# Patient Record
Sex: Female | Born: 1955
Health system: Southern US, Community
[De-identification: ages and names within clinical notes are randomized; demographics above are authoritative.]

## PROBLEM LIST (undated history)

## (undated) DIAGNOSIS — D649 Anemia, unspecified: Secondary | ICD-10-CM

## (undated) DIAGNOSIS — I10 Essential (primary) hypertension: Secondary | ICD-10-CM

## (undated) DIAGNOSIS — R490 Dysphonia: Secondary | ICD-10-CM

## (undated) DIAGNOSIS — E739 Lactose intolerance, unspecified: Secondary | ICD-10-CM

## (undated) DIAGNOSIS — R519 Headache, unspecified: Secondary | ICD-10-CM

## (undated) DIAGNOSIS — M4802 Spinal stenosis, cervical region: Secondary | ICD-10-CM

## (undated) DIAGNOSIS — J45909 Unspecified asthma, uncomplicated: Secondary | ICD-10-CM

## (undated) DIAGNOSIS — R7303 Prediabetes: Secondary | ICD-10-CM

## (undated) DIAGNOSIS — F419 Anxiety disorder, unspecified: Secondary | ICD-10-CM

## (undated) DIAGNOSIS — E8881 Metabolic syndrome: Secondary | ICD-10-CM

## (undated) DIAGNOSIS — R0602 Shortness of breath: Secondary | ICD-10-CM

## (undated) DIAGNOSIS — E785 Hyperlipidemia, unspecified: Secondary | ICD-10-CM

## (undated) DIAGNOSIS — M2550 Pain in unspecified joint: Secondary | ICD-10-CM

## (undated) DIAGNOSIS — E063 Autoimmune thyroiditis: Secondary | ICD-10-CM

## (undated) DIAGNOSIS — Z91018 Allergy to other foods: Secondary | ICD-10-CM

## (undated) DIAGNOSIS — M1990 Unspecified osteoarthritis, unspecified site: Secondary | ICD-10-CM

## (undated) DIAGNOSIS — M8580 Other specified disorders of bone density and structure, unspecified site: Secondary | ICD-10-CM

## (undated) DIAGNOSIS — R002 Palpitations: Secondary | ICD-10-CM

## (undated) DIAGNOSIS — K219 Gastro-esophageal reflux disease without esophagitis: Secondary | ICD-10-CM

## (undated) DIAGNOSIS — T783XXA Angioneurotic edema, initial encounter: Secondary | ICD-10-CM

## (undated) DIAGNOSIS — K5900 Constipation, unspecified: Secondary | ICD-10-CM

## (undated) DIAGNOSIS — M549 Dorsalgia, unspecified: Secondary | ICD-10-CM

## (undated) DIAGNOSIS — K056 Periodontal disease, unspecified: Secondary | ICD-10-CM

## (undated) DIAGNOSIS — K589 Irritable bowel syndrome without diarrhea: Secondary | ICD-10-CM

## (undated) DIAGNOSIS — R1310 Dysphagia, unspecified: Secondary | ICD-10-CM

## (undated) DIAGNOSIS — E669 Obesity, unspecified: Secondary | ICD-10-CM

## (undated) DIAGNOSIS — R5383 Other fatigue: Secondary | ICD-10-CM

## (undated) HISTORY — DX: Dorsalgia, unspecified: M54.9

## (undated) HISTORY — DX: Palpitations: R00.2

## (undated) HISTORY — DX: Headache, unspecified: R51.9

## (undated) HISTORY — DX: Lactose intolerance, unspecified: E73.9

## (undated) HISTORY — DX: Pain in unspecified joint: M25.50

## (undated) HISTORY — DX: Autoimmune thyroiditis: E06.3

## (undated) HISTORY — DX: Gastro-esophageal reflux disease without esophagitis: K21.9

## (undated) HISTORY — DX: Other fatigue: R53.83

## (undated) HISTORY — DX: Metabolic syndrome: E88.81

## (undated) HISTORY — PX: UPPER GI ENDOSCOPY: SHX6162

## (undated) HISTORY — DX: Other specified disorders of bone density and structure, unspecified site: M85.80

## (undated) HISTORY — DX: Constipation, unspecified: K59.00

## (undated) HISTORY — DX: Dysphagia, unspecified: R13.10

## (undated) HISTORY — DX: Allergy to other foods: Z91.018

## (undated) HISTORY — DX: Dysphonia: R49.0

## (undated) HISTORY — DX: Irritable bowel syndrome, unspecified: K58.9

## (undated) HISTORY — DX: Anemia, unspecified: D64.9

## (undated) HISTORY — DX: Angioneurotic edema, initial encounter: T78.3XXA

## (undated) HISTORY — DX: Metabolic syndrome: E88.810

## (undated) HISTORY — DX: Unspecified asthma, uncomplicated: J45.909

## (undated) HISTORY — DX: Anxiety disorder, unspecified: F41.9

## (undated) HISTORY — PX: DILATION AND CURETTAGE OF UTERUS: SHX78

## (undated) HISTORY — DX: Spinal stenosis, cervical region: M48.02

## (undated) HISTORY — DX: Shortness of breath: R06.02

## (undated) HISTORY — PX: WISDOM TOOTH EXTRACTION: SHX21

## (undated) HISTORY — DX: Prediabetes: R73.03

## (undated) HISTORY — DX: Obesity, unspecified: E66.9

## (undated) HISTORY — DX: Periodontal disease, unspecified: K05.6

## (undated) HISTORY — DX: Hyperlipidemia, unspecified: E78.5

## (undated) HISTORY — DX: Unspecified osteoarthritis, unspecified site: M19.90

---

## 1995-03-15 HISTORY — PX: CARDIAC CATHETERIZATION: SHX172

## 1998-01-13 ENCOUNTER — Ambulatory Visit (HOSPITAL_COMMUNITY): Admission: RE | Admit: 1998-01-13 | Discharge: 1998-01-13 | Payer: Self-pay | Admitting: Obstetrics & Gynecology

## 1999-03-18 ENCOUNTER — Ambulatory Visit (HOSPITAL_COMMUNITY): Admission: RE | Admit: 1999-03-18 | Discharge: 1999-03-18 | Payer: Self-pay | Admitting: Obstetrics & Gynecology

## 1999-03-18 ENCOUNTER — Encounter: Payer: Self-pay | Admitting: Obstetrics & Gynecology

## 1999-05-04 ENCOUNTER — Other Ambulatory Visit: Admission: RE | Admit: 1999-05-04 | Discharge: 1999-05-04 | Payer: Self-pay | Admitting: Obstetrics & Gynecology

## 2000-04-21 ENCOUNTER — Ambulatory Visit (HOSPITAL_COMMUNITY): Admission: RE | Admit: 2000-04-21 | Discharge: 2000-04-21 | Payer: Self-pay | Admitting: Obstetrics & Gynecology

## 2000-04-21 ENCOUNTER — Encounter: Payer: Self-pay | Admitting: Obstetrics & Gynecology

## 2000-06-01 ENCOUNTER — Other Ambulatory Visit: Admission: RE | Admit: 2000-06-01 | Discharge: 2000-06-01 | Payer: Self-pay | Admitting: Obstetrics & Gynecology

## 2000-06-02 ENCOUNTER — Ambulatory Visit (HOSPITAL_COMMUNITY): Admission: RE | Admit: 2000-06-02 | Discharge: 2000-06-02 | Payer: Self-pay | Admitting: Internal Medicine

## 2000-10-30 ENCOUNTER — Encounter: Admission: RE | Admit: 2000-10-30 | Discharge: 2000-10-30 | Payer: Self-pay | Admitting: Internal Medicine

## 2001-04-13 ENCOUNTER — Encounter: Payer: Self-pay | Admitting: Obstetrics & Gynecology

## 2001-04-13 ENCOUNTER — Ambulatory Visit (HOSPITAL_COMMUNITY): Admission: RE | Admit: 2001-04-13 | Discharge: 2001-04-13 | Payer: Self-pay | Admitting: Obstetrics & Gynecology

## 2001-05-07 ENCOUNTER — Ambulatory Visit (HOSPITAL_COMMUNITY): Admission: RE | Admit: 2001-05-07 | Discharge: 2001-05-07 | Payer: Self-pay | Admitting: Internal Medicine

## 2001-05-08 ENCOUNTER — Ambulatory Visit (HOSPITAL_COMMUNITY): Admission: RE | Admit: 2001-05-08 | Discharge: 2001-05-08 | Payer: Self-pay | Admitting: Internal Medicine

## 2001-05-08 ENCOUNTER — Encounter: Payer: Self-pay | Admitting: Internal Medicine

## 2001-09-18 ENCOUNTER — Other Ambulatory Visit: Admission: RE | Admit: 2001-09-18 | Discharge: 2001-09-18 | Payer: Self-pay | Admitting: Obstetrics & Gynecology

## 2001-11-26 ENCOUNTER — Encounter: Payer: Self-pay | Admitting: Internal Medicine

## 2001-11-26 ENCOUNTER — Encounter: Admission: RE | Admit: 2001-11-26 | Discharge: 2001-11-26 | Payer: Self-pay | Admitting: Internal Medicine

## 2002-05-22 ENCOUNTER — Ambulatory Visit (HOSPITAL_COMMUNITY): Admission: RE | Admit: 2002-05-22 | Discharge: 2002-05-22 | Payer: Self-pay | Admitting: Obstetrics & Gynecology

## 2002-05-22 ENCOUNTER — Encounter: Payer: Self-pay | Admitting: Obstetrics & Gynecology

## 2003-01-01 ENCOUNTER — Other Ambulatory Visit: Admission: RE | Admit: 2003-01-01 | Discharge: 2003-01-01 | Payer: Self-pay | Admitting: Obstetrics & Gynecology

## 2003-08-19 ENCOUNTER — Ambulatory Visit (HOSPITAL_COMMUNITY): Admission: RE | Admit: 2003-08-19 | Discharge: 2003-08-19 | Payer: Self-pay | Admitting: Obstetrics & Gynecology

## 2004-02-17 ENCOUNTER — Other Ambulatory Visit: Admission: RE | Admit: 2004-02-17 | Discharge: 2004-02-17 | Payer: Self-pay | Admitting: Obstetrics & Gynecology

## 2004-08-26 ENCOUNTER — Ambulatory Visit: Payer: Self-pay | Admitting: Family Medicine

## 2004-09-13 ENCOUNTER — Ambulatory Visit: Payer: Self-pay | Admitting: Internal Medicine

## 2005-02-01 ENCOUNTER — Ambulatory Visit (HOSPITAL_COMMUNITY): Admission: RE | Admit: 2005-02-01 | Discharge: 2005-02-01 | Payer: Self-pay | Admitting: Obstetrics & Gynecology

## 2005-03-10 ENCOUNTER — Other Ambulatory Visit: Admission: RE | Admit: 2005-03-10 | Discharge: 2005-03-10 | Payer: Self-pay | Admitting: Obstetrics & Gynecology

## 2005-03-14 HISTORY — PX: COLONOSCOPY: SHX174

## 2005-07-14 ENCOUNTER — Ambulatory Visit: Payer: Self-pay | Admitting: Internal Medicine

## 2005-08-09 ENCOUNTER — Ambulatory Visit: Payer: Self-pay | Admitting: Internal Medicine

## 2005-09-03 ENCOUNTER — Emergency Department (HOSPITAL_COMMUNITY): Admission: EM | Admit: 2005-09-03 | Discharge: 2005-09-03 | Payer: Self-pay | Admitting: Emergency Medicine

## 2006-01-21 ENCOUNTER — Ambulatory Visit: Payer: Self-pay | Admitting: Family Medicine

## 2006-01-24 ENCOUNTER — Ambulatory Visit: Payer: Self-pay | Admitting: Internal Medicine

## 2006-01-24 LAB — CONVERTED CEMR LAB
ALT: 17 units/L (ref 0–40)
Alkaline Phosphatase: 112 units/L (ref 39–117)
Basophils Relative: 0.4 % (ref 0.0–1.0)
Calcium: 9.4 mg/dL (ref 8.4–10.5)
Chloride: 103 meq/L (ref 96–112)
Creatinine, Ser: 0.7 mg/dL (ref 0.4–1.2)
Eosinophil percent: 0.1 % (ref 0.0–5.0)
Glomerular Filtration Rate, Af Am: 114 mL/min/{1.73_m2}
Glucose, Bld: 77 mg/dL (ref 70–99)
Neutrophils Relative %: 79.7 % — ABNORMAL HIGH (ref 43.0–77.0)
Platelets: 365 10*3/uL (ref 150–400)
Potassium: 3.3 meq/L — ABNORMAL LOW (ref 3.5–5.1)
RDW: 14.4 % (ref 11.5–14.6)
Total Protein: 6.9 g/dL (ref 6.0–8.3)
WBC: 10.4 10*3/uL (ref 4.5–10.5)

## 2006-02-01 ENCOUNTER — Encounter: Admission: RE | Admit: 2006-02-01 | Discharge: 2006-02-01 | Payer: Self-pay | Admitting: Internal Medicine

## 2006-02-06 ENCOUNTER — Ambulatory Visit (HOSPITAL_COMMUNITY): Admission: RE | Admit: 2006-02-06 | Discharge: 2006-02-06 | Payer: Self-pay | Admitting: Obstetrics & Gynecology

## 2006-02-17 ENCOUNTER — Ambulatory Visit: Payer: Self-pay | Admitting: Internal Medicine

## 2006-02-20 ENCOUNTER — Ambulatory Visit: Payer: Self-pay | Admitting: Internal Medicine

## 2006-04-26 ENCOUNTER — Ambulatory Visit: Payer: Self-pay | Admitting: Internal Medicine

## 2006-04-26 LAB — CONVERTED CEMR LAB
ALT: 19 units/L (ref 0–40)
AST: 24 units/L (ref 0–37)
Total CHOL/HDL Ratio: 3.7
VLDL: 35 mg/dL (ref 0–40)

## 2006-07-31 ENCOUNTER — Encounter: Payer: Self-pay | Admitting: Internal Medicine

## 2006-08-21 ENCOUNTER — Encounter: Payer: Self-pay | Admitting: Internal Medicine

## 2006-08-21 ENCOUNTER — Encounter (INDEPENDENT_AMBULATORY_CARE_PROVIDER_SITE_OTHER): Payer: Self-pay | Admitting: Gastroenterology

## 2006-08-21 ENCOUNTER — Ambulatory Visit (HOSPITAL_COMMUNITY): Admission: RE | Admit: 2006-08-21 | Discharge: 2006-08-21 | Payer: Self-pay | Admitting: Gastroenterology

## 2007-01-29 ENCOUNTER — Telehealth (INDEPENDENT_AMBULATORY_CARE_PROVIDER_SITE_OTHER): Payer: Self-pay | Admitting: *Deleted

## 2007-02-20 ENCOUNTER — Encounter: Payer: Self-pay | Admitting: Internal Medicine

## 2007-02-20 ENCOUNTER — Ambulatory Visit (HOSPITAL_COMMUNITY): Admission: RE | Admit: 2007-02-20 | Discharge: 2007-02-20 | Payer: Self-pay | Admitting: Obstetrics & Gynecology

## 2007-03-12 ENCOUNTER — Telehealth (INDEPENDENT_AMBULATORY_CARE_PROVIDER_SITE_OTHER): Payer: Self-pay | Admitting: *Deleted

## 2007-06-05 ENCOUNTER — Ambulatory Visit: Payer: Self-pay | Admitting: Internal Medicine

## 2007-06-05 DIAGNOSIS — E7800 Pure hypercholesterolemia, unspecified: Secondary | ICD-10-CM | POA: Insufficient documentation

## 2007-06-05 DIAGNOSIS — J45909 Unspecified asthma, uncomplicated: Secondary | ICD-10-CM | POA: Insufficient documentation

## 2007-06-05 DIAGNOSIS — K219 Gastro-esophageal reflux disease without esophagitis: Secondary | ICD-10-CM | POA: Insufficient documentation

## 2007-06-05 DIAGNOSIS — E785 Hyperlipidemia, unspecified: Secondary | ICD-10-CM | POA: Insufficient documentation

## 2007-06-05 LAB — CONVERTED CEMR LAB
HDL goal, serum: 40 mg/dL
LDL Goal: 160 mg/dL

## 2007-06-06 LAB — CONVERTED CEMR LAB
AST: 28 units/L (ref 0–37)
CO2: 32 meq/L (ref 19–32)
Chloride: 106 meq/L (ref 96–112)
Creatinine, Ser: 0.7 mg/dL (ref 0.4–1.2)
Eosinophils Relative: 2.1 % (ref 0.0–5.0)
GFR calc non Af Amer: 93 mL/min
Glucose, Bld: 91 mg/dL (ref 70–99)
HCT: 36.2 % (ref 36.0–46.0)
Hemoglobin: 11.8 g/dL — ABNORMAL LOW (ref 12.0–15.0)
Lymphocytes Relative: 17 % (ref 12.0–46.0)
MCHC: 32.5 g/dL (ref 30.0–36.0)
Monocytes Relative: 7.8 % (ref 3.0–11.0)
Potassium: 3.9 meq/L (ref 3.5–5.1)
RDW: 13.2 % (ref 11.5–14.6)
Total Bilirubin: 0.8 mg/dL (ref 0.3–1.2)
WBC: 5.8 10*3/uL (ref 4.5–10.5)

## 2007-06-07 ENCOUNTER — Encounter (INDEPENDENT_AMBULATORY_CARE_PROVIDER_SITE_OTHER): Payer: Self-pay | Admitting: *Deleted

## 2007-06-11 ENCOUNTER — Telehealth (INDEPENDENT_AMBULATORY_CARE_PROVIDER_SITE_OTHER): Payer: Self-pay | Admitting: *Deleted

## 2007-09-06 ENCOUNTER — Ambulatory Visit: Payer: Self-pay | Admitting: Internal Medicine

## 2007-09-15 LAB — CONVERTED CEMR LAB
Cholesterol: 191 mg/dL (ref 0–200)
HDL: 51.3 mg/dL (ref >39.0)
LDL Cholesterol: 109 mg/dL — ABNORMAL HIGH (ref 0–99)
Total CHOL/HDL Ratio: 3.7
Triglycerides: 155 mg/dL — ABNORMAL HIGH (ref 0–149)
VLDL: 31 mg/dL (ref 0–40)

## 2007-09-18 ENCOUNTER — Telehealth (INDEPENDENT_AMBULATORY_CARE_PROVIDER_SITE_OTHER): Payer: Self-pay | Admitting: *Deleted

## 2007-09-19 ENCOUNTER — Encounter (INDEPENDENT_AMBULATORY_CARE_PROVIDER_SITE_OTHER): Payer: Self-pay | Admitting: *Deleted

## 2007-09-19 ENCOUNTER — Telehealth (INDEPENDENT_AMBULATORY_CARE_PROVIDER_SITE_OTHER): Payer: Self-pay | Admitting: *Deleted

## 2007-11-01 ENCOUNTER — Ambulatory Visit: Payer: Self-pay | Admitting: Internal Medicine

## 2007-12-04 ENCOUNTER — Ambulatory Visit: Payer: Self-pay | Admitting: Internal Medicine

## 2007-12-21 ENCOUNTER — Encounter (INDEPENDENT_AMBULATORY_CARE_PROVIDER_SITE_OTHER): Payer: Self-pay | Admitting: *Deleted

## 2007-12-21 LAB — CONVERTED CEMR LAB
Triglycerides: 142 mg/dL (ref 0–149)
VLDL: 28 mg/dL (ref 0–40)

## 2008-01-07 ENCOUNTER — Telehealth (INDEPENDENT_AMBULATORY_CARE_PROVIDER_SITE_OTHER): Payer: Self-pay | Admitting: *Deleted

## 2008-01-22 ENCOUNTER — Telehealth (INDEPENDENT_AMBULATORY_CARE_PROVIDER_SITE_OTHER): Payer: Self-pay | Admitting: *Deleted

## 2008-04-17 ENCOUNTER — Ambulatory Visit (HOSPITAL_COMMUNITY): Admission: RE | Admit: 2008-04-17 | Discharge: 2008-04-17 | Payer: Self-pay | Admitting: Obstetrics & Gynecology

## 2008-05-02 ENCOUNTER — Telehealth (INDEPENDENT_AMBULATORY_CARE_PROVIDER_SITE_OTHER): Payer: Self-pay | Admitting: *Deleted

## 2008-06-19 ENCOUNTER — Telehealth (INDEPENDENT_AMBULATORY_CARE_PROVIDER_SITE_OTHER): Payer: Self-pay | Admitting: *Deleted

## 2008-08-13 ENCOUNTER — Telehealth (INDEPENDENT_AMBULATORY_CARE_PROVIDER_SITE_OTHER): Payer: Self-pay | Admitting: *Deleted

## 2008-08-26 ENCOUNTER — Ambulatory Visit: Payer: Self-pay | Admitting: Internal Medicine

## 2008-08-26 DIAGNOSIS — R030 Elevated blood-pressure reading, without diagnosis of hypertension: Secondary | ICD-10-CM | POA: Insufficient documentation

## 2008-08-30 LAB — CONVERTED CEMR LAB
AST: 28 units/L (ref 0–37)
Albumin: 3.8 g/dL (ref 3.5–5.2)
BUN: 13 mg/dL (ref 6–23)
Bilirubin, Direct: 0.1 mg/dL (ref 0.0–0.3)
CO2: 28 meq/L (ref 19–32)
Calcium: 9.3 mg/dL (ref 8.4–10.5)
Cholesterol: 166 mg/dL (ref 0–200)
Creatinine, Ser: 0.6 mg/dL (ref 0.4–1.2)
Eosinophils Absolute: 0.2 10*3/uL (ref 0.0–0.7)
HCT: 37.6 % (ref 36.0–46.0)
LDL Cholesterol: 83 mg/dL (ref 0–99)
Lymphocytes Relative: 15.5 % (ref 12.0–46.0)
Lymphs Abs: 1.1 10*3/uL (ref 0.7–4.0)
MCV: 81.6 fL (ref 78.0–100.0)
Neutro Abs: 5 10*3/uL (ref 1.4–7.7)
TSH: 2.93 microintl units/mL (ref 0.35–5.50)
Total Bilirubin: 0.8 mg/dL (ref 0.3–1.2)
Total CHOL/HDL Ratio: 3
VLDL: 30.2 mg/dL (ref 0.0–40.0)

## 2008-09-01 ENCOUNTER — Encounter (INDEPENDENT_AMBULATORY_CARE_PROVIDER_SITE_OTHER): Payer: Self-pay | Admitting: *Deleted

## 2009-05-11 ENCOUNTER — Ambulatory Visit: Payer: Self-pay | Admitting: Internal Medicine

## 2009-05-16 ENCOUNTER — Ambulatory Visit: Payer: Self-pay | Admitting: Family Medicine

## 2009-05-18 ENCOUNTER — Ambulatory Visit: Payer: Self-pay | Admitting: Internal Medicine

## 2009-05-19 ENCOUNTER — Ambulatory Visit (HOSPITAL_COMMUNITY): Admission: RE | Admit: 2009-05-19 | Discharge: 2009-05-19 | Payer: Self-pay | Admitting: Obstetrics & Gynecology

## 2009-09-04 ENCOUNTER — Telehealth (INDEPENDENT_AMBULATORY_CARE_PROVIDER_SITE_OTHER): Payer: Self-pay | Admitting: *Deleted

## 2009-09-30 ENCOUNTER — Telehealth (INDEPENDENT_AMBULATORY_CARE_PROVIDER_SITE_OTHER): Payer: Self-pay | Admitting: *Deleted

## 2009-12-02 ENCOUNTER — Ambulatory Visit: Payer: Self-pay | Admitting: Internal Medicine

## 2009-12-02 DIAGNOSIS — R059 Cough, unspecified: Secondary | ICD-10-CM | POA: Insufficient documentation

## 2009-12-02 DIAGNOSIS — R05 Cough: Secondary | ICD-10-CM

## 2009-12-02 LAB — CONVERTED CEMR LAB
Albumin: 3.9 g/dL (ref 3.5–5.2)
BUN: 14 mg/dL (ref 6–23)
Basophils Absolute: 0 10*3/uL (ref 0.0–0.1)
Bilirubin, Direct: 0.1 mg/dL (ref 0.0–0.3)
Calcium: 9.7 mg/dL (ref 8.4–10.5)
Creatinine, Ser: 0.7 mg/dL (ref 0.4–1.2)
GFR calc non Af Amer: 95.65 mL/min (ref >60)
Glucose, Bld: 92 mg/dL (ref 70–99)
HCT: 38.4 % (ref 36.0–46.0)
Hemoglobin: 12.9 g/dL (ref 12.0–15.0)
LDL Cholesterol: 86 mg/dL (ref 0–99)
Lymphocytes Relative: 16.7 % (ref 12.0–46.0)
Lymphs Abs: 1.2 10*3/uL (ref 0.7–4.0)
MCV: 84.1 fL (ref 78.0–100.0)
Monocytes Absolute: 0.6 10*3/uL (ref 0.1–1.0)
Neutro Abs: 5.4 10*3/uL (ref 1.4–7.7)
Platelets: 317 10*3/uL (ref 150.0–400.0)
TSH: 2.43 microintl units/mL (ref 0.35–5.50)
Total CHOL/HDL Ratio: 3
WBC: 7.3 10*3/uL (ref 4.5–10.5)

## 2010-04-13 NOTE — Assessment & Plan Note (Signed)
Summary: wheezing/for a week/kdc   Vital Signs:  Patient profile:   55 year old female Weight:      209.2 pounds O2 Sat:      96 % Temp:     98.4 degrees F oral Pulse rate:   81 / minute Resp:     17 per minute BP sitting:   138 / 98  (left arm) Cuff size:   large  Vitals Entered By: Shonna Chock (May 18, 2009 1:55 PM) CC: Wheezing and cough. Seen dr.Paz last Monday and Dr.Copland [facility]-symptoms still ongoing Comments REVIEWED MED LIST, PATIENT AGREED DOSE AND INSTRUCTION CORRECT    CC:  Wheezing and cough. Seen dr.Paz last Monday and Dr.Copland @ [facility]-symptoms still ongoing.  History of Present Illness: Onset 9-10 days ago after viral picture; she had Flu shot 11/2008. On second round of Prednisone ; but she has marked DOE. On Pulmicort 2 puffs once daily in am. Proventil MDI every 4 hrs as needed .RX: OTC cough syrup ,Claritin  Allergies: 1)  ! Pcn  Review of Systems General:  Complains of sweats; denies chills and fever. ENT:  Complains of nasal congestion and postnasal drainage; denies difficulty swallowing, ear discharge, earache, hoarseness, and sinus pressure; No frontal headache, facial pain or purulence. CV:  Complains of difficulty breathing at night and difficulty breathing while lying down; Dyspnea only with paroxysmal coughing . Resp:  Complains of wheezing; denies chest pain with inspiration, coughing up blood, and sputum productive. GI:  Denies indigestion. Allergy:  Denies itching eyes and sneezing.  Physical Exam  General:  in no acute distress; alert,appropriate and cooperative throughout examination Ears:  External ear exam shows no significant lesions or deformities.  Otoscopic examination reveals clear canals, tympanic membranes are intact bilaterally without bulging, retraction, inflammation or discharge. Hearing is grossly normal bilaterally. Nose:  External nasal examination shows no deformity or inflammation. Nasal mucosa are  pink and moist without lesions or exudates. Mouth:  Oral mucosa and oropharynx without lesions or exudates.  Teeth in good repair. Lungs:  Normal respiratory effort, chest expands symmetrically. Lungs : symmetric coarse rhonchi &  wheezes.Brassy , harsh cough Heart:  Normal rate and regular rhythm. S1 and S2 normal without gallop, murmur, click, rub. S4 Extremities:  No clubbing, cyanosis, edema. Skin:  Intact without suspicious lesions or rashes Cervical Nodes:  No lymphadenopathy noted Axillary Nodes:  No palpable lymphadenopathy   Impression & Recommendations:  Problem # 1:  ASTHMA, WITH ACUTE EXACERBATION (ICD-493.92)  The following medications were removed from the medication list:    Pulmicort Flexhaler 180 Mcg/act Inha (Budesonide (inhalation)) ..... Use 1 to 2 inhalations q 12 hours Her updated medication list for this problem includes:    Proventil Hfa 108 (90 Base) Mcg/act Aers (Albuterol sulfate) .Marland Kitchen... 1-2 puffs every 6 hours as needed    Prednisone 10 Mg Tabs (Prednisone) .Marland KitchenMarland KitchenMarland KitchenMarland Kitchen 4 tabs by mouth x 4 days, then 3 tabs by mouth for 2 days, then 2 tabs for 2 days, then 1 tab for 2 days    Symbicort 160-4.5 Mcg/act Aero (Budesonide-formoterol fumarate) .Marland Kitchen... 1-2 puffs every 12 hrs ; gargle & spit after use    Singulair 10 Mg Tabs (Montelukast sodium) .Marland Kitchen... 1 once daily  Problem # 2:  BRONCHITIS- ACUTE (ICD-466.0)  The following medications were removed from the medication list:    Pulmicort Flexhaler 180 Mcg/act Inha (Budesonide (inhalation)) ..... Use 1 to 2 inhalations q 12 hours    Zithromax Z-pak 250  Mg Tabs (Azithromycin) .Marland Kitchen... As directed Her updated medication list for this problem includes:    Proventil Hfa 108 (90 Base) Mcg/act Aers (Albuterol sulfate) .Marland Kitchen... 1-2 puffs every 6 hours as needed    Symbicort 160-4.5 Mcg/act Aero (Budesonide-formoterol fumarate) .Marland Kitchen... 1-2 puffs every 12 hrs ; gargle & spit after use    Singulair 10 Mg Tabs (Montelukast sodium) .Marland Kitchen... 1 once  daily    Avelox Abc Pack 400 Mg Tabs (Moxifloxacin hcl) .Marland Kitchen... 1 once daily if purulent sputum  Complete Medication List: 1)  Fluticasone Propionate 50 Mcg/act Susp (Fluticasone propionate) .Marland Kitchen.. 1 spray each nostril daily 2)  Proventil Hfa 108 (90 Base) Mcg/act Aers (Albuterol sulfate) .Marland Kitchen.. 1-2 puffs every 6 hours as needed 3)  Calcium 1200mg   .... 1 by mouth once daily 4)  Nexium 40 Mg Cpdr (Esomeprazole magnesium) .Marland Kitchen.. 1 by mouth once daily as needed 5)  Fluoxetine Hcl 10 Mg Tabs (Fluoxetine hcl) .Marland Kitchen.. 1 by mouth once daily 6)  Baby Asa  7)  Vytorin 10-20 Mg Tabs (Ezetimibe-simvastatin) .Marland Kitchen.. 1 qhs 8)  Ambien 5 Mg Tabs (Zolpidem tartrate) .Marland Kitchen.. 1 by mouth at bedtime as needed 9)  Vitamin C 500 Mg Tabs (Ascorbic acid) .Marland Kitchen.. 1 by mouth once daily 10)  Acyclovir Sodium 1000 Mg Solr (Acyclovir sodium) .Marland Kitchen.. 1 by mouth once daily as needed 11)  Loratadine 10 Mg Tabs (Loratadine) .Marland Kitchen.. 1 by mouth once daily as needed 12)  Hydrochlorothiazide 12.5 Mg Caps (Hydrochlorothiazide) .Marland Kitchen.. 1 once daily 13)  Prednisone 10 Mg Tabs (Prednisone) .... 4 tabs by mouth x 4 days, then 3 tabs by mouth for 2 days, then 2 tabs for 2 days, then 1 tab for 2 days 14)  Symbicort 160-4.5 Mcg/act Aero (Budesonide-formoterol fumarate) .Marland Kitchen.. 1-2 puffs every 12 hrs ; gargle & spit after use 15)  Singulair 10 Mg Tabs (Montelukast sodium) .Marland Kitchen.. 1 once daily 16)  Avelox Abc Pack 400 Mg Tabs (Moxifloxacin hcl) .Marland Kitchen.. 1 once daily if purulent sputum  Patient Instructions: 1)  Drink as much fluid as you can tolerate for the next few days. Prescriptions: AVELOX ABC PACK 400 MG TABS (MOXIFLOXACIN HCL) 1 once daily if purulent sputum  #5 x 0   Entered and Authorized by:   Marga Melnick MD   Signed by:   Marga Melnick MD on 05/18/2009   Method used:   Samples Given   RxID:   [id number] SINGULAIR 10 MG TABS (MONTELUKAST SODIUM) 1 once daily  #21 x 0   Entered and Authorized by:   Marga Melnick MD   Signed by:   Marga Melnick MD  on 05/18/2009   Method used:   Samples Given   RxID:   [id number] SYMBICORT 160-4.5 MCG/ACT AERO (BUDESONIDE-FORMOTEROL FUMARATE) 1-2 puffs every 12 hrs ; gargle & spit after use  #1 x 11   Entered and Authorized by:   Marga Melnick MD   Signed by:   Marga Melnick MD on 05/18/2009   Method used:   Print then Give to Patient   RxID:   [id number]

## 2010-04-13 NOTE — Progress Notes (Signed)
Summary: REFILL  Phone Note Refill Request Message from:  Fax from Pharmacy on September 30, 2009 3:46 PM  Refills Requested: Medication #1:  ALBUTEROL MDI [facility] [phone] [facility]EL [phone] NOTE FROM PHARMACY "LAST RX OVER 2 YEARS AGE NEEDS NEW RX ORDER"  Initial call taken by: Okey Regal [place],  September 30, 2009 3:47 PM  Follow-up for Phone Call        Patient has Proventil and Symbicort on med list already. Please advise. Lucious Groves CMA  September 30, 2009 3:51 PM   Additional Follow-up for Phone Call Additional follow up Details #1::        #1 albuterol MDI, RX 2 Additional Follow-up by: Marga Melnick MD,  September 30, 2009 4:33 PM    Prescriptions: PROVENTIL HFA 108 (90 BASE) MCG/ACT AERS (ALBUTEROL SULFATE) 1-2 puffs every 6 hours as needed  #1 x 2   Entered by:   Shonna Chock CMA   Authorized by:   Marga Melnick MD   Signed by:   Shonna Chock CMA on 10/01/2009   Method used:   Electronically to        [facility]  [phone]* (retail)       [address] [place], [place]  [address]       Ph: [phone] or [phone]       Fax: [phone]   RxID:   [id number]

## 2010-04-13 NOTE — Assessment & Plan Note (Signed)
Summary: FEVER,CHILLS,BODY ACHES/RH......   Vital Signs:  Patient profile:   55 year old female Height:      65 inches Weight:      209.4 pounds Temp:     99.3 degrees F BP sitting:   110 / 80  Vitals Entered By: Shary Decamp (May 11, 2009 4:02 PM) CC: acute only Comments  - sick x couple days  - chest, nasal congestion  - cough  - fever, chills, achy  - sinus pain, pressure  - using tyl, rob dm   History of Present Illness: as above  Current Medications (verified): 1)  Pulmicort Flexhaler 180 Mcg/act  Inha (Budesonide (Inhalation)) .... Use 1 To 2 Inhalations Q 12 Hours 2)  Fluticasone Propionate 50 Mcg/act Susp (Fluticasone Propionate) .Marland Kitchen.. 1 Spray Each Nostril Daily 3)  Proventil Hfa 108 (90 Base) Mcg/act Aers (Albuterol Sulfate) .Marland Kitchen.. 1-2 Puffs Every 6 Hours As Needed 4)  Calcium 1200mg  .... 1 By Mouth Once Daily 5)  Nexium 40 Mg Cpdr (Esomeprazole Magnesium) .Marland Kitchen.. 1 By Mouth Once Daily As Needed 6)  Fluoxetine Hcl 10 Mg  Tabs (Fluoxetine Hcl) .Marland Kitchen.. 1 By Mouth Once Daily 7)  Baby Asa 8)  Vytorin 10-20 Mg  Tabs (Ezetimibe-Simvastatin) .Marland Kitchen.. 1 Qhs 9)  Ambien 5 Mg Tabs (Zolpidem Tartrate) .Marland Kitchen.. 1 By Mouth At Bedtime As Needed 10)  Vitamin C 500 Mg Tabs (Ascorbic Acid) .Marland Kitchen.. 1 By Mouth Once Daily 11)  Acyclovir Sodium 1000 Mg Solr (Acyclovir Sodium) .Marland Kitchen.. 1 By Mouth Once Daily As Needed 12)  Loratadine 10 Mg Tabs (Loratadine) .Marland Kitchen.. 1 By Mouth Once Daily As Needed 13)  Hydrochlorothiazide 12.5 Mg Caps (Hydrochlorothiazide) .Marland Kitchen.. 1 Once Daily  Allergies (verified): 1)  ! Pcn  Past History:  Past Medical History: h/o  elevated alkalyne phosphatase  Asthma GERD Hyperlipidemia  Past Surgical History: Reviewed history from 08/26/2008 and no changes required. G3 P2 A1; D&C X1 endoscopy: Candida  in esophagus 1991 ; endo neg 2008  Colonoscopy 2008: neg Dr Matthias Hughs, due 2018; Oral Surgery (wisdom teeth)  Social History: Reviewed history from 08/26/2008 and no changes  required. 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   Head:  face symetric, no TTP Nose:  no congested  Mouth:  no red or d/c  Lungs:  normal respiratory effort, no intercostal retractions, and no accessory muscle use.  + ronchi B and increased exp. time    Impression & Recommendations:  Problem # 1:  ASTHMA (ICD-493.90) asthma exhacerbated by recent viral illnes see instructions  Her updated medication list for this problem includes:    Pulmicort Flexhaler 180 Mcg/act Inha (Budesonide (inhalation)) ..... Use 1 to 2 inhalations q 12 hours    Proventil Hfa 108 (90 Base) Mcg/act Aers (Albuterol sulfate) .Marland Kitchen... 1-2 puffs every 6 hours as needed    Prednisone 10 Mg Tabs (Prednisone) .Marland KitchenMarland KitchenMarland KitchenMarland Kitchen 3 by mouth once daily x 2, 2 by mouth once daily x 2 , 1 x 2  Complete Medication List: 1)  Pulmicort Flexhaler 180 Mcg/act Inha (Budesonide (inhalation)) .... Use 1 to 2 inhalations q 12 hours 2)  Fluticasone Propionate 50 Mcg/act Susp (Fluticasone propionate) .Marland Kitchen.. 1 spray each nostril daily 3)  Proventil Hfa 108 (90 Base) Mcg/act Aers (  Albuterol sulfate) .Marland Kitchen.. 1-2 puffs every 6 hours as needed 4)  Calcium 1200mg   .... 1 by mouth once daily 5)  Nexium 40 Mg Cpdr (Esomeprazole magnesium) .Marland Kitchen.. 1 by mouth once daily as needed 6)  Fluoxetine Hcl 10 Mg Tabs (Fluoxetine hcl) .Marland Kitchen.. 1 by mouth once daily 7)  Baby Asa  8)  Vytorin 10-20 Mg Tabs (Ezetimibe-simvastatin) .Marland Kitchen.. 1 qhs 9)  Ambien 5 Mg Tabs (Zolpidem tartrate) .Marland Kitchen.. 1 by mouth at bedtime as needed 10)  Vitamin C 500 Mg Tabs (Ascorbic acid) .Marland Kitchen.. 1 by mouth once daily 11)  Acyclovir Sodium 1000 Mg Solr (Acyclovir sodium) .Marland Kitchen.. 1 by mouth once daily as needed 12)  Loratadine 10 Mg Tabs (Loratadine)  .Marland Kitchen.. 1 by mouth once daily as needed 13)  Hydrochlorothiazide 12.5 Mg Caps (Hydrochlorothiazide) .Marland Kitchen.. 1 once daily 14)  Zithromax Z-pak 250 Mg Tabs (Azithromycin) .... As directed 15)  Prednisone 10 Mg Tabs (Prednisone) .... 3 by mouth once daily x 2, 2 by mouth once daily x 2 , 1 x 2  Patient Instructions: 1)  Get plenty of rest, drink lots of clear liquids, and use Tylenol 2)  Robitussin DM 3)  Zpack 4)  Prednisone for few days  5)  Return in 7-10 days if you're not better: sooner if you'er feeling worse.  Prescriptions: PREDNISONE 10 MG TABS (PREDNISONE) 3 by mouth once daily x 2, 2 by mouth once daily x 2 , 1 x 2  #12 x 0   Entered and Authorized by:   Nolon Rod. Etienne Mowers MD   Signed by:   Nolon Rod. Alvena Kiernan MD on 05/11/2009   Method used:   Electronically to        [facility]  [phone]* (retail)       [address] [place], [place]  [address]       Ph: [phone] or [phone]       Fax: [phone]   RxID:   [id number] ZITHROMAX Z-PAK 250 MG TABS (AZITHROMYCIN) as directed  #1 x 0   Entered and Authorized by:   Nolon Rod. Joeph Szatkowski MD   Signed by:   Nolon Rod. Elizbeth Posa MD on 05/11/2009   Method used:   Electronically to        [facility]  [phone]* (retail)       [address] [place], [place]  [address]       Ph: [phone] or [phone]       Fax: [phone]   RxID:   [id number]

## 2010-04-13 NOTE — Assessment & Plan Note (Signed)
Summary: cpx/lab/cbs   Vital Signs:  Patient profile:   55 year old female Height:      65 inches Weight:      208.4 pounds BMI:     34.80 Temp:     98.4 degrees F oral Pulse rate:   72 / minute Resp:     16 per minute BP sitting:   110 / 68  (left arm) Cuff size:   large  Vitals Entered By: Shonna Chock CMA (December 02, 2009 8:44 AM) CC: CPX with fasting labs , Lipid Management   CC:  CPX with fasting labs  and Lipid Management.  History of Present Illness: Dana Mccoy is here for a physical; she is essentially asymptomatic except for an intermittent cough & throat clearing in context of a "globus " sensation. She is not on an ACE-I .      She also  presents for Hyperlipidemia follow-up.  The patient denies muscle aches, GI upset, abdominal pain, flushing, itching, constipation, diarrhea, and fatigue.  Other symptoms include pedal edema with  increased salt intake.  The patient denies the following symptoms: chest pain/pressure, exercise intolerance, dypsnea, palpitations, and syncope.  Compliance with medications (by patient report) has been near 100%.  Dietary compliance has been good.  The patient reports exercising 3-4X per week.  Adjunctive measures currently used by the patient include ASA.    Lipid Management History:      Negative NCEP/ATP III risk factors include female age less than 57 years old, no history of early menopause without estrogen hormone replacement, non-diabetic, no family history for ischemic heart disease, non-tobacco-user status, non-hypertensive, no ASHD (atherosclerotic heart disease), no prior stroke/TIA, no peripheral vascular disease, and no history of aortic aneurysm.     Current Medications (verified): 1)  Fluticasone Propionate 50 Mcg/act Susp (Fluticasone Propionate) .Marland Kitchen.. 1 Spray Each Nostril Daily 2)  Proventil Hfa 108 (90 Base) Mcg/act Aers (Albuterol Sulfate) .Marland Kitchen.. 1-2 Puffs Every 6 Hours As Needed 3)  Calcium 1200mg  .... 1 By Mouth Once Daily 4)   Nexium 40 Mg Cpdr (Esomeprazole Magnesium) .Marland Kitchen.. 1 By Mouth Once Daily As Needed 5)  Fluoxetine Hcl 10 Mg  Tabs (Fluoxetine Hcl) .Marland Kitchen.. 1 By Mouth Once Daily 6)  Baby Asa 7)  Vytorin 10-20 Mg  Tabs (Ezetimibe-Simvastatin) .Marland Kitchen.. 1 Qhs 8)  Ambien 5 Mg Tabs (Zolpidem Tartrate) .Marland Kitchen.. 1 By Mouth At Bedtime As Needed 9)  Vitamin C 500 Mg Tabs (Ascorbic Acid) .... 2 By Mouth Once Daily 10)  Acyclovir Sodium 500 Mg Solr (Acyclovir Sodium) .Marland Kitchen.. 1 By Mouth Once Daily As Needed 11)  Loratadine 10 Mg Tabs (Loratadine) .Marland Kitchen.. 1 By Mouth Once Daily As Needed 12)  Hydrochlorothiazide 12.5 Mg Caps (Hydrochlorothiazide) .Marland Kitchen.. 1 Once Daily 13)  Symbicort 160-4.5 Mcg/act Aero (Budesonide-Formoterol Fumarate) .Marland Kitchen.. 1-2 Puffs Every 12 Hrs ; Gargle & Spit After Use  Allergies: 1)  ! Pcn  Past History:  Past Medical History: Elevated alkalyne phosphatase , PMH of  Asthma GERD Hyperlipidemia:[place] Study LDL goal =< 160.NMR Lipoprofile 2007: LDL 173(2380/1393), HDL 72,TG 187. LDL goal = < 100.  Past Surgical History: G3 P2 A1; D&C X1 Endoscopy: Candida  in esophagus 1991 ; Endo  2008 : gastric polyps & mild GE junction inflammtion, Dr Buccini Colonoscopy 2008: neg Dr Matthias Hughs, due 2018; Oral Surgery (wisdom teeth extraction)  Family History: mother : ? gastric cancer M uncle :? 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  The patient denies anorexia, fever, vision loss, decreased hearing, melena, hematochezia, severe indigestion/heartburn, hematuria, suspicious skin lesions, depression, unusual weight change, abnormal bleeding, enlarged lymph nodes, and angioedema.         Weight down 6#.Rare  dysphagia, 1-2 X /year with food or alcohol triggers. CV:  Denies  difficulty breathing at night and difficulty breathing while lying down; No PNDyspnea. Resp:  Denies shortness of breath, sputum productive, and wheezing; Asthma well controlled; rescue MDI used 1-2X/month. Allergy:  Complains of itching eyes and sneezing; Mild  occasional extrinsic symptoms..  Physical Exam  General:  well-nourished,alert,appropriate and cooperative throughout examination Head:  Normocephalic and atraumatic without obvious abnormalities.  Eyes:  No corneal or conjunctival inflammation noted.  Perrla. Funduscopic exam benign, without hemorrhages, exudates or papilledema.  Ears:  External ear exam shows no significant lesions or deformities.  Otoscopic examination reveals clear canals, tympanic membranes are intact bilaterally without bulging, retraction, inflammation or discharge. Hearing is grossly normal bilaterally. Nose:  External nasal examination shows no deformity or inflammation. Nasal mucosa are pink and moist without lesions or exudates. Mouth:  Oral mucosa and oropharynx without lesions or exudates.  Teeth in good repair. No pharyngeal erythema.   Neck:  No deformities, masses, or tenderness noted. Lungs:  Normal respiratory effort, chest expands symmetrically. Lungs are clear to auscultation, no crackles or wheezes. Heart:  normal rate, regular rhythm, no gallop, no rub, no JVD, no HJR, and grade 1/2-1  /6 systolic murmur @ LSB.   Abdomen:  Bowel sounds positive,abdomen soft and non-tender without masses, organomegaly or hernias noted. Genitalia:  Dr. Aldona Bar Msk:  No deformity or scoliosis noted of thoracic or lumbar spine.   Pulses:  R and L carotid,radial,dorsalis pedis and posterior tibial pulses are full and equal bilaterally Extremities:  No clubbing, cyanosis, edema, or deformity noted with normal full range of motion of all joints.   Neurologic:  alert & oriented X3 and DTRs symmetrical and normal.   Skin:  Intact without suspicious lesions or rashes Cervical  Nodes:  No lymphadenopathy noted Axillary Nodes:  No palpable lymphadenopathy Psych:  memory intact for recent and remote, normally interactive, and good eye contact.     Impression & Recommendations:  Problem # 1:  ROUTINE GENERAL MEDICAL EXAM@HEALTH  CARE FACL (ICD-V70.0)  Orders: EKG w/ Interpretation (93000) Venipuncture (16109) TLB-Lipid Panel (80061-LIPID) TLB-BMP (Basic Metabolic Panel-BMET) (80048-METABOL) TLB-CBC Platelet - w/Differential (85025-CBCD) TLB-Hepatic/Liver Function Pnl (80076-HEPATIC) TLB-TSH (Thyroid Stimulating Hormone) (84443-TSH) Specimen Handling (60454)  Problem # 2:  COUGH (ICD-786.2) ERD vs asthma variant etiology  Problem # 3:  HYPERLIPIDEMIA (ICD-272.4)  Her updated medication list for this problem includes:    Vytorin 10-20 Mg Tabs (Ezetimibe-simvastatin) .Marland Kitchen... 1 qhs  Problem # 4:  GERD (ICD-530.81)  Her updated medication list for this problem includes:    Nexium 40 Mg Cpdr (Esomeprazole magnesium) .Marland Kitchen... 1 by mouth once daily as needed  Problem # 5:  ASTHMA (ICD-493.90) Quiescent The following medications were removed from the medication list:    Prednisone 10 Mg Tabs (Prednisone) .Marland KitchenMarland KitchenMarland KitchenMarland Kitchen 4 tabs by mouth x 4 days, then 3 tabs by mouth for 2 days, then 2 tabs for 2 days, then 1 tab for 2 days    Singulair 10 Mg Tabs (Montelukast sodium) .Marland Kitchen... 1 once daily Her updated medication list for this  problem includes:    Proventil Hfa 108 (90 Base) Mcg/act Aers (Albuterol sulfate) .Marland Kitchen... 1-2 puffs every 6 hours as needed    Symbicort 160-4.5 Mcg/act Aero (Budesonide-formoterol fumarate) .Marland Kitchen... 1-2 puffs every 12 hrs ; gargle & spit after use  Complete Medication List: 1)  Fluticasone Propionate 50 Mcg/act Susp (Fluticasone propionate) .Marland Kitchen.. 1 spray each nostril daily 2)  Proventil Hfa 108 (90 Base) Mcg/act Aers (Albuterol sulfate) .Marland Kitchen.. 1-2 puffs every 6 hours as needed 3)  Calcium 1200mg   .... 1 by mouth once daily 4)  Nexium 40 Mg Cpdr (Esomeprazole  magnesium) .Marland Kitchen.. 1 by mouth once daily as needed 5)  Fluoxetine Hcl 10 Mg Tabs (Fluoxetine hcl) .Marland Kitchen.. 1 by mouth once daily 6)  Baby Asa  7)  Vytorin 10-20 Mg Tabs (Ezetimibe-simvastatin) .Marland Kitchen.. 1 qhs 8)  Ambien 5 Mg Tabs (Zolpidem tartrate) .Marland Kitchen.. 1 by mouth at bedtime as needed 9)  Vitamin C 500 Mg Tabs (Ascorbic acid) .... 2 by mouth once daily 10)  Acyclovir Sodium 500 Mg Solr (acyclovir Sodium)  .Marland Kitchen.. 1 by mouth once daily as needed 11)  Loratadine 10 Mg Tabs (Loratadine) .Marland Kitchen.. 1 by mouth once daily as needed 12)  Hydrochlorothiazide 12.5 Mg Caps (Hydrochlorothiazide) .Marland Kitchen.. 1 once daily 13)  Symbicort 160-4.5 Mcg/act Aero (Budesonide-formoterol fumarate) .Marland Kitchen.. 1-2 puffs every 12 hrs ; gargle & spit after use  Lipid Assessment/Plan:      Based on NCEP/ATP III, the patient's risk factor category is "0-1 risk factors".  The patient's lipid goals are as follows: Total cholesterol goal is 200; LDL cholesterol goal is 160; HDL cholesterol goal is 40; Triglyceride goal is 150.  Her LDL cholesterol goal has been met.    Patient Instructions: 1)  Trial of Omeprazole two times a day pre meals for 6-8 weeks for  the cough. 2)  Avoid foods high in acid (tomatoes, citrus juices, spicy foods). Avoid eating within two hours of lying down or before exercising. Do not over eat; try smaller more frequent meals. Elevate head of bed twelve inches when sleeping.

## 2010-04-13 NOTE — Assessment & Plan Note (Signed)
Summary: WHEEZING/DLO   Vital Signs:  Patient profile:   55 year old female Weight:      211 pounds Temp:     98.4 degrees F oral Pulse rate:   68 / minute Pulse rhythm:   regular BP sitting:   144 / 88  (left arm) Cuff size:   regular  Vitals Entered By: Lowella Petties CMA (May 16, 2009 10:35 AM) CC: Cough, congestion, wheezing   History of Present Illness: 55 year old:  Last week and saw Dr. Drue Novel on Monday - was gien some Zpak and on some prednisone. Continues to have bad wheezing and here for reevaluation.  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  Finished it yesterday (prednisone) and feeling a lot of wheezing.   Coughing a great deal as well  ROS: No fever, no chills, sweats, no n/v/d, no rashes, minimal arthalgias  Allergies: 1)  ! Pcn  Past History:  Past medical, surgical, family and social histories (including risk factors) reviewed, and no changes noted (except as noted below).  Past Medical History: Reviewed history from 05/11/2009 and no changes required. h/o  elevated alkalyne phosphatase  Asthma GERD Hyperlipidemia  Past Surgical History: Reviewed history from 08/26/2008 and no changes required. G3 P2 A1; D&C X1 endoscopy: Candida  in esophagus 1991 ; endo neg 2008  Colonoscopy 2008: neg Dr Matthias Hughs, due 2018; Oral Surgery (wisdom teeth)  Family History: Reviewed history from 08/26/2008 and no changes required. mother  ? gastric cancer uncle ? pancreatic cancer; Family health history  limited  Social History: Reviewed history from 08/26/2008 and no changes required. no diet  Occupation: Health and safety inspector @ [employer] Former Smoker: quit age 55 Alcohol use-yes: 1-2 X/month Regular exercise-yes  Physical Exam  General:  Well-developed,well-nourished,in no acute distress; alert,appropriate and cooperative throughout examination Head:  Normocephalic and atraumatic without obvious abnormalities. No apparent alopecia  or balding. Ears:  External ear exam shows no significant lesions or deformities.  Otoscopic examination reveals clear canals, tympanic membranes are intact bilaterally without bulging, retraction, inflammation or discharge. Hearing is grossly normal bilaterally. Nose:  no external deformity.   Mouth:  Oral mucosa and oropharynx without lesions or exudates.  Teeth in good repair. Neck:  No deformities, masses, or tenderness noted. Lungs:  no intercostal retractions and no accessory muscle use.    Diffuse wheezing throughout all lung fields without focal crackles. Heart:  Normal rate and regular rhythm. S1 and S2 normal without gallop, murmur, click, rub or other extra sounds. Extremities:  no edema Neurologic:  alert & oriented X3 and gait normal.   Cervical Nodes:  No lymphadenopathy noted Psych:  Cognition and judgment appear intact. Alert and cooperative with normal attention span and concentration. No apparent delusions, illusions, hallucinations   Impression & Recommendations:  Problem # 1:  ASTHMA, WITH ACUTE EXACERBATION (ICD-493.92) Assessment Deteriorated prolong steroid course - lungs wheezing a great deal albuterol as needed   f/u if acutely decompensating  Her updated medication list for this problem includes:    Pulmicort Flexhaler 180 Mcg/act Inha (Budesonide (inhalation)) ..... Use 1 to 2 inhalations q 12 hours    Proventil Hfa 108 (90 Base) Mcg/act Aers (Albuterol sulfate) .Marland Kitchen... 1-2 puffs every 6 hours as needed    Prednisone 10 Mg Tabs (Prednisone) .Marland KitchenMarland KitchenMarland KitchenMarland Kitchen 4 tabs by mouth x 4 days, then 3 tabs by mouth for 2 days, then 2 tabs for 2 days, then 1 tab for 2 days  Problem # 2:  BRONCHITIS- ACUTE (ICD-466.0) Z pak remains in system currently - may be viral triggering asthma flare  Her updated medication list for this problem includes:    Pulmicort Flexhaler 180 Mcg/act Inha (Budesonide (inhalation)) ..... Use 1 to 2 inhalations q 12 hours    Proventil Hfa 108 (90 Base)  Mcg/act Aers (Albuterol sulfate) .Marland Kitchen... 1-2 puffs every 6 hours as needed    Zithromax Z-pak 250 Mg Tabs (Azithromycin) .Marland Kitchen... As directed  Complete Medication List: 1)  Pulmicort Flexhaler 180 Mcg/act Inha (Budesonide (inhalation)) .... Use 1 to 2 inhalations q 12 hours 2)  Fluticasone Propionate 50 Mcg/act Susp (Fluticasone propionate) .Marland Kitchen.. 1 spray each nostril daily 3)  Proventil Hfa 108 (90 Base) Mcg/act Aers (Albuterol sulfate) .Marland Kitchen.. 1-2 puffs every 6 hours as needed 4)  Calcium 1200mg   .... 1 by mouth once daily 5)  Nexium 40 Mg Cpdr (Esomeprazole magnesium) .Marland Kitchen.. 1 by mouth once daily as needed 6)  Fluoxetine Hcl 10 Mg Tabs (Fluoxetine hcl) .Marland Kitchen.. 1 by mouth once daily 7)  Baby Asa  8)  Vytorin 10-20 Mg Tabs (Ezetimibe-simvastatin) .Marland Kitchen.. 1 qhs 9)  Ambien 5 Mg Tabs (Zolpidem tartrate) .Marland Kitchen.. 1 by mouth at bedtime as needed 10)  Vitamin C 500 Mg Tabs (Ascorbic acid) .Marland Kitchen.. 1 by mouth once daily 11)  Acyclovir Sodium 1000 Mg Solr (Acyclovir sodium) .Marland Kitchen.. 1 by mouth once daily as needed 12)  Loratadine 10 Mg Tabs (Loratadine) .Marland Kitchen.. 1 by mouth once daily as needed 13)  Hydrochlorothiazide 12.5 Mg Caps (Hydrochlorothiazide) .Marland Kitchen.. 1 once daily 14)  Zithromax Z-pak 250 Mg Tabs (Azithromycin) .... As directed 15)  Prednisone 10 Mg Tabs (Prednisone) .... 4 tabs by mouth x 4 days, then 3 tabs by mouth for 2 days, then 2 tabs for 2 days, then 1 tab for 2 days Prescriptions: PREDNISONE 10 MG TABS (PREDNISONE) 4 tabs by mouth x 4 days, then 3 tabs by mouth for 2 days, then 2 tabs for 2 days, then 1 tab for 2 days  #28 x 0   Entered and Authorized by:   Hannah Beat MD   Signed by:   Hannah Beat MD on 05/16/2009   Method used:   Electronically to        [facility]  [phone]* (retail)       [address] [place], [place]  [address]       Ph: [phone] or [phone]       Fax: [phone]   RxID:   [id number]

## 2010-04-13 NOTE — Progress Notes (Signed)
Summary: REFILL REQUEST  Phone Note Refill Request Message from:  Patient on September 04, 2009 4:59 PM  Refills Requested: Medication #1:  FLUTICASONE PROPIONATE 50 MCG/ACT SUSP 1 spray each nostril daily   Dosage confirmed as above?Dosage Confirmed   Supply Requested: 1 month   Last Ref[facility]: [facility]/26/[facility] [address]  PT IS PLANNING TO COME BY MONDAY AROUND NOON TO PICK UP SAMPLES OF VYTORIN. SHE WILL CALL BEFORE SHE COMES.  Next Appointment Scheduled: Sept.21.2011 Initial call taken by: Lavell Islam,  September 04, 2009 5:00 PM  Follow-up for Phone Call        2 Months worth of vytorin placed at the front for pick-up Follow-up by: Shonna Chock,  September 07, 2009 11:47 AM    Prescriptions: FLUTICASONE PROPIONATE 50 MCG/ACT SUSP (FLUTICASONE PROPIONATE) 1 spray each nostril daily  #3 x 1   Entered by:   Shonna Chock   Authorized by:   Marga Melnick MD   Signed by:   Shonna Chock on 09/07/2009   Method used:   Electronically to        [facility]  [phone]* (retail)       [address] [place], [place]  [address]       Ph: [phone] or [phone]       Fax: [phone]   RxID:   [id number]

## 2010-05-26 ENCOUNTER — Telehealth (INDEPENDENT_AMBULATORY_CARE_PROVIDER_SITE_OTHER): Payer: Self-pay | Admitting: *Deleted

## 2010-06-01 NOTE — Progress Notes (Signed)
Summary: Needs alternative medication   Phone Note Call from Patient Call back at Work Phone [phone]   Caller: Patient Summary of Call: Pt says that she would like to change from Vytorin is to expensive preferred alternatives are Lipitor, Caduet, Pravachol, and Zocor. Please choice one.  Initial call taken by: Doristine Devoid CMA,  May 26, 2010 5:02 PM  Follow-up for Phone Call        Change  Vytorin  10/20  to  Lipitor( atorvastatin ) 20 mg daily. Recheck fasting lipids and hepatic panel after 10 weeks. (272.4 , 995.20). Follow-up by: Marga Melnick MD,  May 27, 2010 11:32 AM  Additional Follow-up for Phone Call Additional follow up Details #1::        spoke w/ patient aware of switch and instructions....Marland KitchenMarland KitchenDoristine Devoid CMA  May 27, 2010 12:50 PM     New/Updated Medications: ATORVASTATIN CALCIUM 20 MG TABS (ATORVASTATIN CALCIUM) take one tablet at bedtime Prescriptions: ATORVASTATIN CALCIUM 20 MG TABS (ATORVASTATIN CALCIUM) take one tablet at bedtime  #90 x 1   Entered by:   Doristine Devoid CMA   Authorized by:   Marga Melnick MD   Signed by:   Doristine Devoid CMA on 05/27/2010   Method used:   Electronically to        [facility]  [phone]* (retail)       [address] [place], [place]  [address]       Ph: [phone] or [phone]       Fax: [phone]   RxID:   [id number]

## 2010-06-21 ENCOUNTER — Other Ambulatory Visit (HOSPITAL_COMMUNITY): Payer: Self-pay | Admitting: Obstetrics & Gynecology

## 2010-06-21 DIAGNOSIS — Z1231 Encounter for screening mammogram for malignant neoplasm of breast: Secondary | ICD-10-CM

## 2010-06-24 ENCOUNTER — Telehealth: Payer: Self-pay | Admitting: Internal Medicine

## 2010-06-24 DIAGNOSIS — Z780 Asymptomatic menopausal state: Secondary | ICD-10-CM

## 2010-06-24 NOTE — Telephone Encounter (Signed)
Spoke w/ pt informed she is due for bone density also informed that order will be placed.

## 2010-06-24 NOTE — Telephone Encounter (Signed)
Patient called to check to see if she is due for her bone density checkup----she says she always has it done in [facility] building on [address]

## 2010-06-24 NOTE — Telephone Encounter (Signed)
Addended by: Doristine Devoid on: 06/24/2010 11:55 AM   Modules accepted: Orders

## 2010-06-29 ENCOUNTER — Ambulatory Visit (HOSPITAL_COMMUNITY)
Admission: RE | Admit: 2010-06-29 | Discharge: 2010-06-29 | Disposition: A | Discharge status: Home / Self Care | Payer: BLUE CROSS/BLUE SHIELD | Source: Ambulatory Visit | Attending: Obstetrics & Gynecology | Admitting: Obstetrics & Gynecology

## 2010-06-29 DIAGNOSIS — Z1231 Encounter for screening mammogram for malignant neoplasm of breast: Secondary | ICD-10-CM | POA: Insufficient documentation

## 2010-07-27 ENCOUNTER — Ambulatory Visit (INDEPENDENT_AMBULATORY_CARE_PROVIDER_SITE_OTHER)
Admission: RE | Admit: 2010-07-27 | Discharge: 2010-07-27 | Disposition: A | Discharge status: Home / Self Care | Payer: BLUE CROSS/BLUE SHIELD | Source: Ambulatory Visit

## 2010-07-27 DIAGNOSIS — Z13820 Encounter for screening for osteoporosis: Secondary | ICD-10-CM

## 2010-07-27 DIAGNOSIS — Z780 Asymptomatic menopausal state: Secondary | ICD-10-CM

## 2010-07-27 NOTE — Op Note (Signed)
Dana Mccoy, WEINFELD             ACCOUNT NO.:  [id number]   MEDICAL RECORD NO.:  [id number]          PATIENT TYPE:  AMB   LOCATION:  ENDO                         FACILITY:  [facility]   PHYSICIAN:  Bernette Redbird, M.D.   DATE OF BIRTH:  09/21/1955   DATE OF PROCEDURE:  08/21/2006  DATE OF DISCHARGE:                               OPERATIVE REPORT   PROCEDURE:  Colonoscopy.   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   FINDINGS:  Mild sigmoid diverticulosis.   PROCEDURE:  The nature, purpose and risks of the procedure had been  discussed with the patient who provided written consent.  Sedation for  this procedure and the upper endoscopy which preceded it totaled  Phenergan 12.5 mg IV, fentanyl 100 mcg IV and Versed 12 mg IV without  clinical instability.  The Pentax video adult colonoscope was advanced  to the cecum with moderate difficulty due to some kinking in the sigmoid  region which was overcome by turning the patient into the supine  position.  The cecum was identified by visualization of the ileocecal  valve and appendiceal orifice and pullback was then performed.  The  quality of the prep was excellent and it was felt that all areas were  well seen.   This was a normal examination except for some mild sigmoid  diverticulosis.  No polyps, cancer, colitis or vascular malformations  were seen.  Retroflexion could not be performed in the rectum due to a  small rectal ampulla, but antegrade viewing disclosed no distal rectal  lesions.   No biopsies were obtained.  The patient tolerated the procedure well and  there were no apparent complications.   IMPRESSION:  1. Initial screening colonoscopy in a standard risk patient, without      worrisome findings (V76.51).  2. Sigmoid diverticulosis, mild.   PLAN:  Consider repeat colonoscopy in 10 years for continued screening.     ______________________________  Bernette Redbird, M.D.     RB/MEDQ  D:  08/21/2006  T:  08/21/2006  Job:  [id number]   cc:   Titus Dubin. Alwyn Ren, MD,FACP,FCCP  [phone] [place]  [place] [address]

## 2010-07-27 NOTE — Op Note (Signed)
NAMEKATRESE, SHELL             ACCOUNT NO.:  [id number]   MEDICAL RECORD NO.:  [id number]          PATIENT TYPE:  AMB   LOCATION:  ENDO                         FACILITY:  [facility]   PHYSICIAN:  Bernette Redbird, M.D.   DATE OF BIRTH:  07-11-1955   DATE OF PROCEDURE:  08/21/2006  DATE OF DISCHARGE:                               OPERATIVE REPORT   PROCEDURE:  Upper endoscopy with biopsies.   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  Her  last endoscopy 17 years ago was unrevealing.   FINDINGS:  Small hiatal hernia with possible short segment tongue of  Barrett's mucosa.  Multiple gastric polyps.   PROCEDURE:  The nature, purpose, and risks of the procedure were  familiar to the patient from prior examination, and she provided written  consent.  Sedation for this procedure was Phenergan 12.5 mg, fentanyl 75  mcg, and Versed 8 mg IV without arrhythmias or desaturation.  The Pentax  video endoscope was passed under direct vision.  The vocal cords looked  normal, specifically without evidence of reflux esophagitis.  The  esophagus was entered quite easily.  The proximal esophagus was entirely  normal.  There was a 2-cm hiatal hernia present.  There was a tongue of  what appeared to be Barrett's mucosa fairly broadly, about 1 cm or 1.5  cm across, and extending about 2 to 2.5 cm proximally up the esophagus  from what appeared to be the main portion of the Z line.  It is thus  possible that this represents some short segment Barrett's esophagus,  and at the conclusion of the procedure, several biopsies were obtained  from this area.   No ring or stricture was evident.   The stomach contained no significant residual but had multiple (dozens)  of small smooth polyps and then a few larger smooth polyps, the largest  being perhaps a centimeter or so in size.  Most of these polyps, located  along the greater curve of the midportion stomach, were about 2-4 mm in  size.  Biopsies were obtained from several of them.   No gastritis, erosions, ulcers, or overt masses were observed anywhere  in the stomach, and a retroflexed view of the cardia was unremarkable,  except for the above-mentioned polyps.  The pylorus, duodenal bulb, and  second duodenum looked completely normal.   The patient tolerated the procedure well, and there no apparent  complications.   IMPRESSION:  1. Longstanding reflux disease with possible short segment Barrett's      esophagus on this exam (530.81).  2. Multiple gastric polyps (211.1).   PLAN:  Await pathology results.  Continue current medications for now.  Consider repeat endoscopy in a couple years if intestinal metaplasia is  noted on the Barrett's segment biopsies.           ______________________________  Bernette Redbird, M.D.     RB/MEDQ  D:  08/21/2006  T:  08/21/2006  Job:  [id number]   cc:   Titus Dubin. Alwyn Ren, MD,FACP,FCCP  [phone]  [place]  [place] [address]

## 2010-07-30 NOTE — Assessment & Plan Note (Signed)
[facility]                        [place] OFFICE NOTE   Dana Mccoy                    MRN:          [id number]  DATE:01/24/2006                            DOB:          October 19, 1955    Dana Mccoy had a complete physical examination January 24, 2006 at  the age of 55.   Her active problem consistent of pain in the neck and shoulder area  after yard work. She saw Dr. Clent Mccoy on November 10 and was treated with  Flexeril and a prednisone Dosepak. She subsequently saw her chiropractor  on the 12th. She noted tingling in the right upper extremity initially  which resolved. She has pain gripping the steering wheel or with  repetitive action.   Her past medical history includes 3 pregnancies and 2 deliveries. Upper  endoscopy revealed esophageal candida. She has a history of esophageal  reflux and dyslipidemia and asthma.   Her mother may have had gastric cancer, her uncle had pancreatic cancer.   She quit smoking at age 21 and does not drink. She exercises 4 times a  week. She is on no significant diet.   Presently she is on fluoxetine 10 mg daily, Pulmicort as needed,  supplements and multivitamin. Fluticasone nasal spray, omeprazole 20 mg  daily. PENICILLIN causes itching.   Weight is stable at 207, pulse was 60 and regular, respiratory rate 14,  blood pressure 130/84. Breasts, pelvic and rectal exams are deferred to  her gynecologist. The physical was essentially negative. Specifically  there were no neuromuscular or neurologic deficits.   Her CBC, differential and CMET were normal except for a potassium of  3.3. This was felt to be related to the recent prednisone  administration. Hemoglobin A1c was 5.7 indicating an average sugar of  125 and a 14% increased vascular risk.   Restriction of white carbohydrates and all foods with high fructose corn  syrup was recommended. A NMR lipid profile revealed an LDL of 173 with  approximately 2400 total particles and approximately 1400 small dense  particles. Her triglycerides were 187 again indicating excess carbs or  sugar in her diet. I have asked her to see me to discuss recommendations  other than continuing her exercise and focusing on the carbs and sugars  in her diet.   Screening colonoscopies were recommended; she plans to call Dr. Matthias Mccoy  after March 14, 2006 to schedule this.   It was recommended that she avoid NSAIDs because of her esophageal  reflux. Celebrex 200 mg 1 twice a day for 5 days was recommended and  then 1 daily as needed was recommended.   Because of persistent symptoms, an MRI was performed on November 21.  This revealed central and foraminal stenosis most prominent from C4 to  C7 due to bony spurring, disk bulges and disk protrusions. There was no  ruptured disk. If symptoms persist then a nerve conduction velocity/EMG  should be completed. Physical therapy would also be a reasonable option.  There appears to be no neurosurgical lesion based on the NMR.   A copy of this will be sent to [facility] to facilitate  continuity of care.     Dana Mccoy. Dana Ren, MD,FACP,FCCP  Electronically Signed    [facility]/MedQ  DD: 02/07/2006  DT: 02/07/2006  Job #: [id number]   cc:   Dana Mccoy

## 2010-07-30 NOTE — Assessment & Plan Note (Signed)
[facility]                                   ON-CALL NOTE   RUBBIE, GOOSTREE                    MRN:          [id number]  DATE:01/27/2006                            DOB:          Oct 28, 1955    Phone number [phone].  Patient of Dr. Alwyn Ren.  The phone call came at 6:50  p.m.  Ms. Hilmes has been having spasms in her arm and shoulder, does  not remember any injury, was seen last Saturday and given some Flexeril, saw  Dr. Alwyn Ren this week and was given Celebrex.  She actually cannot use the  arm properly, and she has had to call in to work, and she is a Engineer, civil (consulting) at  [employer], and this has been problematic.  She is frustrated by the fact that  things have not gotten better.  It is not really worse, but she is  frustrated by it.   PLAN:  I told her that we have several options.  She could probably call Dr.  Alwyn Ren on Monday for referral for an orthopedist.  I told her that I would  be willing to see her tomorrow in the clinic, and she will call tomorrow  after 9 if she wants to have an appointment.  Otherwise she can continue the  symptomatic relief with the medications, which have helped a little bit, and  then make a decision about how she wants to proceed.    ______________________________  Karie Schwalbe, MD  Electronically Signed    RIL/MedQ  DD: 01/27/2006  DT: 01/28/2006  Job #: [id number]   cc:   Titus Dubin. Alwyn Ren, MD,FACP,FCCP

## 2010-08-05 ENCOUNTER — Other Ambulatory Visit: Payer: Self-pay | Admitting: Internal Medicine

## 2010-08-12 ENCOUNTER — Encounter: Payer: Self-pay | Admitting: Internal Medicine

## 2010-09-07 ENCOUNTER — Other Ambulatory Visit (INDEPENDENT_AMBULATORY_CARE_PROVIDER_SITE_OTHER): Payer: BC Managed Care – PPO

## 2010-09-07 DIAGNOSIS — T887XXA Unspecified adverse effect of drug or medicament, initial encounter: Secondary | ICD-10-CM

## 2010-09-07 DIAGNOSIS — E785 Hyperlipidemia, unspecified: Secondary | ICD-10-CM

## 2010-09-07 LAB — HEPATIC FUNCTION PANEL
Albumin: 4 g/dL (ref 3.5–5.2)
Alkaline Phosphatase: 116 U/L (ref 39–117)
Bilirubin, Direct: 0.1 mg/dL (ref 0.0–0.3)
Total Bilirubin: 0.7 mg/dL (ref 0.3–1.2)
Total Protein: 6.6 g/dL (ref 6.0–8.3)

## 2010-09-07 LAB — LIPID PANEL
HDL: 54 mg/dL (ref >39.00)
Triglycerides: 245 mg/dL — ABNORMAL HIGH (ref 0.0–149.0)
VLDL: 49 mg/dL — ABNORMAL HIGH (ref 0.0–40.0)

## 2010-09-07 NOTE — Progress Notes (Signed)
Labs only

## 2010-10-31 ENCOUNTER — Other Ambulatory Visit: Payer: Self-pay | Admitting: Internal Medicine

## 2010-11-01 ENCOUNTER — Other Ambulatory Visit: Payer: Self-pay | Admitting: Internal Medicine

## 2010-11-02 ENCOUNTER — Ambulatory Visit (INDEPENDENT_AMBULATORY_CARE_PROVIDER_SITE_OTHER): Payer: 59 | Admitting: Family Medicine

## 2010-11-02 ENCOUNTER — Encounter: Payer: Self-pay | Admitting: Family Medicine

## 2010-11-02 VITALS — BP 122/80 | HR 68 | Temp 98.6°F | Wt 214.4 lb

## 2010-11-02 DIAGNOSIS — S139XXA Sprain of joints and ligaments of unspecified parts of neck, initial encounter: Secondary | ICD-10-CM

## 2010-11-02 DIAGNOSIS — S161XXA Strain of muscle, fascia and tendon at neck level, initial encounter: Secondary | ICD-10-CM

## 2010-11-02 MED ORDER — PREDNISONE 10 MG PO TABS: 10.0000 mg | ORAL_TABLET | Freq: Every day | ORAL | Status: AC

## 2010-11-02 MED ORDER — TRAMADOL HCL 50 MG PO TABS: 50.0000 mg | ORAL_TABLET | Freq: Four times a day (QID) | ORAL | Status: AC | PRN

## 2010-11-02 MED ORDER — ALBUTEROL SULFATE HFA 108 (90 BASE) MCG/ACT IN AERS: INHALATION_SPRAY | RESPIRATORY_TRACT | Status: DC

## 2010-11-02 NOTE — Progress Notes (Signed)
  Subjective:     Dana Mccoy is a 55 y.o. female who presents for evaluation of neck pain. Event that precipitated these symptoms: injured while lifting a heavy box---she thinks.. Onset of symptoms was 7 week ago, and have been unchanged since that time. Current symptoms are paresthesias in R shoulder and arm. Patient denies paresthesias in hands, stiffness in shoulder and hands and weakness in arms, or neck. Patient has had recurrent self limited episodes of neck pain in the past. Previous treatments: chiropractor/manipulation.  The following portions of the patient's history were reviewed and updated as appropriate: allergies, current medications, past family history, past medical history, past social history, past surgical history and problem list.  Review of Systems Pertinent items are noted in HPI.    Objective:    BP 122/80  Pulse 68  Temp(Src) 98.6 F (37 C) (Oral)  Wt 214 lb 6.4 oz (97.251 kg)  SpO2 95% General:   alert, cooperative, appears stated age and no distress  External Deformity:  absent  ROM Cervical Spine:  normal range of motion  Midline Tenderness:  absent midline  Paraspinous tenderness:  mild on the right  UE Neurologic Exam:  unremarkable   X-ray of the cervical spine: Not indicated    Assessment:    Cervical strain    Plan:    Agricultural engineer distributed. Discussed appropriate use of ice and heat. NSAIDs per medication orders. OTC analgesics as needed. Muscle relaxants added per medication orders. Suggested chiropractic. Plain film x-rays. f/u prn

## 2010-11-02 NOTE — Telephone Encounter (Signed)
Dr.Hopper please advise 

## 2010-11-02 NOTE — Patient Instructions (Signed)
Cervical and Neck Sprain and Strain (Neck Sprain and Strain) A cervical sprain is an injury to the neck. The injury can include either over-stretching or even small tears in the ligaments that hold the bones of the neck in place. A strain affects muscles and tendons. Minor injuries usually only involve ligaments and muscles. Because the different parts of the neck are so close together, more severe injuries can involve both sprain and strain. These injuries can affect the muscles, ligaments, tendons, discs, and nerves in the neck. SYMPTOMS  Pain, soreness, stiffness, or burning sensation in the front, back, or sides of the neck. This may develop immediately after injury. Onset of discomfort may also develop slowly and not begin for 24 hours or more.   Shoulder and/or upper back pain.   Limits to the normal movement of the neck.   Headache.   Dizziness.   Weakness and/or abnormal sensation (such as numbness or tingling) of one or both arms and/or hands.   Muscle spasm.   Difficulty with swallowing or chewing.   Tenderness and swelling at the injury site.  CAUSES An injury may be the result of a direct blow or from certain habits that can lead to the symptoms noted above.  Injury from:   Contact sports (such as football, rugby, wrestling, hockey, auto racing, gymnastics, diving, martial arts, and boxing).   Motor vehicle accidents.   Whiplash injuries (see image at right). These are common. They occur when the neck is forcefully whipped or forced backward and/or forward.   Falls.   Lifestyle or awkward postures:   Cradling a telephone between the ear and shoulder.   Sitting in a chair that offers no support.   Working at an ill-designed computer station.   Activities that require hours of repeated or long periods of looking up (stretching the neck backward) or looking down (bending the head/neck forward).  DIAGNOSIS  Most of the time, your caregiver can diagnose this  problem with a careful history and examination. The history will include information about known problems (such as arthritis in the neck) or a previous neck injury. X-rays may be ordered to find out if there is a different problem. X-rays can also help to find problems with the bones of the neck not related to the injury or current symptoms. TREATMENT Several treatment options are available to help pain, spasm, and other symptoms. They include:  Cold helps relieve pain and reduce inflammation. Cold should be applied for 10 to 15 minutes every 2 to 3 hours after any activity that aggravates your symptoms. Use ice packs or an ice massage. Place a towel or cloth in between your skin and the ice pack.   Medication:   Only take over-the-counter or prescription medicines for pain, discomfort, or fever as directed by your caregiver.   Pain relievers or muscle relaxants may be prescribed. Use only as directed and only as much as you need.   Change in the activity that caused the problem. This might include using a headset with a telephone so that the phone is not propped between your ear and shoulder.   Neck collar. Your caregiver may recommend temporary use of a soft cervical collar.   Work station. Changes may be needed in your work place. A better sitting position and/or better posture during work may be part of your treatment.   Physical Therapy. Your caregiver may recommend physical therapy. This can include instructions in the use of stretching and strengthening exercises. Improvement in   posture is important. Exercises and posture training can help stabilize the neck and strengthen muscles and keep symptoms from returning.  HOME CARE INSTRUCTIONS  Other than formal physical therapy, all treatments above can be done at home. Even when not at work, it is important to be conscious of your posture and of activities that can cause a return of symptoms. Most cervical sprains and/or strains are better in  1-3 weeks. As you improve and increase activities, doing a warm up and stretching before the activity will help prevent recurrent problems. SEEK MEDICAL CARE IF:   Pain is not effectively controlled with medication.   You feel unable to decrease pain medication over time as planned.   Activity level is not improving as planned and/or expected.  SEEK IMMEDIATE MEDICAL CARE IF:   While using medication, you develop any bleeding, stomach upset, or signs of an allergic reaction.   Symptoms get worse, become intolerable, and are not helped by medications.   New, unexplained symptoms develop.   You experience numbness, tingling, weakness, or paralysis of any part of your body.  MAKE SURE YOU:   Understand these instructions.   Will watch your condition.   Will get help right away if you are not doing well or get worse.  Document Released: 12/26/2006 Document Re-Released: 05/27/2008 [employer] Patient Information 2011 [employer]. 

## 2010-11-07 ENCOUNTER — Emergency Department (HOSPITAL_COMMUNITY)
Admission: EM | Admit: 2010-11-07 | Discharge: 2010-11-07 | Disposition: A | Discharge status: Home / Self Care | Payer: 59 | Attending: Emergency Medicine | Admitting: Emergency Medicine

## 2010-11-07 DIAGNOSIS — J45909 Unspecified asthma, uncomplicated: Secondary | ICD-10-CM | POA: Insufficient documentation

## 2010-11-07 DIAGNOSIS — Z79899 Other long term (current) drug therapy: Secondary | ICD-10-CM | POA: Insufficient documentation

## 2010-11-07 DIAGNOSIS — M25519 Pain in unspecified shoulder: Secondary | ICD-10-CM | POA: Insufficient documentation

## 2010-11-07 DIAGNOSIS — M5412 Radiculopathy, cervical region: Secondary | ICD-10-CM | POA: Insufficient documentation

## 2010-11-07 DIAGNOSIS — M549 Dorsalgia, unspecified: Secondary | ICD-10-CM | POA: Insufficient documentation

## 2010-11-07 DIAGNOSIS — M542 Cervicalgia: Secondary | ICD-10-CM | POA: Insufficient documentation

## 2010-11-24 ENCOUNTER — Other Ambulatory Visit: Payer: Self-pay | Admitting: Orthopaedic Surgery

## 2010-11-24 DIAGNOSIS — M542 Cervicalgia: Secondary | ICD-10-CM

## 2010-11-27 ENCOUNTER — Other Ambulatory Visit: Payer: 59

## 2011-01-08 ENCOUNTER — Other Ambulatory Visit: Payer: Self-pay | Admitting: Internal Medicine

## 2011-01-10 ENCOUNTER — Other Ambulatory Visit: Payer: Self-pay | Admitting: Internal Medicine

## 2011-02-14 ENCOUNTER — Other Ambulatory Visit: Payer: Self-pay | Admitting: Internal Medicine

## 2011-04-18 ENCOUNTER — Encounter: Payer: Self-pay | Admitting: Family Medicine

## 2011-04-18 ENCOUNTER — Ambulatory Visit (INDEPENDENT_AMBULATORY_CARE_PROVIDER_SITE_OTHER): Payer: 59 | Admitting: Family Medicine

## 2011-04-18 VITALS — BP 140/90 | HR 68 | Temp 98.6°F | Ht 65.0 in | Wt 204.2 lb

## 2011-04-18 DIAGNOSIS — J209 Acute bronchitis, unspecified: Secondary | ICD-10-CM

## 2011-04-18 MED ORDER — AZITHROMYCIN 250 MG PO TABS: 250.0000 mg | ORAL_TABLET | Freq: Every day | ORAL | Status: AC

## 2011-04-18 MED ORDER — GUAIFENESIN-CODEINE 100-10 MG/5ML PO SYRP: 10.0000 mL | ORAL_SOLUTION | Freq: Three times a day (TID) | ORAL | Status: AC | PRN

## 2011-04-18 MED ORDER — BENZONATATE 200 MG PO CAPS: 200.0000 mg | ORAL_CAPSULE | Freq: Three times a day (TID) | ORAL | Status: AC | PRN

## 2011-04-18 NOTE — Patient Instructions (Signed)
Take the Azithromycin as directed for bronchitis Add the Guaifenesin to break up your chest congestion Use the cough pills for daytime and the syrup for night Drink plenty of fluids REST! Call with any questions or concerns Hang in there!!!

## 2011-04-18 NOTE — Assessment & Plan Note (Signed)
New.  Given duration of sxs and underlying lung dz (asthma) will start abx.  Cough meds prn.  Reviewed supportive care and red flags that should prompt return.  Pt expressed understanding and is in agreement w/ plan.

## 2011-04-18 NOTE — Progress Notes (Signed)
  Subjective:    Patient ID: Dana Mccoy, female    DOB: Sep 26, 1955, 56 y.o.   MRN: [id number]  HPI Cough- sxs started 10 days ago w/ cold sxs.  Has been using OTC cough syrup w/out relief.  Last night woke up after taking Nyquil in coughing fit- 'i couldn't get my breath'.  Son called 9-1-1, O2 sats were good, BP was elevated b/c of the coughing.  After 90 minutes, sxs improved- pt declined transport to ER.  Cough is intermittently productive.  Hx of asthma, usually well controlled.  Denies facial pain/pressure, ear pain.  + nasal congestion.  No fevers.  + sick contacts.   Review of Systems For ROS see HPI     Objective:   Physical Exam  Vitals reviewed. Constitutional: She appears well-developed and well-nourished. No distress.  HENT:  Head: Normocephalic and atraumatic.       TMs normal bilaterally Mild nasal congestion Throat w/out erythema, edema, or exudate  Eyes: Conjunctivae and EOM are normal. Pupils are equal, round, and reactive to light.  Neck: Normal range of motion. Neck supple.  Cardiovascular: Normal rate, regular rhythm, normal heart sounds and intact distal pulses.   No murmur heard. Pulmonary/Chest: Effort normal and breath sounds normal. No respiratory distress. She has no wheezes.       + hacking cough  Lymphadenopathy:    She has no cervical adenopathy.          Assessment & Plan:

## 2011-04-26 ENCOUNTER — Encounter: Payer: Self-pay | Admitting: Internal Medicine

## 2011-04-26 ENCOUNTER — Ambulatory Visit (INDEPENDENT_AMBULATORY_CARE_PROVIDER_SITE_OTHER): Payer: 59 | Admitting: Internal Medicine

## 2011-04-26 VITALS — BP 116/80 | HR 64 | Temp 97.9°F | Resp 12 | Ht 64.75 in | Wt 202.0 lb

## 2011-04-26 DIAGNOSIS — E785 Hyperlipidemia, unspecified: Secondary | ICD-10-CM

## 2011-04-26 DIAGNOSIS — M4802 Spinal stenosis, cervical region: Secondary | ICD-10-CM | POA: Insufficient documentation

## 2011-04-26 DIAGNOSIS — Z0000 Encounter for general adult medical examination without abnormal findings: Secondary | ICD-10-CM

## 2011-04-26 LAB — CBC WITH DIFFERENTIAL/PLATELET
Basophils Absolute: 0 10*3/uL (ref 0.0–0.1)
Eosinophils Relative: 2.4 % (ref 0.0–5.0)
Lymphs Abs: 1.4 10*3/uL (ref 0.7–4.0)
MCV: 90.2 fl (ref 78.0–100.0)
Monocytes Absolute: 0.6 10*3/uL (ref 0.1–1.0)
Neutrophils Relative %: 72.7 % (ref 43.0–77.0)
Platelets: 296 10*3/uL (ref 150.0–400.0)
RDW: 14 % (ref 11.5–14.6)
WBC: 8.2 10*3/uL (ref 4.5–10.5)

## 2011-04-26 NOTE — Patient Instructions (Addendum)
Preventive Health Care: Exercise  30-45  minutes a day, 3-4 days a week. Walking is especially valuable in preventing Osteoporosis. Eat a low-fat diet with lots of fruits and vegetables, up to 7-9 servings per day. Consume less than 30 grams of sugar per day from foods & drinks with High Fructose Corn Syrup as # 1,2,3 or #4 on label. Plain Mucinex for thick secretions ;force NON dairy fluids . Use a Neti pot daily as needed for sinus congestion. Flonase  1 spray in each nostril twice a day as needed. Use the "crossover" technique as discussed.Nasal cleansing in the shower as discussed. Make sure that all residual soap is removed to prevent irritation.

## 2011-04-26 NOTE — Progress Notes (Signed)
  Subjective:    Patient ID: Dana Mccoy, female    DOB: 1955/03/28, 56 y.o.   MRN: [id number]  HPI  [name]  is here for a physical;acute issues include residual head congestion related to her recent respiratory tract infection.      Review of Systems She denies symptoms of rhinosinusitis such as frontal or maxillary pain; nasal purulence;or  fever or chills. She has had some sensation of chilling recently w/o rigor. She also denies any purulent sputum. Her reactive airways are well controlled with Symbicort one inhalation daily. Rarely she has had acute temporary upper airway obstruction with meals.     Objective:   Physical Exam Gen.: Healthy and well-nourished in appearance. Alert, appropriate and cooperative throughout exam. Head: Normocephalic without obvious abnormalities Eyes: No corneal or conjunctival inflammation noted. Pupils slightly asymmetric (OD > OS) but reactive to light and accommodation. Fundal exam is benign without hemorrhages, exudate, papilledema. Extraocular motion intact. Vision grossly normal. Ears: External  ear exam reveals no significant lesions or deformities. Canals clear .TMs normal. Hearing is grossly normal bilaterally. Nose: External nasal exam reveals no deformity or inflammation. Nasal mucosa are pink and moist. No lesions or exudates noted. No septal deviation Mouth: Oral mucosa and oropharynx reveal no lesions or exudates. Teeth in good repair. Neck: No deformities, masses, or tenderness noted. Range of motion & Thyroid normal Lungs: Normal respiratory effort; chest expands symmetrically. Lungs are clear to auscultation without rales, wheezes, or increased work of breathing. Heart: Normal rate and rhythm. Normal S1 and S2. No gallop, click, or rub. S4 with slurring at  LSB ;no  murmur. Abdomen: Bowel sounds normal; abdomen soft and nontender. No masses, organomegaly or hernias noted. Genitalia:Dr Wein   .                                                                                    Musculoskeletal/extremities: No deformity or scoliosis noted of  the thoracic or lumbar spine. No clubbing, cyanosis, edema, or deformity noted. Range of motion  normal .Tone & strength  normal.Joints normal. Nail health  good. Vascular: Carotid, radial artery, dorsalis pedis and  posterior tibial pulses are full and equal. No bruits present. Neurologic: Alert and oriented x3. Deep tendon reflexes symmetrical and normal.          Skin: Intact without suspicious lesions or rashes. Lymph: No cervical, axillary lymphadenopathy present. Psych: Mood and affect are normal. Normally interactive                                                                                        Assessment & Plan:  #1 comprehensive physical exam; no acute findings #2 see Problem List with Assessments & Recommendations Plan: see Orders

## 2011-04-27 ENCOUNTER — Encounter: Payer: Self-pay | Admitting: Internal Medicine

## 2011-04-27 LAB — LIPID PANEL
Cholesterol: 165 mg/dL (ref 0–200)
HDL: 55.1 mg/dL (ref >39.00)
LDL Cholesterol: 88 mg/dL (ref 0–99)
Total CHOL/HDL Ratio: 3
Triglycerides: 111 mg/dL (ref 0.0–149.0)
VLDL: 22.2 mg/dL (ref 0.0–40.0)

## 2011-04-27 LAB — BASIC METABOLIC PANEL
BUN: 15 mg/dL (ref 6–23)
Chloride: 105 mEq/L (ref 96–112)
Creatinine, Ser: 0.8 mg/dL (ref 0.4–1.2)
GFR: 81.22 mL/min (ref >60.00)
Glucose, Bld: 80 mg/dL (ref 70–99)

## 2011-04-27 LAB — HEPATIC FUNCTION PANEL
ALT: 20 U/L (ref 0–35)
Bilirubin, Direct: 0.1 mg/dL (ref 0.0–0.3)
Total Bilirubin: 0.7 mg/dL (ref 0.3–1.2)

## 2011-05-23 ENCOUNTER — Other Ambulatory Visit: Payer: Self-pay

## 2011-05-23 MED ORDER — HYDROCHLOROTHIAZIDE 12.5 MG PO CAPS: 12.5000 mg | ORAL_CAPSULE | Freq: Every day | ORAL | Status: DC

## 2011-05-23 NOTE — Telephone Encounter (Signed)
Addended by: Lahoma Crocker on: 05/23/2011 03:02 PM   Modules accepted: Orders

## 2011-05-23 NOTE — Telephone Encounter (Signed)
Patient called requesting a 90 day supply be sent to her mail order pharmacy and a 30 day to her local pharmacy.

## 2011-05-23 NOTE — Telephone Encounter (Signed)
Prescription refilled locally

## 2011-05-24 ENCOUNTER — Encounter: Payer: 59 | Admitting: Internal Medicine

## 2011-07-14 ENCOUNTER — Other Ambulatory Visit: Payer: Self-pay | Admitting: Internal Medicine

## 2011-07-14 DIAGNOSIS — Z1231 Encounter for screening mammogram for malignant neoplasm of breast: Secondary | ICD-10-CM

## 2011-08-04 ENCOUNTER — Telehealth: Payer: Self-pay | Admitting: Internal Medicine

## 2011-08-04 MED ORDER — ATORVASTATIN CALCIUM 20 MG PO TABS: ORAL_TABLET | ORAL | Status: DC

## 2011-08-04 NOTE — Telephone Encounter (Signed)
RX sent

## 2011-08-04 NOTE — Telephone Encounter (Signed)
Pt would like 3 month supply of atorvastatin 20mg  sent to [facility]. She previously has used [facility], but would like to use [facility] for this medication now.

## 2011-08-04 NOTE — Telephone Encounter (Signed)
Addended by: Maurice Small on: 08/04/2011 11:39 AM   Modules accepted: Orders

## 2011-08-04 NOTE — Telephone Encounter (Signed)
OK 

## 2011-08-04 NOTE — Telephone Encounter (Signed)
Error. bc °

## 2011-08-09 ENCOUNTER — Other Ambulatory Visit: Payer: Self-pay | Admitting: Internal Medicine

## 2011-08-11 ENCOUNTER — Ambulatory Visit (HOSPITAL_COMMUNITY): Payer: Self-pay

## 2011-08-11 ENCOUNTER — Ambulatory Visit (HOSPITAL_COMMUNITY)
Admission: RE | Admit: 2011-08-11 | Discharge: 2011-08-11 | Disposition: A | Discharge status: Home / Self Care | Payer: 59 | Source: Ambulatory Visit | Attending: Internal Medicine | Admitting: Internal Medicine

## 2011-08-11 DIAGNOSIS — Z1231 Encounter for screening mammogram for malignant neoplasm of breast: Secondary | ICD-10-CM | POA: Insufficient documentation

## 2011-10-25 ENCOUNTER — Other Ambulatory Visit: Payer: Self-pay | Admitting: Internal Medicine

## 2011-10-25 MED ORDER — BUDESONIDE-FORMOTEROL FUMARATE 160-4.5 MCG/ACT IN AERO: INHALATION_SPRAY | RESPIRATORY_TRACT | Status: DC

## 2011-10-25 NOTE — Telephone Encounter (Signed)
SYMBICORT 160-4.5 MCG INHALER LAST FILL: 06/04/11 QTY: 10.2 GM TEN AND TWO TENTH(S) USE 1-2 PUFFS EVERY 12 HOURS GARGLE AND SPIT AFTER USE

## 2012-02-08 ENCOUNTER — Other Ambulatory Visit: Payer: Self-pay | Admitting: Internal Medicine

## 2012-04-28 ENCOUNTER — Other Ambulatory Visit: Payer: Self-pay

## 2012-06-18 ENCOUNTER — Other Ambulatory Visit: Payer: Self-pay | Admitting: Internal Medicine

## 2012-07-31 ENCOUNTER — Ambulatory Visit (INDEPENDENT_AMBULATORY_CARE_PROVIDER_SITE_OTHER): Payer: 59 | Admitting: Internal Medicine

## 2012-07-31 ENCOUNTER — Encounter: Payer: Self-pay | Admitting: Internal Medicine

## 2012-07-31 VITALS — BP 124/86 | HR 54 | Temp 98.0°F | Resp 12 | Ht 64.08 in | Wt 199.0 lb

## 2012-07-31 DIAGNOSIS — E785 Hyperlipidemia, unspecified: Secondary | ICD-10-CM

## 2012-07-31 DIAGNOSIS — R030 Elevated blood-pressure reading, without diagnosis of hypertension: Secondary | ICD-10-CM

## 2012-07-31 DIAGNOSIS — J45909 Unspecified asthma, uncomplicated: Secondary | ICD-10-CM

## 2012-07-31 DIAGNOSIS — K219 Gastro-esophageal reflux disease without esophagitis: Secondary | ICD-10-CM

## 2012-07-31 DIAGNOSIS — Z23 Encounter for immunization: Secondary | ICD-10-CM

## 2012-07-31 DIAGNOSIS — Z0000 Encounter for general adult medical examination without abnormal findings: Secondary | ICD-10-CM

## 2012-07-31 DIAGNOSIS — M4802 Spinal stenosis, cervical region: Secondary | ICD-10-CM

## 2012-07-31 LAB — BASIC METABOLIC PANEL
BUN: 16 mg/dL (ref 6–23)
CO2: 31 mEq/L (ref 19–32)
Calcium: 9.8 mg/dL (ref 8.4–10.5)
Creatinine, Ser: 0.7 mg/dL (ref 0.4–1.2)
Glucose, Bld: 82 mg/dL (ref 70–99)

## 2012-07-31 LAB — HEPATIC FUNCTION PANEL
Albumin: 3.9 g/dL (ref 3.5–5.2)
Alkaline Phosphatase: 111 U/L (ref 39–117)
Bilirubin, Direct: 0 mg/dL (ref 0.0–0.3)
Total Bilirubin: 0.4 mg/dL (ref 0.3–1.2)

## 2012-07-31 LAB — CBC WITH DIFFERENTIAL/PLATELET
Basophils Absolute: 0 10*3/uL (ref 0.0–0.1)
Eosinophils Absolute: 0.1 10*3/uL (ref 0.0–0.7)
Lymphocytes Relative: 16.2 % (ref 12.0–46.0)
MCHC: 33.1 g/dL (ref 30.0–36.0)
Neutro Abs: 5.5 10*3/uL (ref 1.4–7.7)
Neutrophils Relative %: 75 % (ref 43.0–77.0)
Platelets: 293 10*3/uL (ref 150.0–400.0)
RDW: 16.1 % — ABNORMAL HIGH (ref 11.5–14.6)

## 2012-07-31 LAB — LIPID PANEL
HDL: 52.3 mg/dL (ref >39.00)
VLDL: 22 mg/dL (ref 0.0–40.0)

## 2012-07-31 MED ORDER — ALBUTEROL SULFATE HFA 108 (90 BASE) MCG/ACT IN AERS: INHALATION_SPRAY | RESPIRATORY_TRACT | Status: DC

## 2012-07-31 MED ORDER — BUDESONIDE-FORMOTEROL FUMARATE 160-4.5 MCG/ACT IN AERO: INHALATION_SPRAY | RESPIRATORY_TRACT | Status: DC

## 2012-07-31 MED ORDER — ATORVASTATIN CALCIUM 20 MG PO TABS: ORAL_TABLET | ORAL | Status: DC

## 2012-07-31 MED ORDER — HYDROCHLOROTHIAZIDE 12.5 MG PO CAPS: ORAL_CAPSULE | ORAL | Status: DC

## 2012-07-31 NOTE — Progress Notes (Signed)
  Subjective:    Patient ID: Dana Mccoy, female    DOB: 1955-05-10, 57 y.o.   MRN: [id number]  HPI  Dana Mccoy is here for a physical;acute issues include  reflux esophagitis symptomatically improved with high dose PPI.     Review of Systems She is on a heart healthy diet; she exercises as walking & yardwork 30-40 minutes 3 times per week without symptoms. Specifically she denies chest pain, palpitations, dyspnea, or claudication. Advanced cholesterol testing reveals her LDL goal is less than 100, ideally < 70. There is medication compliance with the statin. Significant abdominal symptoms, memory deficit, or myalgias denied. .     Objective:   Physical Exam  Gen.: Healthy and well-nourished in appearance. Alert, appropriate and cooperative throughout exam. Head: Normocephalic without obvious abnormalities Eyes: No corneal or conjunctival inflammation noted.  Extraocular motion intact. Vision grossly normal with lenses Ears: External  ear exam reveals no significant lesions or deformities. Canals clear .TMs normal. Hearing is grossly normal bilaterally. Nose: External nasal exam reveals no deformity or inflammation. Nasal mucosa are pink and moist. No lesions or exudates noted.   Mouth: Oral mucosa and oropharynx reveal no lesions or exudates. Teeth in good repair. Neck: No deformities, masses, or tenderness noted. Range of motion decreased. Thyroid normal. Lungs: Normal respiratory effort; chest expands symmetrically. Lungs are clear to auscultation without rales, wheezes, or increased work of breathing. Heart: Normal rate and rhythm. Normal S1 and S2. No gallop, click, or rub.S4 w/o murmur. Abdomen: Bowel sounds normal; abdomen soft and nontender. No masses, organomegaly or hernias noted. Genitalia: As per Gyn                                  Musculoskeletal/extremities: There is some asymmetry of the posterior thoracic musculature suggesting occult scoliosis. No clubbing, cyanosis, edema,  or significant extremity  deformity noted. Range of motion normal .Tone & strength  Normal. Joints normal. Nail health good. Able to lie down & sit up w/o help. Negative SLR bilaterally to 90 degrees Vascular: Carotid, radial artery, dorsalis pedis and  posterior tibial pulses are full and equal. No bruits present. Neurologic: Alert and oriented x3. Deep tendon reflexes symmetrical and normal.       Skin: Intact without suspicious lesions or rashes. Lymph: No cervical, axillary lymphadenopathy present. Psych: Mood and affect are normal. Normally interactive                                                                                        Assessment & Plan:  #1 comprehensive physical exam; no acute findings  Plan: see Orders  & Recommendations

## 2012-07-31 NOTE — Patient Instructions (Addendum)
Preventive Health Care: Exercise  30-45  minutes a day, 3-4 days a week. Walking is especially valuable in preventing Osteoporosis. Eat a low-fat diet with lots of fruits and vegetables, up to 7-9 servings per day. This would eliminate need for vitamin supplements for most individuals. Consume less than 30 grams of sugar per day from foods & drinks with High Fructose Corn Syrup as #2,3 or #4 on label. Reflux of gastric acid may be asymptomatic as this may occur mainly during sleep.The triggers for reflux  include stress; the "aspirin family" ; alcohol; peppermint; and caffeine (coffee, tea, cola, and chocolate). The aspirin family would include aspirin and the nonsteroidal agents such as ibuprofen &  Naproxen. Tylenol would not cause reflux. If having symptoms ; food & drink should be avoided for @ least 2 hours before going to bed.  Please review the record and make any corrections; share this with all medical staff seen.

## 2012-09-02 ENCOUNTER — Other Ambulatory Visit: Payer: Self-pay | Admitting: Internal Medicine

## 2012-09-26 ENCOUNTER — Other Ambulatory Visit: Payer: Self-pay | Admitting: Internal Medicine

## 2012-09-26 DIAGNOSIS — Z1231 Encounter for screening mammogram for malignant neoplasm of breast: Secondary | ICD-10-CM

## 2012-09-27 ENCOUNTER — Ambulatory Visit (HOSPITAL_COMMUNITY)
Admission: RE | Admit: 2012-09-27 | Discharge: 2012-09-27 | Disposition: A | Discharge status: Home / Self Care | Payer: 59 | Source: Ambulatory Visit | Attending: Internal Medicine | Admitting: Internal Medicine

## 2012-09-27 DIAGNOSIS — Z1231 Encounter for screening mammogram for malignant neoplasm of breast: Secondary | ICD-10-CM

## 2012-11-01 ENCOUNTER — Telehealth: Payer: Self-pay

## 2012-11-01 MED ORDER — FLUTICASONE-SALMETEROL 250-50 MCG/DOSE IN AEPB: 1.0000 | INHALATION_SPRAY | Freq: Two times a day (BID) | RESPIRATORY_TRACT | Status: DC

## 2012-11-01 NOTE — Telephone Encounter (Signed)
Message left on triage voicemail: Patient is currently using Symbicort and was recently informed that this is no longer covered by insurance. Alternatives include Advair 250/50 and Dulera 200/5. Pharmacy [facility]P is out of the office, will send to covering MD (Dr.Tabori)  Last OV 07/31/2012 (Annual Exam)

## 2012-11-01 NOTE — Telephone Encounter (Signed)
RX sent, medication list updated to reflect mediation change

## 2012-11-01 NOTE — Telephone Encounter (Signed)
Ok to switch to Advair 250/50- 1 puff BID

## 2012-11-01 NOTE — Telephone Encounter (Signed)
Message left on triage voicemail: Insurance no longer covers symbicort. Alternatives include Advair 250/50 or Dulera 200/5

## 2013-01-17 ENCOUNTER — Other Ambulatory Visit: Payer: Self-pay

## 2013-03-09 ENCOUNTER — Other Ambulatory Visit: Payer: Self-pay | Admitting: Internal Medicine

## 2013-03-10 ENCOUNTER — Other Ambulatory Visit: Payer: Self-pay | Admitting: Internal Medicine

## 2013-03-11 NOTE — Telephone Encounter (Signed)
Atorvastatin refilled per protocol. JG//CMA 

## 2013-04-01 ENCOUNTER — Ambulatory Visit (INDEPENDENT_AMBULATORY_CARE_PROVIDER_SITE_OTHER): Payer: 59 | Admitting: Physician Assistant

## 2013-04-01 ENCOUNTER — Encounter: Payer: Self-pay | Admitting: Physician Assistant

## 2013-04-01 VITALS — BP 106/76 | HR 85 | Temp 98.2°F | Resp 18 | Ht 64.75 in | Wt 199.5 lb

## 2013-04-01 DIAGNOSIS — J209 Acute bronchitis, unspecified: Secondary | ICD-10-CM

## 2013-04-01 MED ORDER — AZITHROMYCIN 250 MG PO TABS: ORAL_TABLET | ORAL | Status: DC

## 2013-04-01 MED ORDER — BENZONATATE 100 MG PO CAPS: 100.0000 mg | ORAL_CAPSULE | Freq: Two times a day (BID) | ORAL | Status: DC | PRN

## 2013-04-01 MED ORDER — METHYLPREDNISOLONE (PAK) 4 MG PO TABS
ORAL_TABLET | ORAL | Status: DC
Start: 2013-04-01 — End: 2013-07-11

## 2013-04-01 NOTE — Patient Instructions (Signed)
Please take antibiotic and steroid as prescribed.  Increase fluid intake.  Rest.  Saline nasal spray.  Continue allergy and asthma medications.  Place a humidifier in the bedroom.  Mucinex as needed for congestion.  Please call or return to clinic if symptoms not improving.

## 2013-04-01 NOTE — Progress Notes (Signed)
Pre visit review using our clinic review tool, if applicable. No additional management support is needed unless otherwise documented below in the visit note/SLS  

## 2013-04-02 DIAGNOSIS — J209 Acute bronchitis, unspecified: Secondary | ICD-10-CM | POA: Insufficient documentation

## 2013-04-02 NOTE — Progress Notes (Addendum)
Patient presents to clinic today complaining of head and chest congestion, with sinus pain, postnasal drip, sore throat and productive cough. Symptoms began gradually. Symptoms present for one week. Patient feels her symptoms are getting worse.  Patient has history of asthma. Denies increased use of albuterol. Denies shortness of breath and wheezing. Denies recent travel or sick contact.  Past Medical History  Diagnosis Date  . GERD (gastroesophageal reflux disease)   . Reactive airway disease     post CAP  . Hyperlipidemia     Current Outpatient Prescriptions on File Prior to Visit  Medication Sig Dispense Refill  . acyclovir (ZOVIRAX) 400 MG tablet Take 400 mg by mouth daily. Patient states she has a 500mg  tab (not an option in epic), filled by another MD      . albuterol (PROVENTIL HFA) 108 (90 BASE) MCG/ACT inhaler 1-2 puffs every 6 hours as needed  1 Inhaler  5  . aspirin EC 81 MG tablet Take 81 mg by mouth daily.        Marland Kitchen atorvastatin (LIPITOR) 20 MG tablet TAKE ONE TABLET AT BEDTIME  30 tablet  11  . calcium carbonate (OS-CAL) 600 MG TABS Take 600 mg by mouth 2 (two) times daily as needed.       . clobetasol cream (TEMOVATE) 2.54 % Apply 1 application topically daily.      Marland Kitchen EPINEPHrine (EPIPEN 2-PAK) 0.3 mg/0.3 mL DEVI Inject 0.3 mg into the muscle once.      . ferrous sulfate 325 (65 FE) MG tablet Take 325 mg by mouth daily with breakfast.      . FLUoxetine (PROZAC) 10 MG tablet Take 10 mg by mouth daily.        . Fluticasone-Salmeterol (ADVAIR) 250-50 MCG/DOSE AEPB Inhale 1 puff into the lungs every 12 (twelve) hours.  60 each  5  . hydrochlorothiazide (MICROZIDE) 12.5 MG capsule Take 1 capsule daily  30 capsule  11  . omeprazole (PRILOSEC) 40 MG capsule Take 40 mg by mouth 2 (two) times daily.       No current facility-administered medications on file prior to visit.    Allergies  Allergen Reactions  . Penicillins     RASH    Family History  Problem Relation Age of Onset   . Adopted: Yes    History   Social History  . Marital Status: Divorced    Spouse Name: N/A    Number of Children: N/A  . Years of Education: N/A   Social History Main Topics  . Smoking status: Former Smoker    Quit date: 03/14/1984  . Smokeless tobacco: Never Used     Comment: smoked 1975-1986, up to 1.5 ppd  . Alcohol Use: Yes     Comment:  VERY rarely  . Drug Use: No  . Sexual Activity: None   Other Topics Concern  . None   Social History Narrative  . None   Review of Systems - See HPI.  All other ROS are negative.  Filed Vitals:   04/01/13 1543  BP: 106/76  Pulse: 85  Temp: 98.2 F (36.8 C)  Resp: 18   Physical Exam  Vitals reviewed. Constitutional: She is oriented to person, place, and time and well-developed, well-nourished, and in no distress.  HENT:  Head: Normocephalic and atraumatic.  Right Ear: External ear normal.  Left Ear: External ear normal.  Nose: Nose normal.  Mouth/Throat: Oropharynx is clear and moist. No oropharyngeal exudate.  Tympanic membranes within normal limits bilaterally.  Tenderness to percussion of maxillary sinuses noted on examination.  Eyes: Conjunctivae are normal. Pupils are equal, round, and reactive to light.  Neck: Neck supple.  Cardiovascular: Normal rate, regular rhythm, normal heart sounds and intact distal pulses.   Pulmonary/Chest: Effort normal and breath sounds normal. No respiratory distress. She has no wheezes. She has no rales. She exhibits no tenderness.  Lymphadenopathy:    She has no cervical adenopathy.  Neurological: She is alert and oriented to person, place, and time.  Skin: Skin is warm and dry. No rash noted.  Psychiatric: Affect normal.    No results found for this or any previous visit (from the past 2160 hour(s)).  Assessment/Plan: Acute bronchitis With sinus symptoms. Rx azithromycin. Rx'd [employer] Albuterol as needed. Increase fluid intake. Rest. Saline nasal spray. Probiotic. Plain  Mucinex. Humidifier in bedroom. Please call or return to clinic if symptoms not improving.

## 2013-04-02 NOTE — Assessment & Plan Note (Addendum)
With sinus symptoms. Rx azithromycin. Rx'd [employer] Albuterol as needed. Increase fluid intake. Rest. Saline nasal spray. Probiotic. Plain Mucinex. Humidifier in bedroom. Please call or return to clinic if symptoms not improving.

## 2013-05-28 ENCOUNTER — Other Ambulatory Visit: Payer: Self-pay | Admitting: Obstetrics & Gynecology

## 2013-07-11 ENCOUNTER — Encounter: Payer: Self-pay | Admitting: *Deleted

## 2013-07-11 ENCOUNTER — Ambulatory Visit (INDEPENDENT_AMBULATORY_CARE_PROVIDER_SITE_OTHER): Payer: 59 | Admitting: Family Medicine

## 2013-07-11 ENCOUNTER — Encounter: Payer: Self-pay | Admitting: Family Medicine

## 2013-07-11 VITALS — BP 104/78 | HR 74 | Temp 98.8°F | Ht 64.75 in | Wt 198.0 lb

## 2013-07-11 DIAGNOSIS — J329 Chronic sinusitis, unspecified: Secondary | ICD-10-CM

## 2013-07-11 DIAGNOSIS — J309 Allergic rhinitis, unspecified: Secondary | ICD-10-CM

## 2013-07-11 MED ORDER — FLUTICASONE PROPIONATE 50 MCG/ACT NA SUSP: 2.0000 | Freq: Every day | NASAL | Status: DC

## 2013-07-11 MED ORDER — AZITHROMYCIN 250 MG PO TABS: ORAL_TABLET | ORAL | Status: DC

## 2013-07-11 NOTE — Progress Notes (Signed)
No chief complaint on file.   HPI:  -started:about 4-5 days ago -symptoms:nasal congestion, sore throat, cough, a little facial pain for 1 days -denies:fever, SOB, NVD, tooth pain -has tried: nettie pot andoral allergy problem -sick contacts/travel/risks: denies flu exposure, tick exposure or or Ebola risks -Hx of: allergies, sinusitis ROS: See pertinent positives and negatives per HPI.  Past Medical History  Diagnosis Date  . GERD (gastroesophageal reflux disease)   . Reactive airway disease     post CAP  . Hyperlipidemia     Past Surgical History  Procedure Laterality Date  . Dilation and curettage of uterus    . Wisdom tooth extraction    . Colonoscopy  2007    Dr Cristina Gong  . Upper gi endoscopy      X 2  . Cardiac catheterization  1997    negative    Family History  Problem Relation Age of Onset  . Adopted: Yes    History   Social History  . Marital Status: Divorced    Spouse Name: N/A    Number of Children: N/A  . Years of Education: N/A   Social History Main Topics  . Smoking status: Former Smoker    Quit date: 03/14/1984  . Smokeless tobacco: Never Used     Comment: smoked 1975-1986, up to 1.5 ppd  . Alcohol Use: Yes     Comment:  VERY rarely  . Drug Use: No  . Sexual Activity: None   Other Topics Concern  . None   Social History Narrative  . None    Current outpatient prescriptions:acyclovir (ZOVIRAX) 400 MG tablet, Take 400 mg by mouth daily. Patient states she has a 500mg  tab (not an option in epic), filled by another MD, Disp: , Rfl: ;  albuterol (PROVENTIL HFA) 108 (90 BASE) MCG/ACT inhaler, 1-2 puffs every 6 hours as needed, Disp: 1 Inhaler, Rfl: 5;  aspirin EC 81 MG tablet, Take 81 mg by mouth daily.  , Disp: , Rfl:  clobetasol cream (TEMOVATE) 0.24 %, Apply 1 application topically daily., Disp: , Rfl: ;  EPINEPHrine (EPIPEN 2-PAK) 0.3 mg/0.3 mL DEVI, Inject 0.3 mg into the muscle once., Disp: , Rfl: ;  ferrous sulfate 325 (65 FE) MG tablet,  Take 325 mg by mouth daily with breakfast., Disp: , Rfl: ;  FLUoxetine (PROZAC) 10 MG tablet, Take 10 mg by mouth daily.  , Disp: , Rfl:  Fluticasone-Salmeterol (ADVAIR) 250-50 MCG/DOSE AEPB, Inhale 1 puff into the lungs every 12 (twelve) hours., Disp: 60 each, Rfl: 5;  hydrochlorothiazide (MICROZIDE) 12.5 MG capsule, Take 1 capsule daily, Disp: 30 capsule, Rfl: 11;  omeprazole (PRILOSEC) 40 MG capsule, Take 40 mg by mouth 2 (two) times daily., Disp: , Rfl: ;  azithromycin (ZITHROMAX) 250 MG tablet, 2 tabs daily then 1 tab daily for four more days, Disp: 6 tablet, Rfl: 0 benzonatate (TESSALON) 100 MG capsule, Take 1 capsule (100 mg total) by mouth 2 (two) times daily as needed for cough., Disp: 20 capsule, Rfl: 0;  fluticasone (FLONASE) 50 MCG/ACT nasal spray, Place 2 sprays into both nostrils daily., Disp: 16 g, Rfl: 0  EXAM:  Filed Vitals:   07/11/13 1008  BP: 104/78  Pulse: 74  Temp: 98.8 F (37.1 C)    Body mass index is 33.19 kg/(m^2).  GENERAL: vitals reviewed and listed above, alert, oriented, appears well hydrated and in no acute distress  HEENT: atraumatic, conjunttiva clear, no obvious abnormalities on inspection of external nose and ears, normal appearance  of ear canals and TMs, clear nasal congestion, mild post oropharyngeal erythema with PND, no tonsillar edema or exudate, no sinus TTP  NECK: no obvious masses on inspection  LUNGS: clear to auscultation bilaterally, no wheezes, rales or rhonchi, good air movement  CV: HRRR, no peripheral edema  MS: moves all extremities without noticeable abnormality  PSYCH: pleasant and cooperative, no obvious depression or anxiety  ASSESSMENT AND PLAN:  Discussed the following assessment and plan:  Rhinosinusitis - Plan: azithromycin (ZITHROMAX) 250 MG tablet  Allergic rhinitis - Plan: fluticasone (FLONASE) 50 MCG/ACT nasal spray  -given HPI and exam findings today, a serious infection or illness is unlikely. We discussed  potential etiologies, with VURI being most likely, and advised supportive care and monitoring. We discussed treatment side effects, likely course, antibiotic misuse, transmission, and signs of developing a serious illness. -abx provided given her hx of sinusitis in case facial pain persists or symptoms worsen, risks discussed, to discard if does not use -restart INS for allergies for 21 days -of course, we advised to return or notify a doctor immediately if symptoms worsen or persist or new concerns arise.    There are no Patient Instructions on file for this visit.   Lucretia Kern

## 2013-07-11 NOTE — Progress Notes (Signed)
Pre visit review using our clinic review tool, if applicable. No additional management support is needed unless otherwise documented below in the visit note. 

## 2013-07-11 NOTE — Patient Instructions (Signed)
INSTRUCTIONS FOR UPPER RESPIRATORY INFECTION:  -plenty of rest and fluids  -nasal saline wash 2-3 times daily (use prepackaged nasal saline or bottled/distilled water if making your own)   -clean nose with nasal saline before using the nasal steroid or sinex  -can use AFRIN nasal spray for drainage and nasal congestion - but do NOT use longer then 3-4 days  -can use tylenol or ibuprofen as directed for aches and sorethroat if no liver or kidney disease  -in the winter time, using a humidifier at night is helpful (please follow cleaning instructions)  -if you are taking a cough medication - use only as directed, may also try a teaspoon of honey to coat the throat and throat lozenges  -for sore throat, salt water gargles can help  -follow up if you have fevers, facial pain, tooth pain, difficulty breathing or are worsening or not getting better in 5-7 days

## 2013-08-02 ENCOUNTER — Encounter: Payer: Self-pay | Admitting: Internal Medicine

## 2013-08-02 ENCOUNTER — Ambulatory Visit (INDEPENDENT_AMBULATORY_CARE_PROVIDER_SITE_OTHER): Payer: 59 | Admitting: Internal Medicine

## 2013-08-02 ENCOUNTER — Other Ambulatory Visit (INDEPENDENT_AMBULATORY_CARE_PROVIDER_SITE_OTHER): Payer: 59

## 2013-08-02 VITALS — BP 128/80 | HR 64 | Temp 98.2°F | Ht 65.0 in | Wt 194.4 lb

## 2013-08-02 DIAGNOSIS — J3089 Other allergic rhinitis: Secondary | ICD-10-CM | POA: Insufficient documentation

## 2013-08-02 DIAGNOSIS — Z0000 Encounter for general adult medical examination without abnormal findings: Secondary | ICD-10-CM

## 2013-08-02 DIAGNOSIS — E669 Obesity, unspecified: Secondary | ICD-10-CM | POA: Insufficient documentation

## 2013-08-02 DIAGNOSIS — E785 Hyperlipidemia, unspecified: Secondary | ICD-10-CM

## 2013-08-02 DIAGNOSIS — J310 Chronic rhinitis: Secondary | ICD-10-CM

## 2013-08-02 LAB — BASIC METABOLIC PANEL
BUN: 15 mg/dL (ref 6–23)
CALCIUM: 9.7 mg/dL (ref 8.4–10.5)
CO2: 31 mEq/L (ref 19–32)
Chloride: 102 mEq/L (ref 96–112)
Creatinine, Ser: 0.8 mg/dL (ref 0.4–1.2)
GFR: 76.05 mL/min (ref >60.00)
GLUCOSE: 86 mg/dL (ref 70–99)
POTASSIUM: 4 meq/L (ref 3.5–5.1)
Sodium: 141 mEq/L (ref 135–145)

## 2013-08-02 LAB — CBC WITH DIFFERENTIAL/PLATELET
Basophils Absolute: 0 10*3/uL (ref 0.0–0.1)
Basophils Relative: 0.3 % (ref 0.0–3.0)
Eosinophils Absolute: 0.1 10*3/uL (ref 0.0–0.7)
Eosinophils Relative: 2.1 % (ref 0.0–5.0)
HCT: 41.9 % (ref 36.0–46.0)
HEMOGLOBIN: 13.9 g/dL (ref 12.0–15.0)
LYMPHS ABS: 1.2 10*3/uL (ref 0.7–4.0)
Lymphocytes Relative: 17 % (ref 12.0–46.0)
MCHC: 33.3 g/dL (ref 30.0–36.0)
MCV: 89.7 fl (ref 78.0–100.0)
Monocytes Absolute: 0.6 10*3/uL (ref 0.1–1.0)
Monocytes Relative: 8.6 % (ref 3.0–12.0)
NEUTROS ABS: 5 10*3/uL (ref 1.4–7.7)
Neutrophils Relative %: 72 % (ref 43.0–77.0)
PLATELETS: 319 10*3/uL (ref 150.0–400.0)
RBC: 4.66 Mil/uL (ref 3.87–5.11)
RDW: 13.6 % (ref 11.5–15.5)
WBC: 7 10*3/uL (ref 4.0–10.5)

## 2013-08-02 LAB — HEPATIC FUNCTION PANEL
ALBUMIN: 3.8 g/dL (ref 3.5–5.2)
ALT: 20 U/L (ref 0–35)
AST: 28 U/L (ref 0–37)
Alkaline Phosphatase: 100 U/L (ref 39–117)
Bilirubin, Direct: 0.1 mg/dL (ref 0.0–0.3)
Total Bilirubin: 0.8 mg/dL (ref 0.2–1.2)
Total Protein: 7.1 g/dL (ref 6.0–8.3)

## 2013-08-02 LAB — LIPID PANEL
CHOLESTEROL: 233 mg/dL — AB (ref 0–200)
HDL: 56.5 mg/dL (ref >39.00)
LDL Cholesterol: 153 mg/dL — ABNORMAL HIGH (ref 0–99)
TRIGLYCERIDES: 116 mg/dL (ref 0.0–149.0)
Total CHOL/HDL Ratio: 4
VLDL: 23.2 mg/dL (ref 0.0–40.0)

## 2013-08-02 LAB — TSH: TSH: 2.58 u[IU]/mL (ref 0.35–4.50)

## 2013-08-02 NOTE — Progress Notes (Signed)
   Subjective:    Patient ID: De Burrs, female    DOB: May 17, 1955, 58 y.o.   MRN: [id number]  HPI  She  is here for a physical;acute issues denied.  A heart healthy diet is followed; exercise encompasses 30-40 minutes 4 times per week as  walking without symptoms.  Family history is unknown. Advanced cholesterol testing reveals  LDL goal is less than 100 ; ideally < 70 . There is medication  Non compliance with the statin.  Low dose ASA taken.    Review of Systems  Specifically denied are  chest pain, palpitations, dyspnea, or claudication.  Significant abdominal symptoms, memory deficit, or myalgias not present off statin.      Objective:   Physical Exam Gen.: Healthy and well-nourished in appearance. Alert, appropriate and cooperative throughout exam. Appears younger than stated age  Head: Normocephalic without obvious abnormalities Eyes: No corneal or conjunctival inflammation noted. Pupils equal round reactive to light and accommodation. Extraocular motion intact. Fundal exam is benign without hemorrhages, exudate, papilledema.  Vision grossly normal with /w/o lenses Ears: External  ear exam reveals no significant lesions or deformities. Canals clear .TMs normal. Hearing is grossly normal bilaterally. Nose: External nasal exam reveals no deformity or inflammation. Nasal mucosa are pink and moist. No lesions or exudates noted.   Mouth: Oral mucosa and oropharynx reveal no lesions or exudates. Teeth in good repair. Neck: No deformities, masses, or tenderness noted. Range of motion decreased. Thyroid thyroid. ? L cervical rib Lungs: Normal respiratory effort; chest expands symmetrically. Lungs are clear to auscultation without rales, wheezes, or increased work of breathing. Heart: Normal rate and rhythm. Normal S1 and S2. No gallop, click, or rub. S4 with slurring murmur. Abdomen: Bowel sounds normal; abdomen soft and nontender. No masses, organomegaly or hernias  noted. Genitalia: as per Gyn                                  Musculoskeletal/extremities: No deformity or scoliosis noted of  the thoracic or lumbar spine. No clubbing, cyanosis, edema, or significant extremity  deformity noted. Range of motion normal .Tone & strength normal. Hand joints normal. Mild crepitus knees. Fingernail health good. Able to lie down & sit up w/o help. Negative SLR bilaterally Vascular: Carotid, radial artery, dorsalis pedis and  posterior tibial pulses are full and equal. No bruits present. Neurologic: Alert and oriented x3. Deep tendon reflexes symmetrical and normal.  Gait normal . Skin: Intact without suspicious lesions or rashes. Lymph: No cervical, axillary lymphadenopathy present. Psych: Mood and affect are normal. Normally interactive                                                                                        Assessment & Plan:  #1 comprehensive physical exam; no acute findings  Plan: see Orders  & Recommendations

## 2013-08-02 NOTE — Progress Notes (Signed)
Pre visit review using our clinic review tool, if applicable. No additional management support is needed unless otherwise documented below in the visit note. 

## 2013-08-02 NOTE — Patient Instructions (Signed)
Your next office appointment will be determined based upon review of your pending labs . Those instructions will be transmitted to you through My Chart   Plain Mucinex (NOT D) for thick secretions ;force NON dairy fluids .   Nasal cleansing in the shower as discussed with lather of mild shampoo.After 10 seconds wash off lather while  exhaling through nostrils. Make sure that all residual soap is removed to prevent irritation.  Flonase OR Nasacort AQ 1 spray in each nostril twice a day as needed. Use the "crossover" technique into opposite nostril spraying toward opposite ear @ 45 degree angle, not straight up into nostril.  Use a Neti pot daily only  as needed for significant sinus congestion; going from open side to congested side . Plain Allegra (NOT D )  160 daily , Loratidine 10 mg , OR Zyrtec 10 mg @ bedtime  as needed for itchy eyes & sneezing. 

## 2013-08-07 ENCOUNTER — Other Ambulatory Visit: Payer: Self-pay

## 2013-08-07 DIAGNOSIS — R030 Elevated blood-pressure reading, without diagnosis of hypertension: Secondary | ICD-10-CM

## 2013-08-07 MED ORDER — HYDROCHLOROTHIAZIDE 12.5 MG PO CAPS: ORAL_CAPSULE | ORAL | Status: DC

## 2013-11-05 ENCOUNTER — Ambulatory Visit (INDEPENDENT_AMBULATORY_CARE_PROVIDER_SITE_OTHER)
Admission: RE | Admit: 2013-11-05 | Discharge: 2013-11-05 | Disposition: A | Discharge status: Home / Self Care | Payer: No Typology Code available for payment source | Source: Ambulatory Visit | Attending: Internal Medicine | Admitting: Internal Medicine

## 2013-11-05 ENCOUNTER — Encounter: Payer: Self-pay | Admitting: Internal Medicine

## 2013-11-05 ENCOUNTER — Ambulatory Visit (INDEPENDENT_AMBULATORY_CARE_PROVIDER_SITE_OTHER): Payer: No Typology Code available for payment source | Admitting: Internal Medicine

## 2013-11-05 ENCOUNTER — Other Ambulatory Visit (INDEPENDENT_AMBULATORY_CARE_PROVIDER_SITE_OTHER): Payer: No Typology Code available for payment source

## 2013-11-05 VITALS — BP 126/86 | HR 71 | Temp 98.1°F | Wt 194.5 lb

## 2013-11-05 DIAGNOSIS — R1319 Other dysphagia: Secondary | ICD-10-CM

## 2013-11-05 DIAGNOSIS — K2100 Gastro-esophageal reflux disease with esophagitis, without bleeding: Secondary | ICD-10-CM

## 2013-11-05 DIAGNOSIS — R072 Precordial pain: Secondary | ICD-10-CM

## 2013-11-05 LAB — CBC WITH DIFFERENTIAL/PLATELET
Basophils Absolute: 0 10*3/uL (ref 0.0–0.1)
Basophils Relative: 0.4 % (ref 0.0–3.0)
EOS PCT: 1.7 % (ref 0.0–5.0)
Eosinophils Absolute: 0.1 10*3/uL (ref 0.0–0.7)
HCT: 40.2 % (ref 36.0–46.0)
Hemoglobin: 13.5 g/dL (ref 12.0–15.0)
Lymphocytes Relative: 14.4 % (ref 12.0–46.0)
Lymphs Abs: 1.3 10*3/uL (ref 0.7–4.0)
MCHC: 33.6 g/dL (ref 30.0–36.0)
MCV: 88.2 fl (ref 78.0–100.0)
MONOS PCT: 7.5 % (ref 3.0–12.0)
Monocytes Absolute: 0.7 10*3/uL (ref 0.1–1.0)
NEUTROS PCT: 76 % (ref 43.0–77.0)
Neutro Abs: 6.7 10*3/uL (ref 1.4–7.7)
PLATELETS: 288 10*3/uL (ref 150.0–400.0)
RBC: 4.56 Mil/uL (ref 3.87–5.11)
RDW: 13.2 % (ref 11.5–15.5)
WBC: 8.8 10*3/uL (ref 4.0–10.5)

## 2013-11-05 MED ORDER — SUCRALFATE 1 G PO TABS: 1.0000 g | ORAL_TABLET | Freq: Three times a day (TID) | ORAL | Status: DC

## 2013-11-05 NOTE — Patient Instructions (Signed)
Reflux of gastric acid may be asymptomatic as this may occur mainly during sleep.The triggers for reflux  include stress; the "aspirin family" ; alcohol; peppermint; and caffeine (coffee, tea, cola, and chocolate). The aspirin family would include aspirin and the nonsteroidal agents such as ibuprofen &  Naproxen. Tylenol would not cause reflux. If having symptoms ; food & drink should be avoided for @ least 2 hours before going to bed.   Your next office appointment will be determined based upon review of your pending labs & x-rays. Those instructions will be transmitted to you through My Chart. Followup as needed for your acute issue. Please report any significant change in your symptoms.

## 2013-11-05 NOTE — Progress Notes (Signed)
Pre visit review using our clinic review tool, if applicable. No additional management support is needed unless otherwise documented below in the visit note. 

## 2013-11-05 NOTE — Progress Notes (Signed)
**Note Dana-Identified via Obfuscation** Subjective:    Patient ID: Dana Mccoy, female    DOB: 12-11-1955, 58 y.o.   MRN: [id number]  HPI    She has had symptoms of chest pain intermittently for the last 3 weeks. It started during a 12 hour to [place]. She states the driving was very hectic and she felt like she had a near panic attack requiring her to pull over to the  side of the road. She was drinking coffee to stay awake and felt dramatically stressed.  She would stop every 2-3 hours and walk around.  Since that time she's had aching, soreness discomfort in the substernal area inferiorly. This has been associated with  "flip flopping" of her heart.   She describes intermittent orthopnea at rest.  She questioned anxiety and increased her Prozac to 20 mg daily from 10 with no significant benefit over a few days.  1 week ago she increased her omeprazole 40 mg to  twice a day with some benefit  In addition to the coffee excess she does have occasional triggers of aspirin, nonsteroidal, and spicy foods.  She describes dysphagia several times a day daily for the last 3 weeks. She also has dyspepsia.      Review of Systems  She has no hoarseness,  abdominal pain, melena,or rectal bleeding. No PNDyspnea or hemoptysis.       Objective:   Physical Exam Gen.: Healthy and well-nourished in appearance. Alert, appropriate and cooperative throughout exam. Appears younger than stated age  Head: Normocephalic without obvious abnormalities  Eyes: No corneal or conjunctival inflammation noted. Pupils equal round reactive to light and accommodation. Extraocular motion intact.  Ears: External  ear exam reveals no significant lesions or deformities. Canals clear .TMs normal. Hearing is grossly normal bilaterally. Nose: External nasal exam reveals no deformity or inflammation. Nasal mucosa are pink and moist. No lesions or exudates noted.   Mouth: Oral mucosa and oropharynx reveal no lesions or exudates. Teeth in good  repair. Neck: No deformities, masses, or tenderness noted. Range of motion & Thyroid normal Lungs: Normal respiratory effort; chest expands symmetrically. Lungs are clear to auscultation without rales, wheezes, or increased work of breathing. Heart: Normal rate and rhythm. Normal S1 and S2. No gallop, click, or rub. No murmur. Abdomen: Bowel sounds normal; abdomen soft and nontender. No masses, organomegaly or hernias noted.                             Musculoskeletal/extremities: No deformity or scoliosis noted of  the thoracic or lumbar spine. No clubbing, cyanosis, edema, or significant extremity  deformity noted. Range of motion normal .Tone & strength normal. Hand joints normal.  Fingernail health good.Homan's negative bilaterally. Able to lie down & sit up w/o help. Negative SLR bilaterally Vascular: Carotid, radial artery, dorsalis pedis and  posterior tibial pulses are full and equal. No bruits present. Neurologic: Alert and oriented x3. Deep tendon reflexes symmetrical and normal.  Gait normal .       Skin: Intact without suspicious lesions or rashes. Lymph: No cervical, axillary lymphadenopathy present. Psych: Mood and affect are normal. Normally interactive .She is not an anxious individual.  Assessment & Plan:  #1 substernal chest discomfort in the context of prolonged travel and significant reflux  #2 dysphagia  Plan: It is essential to rule out pulmonary thromboemboli due to his history. Xarelto will not be initiated because of the dysphagia and the risk potential GI bleed.  See orders and recommendations

## 2013-11-06 ENCOUNTER — Other Ambulatory Visit: Payer: Self-pay | Admitting: Internal Medicine

## 2013-11-06 DIAGNOSIS — K2100 Gastro-esophageal reflux disease with esophagitis, without bleeding: Secondary | ICD-10-CM

## 2013-11-06 LAB — D-DIMER, QUANTITATIVE (NOT AT ARMC): D DIMER QUANT: 0.29 ug{FEU}/mL (ref 0.00–0.48)

## 2013-11-06 LAB — TROPONIN I

## 2013-12-02 ENCOUNTER — Encounter: Payer: Self-pay | Admitting: Internal Medicine

## 2013-12-02 ENCOUNTER — Ambulatory Visit (INDEPENDENT_AMBULATORY_CARE_PROVIDER_SITE_OTHER): Payer: No Typology Code available for payment source | Admitting: Internal Medicine

## 2013-12-02 ENCOUNTER — Ambulatory Visit: Payer: Self-pay | Admitting: Internal Medicine

## 2013-12-02 VITALS — BP 152/80 | Temp 98.2°F | Wt 192.0 lb

## 2013-12-02 DIAGNOSIS — K219 Gastro-esophageal reflux disease without esophagitis: Secondary | ICD-10-CM

## 2013-12-02 DIAGNOSIS — R1310 Dysphagia, unspecified: Secondary | ICD-10-CM

## 2013-12-02 DIAGNOSIS — R110 Nausea: Secondary | ICD-10-CM

## 2013-12-02 NOTE — Patient Instructions (Signed)
Followup with your PCP, and GI  Avoids foods high in acid such as tomatoes citrus juices, and spicy foods.  Avoid eating within two hours of lying down or before exercising.  Do not overheat.  Try smaller more frequent meals.    Call or return to clinic prn if these symptoms worsen or fail to improve as anticipated.

## 2013-12-02 NOTE — Progress Notes (Signed)
**Note Dana-Identified via Obfuscation** Subjective:    Patient ID: Dana Mccoy, female    DOB: 1955-11-25, 58 y.o.   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  Complaints include nausea, intermittent diarrhea and chief complaint of some swallowing difficulties.  She is on PPI therapy, as well as Carafate.  She does have a GI followup visit scheduled in 3 days.  No vomiting She describes a long difficulty with solids and liquids. She has been under some situational stress and her Prozac has been increased from 10-20 mg daily about one month ago. She has had some fairly recent laboratory studies that are unremarkable.  No weight loss No painful swallowing She is on a very good.  Antireflux diet and largely avoidance of caffeinated products  Past Medical History  Diagnosis Date  . GERD (gastroesophageal reflux disease)   . Reactive airway disease     post CAP  . Hyperlipidemia     History   Social History  . Marital Status: Divorced    Spouse Name: N/A    Number of Children: N/A  . Years of Education: N/A   Occupational History  . Not on file.   Social History Main Topics  . Smoking status: Former Smoker    Quit date: 03/14/1984  . Smokeless tobacco: Never Used     Comment: smoked 1975-1986, up to 1.5 ppd  . Alcohol Use: Yes     Comment:  VERY rarely  . Drug Use: No  . Sexual Activity: Not on file   Other Topics Concern  . Not on file   Social History Narrative  . No narrative on file    Past Surgical History  Procedure Laterality Date  . Dilation and curettage of uterus    . Wisdom tooth extraction    . Colonoscopy  2007    Dr Cristina Gong  . Upper gi endoscopy      X 2  . Cardiac catheterization  1997    negative    Family History  Problem Relation Age of Onset  . Adopted: Yes    Allergies  Allergen  Reactions  . Penicillins     RASH  . Lipitor [Atorvastatin]     Cognitive impact    Current Outpatient Prescriptions on File Prior to Visit  Medication Sig Dispense Refill  . acyclovir (ZOVIRAX) 400 MG tablet Take 400 mg by mouth daily. Patient states she has a 500mg  tab (not an option in epic), filled by another MD      . albuterol (PROVENTIL HFA) 108 (90 BASE) MCG/ACT inhaler 1-2 puffs every 6 hours as needed  1 Inhaler  5  . Ascorbic Acid (VITAMIN C PO) Take 1 tablet by mouth daily.      . clobetasol cream (TEMOVATE) 2.99 % Apply 1 application topically daily.      Marland Kitchen EPINEPHrine (EPIPEN 2-PAK) 0.3 mg/0.3 mL DEVI Inject 0.3 mg into the muscle once.      . ferrous sulfate 325 (65 FE) MG tablet Take 325 mg by mouth as needed.       Marland Kitchen Fexofenadine HCl (ALLEGRA PO) Take by mouth.      Marland Kitchen FLUoxetine (PROZAC) 10 MG tablet Take 10 mg by mouth daily.        . fluticasone (FLONASE) 50  MCG/ACT nasal spray Place 2 sprays into both nostrils daily.  16 g  0  . Fluticasone-Salmeterol (ADVAIR) 250-50 MCG/DOSE AEPB Inhale 1 puff into the lungs every 12 (twelve) hours.  60 each  5  . hydrochlorothiazide (MICROZIDE) 12.5 MG capsule Take 1 capsule daily  30 capsule  5  . Multiple Vitamin (MULTIVITAMIN) tablet Take 1 tablet by mouth daily.      Marland Kitchen omeprazole (PRILOSEC) 40 MG capsule Take 40 mg by mouth 2 (two) times daily.      . sucralfate (CARAFATE) 1 G tablet Take 1 tablet (1 g total) by mouth 4 (four) times daily -  with meals and at bedtime.  60 tablet  1   No current facility-administered medications on file prior to visit.    BP 152/80  Temp(Src) 98.2 F (36.8 C) (Oral)  Wt 192 lb (87.091 kg)      Review of Systems  Constitutional: Positive for fatigue.  HENT: Negative for congestion, dental problem, hearing loss, rhinorrhea, sinus pressure, sore throat and tinnitus.   Eyes: Negative for pain, discharge and visual disturbance.  Respiratory: Negative for cough and shortness of breath.     Cardiovascular: Negative for chest pain, palpitations and leg swelling.  Gastrointestinal: Positive for nausea and diarrhea. Negative for vomiting, abdominal pain, constipation, blood in stool and abdominal distention.  Genitourinary: Negative for dysuria, urgency, frequency, hematuria, flank pain, vaginal bleeding, vaginal discharge, difficulty urinating, vaginal pain and pelvic pain.  Musculoskeletal: Negative for arthralgias, gait problem and joint swelling.  Skin: Negative for rash.  Neurological: Negative for dizziness, syncope, speech difficulty, weakness, numbness and headaches.  Hematological: Negative for adenopathy.  Psychiatric/Behavioral: Negative for behavioral problems, dysphoric mood and agitation. The patient is not nervous/anxious.        Objective:   Physical Exam  Constitutional: She is oriented to person, place, and time. She appears well-developed and well-nourished.  Mildly obese No distress Blood pressure 140/80  HENT:  Head: Normocephalic.  Right Ear: External ear normal.  Left Ear: External ear normal.  Mouth/Throat: Oropharynx is clear and moist.  Eyes: Conjunctivae and EOM are normal. Pupils are equal, round, and reactive to light.  Neck: Normal range of motion. Neck supple. No thyromegaly present.  Cardiovascular: Normal rate, regular rhythm, normal heart sounds and intact distal pulses.   Pulmonary/Chest: Effort normal and breath sounds normal.  Abdominal: Soft. Bowel sounds are normal. She exhibits no mass. There is no tenderness. There is no rebound and no guarding.  Musculoskeletal: Normal range of motion.  Lymphadenopathy:    She has no cervical adenopathy.  Neurological: She is alert and oriented to person, place, and time.  Skin: Skin is warm and dry. No rash noted.  Psychiatric: She has a normal mood and affect. Her behavior is normal.          Assessment & Plan:   GERD Multiple GI complaints.  GI followup as scheduled in 3 days.   Continue aggressive antireflux diet and present regimen History depression.  Recent increase in Prozac to 20 mg daily.  We'll continue  GI followup Followup PCP as scheduled

## 2013-12-02 NOTE — Progress Notes (Signed)
Pre visit review using our clinic review tool, if applicable. No additional management support is needed unless otherwise documented below in the visit note. 

## 2013-12-05 ENCOUNTER — Telehealth: Payer: Self-pay | Admitting: *Deleted

## 2013-12-05 ENCOUNTER — Other Ambulatory Visit: Payer: Self-pay | Admitting: Gastroenterology

## 2013-12-05 DIAGNOSIS — R1310 Dysphagia, unspecified: Secondary | ICD-10-CM

## 2013-12-05 NOTE — Telephone Encounter (Signed)
Left msg on triage stating pt is been having issues with panic attack. Wanting to see can md rx some klonopin. Called pt inform her that we received a call from her therapist 7 which she was aware that she would be calling, but inform her before md can rx something she will need to be seen. Made appt for tomorrow. Called [place] spoke with Delsa Sale her receptionist we have made appt foe pt to come in tomorrow...Johny Chess

## 2013-12-06 ENCOUNTER — Ambulatory Visit (INDEPENDENT_AMBULATORY_CARE_PROVIDER_SITE_OTHER): Payer: No Typology Code available for payment source | Admitting: Internal Medicine

## 2013-12-06 ENCOUNTER — Encounter: Payer: Self-pay | Admitting: Internal Medicine

## 2013-12-06 VITALS — BP 140/88 | HR 58 | Temp 98.3°F | Wt 192.4 lb

## 2013-12-06 DIAGNOSIS — F410 Panic disorder [episodic paroxysmal anxiety] without agoraphobia: Secondary | ICD-10-CM

## 2013-12-06 DIAGNOSIS — F411 Generalized anxiety disorder: Secondary | ICD-10-CM

## 2013-12-06 DIAGNOSIS — R4589 Other symptoms and signs involving emotional state: Secondary | ICD-10-CM

## 2013-12-06 DIAGNOSIS — K219 Gastro-esophageal reflux disease without esophagitis: Secondary | ICD-10-CM

## 2013-12-06 DIAGNOSIS — F430 Acute stress reaction: Principal | ICD-10-CM

## 2013-12-06 MED ORDER — CLONAZEPAM 0.5 MG PO TABS: ORAL_TABLET | ORAL | Status: DC

## 2013-12-06 NOTE — Progress Notes (Signed)
**Note Dana-Identified via Obfuscation**    Subjective:    Patient ID: Dana Mccoy, female    DOB: 11-May-1955, 58 y.o.   MRN: [id number]  HPI   She has been seeing a counselor, Dr. Tonye Royalty for panic attacks which have been exacerbated in the context of family stress.  She had a past history of panic attacks in 1997 and was on Xanax as needed. She had no difficulty coming off the medicine at that time.  Dr. Tonye Royalty has recommended clonazepam 0.25 mg three times a day as needed.  The panic attacks are manifested as fear as she gets on the highway as her initial episode occurred while driving.  Her mother may have been an alcoholic and had anxiety. She is adopted. She believes her mother died of gastric cancer.  Her gynecologist has increased her fluoxetine from 10 mg to 20 mg daily  TSH was 2.58 on 08/02/13.  Review of Systems  She is to have a barium swallow from her gastroenterologist. She was placed on Dexilant in the morning and Zegerid at night for reflux symptoms.        Objective:   Physical Exam  Gen.:  well-nourished; in no acute distress Eyes: Extraocular motion intact; no lid lag or proptosis ,nystagmus Neck: full ROM; no masses ; thyroid normal  Heart: Normal rhythm and rate without significant murmur, gallop, or extra heart sounds Lungs: Chest clear to auscultation without rales,rales, wheezes Neuro:Deep tendon reflexes are equal and within normal limits; no tremor  Skin: Warm and dry without significant lesions or rashes; no onycholysis Lymphatic: no cervical or axillary LA Psych: Normally communicative and interactive; no abnormal mood or affect clinically.           Assessment & Plan:  #1 panic attacks The pathophysiology of neurotransmitter deficiency was discussed along with the benefits and potential adverse effects of SSRI therapy.

## 2013-12-06 NOTE — Progress Notes (Signed)
Pre visit review using our clinic review tool, if applicable. No additional management support is needed unless otherwise documented below in the visit note. 

## 2013-12-06 NOTE — Patient Instructions (Signed)
As we discussed the neurotransmitters are essential for good brain function, both intellectually & emotionally. The agents to increase the neurotransmitter levels are not addictive and simply keep this essential neurotransmitter at therapeutic levels. If these levels become severely depleted; depression or panic attacks can occur.

## 2013-12-08 DIAGNOSIS — F419 Anxiety disorder, unspecified: Secondary | ICD-10-CM | POA: Insufficient documentation

## 2013-12-08 NOTE — Assessment & Plan Note (Signed)
Clonazepam 0.5 mg 1/2 tid prn

## 2013-12-08 NOTE — Assessment & Plan Note (Signed)
Dual therapy Ba swallow pending

## 2013-12-12 ENCOUNTER — Other Ambulatory Visit: Payer: Self-pay

## 2013-12-12 MED ORDER — FLUTICASONE-SALMETEROL 250-50 MCG/DOSE IN AEPB: 1.0000 | INHALATION_SPRAY | Freq: Two times a day (BID) | RESPIRATORY_TRACT | Status: DC

## 2013-12-16 ENCOUNTER — Telehealth: Payer: Self-pay

## 2013-12-16 MED ORDER — MOMETASONE FURO-FORMOTEROL FUM 200-5 MCG/ACT IN AERO: INHALATION_SPRAY | RESPIRATORY_TRACT | Status: DC

## 2013-12-16 NOTE — Telephone Encounter (Signed)
Received a fax from the pharmacy stating Advair is very expensive for the patient. Ruthe Mannan is preferred. Okay for change?

## 2013-12-16 NOTE — Telephone Encounter (Signed)
Dulera 1-2 inhalations every 12 hrs in place of Advair Refill X 3 mos

## 2013-12-18 ENCOUNTER — Other Ambulatory Visit: Payer: Self-pay

## 2013-12-18 ENCOUNTER — Other Ambulatory Visit: Payer: Self-pay | Admitting: Gastroenterology

## 2013-12-18 ENCOUNTER — Ambulatory Visit
Admission: RE | Admit: 2013-12-18 | Discharge: 2013-12-18 | Disposition: A | Discharge status: Home / Self Care | Payer: No Typology Code available for payment source | Source: Ambulatory Visit | Attending: Gastroenterology | Admitting: Gastroenterology

## 2013-12-18 DIAGNOSIS — R1310 Dysphagia, unspecified: Secondary | ICD-10-CM

## 2014-01-06 ENCOUNTER — Other Ambulatory Visit: Payer: Self-pay | Admitting: Internal Medicine

## 2014-01-06 DIAGNOSIS — Z1231 Encounter for screening mammogram for malignant neoplasm of breast: Secondary | ICD-10-CM

## 2014-01-16 ENCOUNTER — Ambulatory Visit (HOSPITAL_COMMUNITY)
Admission: RE | Admit: 2014-01-16 | Discharge: 2014-01-16 | Disposition: A | Discharge status: Home / Self Care | Payer: No Typology Code available for payment source | Source: Ambulatory Visit | Attending: Internal Medicine | Admitting: Internal Medicine

## 2014-01-16 DIAGNOSIS — Z1231 Encounter for screening mammogram for malignant neoplasm of breast: Secondary | ICD-10-CM | POA: Insufficient documentation

## 2014-01-23 ENCOUNTER — Other Ambulatory Visit: Payer: Self-pay | Admitting: Internal Medicine

## 2014-01-23 DIAGNOSIS — F410 Panic disorder [episodic paroxysmal anxiety] without agoraphobia: Secondary | ICD-10-CM

## 2014-01-23 DIAGNOSIS — F430 Acute stress reaction: Principal | ICD-10-CM

## 2014-01-23 MED ORDER — CLONAZEPAM 0.5 MG PO TABS: ORAL_TABLET | ORAL | Status: DC

## 2014-01-23 NOTE — Telephone Encounter (Signed)
Pt is requesting refill of Clonazepam. [facility]. Pt requesting refill when she takes it she takes 1/4 tablet two or three times per week.

## 2014-01-23 NOTE — Telephone Encounter (Signed)
Clonazepam has been called to [id number] 

## 2014-01-23 NOTE — Telephone Encounter (Signed)
#  30 q 12 hrs prn

## 2014-01-27 ENCOUNTER — Other Ambulatory Visit: Payer: Self-pay

## 2014-01-27 DIAGNOSIS — R030 Elevated blood-pressure reading, without diagnosis of hypertension: Secondary | ICD-10-CM

## 2014-01-27 MED ORDER — HYDROCHLOROTHIAZIDE 12.5 MG PO CAPS: ORAL_CAPSULE | ORAL | Status: DC

## 2014-06-10 ENCOUNTER — Other Ambulatory Visit: Payer: Self-pay | Admitting: Obstetrics & Gynecology

## 2014-06-30 ENCOUNTER — Other Ambulatory Visit: Payer: Self-pay | Admitting: Internal Medicine

## 2014-08-05 ENCOUNTER — Other Ambulatory Visit: Payer: Self-pay | Admitting: *Deleted

## 2014-08-05 DIAGNOSIS — R030 Elevated blood-pressure reading, without diagnosis of hypertension: Secondary | ICD-10-CM

## 2014-08-05 MED ORDER — HYDROCHLOROTHIAZIDE 12.5 MG PO CAPS: ORAL_CAPSULE | ORAL | Status: DC

## 2014-08-27 ENCOUNTER — Encounter: Payer: No Typology Code available for payment source | Admitting: Internal Medicine

## 2014-09-05 ENCOUNTER — Ambulatory Visit (INDEPENDENT_AMBULATORY_CARE_PROVIDER_SITE_OTHER): Payer: No Typology Code available for payment source | Admitting: Internal Medicine

## 2014-09-05 ENCOUNTER — Other Ambulatory Visit: Payer: Self-pay | Admitting: Internal Medicine

## 2014-09-05 ENCOUNTER — Encounter: Payer: Self-pay | Admitting: Internal Medicine

## 2014-09-05 ENCOUNTER — Other Ambulatory Visit (INDEPENDENT_AMBULATORY_CARE_PROVIDER_SITE_OTHER): Payer: No Typology Code available for payment source

## 2014-09-05 VITALS — BP 126/84 | HR 60 | Temp 97.8°F | Resp 16 | Ht 65.0 in | Wt 199.0 lb

## 2014-09-05 DIAGNOSIS — F410 Panic disorder [episodic paroxysmal anxiety] without agoraphobia: Secondary | ICD-10-CM

## 2014-09-05 DIAGNOSIS — F430 Acute stress reaction: Secondary | ICD-10-CM

## 2014-09-05 DIAGNOSIS — Z0000 Encounter for general adult medical examination without abnormal findings: Secondary | ICD-10-CM | POA: Diagnosis not present

## 2014-09-05 LAB — BASIC METABOLIC PANEL
BUN: 19 mg/dL (ref 6–23)
CALCIUM: 9.6 mg/dL (ref 8.4–10.5)
CO2: 32 meq/L (ref 19–32)
CREATININE: 0.72 mg/dL (ref 0.40–1.20)
Chloride: 104 mEq/L (ref 96–112)
GFR: 88.03 mL/min (ref >60.00)
Glucose, Bld: 86 mg/dL (ref 70–99)
Potassium: 3.9 mEq/L (ref 3.5–5.1)
Sodium: 141 mEq/L (ref 135–145)

## 2014-09-05 LAB — CBC WITH DIFFERENTIAL/PLATELET
BASOS PCT: 0.3 % (ref 0.0–3.0)
Basophils Absolute: 0 10*3/uL (ref 0.0–0.1)
EOS PCT: 2.7 % (ref 0.0–5.0)
Eosinophils Absolute: 0.2 10*3/uL (ref 0.0–0.7)
HCT: 41.3 % (ref 36.0–46.0)
Hemoglobin: 13.9 g/dL (ref 12.0–15.0)
LYMPHS PCT: 18.6 % (ref 12.0–46.0)
Lymphs Abs: 1.2 10*3/uL (ref 0.7–4.0)
MCHC: 33.6 g/dL (ref 30.0–36.0)
MCV: 88.1 fl (ref 78.0–100.0)
MONOS PCT: 8.2 % (ref 3.0–12.0)
Monocytes Absolute: 0.5 10*3/uL (ref 0.1–1.0)
Neutro Abs: 4.6 10*3/uL (ref 1.4–7.7)
Neutrophils Relative %: 70.2 % (ref 43.0–77.0)
Platelets: 325 10*3/uL (ref 150.0–400.0)
RBC: 4.69 Mil/uL (ref 3.87–5.11)
RDW: 13.3 % (ref 11.5–15.5)
WBC: 6.6 10*3/uL (ref 4.0–10.5)

## 2014-09-05 LAB — HEPATIC FUNCTION PANEL
ALBUMIN: 4 g/dL (ref 3.5–5.2)
ALT: 20 U/L (ref 0–35)
AST: 25 U/L (ref 0–37)
Alkaline Phosphatase: 105 U/L (ref 39–117)
BILIRUBIN DIRECT: 0 mg/dL (ref 0.0–0.3)
TOTAL PROTEIN: 6.9 g/dL (ref 6.0–8.3)
Total Bilirubin: 0.5 mg/dL (ref 0.2–1.2)

## 2014-09-05 LAB — TSH: TSH: 2.54 u[IU]/mL (ref 0.35–4.50)

## 2014-09-05 MED ORDER — CLONAZEPAM 0.5 MG PO TABS: ORAL_TABLET | ORAL | Status: DC

## 2014-09-05 NOTE — Progress Notes (Signed)
Pre visit review using our clinic review tool, if applicable. No additional management support is needed unless otherwise documented below in the visit note. 

## 2014-09-05 NOTE — Progress Notes (Signed)
**Note Dana-Identified via Obfuscation** Subjective:    Patient ID: Dana Mccoy, female    DOB: August 15, 1955, 59 y.o.   MRN: [id number]  HPI She is here for a physical;acute issues denied.   She is on a heart healthy, low-salt diet. She's not monitoring blood pressure. She walks or bikes 3 times a week 20-30 minutes without cardiopulmonary symptoms.   She has been compliant with her medications without adverse effects. Her Advair was changed to [facility]. She uses 2 puffs in the morning and has excellent control of her reactive airways disease. She rarely uses her rescue inhaler. She has perennial issues with triggers such as dust and fumes.  Her gynecologist has prescribed fluoxetine which she finds beneficial in controlling irritability.  She is on omeprazole from her gastroenterologist. Colonoscopy is due next year. She has no active GI symptoms at this time.      Review of Systems Chest pain, palpitations, tachycardia, exertional dyspnea, paroxysmal nocturnal dyspnea, claudication or edema are absent. No unexplained weight loss, abdominal pain, significant dyspepsia, dysphagia, melena, rectal bleeding, or persistently small caliber stools. Dysuria, pyuria, hematuria, frequency, nocturia or polyuria are denied. Change in hair, skin, nails denied. No bowel changes of constipation or diarrhea. No intolerance to heat or cold.  She does have cervical spine stenosis with intermittent stiffness and pain. She has no associated significant numbness, tingling, weakness. She has no bowel or urinary incontinence.     Objective:   Physical Exam  Gen.: Adequately nourished in appearance. Alert, appropriate and cooperative throughout exam. BMI:33.12 Appears younger than stated age  Head: Normocephalic without obvious abnormalities  Eyes: No corneal or conjunctival inflammation noted. Pupils equal round reactive to light and accommodation. Extraocular motion intact.  Ears: External  ear exam reveals no significant lesions or  deformities. Canals clear .TMs normal. Hearing is grossly normal bilaterally. Nose: External nasal exam reveals no deformity or inflammation. Nasal mucosa are pink and moist. No lesions or exudates noted.   Mouth: Oral mucosa and oropharynx reveal no lesions or exudates. Teeth in good repair. Neck: No deformities, masses, or tenderness noted. Range of motion decreased. Thyroid normal. Lungs: Normal respiratory effort; chest expands symmetrically. Lungs are clear to auscultation without rales, wheezes, or increased work of breathing. Heart: Normal rate and rhythm. Normal S1 and S2. No gallop, click, or rub. No murmur. Abdomen: Bowel sounds normal; abdomen soft and nontender. No masses, organomegaly or hernias noted. Genitalia: as per Gyn                                  Musculoskeletal/extremities: No deformity or scoliosis noted of  the thoracic or lumbar spine.  No clubbing, cyanosis, edema, or significant extremity  deformity noted.  Range of motion normal . Tone & strength normal. Hand joints normal Fingernail  health good. Slight instability of knees  Able to lie down & sit up w/o help.  Negative SLR bilaterally Vascular: Carotid, radial artery, dorsalis pedis and  posterior tibial pulses are full and equal. No bruits present. Neurologic: Alert and oriented x3. Deep tendon reflexes symmetrical and normal.  Gait normal     Skin: Intact without suspicious lesions or rashes. Lymph: No cervical, axillary lymphadenopathy present. Psych: Mood and affect are normal. Normally interactive  Assessment & Plan:  #1 comprehensive physical exam; no acute findings  Plan: see Orders  & Recommendations

## 2014-09-05 NOTE — Patient Instructions (Signed)
  Your next office appointment will be determined based upon review of your pending labs .  Those written interpretation of the lab results and instructions will be transmitted to you by My Chart  Critical results will be called.   Followup as needed for any active or acute issue. Please report any significant change in your symptoms.  Reflux of gastric acid may be asymptomatic as this may occur mainly during sleep.The triggers for reflux  include stress; the "aspirin family" ; alcohol; peppermint; and caffeine (coffee, tea, cola, and chocolate). The aspirin family would include aspirin and the nonsteroidal agents such as ibuprofen &  Naproxen. Tylenol would not cause reflux. If having symptoms ; food & drink should be avoided for @ least 2 hours before going to bed.  

## 2014-09-08 LAB — NMR LIPOPROFILE WITH LIPIDS
Cholesterol, Total: 260 mg/dL — ABNORMAL HIGH (ref 100–199)
HDL Particle Number: 34.5 umol/L (ref >=30.5)
HDL SIZE: 8.8 nm — AB (ref >=9.2)
HDL-C: 59 mg/dL (ref >39)
LDL CALC: 169 mg/dL — AB (ref 0–99)
LDL PARTICLE NUMBER: 2437 nmol/L — AB (ref <1000)
LDL SIZE: 20.7 nm (ref >=20.8)
LP-IR SCORE: 74 — AB (ref <=45)
Large HDL-P: 5 umol/L (ref >=4.8)
Large VLDL-P: 5.3 nmol/L — ABNORMAL HIGH (ref <=2.7)
Small LDL Particle Number: 1203 nmol/L — ABNORMAL HIGH (ref <=527)
TRIGLYCERIDES: 161 mg/dL — AB (ref 0–149)
VLDL SIZE: 55.6 nm — AB (ref <=46.6)

## 2014-10-14 ENCOUNTER — Encounter: Payer: Self-pay | Admitting: Cardiovascular Disease

## 2015-01-02 ENCOUNTER — Other Ambulatory Visit: Payer: Self-pay | Admitting: Orthopaedic Surgery

## 2015-01-02 DIAGNOSIS — M25511 Pain in right shoulder: Secondary | ICD-10-CM

## 2015-01-02 DIAGNOSIS — M542 Cervicalgia: Secondary | ICD-10-CM

## 2015-01-13 ENCOUNTER — Ambulatory Visit
Admission: RE | Admit: 2015-01-13 | Discharge: 2015-01-13 | Disposition: A | Discharge status: Home / Self Care | Payer: PRIVATE HEALTH INSURANCE | Source: Ambulatory Visit | Attending: Orthopaedic Surgery | Admitting: Orthopaedic Surgery

## 2015-01-13 DIAGNOSIS — M25511 Pain in right shoulder: Secondary | ICD-10-CM

## 2015-01-13 DIAGNOSIS — M542 Cervicalgia: Secondary | ICD-10-CM

## 2015-01-19 ENCOUNTER — Other Ambulatory Visit: Payer: Self-pay | Admitting: Internal Medicine

## 2015-03-13 ENCOUNTER — Other Ambulatory Visit: Payer: Self-pay

## 2015-03-13 ENCOUNTER — Telehealth: Payer: Self-pay | Admitting: Internal Medicine

## 2015-03-13 MED ORDER — FLUTICASONE FUROATE-VILANTEROL 100-25 MCG/INH IN AEPB: 1.0000 | INHALATION_SPRAY | Freq: Every day | RESPIRATORY_TRACT | Status: DC

## 2015-03-13 NOTE — Telephone Encounter (Signed)
Pt called and regarding the prescription for [facility]. She states insurance doesn't cover it and the pharmacy will contact us with the alternatives. She thinks they said Breo or Advair. I did get her scheduled with Dr. Quay Burow at the end of the month

## 2015-03-17 ENCOUNTER — Other Ambulatory Visit: Payer: Self-pay

## 2015-03-17 DIAGNOSIS — Z1231 Encounter for screening mammogram for malignant neoplasm of breast: Secondary | ICD-10-CM

## 2015-04-14 ENCOUNTER — Ambulatory Visit
Admission: RE | Admit: 2015-04-14 | Discharge: 2015-04-14 | Disposition: A | Discharge status: Home / Self Care | Payer: Managed Care, Other (non HMO) | Source: Ambulatory Visit

## 2015-04-14 ENCOUNTER — Ambulatory Visit: Payer: PRIVATE HEALTH INSURANCE | Admitting: Internal Medicine

## 2015-04-14 DIAGNOSIS — Z1231 Encounter for screening mammogram for malignant neoplasm of breast: Secondary | ICD-10-CM

## 2015-04-18 ENCOUNTER — Encounter (HOSPITAL_COMMUNITY): Payer: Self-pay | Admitting: *Deleted

## 2015-04-18 ENCOUNTER — Emergency Department (HOSPITAL_COMMUNITY)
Admission: EM | Admit: 2015-04-18 | Discharge: 2015-04-18 | Disposition: A | Discharge status: Home / Self Care | Payer: Managed Care, Other (non HMO) | Source: Home / Self Care | Attending: Emergency Medicine | Admitting: Emergency Medicine

## 2015-04-18 DIAGNOSIS — J069 Acute upper respiratory infection, unspecified: Secondary | ICD-10-CM

## 2015-04-18 HISTORY — DX: Essential (primary) hypertension: I10

## 2015-04-18 MED ORDER — PREDNISONE 50 MG PO TABS: ORAL_TABLET | ORAL | Status: DC

## 2015-04-18 MED ORDER — AZITHROMYCIN 250 MG PO TABS: ORAL_TABLET | ORAL | Status: DC

## 2015-04-18 NOTE — ED Notes (Signed)
C/O head and nasal congestion with green sputum x 4 days.  Had temp 100, has since resolved.  Hoarse voice, general malaise.  Has been taking phenylephrine, Aleve, Zyrtec.

## 2015-04-18 NOTE — ED Provider Notes (Signed)
CSN: [id number]     Arrival date & time 04/18/15  1701 History   First MD Initiated Contact with Patient 04/18/15 1714     Chief Complaint  Patient presents with  . Facial Pain  . Nasal Congestion   (Consider location/radiation/quality/duration/timing/severity/associated sxs/prior Treatment) HPI She is a 60 year old woman here for evaluation of sinus issues. She states her symptoms started about 4 days ago with nasal congestion, postnasal drainage, and sore throat. Her symptoms have gradually worsened. Today, she complains of sinus congestion and pressure, nasal congestion, and laryngitis. She is having green nasal discharge. She also reports an intermittent cough that is sometimes productive of green sputum. She reports a low-grade fever initially, but states this has resolved. She is drinking plenty of fluids. She has been taking Zyrtec, phenylephrine, and NyQuil with temporary improvement of symptoms.  Past Medical History  Diagnosis Date  . GERD (gastroesophageal reflux disease)   . Reactive airway disease     post CAP  . Hyperlipidemia   . Hypertension    Past Surgical History  Procedure Laterality Date  . Dilation and curettage of uterus    . Wisdom tooth extraction    . Colonoscopy  2007    Dr Cristina Gong  . Upper gi endoscopy      X 2  . Cardiac catheterization  1997    negative   Family History  Problem Relation Age of Onset  . Adopted: Yes   Social History  Substance Use Topics  . Smoking status: Former Smoker    Quit date: 03/14/1984  . Smokeless tobacco: Never Used     Comment: smoked 1975-1986, up to 1.5 ppd  . Alcohol Use: No   OB History    No data available     Review of Systems As in history of present illness Allergies  Penicillins and Lipitor  Home Medications   Prior to Admission medications   Medication Sig Start Date End Date Taking? Authorizing Provider  acyclovir (ZOVIRAX) 400 MG tablet Take 400 mg by mouth daily. Patient states she has a  500mg  tab (not an option in epic), filled by another MD   Yes Historical Provider, MD  Ascorbic Acid (VITAMIN C PO) Take 1 tablet by mouth daily.   Yes Historical Provider, MD  Cetirizine HCl (ZYRTEC ALLERGY) 10 MG CAPS Take 10 mg by mouth.   Yes Historical Provider, MD  FLUoxetine (PROZAC) 10 MG tablet Take 10 mg by mouth daily.     Yes Historical Provider, MD  fluticasone (FLONASE) 50 MCG/ACT nasal spray Place 2 sprays into both nostrils daily. 07/11/13  Yes Lucretia Kern, DO  Fluticasone Furoate-Vilanterol 100-25 MCG/INH AEPB Inhale 1 puff into the lungs daily. 03/13/15  Yes Hendricks Limes, MD  hydrochlorothiazide (MICROZIDE) 12.5 MG capsule TAKE 1 CAPSULE BY MOUTH ONCE DAILY 01/19/15  Yes Hendricks Limes, MD  Multiple Vitamin (MULTIVITAMIN) tablet Take 1 tablet by mouth daily.   Yes Historical Provider, MD  omeprazole (PRILOSEC) 40 MG capsule Take 40 mg by mouth daily.   Yes Historical Provider, MD  VENTOLIN HFA 108 (90 BASE) MCG/ACT inhaler INHALE 2 PUFFS EVERY 4 TO 6 HOURS AS NEEDED FOR COUGH OR WHEEZE *MAY USE 10-20 MIN PRIOR TO EXERCISE 06/30/14  Yes Hendricks Limes, MD  azithromycin (ZITHROMAX Z-PAK) 250 MG tablet Take 2 pills today, then 1 pill daily until gone. 04/18/15   Melony Overly, MD  EPINEPHrine (EPIPEN 2-PAK) 0.3 mg/0.3 mL DEVI Inject 0.3 mg into the muscle once.  Historical Provider, MD  predniSONE (DELTASONE) 50 MG tablet Take 1 pill daily for 5 days. 04/18/15   Melony Overly, MD   Meds Ordered and Administered this Visit  Medications - No data to display  BP 142/82 mmHg  Pulse 72  Temp(Src) 98.6 F (37 C) (Oral)  SpO2 95% No data found.   Physical Exam  Constitutional: She appears well-developed and well-nourished. No distress.  HENT:  Nose: Nose normal.  Mouth/Throat: Oropharynx is clear and moist. No oropharyngeal exudate.  TMs normal bilaterally  Neck: Neck supple.  Cardiovascular: Normal rate, regular rhythm and normal heart sounds.   No murmur  heard. Pulmonary/Chest: Effort normal. No respiratory distress. She has no wheezes. She has no rales.  Some mild coarseness in the upper lung fields  Lymphadenopathy:    She has no cervical adenopathy.    ED Course  Procedures (including critical care time)  Labs Review Labs Reviewed - No data to display  Imaging Review No results found.    MDM   1. Viral URI    This is likely viral at this time. Continue symptomatic treatment at home. We'll add 5 days of prednisone to help with inflammation and laryngitis. Prescription given for azithromycin to take if not improving in the next 2-3 days. Follow-up as needed.    Melony Overly, MD 04/18/15 [phone]

## 2015-04-18 NOTE — Discharge Instructions (Signed)
You likely have a viral upper respiratory and sinus infection. Please continue your Zyrtec daily. You can use the other over-the-counter agents as needed. Take prednisone daily for 5 days. This will help with the inflammation and congestion. If things are not getting better by Monday or Tuesday, please fill the prescription for azithromycin. Follow-up as needed.

## 2015-06-05 ENCOUNTER — Ambulatory Visit: Payer: PRIVATE HEALTH INSURANCE | Admitting: Internal Medicine

## 2015-07-20 ENCOUNTER — Encounter: Payer: Self-pay | Admitting: Internal Medicine

## 2015-07-21 MED ORDER — FLUTICASONE FUROATE-VILANTEROL 100-25 MCG/INH IN AEPB: 1.0000 | INHALATION_SPRAY | Freq: Every day | RESPIRATORY_TRACT | Status: DC

## 2015-07-29 ENCOUNTER — Other Ambulatory Visit: Payer: Self-pay | Admitting: Emergency Medicine

## 2015-07-29 MED ORDER — HYDROCHLOROTHIAZIDE 12.5 MG PO CAPS: 12.5000 mg | ORAL_CAPSULE | Freq: Every day | ORAL | Status: DC

## 2015-08-13 ENCOUNTER — Other Ambulatory Visit: Payer: Self-pay | Admitting: Internal Medicine

## 2015-08-13 ENCOUNTER — Other Ambulatory Visit: Payer: Self-pay | Admitting: *Deleted

## 2015-08-13 MED ORDER — FLUTICASONE FUROATE-VILANTEROL 100-25 MCG/INH IN AEPB: INHALATION_SPRAY | RESPIRATORY_TRACT | Status: DC

## 2015-08-14 ENCOUNTER — Other Ambulatory Visit: Payer: Self-pay | Admitting: Emergency Medicine

## 2015-08-14 MED ORDER — FLUTICASONE FUROATE-VILANTEROL 100-25 MCG/INH IN AEPB: INHALATION_SPRAY | RESPIRATORY_TRACT | Status: DC

## 2015-08-17 ENCOUNTER — Other Ambulatory Visit: Payer: Self-pay | Admitting: Obstetrics & Gynecology

## 2015-08-18 LAB — CYTOLOGY - PAP

## 2015-09-17 ENCOUNTER — Ambulatory Visit (INDEPENDENT_AMBULATORY_CARE_PROVIDER_SITE_OTHER): Payer: 59 | Admitting: Internal Medicine

## 2015-09-17 ENCOUNTER — Encounter: Payer: Self-pay | Admitting: Internal Medicine

## 2015-09-17 ENCOUNTER — Other Ambulatory Visit: Payer: Self-pay

## 2015-09-17 ENCOUNTER — Other Ambulatory Visit (INDEPENDENT_AMBULATORY_CARE_PROVIDER_SITE_OTHER): Payer: 59

## 2015-09-17 VITALS — BP 132/86 | HR 84 | Temp 98.4°F | Resp 16 | Ht 65.0 in | Wt 189.0 lb

## 2015-09-17 DIAGNOSIS — R030 Elevated blood-pressure reading, without diagnosis of hypertension: Secondary | ICD-10-CM | POA: Diagnosis not present

## 2015-09-17 DIAGNOSIS — Z0000 Encounter for general adult medical examination without abnormal findings: Secondary | ICD-10-CM

## 2015-09-17 DIAGNOSIS — E785 Hyperlipidemia, unspecified: Secondary | ICD-10-CM

## 2015-09-17 DIAGNOSIS — F419 Anxiety disorder, unspecified: Secondary | ICD-10-CM

## 2015-09-17 DIAGNOSIS — R059 Cough, unspecified: Secondary | ICD-10-CM

## 2015-09-17 DIAGNOSIS — M4802 Spinal stenosis, cervical region: Secondary | ICD-10-CM

## 2015-09-17 DIAGNOSIS — R05 Cough: Secondary | ICD-10-CM

## 2015-09-17 LAB — CBC WITH DIFFERENTIAL/PLATELET
BASOS ABS: 0 10*3/uL (ref 0.0–0.1)
BASOS PCT: 0.3 % (ref 0.0–3.0)
EOS PCT: 2.7 % (ref 0.0–5.0)
Eosinophils Absolute: 0.2 10*3/uL (ref 0.0–0.7)
HEMATOCRIT: 42.7 % (ref 36.0–46.0)
Hemoglobin: 14.4 g/dL (ref 12.0–15.0)
LYMPHS ABS: 1.2 10*3/uL (ref 0.7–4.0)
LYMPHS PCT: 14.7 % (ref 12.0–46.0)
MCHC: 33.6 g/dL (ref 30.0–36.0)
MCV: 86.4 fl (ref 78.0–100.0)
MONOS PCT: 7.8 % (ref 3.0–12.0)
Monocytes Absolute: 0.6 10*3/uL (ref 0.1–1.0)
NEUTROS ABS: 6 10*3/uL (ref 1.4–7.7)
NEUTROS PCT: 74.5 % (ref 43.0–77.0)
PLATELETS: 332 10*3/uL (ref 150.0–400.0)
RBC: 4.95 Mil/uL (ref 3.87–5.11)
RDW: 13.2 % (ref 11.5–15.5)
WBC: 8.1 10*3/uL (ref 4.0–10.5)

## 2015-09-17 LAB — LIPID PANEL
CHOLESTEROL: 264 mg/dL — AB (ref 0–200)
HDL: 60.5 mg/dL (ref >39.00)
LDL CALC: 175 mg/dL — AB (ref 0–99)
NonHDL: 203.16
TRIGLYCERIDES: 140 mg/dL (ref 0.0–149.0)
Total CHOL/HDL Ratio: 4
VLDL: 28 mg/dL (ref 0.0–40.0)

## 2015-09-17 LAB — COMPREHENSIVE METABOLIC PANEL
ALT: 18 U/L (ref 0–35)
AST: 24 U/L (ref 0–37)
Albumin: 4.4 g/dL (ref 3.5–5.2)
Alkaline Phosphatase: 106 U/L (ref 39–117)
BILIRUBIN TOTAL: 0.6 mg/dL (ref 0.2–1.2)
BUN: 20 mg/dL (ref 6–23)
CALCIUM: 10.3 mg/dL (ref 8.4–10.5)
CO2: 29 meq/L (ref 19–32)
Chloride: 102 mEq/L (ref 96–112)
Creatinine, Ser: 0.87 mg/dL (ref 0.40–1.20)
GFR: 70.52 mL/min (ref >60.00)
GLUCOSE: 91 mg/dL (ref 70–99)
POTASSIUM: 3.7 meq/L (ref 3.5–5.1)
Sodium: 141 mEq/L (ref 135–145)
Total Protein: 7.4 g/dL (ref 6.0–8.3)

## 2015-09-17 LAB — TSH: TSH: 4.47 u[IU]/mL (ref 0.35–4.50)

## 2015-09-17 LAB — HEMOGLOBIN A1C: Hgb A1c MFr Bld: 5.5 % (ref 4.6–6.5)

## 2015-09-17 NOTE — Assessment & Plan Note (Signed)
Controlled Continue current dose of fluoxetine

## 2015-09-17 NOTE — Assessment & Plan Note (Signed)
She probably has hypertension Blood pressure looks good today Continue hydrochlorothiazide 12.5 mg daily Check CMP

## 2015-09-17 NOTE — Progress Notes (Signed)
**Note Dana-Identified via Obfuscation** Subjective:    Patient ID: Dana Mccoy, female    DOB: 04-07-1955, 60 y.o.   MRN: [id number]  HPI She is here to establish with a new pcp.  She is here for a physical exam.   Night cough:  She uses a wedge at night and it helps.  She tried allergy medication, but it has not helped. She uses her Breo inhaler daily and only needs the albuterol as needed.  She takes the omeprazole daily and denies GERD.  She does have a history of silent GERD.  She only has occasionally SOB and wheeze.    She has had cataract surgery, but there have been no other changes in her history.  She has no other concerns.   Medications and allergies reviewed with patient and updated if appropriate.  Patient Active Problem List   Diagnosis Date Noted  . Anxiety state, unspecified 12/08/2013  . Obesity (BMI 30-39.9) 08/02/2013  . Rhinitis 08/02/2013  . Cervical stenosis of spine 04/26/2011  . ELEVATED BLOOD PRESSURE WITHOUT DIAGNOSIS OF HYPERTENSION 08/26/2008  . HYPERLIPIDEMIA 06/05/2007  . Reactive airway disease 06/05/2007  . GERD 06/05/2007    Current Outpatient Prescriptions on File Prior to Visit  Medication Sig Dispense Refill  . acyclovir (ZOVIRAX) 400 MG tablet Take 400 mg by mouth daily. Patient states she has a 500mg  tab (not an option in epic), filled by another MD    . Ascorbic Acid (VITAMIN C PO) Take 1 tablet by mouth daily. 1000mg     . Cetirizine HCl (ZYRTEC ALLERGY) 10 MG CAPS Take 10 mg by mouth.    . EPINEPHrine (EPIPEN 2-PAK) 0.3 mg/0.3 mL DEVI Inject 0.3 mg into the muscle once.    . fluticasone (FLONASE) 50 MCG/ACT nasal spray Place 2 sprays into both nostrils daily. 16 g 0  . fluticasone furoate-vilanterol (BREO ELLIPTA) 100-25 MCG/INH AEPB INHALE 1 PUFF INTO THE LUNGS DAILY. Must keep July appt for refills 60 each 0  . hydrochlorothiazide (MICROZIDE) 12.5 MG capsule Take 1 capsule (12.5 mg total) by mouth daily. 30 capsule 1  . Multiple Vitamin (MULTIVITAMIN) tablet Take 1  tablet by mouth daily.    Marland Kitchen omeprazole (PRILOSEC) 40 MG capsule Take 40 mg by mouth daily.    . VENTOLIN HFA 108 (90 BASE) MCG/ACT inhaler INHALE 2 PUFFS EVERY 4 TO 6 HOURS AS NEEDED FOR COUGH OR WHEEZE *MAY USE 10-20 MIN PRIOR TO EXERCISE 18 Inhaler 11  . [DISCONTINUED] clonazePAM (KLONOPIN) 0.5 MG tablet 1/2 q 12 hours as needed 30 tablet 1  . [DISCONTINUED] ferrous sulfate 325 (65 FE) MG tablet Take 325 mg by mouth as needed.     . [DISCONTINUED] Omeprazole-Sodium Bicarbonate (ZEGERID PO) Take 2 tablets by mouth daily.     No current facility-administered medications on file prior to visit.    Past Medical History  Diagnosis Date  . GERD (gastroesophageal reflux disease)   . Reactive airway disease     post CAP  . Hyperlipidemia   . Hypertension     Past Surgical History  Procedure Laterality Date  . Dilation and curettage of uterus    . Wisdom tooth extraction    . Colonoscopy  2007    Dr Cristina Gong  . Upper gi endoscopy      X 2  . Cardiac catheterization  1997    negative    Social History   Social History  . Marital Status: Divorced    Spouse Name: N/A  . Number of  Children: N/A  . Years of Education: N/A   Social History Main Topics  . Smoking status: Former Smoker    Quit date: 03/14/1984  . Smokeless tobacco: Never Used     Comment: smoked 1975-1986, up to 1.5 ppd  . Alcohol Use: No  . Drug Use: No  . Sexual Activity: Not Asked   Other Topics Concern  . None   Social History Narrative   Case manager - [employer]      Exercise: 2-4 times a week    Family History  Problem Relation Age of Onset  . Adopted: Yes    Review of Systems  Constitutional: Negative for fever, chills, appetite change, fatigue and unexpected weight change.  HENT: Negative for hearing loss.   Eyes: Negative for visual disturbance.  Respiratory: Positive for cough (night > day, mostly dry), shortness of breath (on occasion for no reason) and wheezing (occasional).   Cardiovascular:  Positive for chest pain (on occasion - chronic and cardiac w/u  in past was neg) and palpitations (occasional). Negative for leg swelling.  Gastrointestinal: Negative for nausea, abdominal pain, diarrhea, constipation and blood in stool.       No gerd - controlled with omeprazole  Genitourinary: Negative for dysuria and hematuria.  Musculoskeletal: Positive for arthralgias and neck pain (intermittent - stenosis - gets periodic injections). Negative for myalgias and back pain.  Skin: Positive for rash (dry on back - chronic). Negative for color change.  Neurological: Positive for dizziness (rare) and headaches (allergy related on occasion). Negative for light-headedness.  Psychiatric/Behavioral: Negative for dysphoric mood. The patient is nervous/anxious (controlled).        Objective:   Filed Vitals:   09/17/15 0813  BP: 132/86  Pulse: 84  Temp: 98.4 F (36.9 C)  Resp: 16   Filed Weights   09/17/15 0813  Weight: 189 lb (85.73 kg)   Body mass index is 31.45 kg/(m^2).   Physical Exam Constitutional: She appears well-developed and well-nourished. No distress.  HENT:  Head: Normocephalic and atraumatic.  Right Ear: External ear normal. Normal ear canal and TM Left Ear: External ear normal.  Normal ear canal and TM Mouth/Throat: Oropharynx is clear and moist.  Eyes: Conjunctivae and EOM are normal.  Neck: Neck supple. No tracheal deviation present. No thyromegaly present.  No carotid bruit  Cardiovascular: Normal rate, regular rhythm and normal heart sounds.   No murmur heard.  No edema. Pulmonary/Chest: Effort normal and breath sounds normal. No respiratory distress. She has no wheezes. She has no rales.  Breast: deferred to Gyn Abdominal: Soft. She exhibits no distension. There is no tenderness.  Lymphadenopathy: She has no cervical adenopathy.  Skin: Skin is warm and dry. She is not diaphoretic. slight dryness and normal skin colored bumps on upper back - no change for years  as far as she knows Psychiatric: She has a normal mood and affect. Her behavior is normal.         Assessment & Plan:   Physical exam: Screening blood work  ordered Immunizations  Discussed tdap (she feels she is up to date) and shingles vaccine Colonoscopy Up to date  Mammogram  Up to date  Gyn   Up to date  Dexa   last done 2012 - not concerned about OP - deferred repeat dexa Eye exams Up to date  EKG  2015 - normal Exercise -not always regular. Discussed the importance of regular exercise Weight-she plans on working on weight loss Skin mild rash on back,  otherwise normal. Can use cortisone cream as needed-not more than 2 weeks at a time  Substance abuse-no evidence of abuse   See Problem List for Assessment and Plan of chronic medical problems.  Follow-up annually

## 2015-09-17 NOTE — Assessment & Plan Note (Signed)
Did not tolerate Lipitor-cognitive side effects Check lipid panel Increase exercise, work on weight loss

## 2015-09-17 NOTE — Patient Instructions (Signed)
  Test(s) ordered today. Your results will be released to [facility] (or called to you) after review, usually within 72hours after test completion. If any changes need to be made, you will be notified at that same time.  All other Health Maintenance issues reviewed.   All recommended immunizations and age-appropriate screenings are up-to-date or discussed.  No immunizations administered today.   Medications reviewed and updated.  No changes recommended at this time.   Please followup in one year, sooner if needed

## 2015-09-17 NOTE — Assessment & Plan Note (Signed)
Dry cough for several months May be related to allergies, reactive airway disease or GERD Advised her to increase her heartburn medication-can try omeprazole twice daily for omeprazole once daily and Zantac at night. She does have a history of silent GERD in the past and most likely that is causing her symptom Continue Zyrtec daily, Flonase Continue Breo daily, albuterol as needed She has seen ENT in the past and her cough persists can re refer

## 2015-09-17 NOTE — Progress Notes (Signed)
Pre visit review using our clinic review tool, if applicable. No additional management support is needed unless otherwise documented below in the visit note. 

## 2015-09-19 ENCOUNTER — Other Ambulatory Visit: Payer: Self-pay | Admitting: Internal Medicine

## 2015-09-24 ENCOUNTER — Other Ambulatory Visit: Payer: Self-pay | Admitting: Internal Medicine

## 2016-02-12 ENCOUNTER — Other Ambulatory Visit: Payer: Self-pay | Admitting: Internal Medicine

## 2016-02-16 ENCOUNTER — Ambulatory Visit (INDEPENDENT_AMBULATORY_CARE_PROVIDER_SITE_OTHER): Payer: 59 | Admitting: Internal Medicine

## 2016-02-16 ENCOUNTER — Encounter: Payer: Self-pay | Admitting: Internal Medicine

## 2016-02-16 DIAGNOSIS — R05 Cough: Secondary | ICD-10-CM

## 2016-02-16 DIAGNOSIS — R059 Cough, unspecified: Secondary | ICD-10-CM

## 2016-02-16 MED ORDER — BENZONATATE 200 MG PO CAPS: 200.0000 mg | ORAL_CAPSULE | Freq: Three times a day (TID) | ORAL | 0 refills | Status: DC | PRN

## 2016-02-16 MED ORDER — PREDNISONE 20 MG PO TABS: 40.0000 mg | ORAL_TABLET | Freq: Every day | ORAL | 0 refills | Status: DC

## 2016-02-16 NOTE — Assessment & Plan Note (Signed)
Suspect some component of nasal drip but the wheezing is concerning with RAD. Rx for prednisone and tessalon perles and continue her breo and albuterol as needed.

## 2016-02-16 NOTE — Progress Notes (Signed)
   Subjective:    Patient ID: De Burrs, female    DOB: [birth year], 60 y.o.   MRN: [id number]  HPI The patient is a 60 YO female coming in for cough and congestion for about 10 days. No fevers or chills. She does have RAD and is taknig her breo daily, she is taking zyrtec and flonase daily right now. Has an albuterol inhaler and has not been using it more lately. Overall symptoms are worsening, some mild wheezing. Coughing fits which make it hard to breathe.   Review of Systems  Constitutional: Positive for activity change. Negative for appetite change, chills, fatigue and fever.  HENT: Positive for congestion, postnasal drip and rhinorrhea. Negative for ear discharge, ear pain, sinus pain, sinus pressure, sore throat and trouble swallowing.   Eyes: Negative.   Respiratory: Positive for cough and shortness of breath. Negative for chest tightness and wheezing.   Cardiovascular: Negative.   Gastrointestinal: Negative.       Objective:   Physical Exam  Constitutional: She appears well-developed and well-nourished.  HENT:  Head: Normocephalic and atraumatic.  Oropharynx with redness and clear drainage, nose without crusting.   Eyes: EOM are normal.  Neck: Normal range of motion.  Cardiovascular: Normal rate and regular rhythm.   Pulmonary/Chest: Effort normal. No respiratory distress. She has wheezes. She has no rales.  Scattered expiratory wheezing which partially clears with coughing.   Abdominal: Soft.  Lymphadenopathy:    She has no cervical adenopathy.  Skin: Skin is warm and dry.   Vitals:   02/16/16 1354  BP: 130/78  Pulse: 68  Resp: 18  Temp: 98.5 F (36.9 C)  TempSrc: Oral  SpO2: 98%  Weight: 196 lb (88.9 kg)  Height: 5\' 5"  (1.651 m)      Assessment & Plan:

## 2016-02-16 NOTE — Progress Notes (Signed)
Pre visit review using our clinic review tool, if applicable. No additional management support is needed unless otherwise documented below in the visit note. 

## 2016-02-16 NOTE — Patient Instructions (Signed)
We have sent in prednisone to help with the sinuses. Take 2 pills daily for the next 5 days then stop.   We have also sent in tessalon perles to use to help with cough as needed up to 3 times per day.

## 2016-02-17 ENCOUNTER — Ambulatory Visit: Payer: 59 | Admitting: Internal Medicine

## 2016-03-22 ENCOUNTER — Other Ambulatory Visit: Payer: Self-pay | Admitting: Internal Medicine

## 2016-04-26 ENCOUNTER — Encounter: Payer: Self-pay | Admitting: Family Medicine

## 2016-04-26 ENCOUNTER — Ambulatory Visit (INDEPENDENT_AMBULATORY_CARE_PROVIDER_SITE_OTHER): Payer: 59 | Admitting: Family Medicine

## 2016-04-26 VITALS — BP 122/78 | HR 67 | Temp 98.2°F | Wt 196.2 lb

## 2016-04-26 DIAGNOSIS — J4541 Moderate persistent asthma with (acute) exacerbation: Secondary | ICD-10-CM | POA: Diagnosis not present

## 2016-04-26 MED ORDER — HYDROCODONE-HOMATROPINE 5-1.5 MG/5ML PO SYRP: 5.0000 mL | ORAL_SOLUTION | Freq: Every evening | ORAL | 0 refills | Status: DC | PRN

## 2016-04-26 MED ORDER — IPRATROPIUM-ALBUTEROL 0.5-2.5 (3) MG/3ML IN SOLN
3.0000 mL | Freq: Four times a day (QID) | RESPIRATORY_TRACT | Status: DC
  Administered 2016-04-26: 3 mL via RESPIRATORY_TRACT

## 2016-04-26 MED ORDER — PREDNISONE 20 MG PO TABS: 40.0000 mg | ORAL_TABLET | Freq: Every day | ORAL | 0 refills | Status: DC

## 2016-04-26 MED ORDER — METHYLPREDNISOLONE ACETATE 80 MG/ML IJ SUSP
80.0000 mg | Freq: Once | INTRAMUSCULAR | Status: AC
  Administered 2016-04-26: 80 mg via INTRAMUSCULAR

## 2016-04-26 MED ORDER — AZITHROMYCIN 250 MG PO TABS: ORAL_TABLET | ORAL | 0 refills | Status: DC

## 2016-04-26 NOTE — Progress Notes (Signed)
Pre visit review using our clinic review tool, if applicable. No additional management support is needed unless otherwise documented below in the visit note. 

## 2016-04-26 NOTE — Progress Notes (Signed)
History of Present Illness:    Dana Mccoy is a 61 y.o. female here for evaluation of a cough. The cough is non-productive, with wheezing, with shortness of breath during the cough and is aggravated by reclining position. Onset of symptoms was 4 days ago, gradually worsening since that time.  Associated symptoms include postnasal drip. Patient does not have a history of asthma but has been diagnosed with RAD. Patient has not had recent travel. Patient does have a history of smoking.   PMHx, SurgHx, SocialHx, Medications, and Allergies were reviewed in the Visit Navigator and updated as appropriate.   Past Medical History:  Diagnosis Date  . GERD (gastroesophageal reflux disease)   . Hyperlipidemia   . Hypertension   . Reactive airway disease    post CAP   Past Surgical History:  Procedure Laterality Date  . CARDIAC CATHETERIZATION  1997   negative  . COLONOSCOPY  2007   Dr Cristina Gong  . DILATION AND CURETTAGE OF UTERUS    . UPPER GI ENDOSCOPY     X 2  . WISDOM TOOTH EXTRACTION        Family History  Problem Relation Age of Onset  . Adopted: Yes   Social History  Substance Use Topics  . Smoking status: Former Smoker    Quit date: 03/14/1984  . Smokeless tobacco: Never Used     Comment: smoked 1975-1986, up to 1.5 ppd  . Alcohol use No     Allergies  Allergen Reactions  . Penicillins     RASH  . Lipitor [Atorvastatin]     Cognitive impact    Prior to Admission medications   Medication Sig Start Date End Date Taking? Authorizing Provider  acyclovir (ZOVIRAX) 400 MG tablet Take 400 mg by mouth daily. Patient states she has a 500mg  tab (not an option in epic), filled by another MD   Yes Historical Provider, MD  Ascorbic Acid (VITAMIN C PO) Take 1 tablet by mouth daily. 1000mg    Yes Historical Provider, MD  benzonatate (TESSALON) 200 MG capsule Take 1 capsule (200 mg total) by mouth 3 (three) times daily as needed for cough. 02/16/16  Yes Hoyt Koch, MD    BREO ELLIPTA 100-25 MCG/INH AEPB INHALE 1 PUFF INTO THE LUNGS EVERY DAY 02/15/16  Yes Binnie Rail, MD  Cetirizine HCl (ZYRTEC ALLERGY) 10 MG CAPS Take 10 mg by mouth.   Yes Historical Provider, MD  EPINEPHrine (EPIPEN 2-PAK) 0.3 mg/0.3 mL DEVI Inject 0.3 mg into the muscle once.   Yes Historical Provider, MD  FLUoxetine (PROZAC) 20 MG tablet Take 20 mg by mouth daily.   Yes Historical Provider, MD  fluticasone (FLONASE) 50 MCG/ACT nasal spray Place 2 sprays into both nostrils daily. 07/11/13  Yes Lucretia Kern, DO  hydrochlorothiazide (MICROZIDE) 12.5 MG capsule TAKE 1 CAPSULE (12.5 MG TOTAL) BY MOUTH DAILY. 03/22/16  Yes Binnie Rail, MD  Multiple Vitamin (MULTIVITAMIN) tablet Take 1 tablet by mouth daily.   Yes Historical Provider, MD  omeprazole (PRILOSEC) 40 MG capsule Take 40 mg by mouth daily.   Yes Historical Provider, MD  VENTOLIN HFA 108 (90 BASE) MCG/ACT inhaler INHALE 2 PUFFS EVERY 4 TO 6 HOURS AS NEEDED FOR COUGH OR WHEEZE *MAY USE 10-20 MIN PRIOR TO EXERCISE 06/30/14  Yes Hendricks Limes, MD     Review of Systems:   No unusual headaches, no dizziness. No dyspnea or chest pain on exertion. No abdominal pain, change in bowel habits, black  or bloody stools.  No urinary tract symptoms. No new or unusual musculoskeletal symptoms. No edema.   Vitals:   04/26/16 1138  BP: 122/78  Pulse: 67  Temp: 98.2 F (36.8 C)  TempSrc: Oral  SpO2: 99%  Weight: 196 lb 3.2 oz (89 kg)     Body mass index is 32.65 kg/m.   Physical Exam:   General appearance: alert and cooperative HEENT: extra ocular movement intact and OP with erythema, PND Lungs: wheezes bilaterally Heart: regular rate and rhythm, S1, S2 normal, no murmur, click, rub or gallop Abdomen: soft, non-tender; bowel sounds normal; no masses,  no organomegaly Extremities: extremities normal, atraumatic, no cyanosis or edema Pulses: 2+ and symmetric Skin: Skin color, texture, turgor normal. No rashes or lesions Neurologic:  Grossly normal  Assessment and Plan:    [name] was seen today for sinus problem and cough.  Diagnoses and all orders for this visit:  Moderate persistent reactive airway disease with acute exacerbation -     methylPREDNISolone acetate (DEPO-MEDROL) injection 80 mg; Inject 1 mL (80 mg total) into the muscle once. -     ipratropium-albuterol (DUONEB) 0.5-2.5 (3) MG/3ML nebulizer solution 3 mL; Take 3 mLs by nebulization every 6 (six) hours. -     azithromycin (ZITHROMAX Z-PAK) 250 MG tablet; Take 2 tablets ( total of 500 mg) PO on day 1, then 1 tablet ( total of 250 mg) PO q24 x 4 days. -     predniSONE (DELTASONE) 20 MG tablet; Take 2 tablets (40 mg total) by mouth daily. -     HYDROcodone-homatropine (HYCODAN) 5-1.5 MG/5ML syrup; Take 5 mLs by mouth at bedtime as needed for cough.     . Reviewed expectations re: course of current medical issues. . Discussed self-management of symptoms. . Outlined signs and symptoms indicating need for more acute intervention. . Patient verbalized understanding and all questions were answered. . See orders for this visit as documented in the electronic medical record. . Patient received an After Visit Summary.   Briscoe Deutscher, D.O.

## 2016-05-10 ENCOUNTER — Other Ambulatory Visit: Payer: Self-pay | Admitting: Internal Medicine

## 2016-05-10 DIAGNOSIS — Z1231 Encounter for screening mammogram for malignant neoplasm of breast: Secondary | ICD-10-CM

## 2016-07-04 ENCOUNTER — Ambulatory Visit: Payer: 59

## 2016-07-05 ENCOUNTER — Ambulatory Visit
Admission: RE | Admit: 2016-07-05 | Discharge: 2016-07-05 | Disposition: A | Discharge status: Home / Self Care | Payer: 59 | Source: Ambulatory Visit | Attending: Internal Medicine | Admitting: Internal Medicine

## 2016-07-05 DIAGNOSIS — Z1231 Encounter for screening mammogram for malignant neoplasm of breast: Secondary | ICD-10-CM

## 2016-07-19 ENCOUNTER — Other Ambulatory Visit: Payer: Self-pay | Admitting: General Practice

## 2016-07-19 MED ORDER — ALBUTEROL SULFATE HFA 108 (90 BASE) MCG/ACT IN AERS: INHALATION_SPRAY | RESPIRATORY_TRACT | 3 refills | Status: DC

## 2016-07-21 ENCOUNTER — Ambulatory Visit (INDEPENDENT_AMBULATORY_CARE_PROVIDER_SITE_OTHER): Payer: 59 | Admitting: Internal Medicine

## 2016-07-21 ENCOUNTER — Encounter: Payer: Self-pay | Admitting: Internal Medicine

## 2016-07-21 ENCOUNTER — Ambulatory Visit (INDEPENDENT_AMBULATORY_CARE_PROVIDER_SITE_OTHER)
Admission: RE | Admit: 2016-07-21 | Discharge: 2016-07-21 | Disposition: A | Discharge status: Home / Self Care | Payer: 59 | Source: Ambulatory Visit | Attending: Internal Medicine | Admitting: Internal Medicine

## 2016-07-21 DIAGNOSIS — J45901 Unspecified asthma with (acute) exacerbation: Secondary | ICD-10-CM

## 2016-07-21 DIAGNOSIS — J209 Acute bronchitis, unspecified: Secondary | ICD-10-CM

## 2016-07-21 DIAGNOSIS — J40 Bronchitis, not specified as acute or chronic: Secondary | ICD-10-CM | POA: Insufficient documentation

## 2016-07-21 MED ORDER — IPRATROPIUM-ALBUTEROL 0.5-2.5 (3) MG/3ML IN SOLN
3.0000 mL | Freq: Once | RESPIRATORY_TRACT | Status: AC
  Administered 2016-07-21: 3 mL via RESPIRATORY_TRACT

## 2016-07-21 MED ORDER — PREDNISONE 10 MG PO TABS: ORAL_TABLET | ORAL | 0 refills | Status: DC

## 2016-07-21 MED ORDER — LEVOFLOXACIN 500 MG PO TABS: 500.0000 mg | ORAL_TABLET | Freq: Every day | ORAL | 0 refills | Status: DC

## 2016-07-21 MED ORDER — METHYLPREDNISOLONE ACETATE 80 MG/ML IJ SUSP
80.0000 mg | Freq: Once | INTRAMUSCULAR | Status: AC
  Administered 2016-07-21: 80 mg via INTRAMUSCULAR

## 2016-07-21 NOTE — Patient Instructions (Addendum)
Have a chest xray done today.  We will call you with the results.   You received a neb treatment and steroid injection today.    Medications reviewed and updated.  Stop the zpak.  Start levaquin.  Start the prednisone taper.   Your prescription(s) have been submitted to your pharmacy. Please take as directed and contact our office if you believe you are having problem(s) with the medication(s).   Please followup if there is no improvement

## 2016-07-21 NOTE — Progress Notes (Signed)
Subjective:    Patient ID: Dana Mccoy, female    DOB: 03/20/1955, 61 y.o.   MRN: [id number]  HPI She is here for An acute visit for cold symptoms.  Over the past 6 months she is being several times for colds/bronchitis.  02/2016: cough, congestion x 10 days.  Prednisone, tessalon perles, continue breo, Albuterol  04/26/16: dry cough, wheeze, sob. 4 days of symptoms.  Medrol 80 IM, duobeb, zpak, prednisone 40 mg qd, hycodan  5/6: acute bronchitits:  Prednisone 40 mg x 5 days, zpak  She feels her symptoms started last fall.  Her asthma has seemed to be worse since then. She has a getting a cold and turn into bronchitis. In between these episodes of being sick she feels normal and feels the asthma/reactive airway disease is well controlled. She started to get sick last week and went to urgent care on Sunday, 4 days ago. She was diagnosed with bronchitis and prescribed prednisone 40 mg daily for 5 days and a Z-Pak. She does feel slightly better, but not as good as she thinks she should. She has also been taking tessalon perles and hydrocodan prn.  She uses albuterol q 6 hrs.  She uses breo daily.    She has a swollen gland in her neck.  She states chills, nasal congestion, mild sinus pressure, sore throat, tightness in her chest, cough, shortness breath, wheezing, body aches and lightheadedness. She has not had a fever recently. She denies any gastrointestinal symptoms and denies headaches.  Medications and allergies reviewed with patient and updated if appropriate.  Patient Active Problem List   Diagnosis Date Noted  . Cough 09/17/2015  . Anxiety 12/08/2013  . Obesity (BMI 30-39.9) 08/02/2013  . Rhinitis 08/02/2013  . Cervical stenosis of spine 04/26/2011  . ELEVATED BLOOD PRESSURE WITHOUT DIAGNOSIS OF HYPERTENSION 08/26/2008  . Hyperlipidemia 06/05/2007  . Reactive airway disease 06/05/2007  . GERD 06/05/2007    Current Outpatient Prescriptions on File Prior to Visit    Medication Sig Dispense Refill  . acyclovir (ZOVIRAX) 400 MG tablet Take 400 mg by mouth daily. Patient states she has a 500mg  tab (not an option in epic), filled by another MD    . albuterol (VENTOLIN HFA) 108 (90 Base) MCG/ACT inhaler INHALE 2 PUFFS EVERY 4 TO 6 HOURS AS NEEDED FOR COUGH OR WHEEZE *MAY USE 10-20 MIN PRIOR TO EXERCISE 18 g 3  . Ascorbic Acid (VITAMIN C PO) Take 1 tablet by mouth daily. 1000mg     . azithromycin (ZITHROMAX Z-PAK) 250 MG tablet Take 2 tablets ( total of 500 mg) PO on day 1, then 1 tablet ( total of 250 mg) PO q24 x 4 days. 6 each 0  . benzonatate (TESSALON) 200 MG capsule Take 1 capsule (200 mg total) by mouth 3 (three) times daily as needed for cough. 60 capsule 0  . BREO ELLIPTA 100-25 MCG/INH AEPB INHALE 1 PUFF INTO THE LUNGS EVERY DAY 60 each 5  . Cetirizine HCl (ZYRTEC ALLERGY) 10 MG CAPS Take 10 mg by mouth.    . EPINEPHrine (EPIPEN 2-PAK) 0.3 mg/0.3 mL DEVI Inject 0.3 mg into the muscle once.    Marland Kitchen FLUoxetine (PROZAC) 20 MG tablet Take 20 mg by mouth daily.    . fluticasone (FLONASE) 50 MCG/ACT nasal spray Place 2 sprays into both nostrils daily. 16 g 0  . hydrochlorothiazide (MICROZIDE) 12.5 MG capsule TAKE 1 CAPSULE (12.5 MG TOTAL) BY MOUTH DAILY. 30 capsule 5  . HYDROcodone-homatropine (HYCODAN)  5-1.5 MG/5ML syrup Take 5 mLs by mouth at bedtime as needed for cough. 120 mL 0  . Multiple Vitamin (MULTIVITAMIN) tablet Take 1 tablet by mouth daily.    Marland Kitchen omeprazole (PRILOSEC) 40 MG capsule Take 40 mg by mouth daily.    . predniSONE (DELTASONE) 20 MG tablet Take 2 tablets (40 mg total) by mouth daily. 10 tablet 0  . [DISCONTINUED] clonazePAM (KLONOPIN) 0.5 MG tablet 1/2 q 12 hours as needed 30 tablet 1  . [DISCONTINUED] ferrous sulfate 325 (65 FE) MG tablet Take 325 mg by mouth as needed.     . [DISCONTINUED] Omeprazole-Sodium Bicarbonate (ZEGERID PO) Take 2 tablets by mouth daily.     Current Facility-Administered Medications on File Prior to Visit   Medication Dose Route Frequency Provider Last Rate Last Dose  . ipratropium-albuterol (DUONEB) 0.5-2.5 (3) MG/3ML nebulizer solution 3 mL  3 mL Nebulization Q6H Briscoe Deutscher, DO   3 mL at 04/26/16 1217    Past Medical History:  Diagnosis Date  . GERD (gastroesophageal reflux disease)   . Hyperlipidemia   . Hypertension   . Reactive airway disease    post CAP    Past Surgical History:  Procedure Laterality Date  . CARDIAC CATHETERIZATION  1997   negative  . COLONOSCOPY  2007   Dr Cristina Gong  . DILATION AND CURETTAGE OF UTERUS    . UPPER GI ENDOSCOPY     X 2  . WISDOM TOOTH EXTRACTION      Social History   Social History  . Marital status: Divorced    Spouse name: N/A  . Number of children: N/A  . Years of education: N/A   Social History Main Topics  . Smoking status: Former Smoker    Quit date: 03/14/1984  . Smokeless tobacco: Never Used     Comment: smoked 1975-1986, up to 1.5 ppd  . Alcohol use No  . Drug use: No  . Sexual activity: Not on file   Other Topics Concern  . Not on file   Social History Narrative   Case manager - [employer]      Exercise: 2-4 times a week    Family History  Problem Relation Age of Onset  . Adopted: Yes    Review of Systems  Constitutional: Positive for chills. Negative for fever.  HENT: Positive for congestion, sinus pressure (mild) and sore throat (yesterday and today). Negative for ear pain and sinus pain.   Respiratory: Positive for cough (mostly dry, sounds wet), chest tightness, shortness of breath and wheezing.   Gastrointestinal: Negative for abdominal pain, diarrhea and nausea.  Musculoskeletal: Positive for myalgias (last week).  Neurological: Positive for light-headedness. Negative for dizziness and headaches.       Objective:   Vitals:   07/21/16 1018  BP: 128/78  Pulse: 62  Resp: 16  Temp: 98.1 F (36.7 C)   Filed Weights   07/21/16 1018  Weight: 197 lb (89.4 kg)   Body mass index is 32.78 kg/m.  Wt  Readings from Last 3 Encounters:  07/21/16 197 lb (89.4 kg)  04/26/16 196 lb 3.2 oz (89 kg)  02/16/16 196 lb (88.9 kg)     Physical Exam GENERAL APPEARANCE: Appears stated age, well appearing, NAD EYES: conjunctiva clear, no icterus HEENT: bilateral tympanic membranes and ear canals normal, oropharynx with no erythema, no thyromegaly, trachea midline, no cervical or supraclavicular lymphadenopathy LUNGS: Unlabored breathing, good air entry bilaterally, diffuse expiratory wheeze, no crackles HEART: Normal S1,S2 without murmurs EXTREMITIES: Without  clubbing, cyanosis, or edema        Assessment & Plan:   See Problem List for Assessment and Plan of chronic medical problems.

## 2016-07-21 NOTE — Assessment & Plan Note (Addendum)
Persistent asthma exacerbation/reactive airway disease despite oral steroids and Z-Pak and I do not think which she is on is effective Depo-medrol 80 mg IM x 1 Prednisone taper Stop zpak, start levaquin x 5 days Duo neb Call or return if no improvement or with any questions

## 2016-07-25 ENCOUNTER — Telehealth: Payer: Self-pay | Admitting: Internal Medicine

## 2016-07-25 DIAGNOSIS — J45909 Unspecified asthma, uncomplicated: Secondary | ICD-10-CM

## 2016-07-25 DIAGNOSIS — R0602 Shortness of breath: Secondary | ICD-10-CM

## 2016-07-25 NOTE — Telephone Encounter (Signed)
I think she should see pulmonary - let me know if she agrees and I will refer.

## 2016-07-25 NOTE — Telephone Encounter (Signed)
Pt called stating she is feeling better, but is still having shortness of breath.  Would like to know what she can do, she has not returned to work yet. Should she come in and be seen?

## 2016-07-26 NOTE — Telephone Encounter (Signed)
Referral ordered.   Ok to give note - whatever days she needs

## 2016-07-26 NOTE — Telephone Encounter (Signed)
Spoke with pt, she would like to have the referral entered for Pulmonary. She has not gone back to work yet and would like to know if we could send another work note.  She will call back with Fax number and more information as to whether we fill out the FMLA or just Short Term Disability.

## 2016-07-26 NOTE — Telephone Encounter (Signed)
Referral cancelled. 

## 2016-07-26 NOTE — Telephone Encounter (Signed)
Patient called back in stating she does not want Pulmonary Referral now.  Also states she planes to go back to work on Thursday.  States at this time she does not need a note to return back to work.  Does state she needs short term disability forms completed.  States will drop forms off tomorrow to be completed.

## 2016-07-27 NOTE — Telephone Encounter (Signed)
Pt came by office today & stated she works for [employer] and has [employer] and knows she can schedule a specialist appt without a referral.  I saw where the referral had been canceled.  I spoke with Dana Mccoy & she will re-open referral so pt can make her own appt.  Pt was going upstairs to schedule a pulmonary visit.  Pt also wanted to convey she will NOT be returning to work tomorrow as she is not feeling up to it.

## 2016-07-28 ENCOUNTER — Ambulatory Visit (INDEPENDENT_AMBULATORY_CARE_PROVIDER_SITE_OTHER): Payer: 59 | Admitting: Pulmonary Disease

## 2016-07-28 ENCOUNTER — Encounter: Payer: Self-pay | Admitting: Pulmonary Disease

## 2016-07-28 DIAGNOSIS — R05 Cough: Secondary | ICD-10-CM | POA: Diagnosis not present

## 2016-07-28 DIAGNOSIS — R059 Cough, unspecified: Secondary | ICD-10-CM

## 2016-07-28 DIAGNOSIS — J40 Bronchitis, not specified as acute or chronic: Secondary | ICD-10-CM | POA: Diagnosis not present

## 2016-07-28 DIAGNOSIS — K219 Gastro-esophageal reflux disease without esophagitis: Secondary | ICD-10-CM

## 2016-07-28 NOTE — Assessment & Plan Note (Signed)
Patient was diagnosed with asthma in her 36s and the asthma has been stable with Breo.  Asthma flared up with recent bronchitis when she went to [place] as her father was sick. She finished Z-Pak and is finishing off prednisone. She continues to cough and is hoarse. Her reflux is worse with prednisone.  I think she initially had viral bronchitis which later on cough is related to reflux disease. There is a component of upper airway cough syndrome with some nasal congestion and postnasal drip.  Chest x-ray done on May 10 revealed a bronchitis.  Plan : 1. Cont Breo 100 mcg/dose, 1 puff daily. If asthma starts getting worse or if she starts having wheezing, will change dose to 200 g per puff. 2. Continue albuterol 2 puffs every 4 hours as needed. 3. Taper off prednisone quickly. She has been on it 2 weeks and prednisone is making her reflux worse. 4. Rx UACS. Start Flonase, 2 squirts per nostril at bedtime. Continue Zyrtec daily. 5. Rx GERD. Try Nexium at bedtime. Try Zantac twice a day. Wean off Zantac once the cough is better. 6.  If not better in 1-2 weeks, told her to call. At that point, she will most likely need a chest CT scan to figure out the for missing a pneumonia.

## 2016-07-28 NOTE — Assessment & Plan Note (Signed)
Worse since being on prednisone recently. Try  taking Nexium at bedtime. Try Zantac twice a day.

## 2016-07-28 NOTE — Assessment & Plan Note (Signed)
Start flonase. Cont zyrtec. Rx asthma and GERD

## 2016-07-28 NOTE — Patient Instructions (Signed)
It was a pleasure taking care of you today!  You are diagnosed with bronchitis. Continue with Breo daily, albuterol MDI 2 puffs every 4 hrs as needed.  Start Fluticasone 2 squirts/nostril at bedtime. Continue Zyrtec.  Taper off prednisone.  Try Nexium at bedtime. Try Zantac 150 mg 2x/day.     Please call the office if you are having adverse reaction to meds/antibiotics.  Please call the office your symptoms are getting worse despite the meds/antibiotics.   Return to clinic in 6 weeks with APP.

## 2016-07-28 NOTE — Progress Notes (Signed)
Subjective:    Patient ID: Dana Mccoy, female    DOB: Mar 24, 1955, 61 y.o.   MRN: [id number]  HPI    This is the case of Dana Mccoy, 61 y.o. Female, who was referred by Dr. Billey Gosling  in consultation regarding cough and SOB  As you very well know, patient 20 PY smoking history, quit in 1987. Patient was diagnosed with asthma in her 25s.  She was seeing lung MD. She has always been on meds for asthma.  Triggers : change in weather, pollen, dust, strong odor.   Pt is currently on Breo x 2 yrs. She was on Symbicort prior.  She is not on O2.  She has GERD and takes Nexium in am.  She had sinus issues but better. Not on nasal spray.  Takes zyrtec daily. Not on singulair. Takes OTC decongestant.   Sx started 2-3 weeks ago.  She went to [place] as her dad was sick and admitted. She started getting sick with cough, congestion, hoarseness. She flew back to [facility] and was started on antibiotics (zpak) and prednisone taper. She had a chest x-ray which was unremarkable. She went to urgent care and was diagnosed with bronchitis. Her symptoms are persistent. Initially, she was better on prednisone taper but the last several days, she has persistent cough. Her reflux has also been worse since being on prednisone.  Review of Systems  Constitutional: Negative.  Negative for fever and unexpected weight change.  HENT: Positive for congestion. Negative for dental problem, ear pain, nosebleeds, postnasal drip, rhinorrhea, sinus pressure, sneezing, sore throat and trouble swallowing.   Eyes: Negative.  Negative for redness and itching.  Respiratory: Positive for cough, chest tightness and wheezing. Negative for shortness of breath.   Cardiovascular: Negative.  Negative for palpitations and leg swelling.  Gastrointestinal: Negative.  Negative for nausea and vomiting.  Endocrine: Negative.   Genitourinary: Negative.  Negative for dysuria.  Musculoskeletal: Negative.  Negative for joint  swelling.  Skin: Negative.  Negative for rash.  Allergic/Immunologic: Negative.  Negative for environmental allergies, food allergies and immunocompromised state.  Neurological: Positive for headaches.  Hematological: Negative.  Does not bruise/bleed easily.  Psychiatric/Behavioral: Negative.  Negative for dysphoric mood. The patient is not nervous/anxious.     Past Medical History:  Diagnosis Date  . GERD (gastroesophageal reflux disease)   . Hyperlipidemia   . Hypertension   . Reactive airway disease    post CAP   (-) CA, DVT  Family History  Problem Relation Age of Onset  . Adopted: Yes     Past Surgical History:  Procedure Laterality Date  . CARDIAC CATHETERIZATION  1997   negative  . COLONOSCOPY  2007   Dr Cristina Gong  . DILATION AND CURETTAGE OF UTERUS    . UPPER GI ENDOSCOPY     X 2  . WISDOM TOOTH EXTRACTION      Social History   Social History  . Marital status: Divorced    Spouse name: N/A  . Number of children: N/A  . Years of education: N/A   Occupational History  . Not on file.   Social History Main Topics  . Smoking status: Former Smoker    Quit date: 03/14/1984  . Smokeless tobacco: Never Used     Comment: smoked 1975-1986, up to 1.5 ppd  . Alcohol use No  . Drug use: No  . Sexual activity: Not on file   Other Topics Concern  . Not on file  Social History Narrative   Case manager - [employer]      Exercise: 2-4 times a week   Son lives with her.  Lives in [place].   Allergies  Allergen Reactions  . Penicillins     RASH  . Lipitor [Atorvastatin]     Cognitive impact     Outpatient Medications Prior to Visit  Medication Sig Dispense Refill  . acyclovir (ZOVIRAX) 400 MG tablet Take 400 mg by mouth daily. Patient states she has a 500mg  tab (not an option in epic), filled by another MD    . albuterol (VENTOLIN HFA) 108 (90 Base) MCG/ACT inhaler INHALE 2 PUFFS EVERY 4 TO 6 HOURS AS NEEDED FOR COUGH OR WHEEZE *MAY USE 10-20 MIN PRIOR TO EXERCISE 18 g  3  . Ascorbic Acid (VITAMIN C PO) Take 1 tablet by mouth daily. 1000mg     . BREO ELLIPTA 100-25 MCG/INH AEPB INHALE 1 PUFF INTO THE LUNGS EVERY DAY 60 each 5  . Cetirizine HCl (ZYRTEC ALLERGY) 10 MG CAPS Take 10 mg by mouth.    Marland Kitchen FLUoxetine (PROZAC) 20 MG tablet Take 20 mg by mouth daily.    . hydrochlorothiazide (MICROZIDE) 12.5 MG capsule TAKE 1 CAPSULE (12.5 MG TOTAL) BY MOUTH DAILY. 30 capsule 5  . HYDROcodone-homatropine (HYCODAN) 5-1.5 MG/5ML syrup Take 5 mLs by mouth at bedtime as needed for cough. 120 mL 0  . Multiple Vitamin (MULTIVITAMIN) tablet Take 1 tablet by mouth daily.    Marland Kitchen omeprazole (PRILOSEC) 40 MG capsule Take 40 mg by mouth daily.    . predniSONE (DELTASONE) 10 MG tablet Take 4 tabs po qd x 2 days, then 3 tabs po qd x 2 days, then 2 tabs po qd x 2 days, then 1 tab po qd x 2 days 20 tablet 0  . benzonatate (TESSALON) 200 MG capsule Take 1 capsule (200 mg total) by mouth 3 (three) times daily as needed for cough. (Patient not taking: Reported on 07/28/2016) 60 capsule 0  . EPINEPHrine (EPIPEN 2-PAK) 0.3 mg/0.3 mL DEVI Inject 0.3 mg into the muscle once.    . fluticasone (FLONASE) 50 MCG/ACT nasal spray Place 2 sprays into both nostrils daily. (Patient not taking: Reported on 07/28/2016) 16 g 0  . levofloxacin (LEVAQUIN) 500 MG tablet Take 1 tablet (500 mg total) by mouth daily. (Patient not taking: Reported on 07/28/2016) 5 tablet 0   No facility-administered medications prior to visit.    No orders of the defined types were placed in this encounter.        Objective:   Physical Exam   Vitals:  Vitals:   07/28/16 1439  BP: 118/64  Pulse: 90  SpO2: 95%  Weight: 194 lb 6.4 oz (88.2 kg)  Height: 5\' 5"  (1.651 m)    Constitutional/General:  Pleasant, well-nourished, well-developed, not in any distress,  Comfortably seating.  Well kempt  Body mass index is 32.35 kg/m. Wt Readings from Last 3 Encounters:  07/28/16 194 lb 6.4 oz (88.2 kg)  07/21/16 197 lb (89.4 kg)   04/26/16 196 lb 3.2 oz (89 kg)    HEENT: Pupils equal and reactive to light and accommodation. Anicteric sclerae. Normal nasal mucosa.   No oral  lesions,  mouth clear,  oropharynx clear, no postnasal drip. (-) Oral thrush. No dental caries.  Airway - Mallampati class III  Neck: No masses. Midline trachea. No JVD, (-) LAD. (-) bruits appreciated.  Respiratory/Chest: Grossly normal chest. (-) deformity. (-) Accessory muscle use.  Symmetric expansion. (-)  Tenderness on palpation.  Resonant on percussion.  Diminished BS on both lower lung zones. (-) wheezing, crackles, rhonchi (-) egophony  Cardiovascular: Regular rate and  rhythm, heart sounds normal, no murmur or gallops, no peripheral edema  Gastrointestinal:  Normal bowel sounds. Soft, non-tender. No hepatosplenomegaly.  (-) masses.   Musculoskeletal:  Normal muscle tone. Normal gait.   Extremities: Grossly normal. (-) clubbing, cyanosis.  (-) edema  Skin: (-) rash,lesions seen.   Neurological/Psychiatric : alert, oriented to time, place, person. Normal mood and affect         Assessment & Plan:  Bronchitis Patient was diagnosed with asthma in her 71s and the asthma has been stable with Breo.  Asthma flared up with recent bronchitis when she went to [place] as her father was sick. She finished Z-Pak and is finishing off prednisone. She continues to cough and is hoarse. Her reflux is worse with prednisone.  I think she initially had viral bronchitis which later on cough is related to reflux disease. There is a component of upper airway cough syndrome with some nasal congestion and postnasal drip.  Chest x-ray done on May 10 revealed a bronchitis.  Plan : 1. Cont Breo 100 mcg/dose, 1 puff daily. If asthma starts getting worse or if she starts having wheezing, will change dose to 200 g per puff. 2. Continue albuterol 2 puffs every 4 hours as needed. 3. Taper off prednisone quickly. She has been on it 2 weeks and  prednisone is making her reflux worse. 4. Rx UACS. Start Flonase, 2 squirts per nostril at bedtime. Continue Zyrtec daily. 5. Rx GERD. Try Nexium at bedtime. Try Zantac twice a day. Wean off Zantac once the cough is better. 6.  If not better in 1-2 weeks, told her to call. At that point, she will most likely need a chest CT scan to figure out the for missing a pneumonia.  GERD Worse since being on prednisone recently. Try  taking Nexium at bedtime. Try Zantac twice a day.  Cough Start flonase. Cont zyrtec. Rx asthma and GERD    Thank you very much for letting me participate in this patient's care. Please do not hesitate to give me a call if you have any questions or concerns regarding the treatment plan.   Patient will follow up with [name] in 6 weeks.     Monica Becton, MD 07/28/2016   3:29 PM [facility] Pager: [phone] Office: [phone], Fax: [phone]

## 2016-08-01 ENCOUNTER — Telehealth: Payer: Self-pay | Admitting: Internal Medicine

## 2016-08-01 NOTE — Telephone Encounter (Signed)
Pt has been out of work since 07/18/16. I have the disability papers to fill out. Pt is still coughing and dry hacking cough. Please advise what needs to be done. If anything is to be sent in please send to [facility]

## 2016-08-01 NOTE — Telephone Encounter (Signed)
Patient states she is still not back at work.  States she is some better, but still having coughing, wheezing and shortness of breath.  Would like to know if Dr. Quay Burow has any suggestions and would like to know if documentation is being completed for her employer.

## 2016-08-01 NOTE — Telephone Encounter (Signed)
She just saw pulmonary so whatever they advised.

## 2016-08-02 ENCOUNTER — Other Ambulatory Visit: Payer: Self-pay | Admitting: Internal Medicine

## 2016-08-02 MED ORDER — HYDROCOD POLST-CPM POLST ER 10-8 MG/5ML PO SUER: 5.0000 mL | Freq: Two times a day (BID) | ORAL | 0 refills | Status: DC | PRN

## 2016-08-02 NOTE — Telephone Encounter (Signed)
Spoke with pt to inform, she would like a cough medicine to take at night.

## 2016-08-02 NOTE — Telephone Encounter (Signed)
RX placed upfront for pick up. Pt is aware.

## 2016-08-02 NOTE — Telephone Encounter (Signed)
rx printed

## 2016-08-03 ENCOUNTER — Ambulatory Visit: Payer: 59 | Admitting: Pulmonary Disease

## 2016-09-08 ENCOUNTER — Ambulatory Visit: Payer: 59 | Admitting: Acute Care

## 2016-09-25 ENCOUNTER — Other Ambulatory Visit: Payer: Self-pay | Admitting: Internal Medicine

## 2016-10-05 ENCOUNTER — Other Ambulatory Visit: Payer: Self-pay | Admitting: Internal Medicine

## 2016-10-05 ENCOUNTER — Ambulatory Visit: Payer: 59 | Admitting: Adult Health

## 2016-10-12 ENCOUNTER — Encounter: Payer: 59 | Admitting: Internal Medicine

## 2016-10-14 ENCOUNTER — Encounter: Payer: 59 | Admitting: Internal Medicine

## 2016-10-30 ENCOUNTER — Other Ambulatory Visit: Payer: Self-pay | Admitting: Internal Medicine

## 2016-11-05 ENCOUNTER — Other Ambulatory Visit: Payer: Self-pay | Admitting: Internal Medicine

## 2016-11-16 ENCOUNTER — Other Ambulatory Visit (INDEPENDENT_AMBULATORY_CARE_PROVIDER_SITE_OTHER): Payer: 59

## 2016-11-16 ENCOUNTER — Encounter: Payer: Self-pay | Admitting: Internal Medicine

## 2016-11-16 ENCOUNTER — Ambulatory Visit (INDEPENDENT_AMBULATORY_CARE_PROVIDER_SITE_OTHER): Payer: 59 | Admitting: Internal Medicine

## 2016-11-16 VITALS — BP 108/68 | HR 67 | Temp 97.8°F | Resp 16 | Ht 65.0 in | Wt 206.0 lb

## 2016-11-16 DIAGNOSIS — K219 Gastro-esophageal reflux disease without esophagitis: Secondary | ICD-10-CM

## 2016-11-16 DIAGNOSIS — F419 Anxiety disorder, unspecified: Secondary | ICD-10-CM

## 2016-11-16 DIAGNOSIS — E7800 Pure hypercholesterolemia, unspecified: Secondary | ICD-10-CM

## 2016-11-16 DIAGNOSIS — Z23 Encounter for immunization: Secondary | ICD-10-CM

## 2016-11-16 DIAGNOSIS — Z0000 Encounter for general adult medical examination without abnormal findings: Secondary | ICD-10-CM

## 2016-11-16 LAB — COMPREHENSIVE METABOLIC PANEL
ALT: 28 U/L (ref 0–35)
AST: 34 U/L (ref 0–37)
Albumin: 4.2 g/dL (ref 3.5–5.2)
Alkaline Phosphatase: 92 U/L (ref 39–117)
BUN: 19 mg/dL (ref 6–23)
CHLORIDE: 102 meq/L (ref 96–112)
CO2: 30 meq/L (ref 19–32)
Calcium: 9.6 mg/dL (ref 8.4–10.5)
Creatinine, Ser: 0.85 mg/dL (ref 0.40–1.20)
GFR: 72.15 mL/min (ref >60.00)
Glucose, Bld: 98 mg/dL (ref 70–99)
POTASSIUM: 3.5 meq/L (ref 3.5–5.1)
SODIUM: 140 meq/L (ref 135–145)
Total Bilirubin: 0.4 mg/dL (ref 0.2–1.2)
Total Protein: 6.8 g/dL (ref 6.0–8.3)

## 2016-11-16 LAB — CBC WITH DIFFERENTIAL/PLATELET
BASOS PCT: 0.6 % (ref 0.0–3.0)
Basophils Absolute: 0 10*3/uL (ref 0.0–0.1)
EOS PCT: 2.3 % (ref 0.0–5.0)
Eosinophils Absolute: 0.2 10*3/uL (ref 0.0–0.7)
HCT: 41.7 % (ref 36.0–46.0)
Hemoglobin: 13.6 g/dL (ref 12.0–15.0)
Lymphocytes Relative: 22.7 % (ref 12.0–46.0)
Lymphs Abs: 1.7 10*3/uL (ref 0.7–4.0)
MCHC: 32.6 g/dL (ref 30.0–36.0)
MCV: 89 fl (ref 78.0–100.0)
MONO ABS: 0.7 10*3/uL (ref 0.1–1.0)
Monocytes Relative: 9.6 % (ref 3.0–12.0)
NEUTROS PCT: 64.8 % (ref 43.0–77.0)
Neutro Abs: 4.9 10*3/uL (ref 1.4–7.7)
Platelets: 314 10*3/uL (ref 150.0–400.0)
RBC: 4.69 Mil/uL (ref 3.87–5.11)
RDW: 13.5 % (ref 11.5–15.5)
WBC: 7.6 10*3/uL (ref 4.0–10.5)

## 2016-11-16 LAB — LIPID PANEL
CHOL/HDL RATIO: 5
Cholesterol: 240 mg/dL — ABNORMAL HIGH (ref 0–200)
HDL: 51 mg/dL (ref >39.00)
NonHDL: 189.42
Triglycerides: 328 mg/dL — ABNORMAL HIGH (ref 0.0–149.0)
VLDL: 65.6 mg/dL — AB (ref 0.0–40.0)

## 2016-11-16 LAB — TSH: TSH: 2.11 u[IU]/mL (ref 0.35–4.50)

## 2016-11-16 LAB — LDL CHOLESTEROL, DIRECT: LDL DIRECT: 137 mg/dL

## 2016-11-16 NOTE — Progress Notes (Signed)
Subjective:    Patient ID: Dana Mccoy, female    DOB: 1955/06/16, 61 y.o.   MRN: [id number]  HPI She is here for a physical exam.   She denies any changes in her history, except her father did pass away couple of weeks ago. She has no concerns.  She plans on moving to [place] sometime in November.  Medications and allergies reviewed with patient and updated if appropriate.  Patient Active Problem List   Diagnosis Date Noted  . Bronchitis 07/21/2016  . Cough 09/17/2015  . Anxiety 12/08/2013  . Obesity (BMI 30-39.9) 08/02/2013  . Rhinitis 08/02/2013  . Cervical stenosis of spine 04/26/2011  . Hyperlipidemia 06/05/2007  . Reactive airway disease 06/05/2007  . GERD 06/05/2007    Current Outpatient Prescriptions on File Prior to Visit  Medication Sig Dispense Refill  . acyclovir (ZOVIRAX) 400 MG tablet Take 400 mg by mouth daily. Patient states she has a 500mg  tab (not an option in epic), filled by another MD    . albuterol (VENTOLIN HFA) 108 (90 Base) MCG/ACT inhaler INHALE 2 PUFFS EVERY 4 TO 6 HOURS AS NEEDED FOR COUGH OR WHEEZE *MAY USE 10-20 MIN PRIOR TO EXERCISE 18 g 3  . Ascorbic Acid (VITAMIN C PO) Take 1 tablet by mouth daily. 1000mg     . Cetirizine HCl (ZYRTEC ALLERGY) 10 MG CAPS Take 10 mg by mouth.    . EPINEPHrine (EPIPEN 2-PAK) 0.3 mg/0.3 mL DEVI Inject 0.3 mg into the muscle once.    Marland Kitchen FLUoxetine (PROZAC) 20 MG tablet Take 20 mg by mouth daily.    . fluticasone furoate-vilanterol (BREO ELLIPTA) 100-25 MCG/INH AEPB Inhale 1 puff into the lungs daily. 60 each 0  . hydrochlorothiazide (MICROZIDE) 12.5 MG capsule TAKE 1 CAPSULE (12.5 MG TOTAL) BY MOUTH DAILY. 30 capsule 0  . Multiple Vitamin (MULTIVITAMIN) tablet Take 1 tablet by mouth daily.    Marland Kitchen omeprazole (PRILOSEC) 40 MG capsule Take 40 mg by mouth daily.    . [DISCONTINUED] clonazePAM (KLONOPIN) 0.5 MG tablet 1/2 q 12 hours as needed 30 tablet 1  . [DISCONTINUED] ferrous sulfate 325 (65 FE) MG tablet Take 325 mg  by mouth as needed.     . [DISCONTINUED] Omeprazole-Sodium Bicarbonate (ZEGERID PO) Take 2 tablets by mouth daily.     No current facility-administered medications on file prior to visit.     Past Medical History:  Diagnosis Date  . GERD (gastroesophageal reflux disease)   . Hyperlipidemia   . Hypertension   . Reactive airway disease    post CAP    Past Surgical History:  Procedure Laterality Date  . CARDIAC CATHETERIZATION  1997   negative  . COLONOSCOPY  2007   Dr Cristina Gong  . DILATION AND CURETTAGE OF UTERUS    . UPPER GI ENDOSCOPY     X 2  . WISDOM TOOTH EXTRACTION      Social History   Social History  . Marital status: Divorced    Spouse name: N/A  . Number of children: N/A  . Years of education: N/A   Social History Main Topics  . Smoking status: Former Smoker    Quit date: 03/14/1984  . Smokeless tobacco: Never Used     Comment: smoked 1975-1986, up to 1.5 ppd  . Alcohol use No  . Drug use: No  . Sexual activity: Not Asked   Other Topics Concern  . None   Social History Narrative   Tourist information centre manager - [employer]  Exercise: 2-4 times a week    Family History  Problem Relation Age of Onset  . Adopted: Yes    Review of Systems  Constitutional: Negative for chills and fever.  Eyes: Negative for visual disturbance.  Respiratory: Positive for cough (alergy related). Negative for shortness of breath and wheezing.   Cardiovascular: Negative for chest pain, palpitations and leg swelling.  Gastrointestinal: Negative for abdominal pain, blood in stool, constipation, diarrhea and nausea.       GERD controlled  Genitourinary: Negative for dysuria and hematuria.  Musculoskeletal: Positive for arthralgias (mild). Negative for back pain.  Skin: Negative for color change and rash.  Neurological: Negative for light-headedness and headaches.  Psychiatric/Behavioral: Negative for dysphoric mood. The patient is not nervous/anxious.        Objective:   Vitals:    11/16/16 1520  BP: 108/68  Pulse: 67  Resp: 16  Temp: 97.8 F (36.6 C)  SpO2: 97%   Filed Weights   11/16/16 1520  Weight: 206 lb (93.4 kg)   Body mass index is 34.28 kg/m.  Wt Readings from Last 3 Encounters:  11/16/16 206 lb (93.4 kg)  07/28/16 194 lb 6.4 oz (88.2 kg)  07/21/16 197 lb (89.4 kg)     Physical Exam Constitutional: She appears well-developed and well-nourished. No distress.  HENT:  Head: Normocephalic and atraumatic.  Right Ear: External ear normal. Normal ear canal and TM Left Ear: External ear normal.  Normal ear canal and TM Mouth/Throat: Oropharynx is clear and moist.  Eyes: Conjunctivae and EOM are normal.  Neck: Neck supple. No tracheal deviation present. No thyromegaly present.  No carotid bruit  Cardiovascular: Normal rate, regular rhythm and normal heart sounds.   No murmur heard.  No edema. Pulmonary/Chest: Effort normal and breath sounds normal. No respiratory distress. She has no wheezes. She has no rales.  Breast: deferred to Gyn Abdominal: Soft. She exhibits no distension. There is no tenderness.  Lymphadenopathy: She has no cervical adenopathy.  Skin: Skin is warm and dry. She is not diaphoretic.  Psychiatric: She has a normal mood and affect. Her behavior is normal.       Assessment & Plan:   Physical exam: Screening blood work  ordered Immunizations   tdap deferred , flu vaccine given, shingrix discussed Colonoscopy  Last done 2008, due now - will scheudle Mammogram   Up to date  Gyn    Up to date  Dexa   Done 07/27/10 - normal - deferred until next year Eye exams  .uptd  EKG   Last done 2015 Exercise  - regular Weight  advised weight loss  Skin   No concerns    Substance abuse    none     See Problem List for Assessment and Plan of chronic medical problems.   Follow-up if needed prior to moving to [place] in November 2018

## 2016-11-16 NOTE — Assessment & Plan Note (Signed)
GERD controlled Continue daily medication  

## 2016-11-16 NOTE — Assessment & Plan Note (Signed)
Elevated last year, we'll recheck Most likely needs a statin, but will not start one since she is moving out of the area soon-we will discuss with her new primary care physician

## 2016-11-16 NOTE — Assessment & Plan Note (Signed)
Controlled, stable Continue current dose of medication - prozac 20 mg daily  

## 2016-11-16 NOTE — Patient Instructions (Addendum)
Test(s) ordered today. Your results will be released to [place] (or called to you) after review, usually within 72hours after test completion. If any changes need to be made, you will be notified at that same time.  All other Health Maintenance issues reviewed.   All recommended immunizations and age-appropriate screenings are up-to-date or discussed.  Flu immunization administered today.   Medications reviewed and updated.  No changes recommended at this time.     Health Maintenance, Female Adopting a healthy lifestyle and getting preventive care can go a long way to promote health and wellness. Talk with your health care provider about what schedule of regular examinations is right for you. This is a good chance for you to check in with your provider about disease prevention and staying healthy. In between checkups, there are plenty of things you can do on your own. Experts have done a lot of research about which lifestyle changes and preventive measures are most likely to keep you healthy. Ask your health care provider for more information. Weight and diet Eat a healthy diet  Be sure to include plenty of vegetables, fruits, low-fat dairy products, and lean protein.  Do not eat a lot of foods high in solid fats, added sugars, or salt.  Get regular exercise. This is one of the most important things you can do for your health. ? Most adults should exercise for at least 150 minutes each week. The exercise should increase your heart rate and make you sweat (moderate-intensity exercise). ? Most adults should also do strengthening exercises at least twice a week. This is in addition to the moderate-intensity exercise.  Maintain a healthy weight  Body mass index (BMI) is a measurement that can be used to identify possible weight problems. It estimates body fat based on height and weight. Your health care provider can help determine your BMI and help you achieve or maintain a healthy  weight.  For females 18 years of age and older: ? A BMI below 18.5 is considered underweight. ? A BMI of 18.5 to 24.9 is normal. ? A BMI of 25 to 29.9 is considered overweight. ? A BMI of 30 and above is considered obese.  Watch levels of cholesterol and blood lipids  You should start having your blood tested for lipids and cholesterol at 61 years of age, then have this test every 5 years.  You may need to have your cholesterol levels checked more often if: ? Your lipid or cholesterol levels are high. ? You are older than 61 years of age. ? You are at high risk for heart disease.  Cancer screening Lung Cancer  Lung cancer screening is recommended for adults 49-18 years old who are at high risk for lung cancer because of a history of smoking.  A yearly low-dose CT scan of the lungs is recommended for people who: ? Currently smoke. ? Have quit within the past 15 years. ? Have at least a 30-pack-year history of smoking. A pack year is smoking an average of one pack of cigarettes a day for 1 year.  Yearly screening should continue until it has been 15 years since you quit.  Yearly screening should stop if you develop a health problem that would prevent you from having lung cancer treatment.  Breast Cancer  Practice breast self-awareness. This means understanding how your breasts normally appear and feel.  It also means doing regular breast self-exams. Let your health care provider know about any changes, no matter how small.  If you are in your 20s or 30s, you should have a clinical breast exam (CBE) by a health care provider every 1-3 years as part of a regular health exam.  If you are 43 or older, have a CBE every year. Also consider having a breast X-ray (mammogram) every year.  If you have a family history of breast cancer, talk to your health care provider about genetic screening.  If you are at high risk for breast cancer, talk to your health care provider about having an  MRI and a mammogram every year.  Breast cancer gene (BRCA) assessment is recommended for women who have family members with BRCA-related cancers. BRCA-related cancers include: ? Breast. ? Ovarian. ? Tubal. ? Peritoneal cancers.  Results of the assessment will determine the need for genetic counseling and BRCA1 and BRCA2 testing.  Cervical Cancer Your health care provider may recommend that you be screened regularly for cancer of the pelvic organs (ovaries, uterus, and vagina). This screening involves a pelvic examination, including checking for microscopic changes to the surface of your cervix (Pap test). You may be encouraged to have this screening done every 3 years, beginning at age 86.  For women ages 54-65, health care providers may recommend pelvic exams and Pap testing every 3 years, or they may recommend the Pap and pelvic exam, combined with testing for human papilloma virus (HPV), every 5 years. Some types of HPV increase your risk of cervical cancer. Testing for HPV may also be done on women of any age with unclear Pap test results.  Other health care providers may not recommend any screening for nonpregnant women who are considered low risk for pelvic cancer and who do not have symptoms. Ask your health care provider if a screening pelvic exam is right for you.  If you have had past treatment for cervical cancer or a condition that could lead to cancer, you need Pap tests and screening for cancer for at least 20 years after your treatment. If Pap tests have been discontinued, your risk factors (such as having a new sexual partner) need to be reassessed to determine if screening should resume. Some women have medical problems that increase the chance of getting cervical cancer. In these cases, your health care provider may recommend more frequent screening and Pap tests.  Colorectal Cancer  This type of cancer can be detected and often prevented.  Routine colorectal cancer screening  usually begins at 61 years of age and continues through 61 years of age.  Your health care provider may recommend screening at an earlier age if you have risk factors for colon cancer.  Your health care provider may also recommend using home test kits to check for hidden blood in the stool.  A small camera at the end of a tube can be used to examine your colon directly (sigmoidoscopy or colonoscopy). This is done to check for the earliest forms of colorectal cancer.  Routine screening usually begins at age 13.  Direct examination of the colon should be repeated every 5-10 years through 61 years of age. However, you may need to be screened more often if early forms of precancerous polyps or small growths are found.  Skin Cancer  Check your skin from head to toe regularly.  Tell your health care provider about any new moles or changes in moles, especially if there is a change in a mole's shape or color.  Also tell your health care provider if you have a mole that is  larger than the size of a pencil eraser.  Always use sunscreen. Apply sunscreen liberally and repeatedly throughout the day.  Protect yourself by wearing long sleeves, pants, a wide-brimmed hat, and sunglasses whenever you are outside.  Heart disease, diabetes, and high blood pressure  High blood pressure causes heart disease and increases the risk of stroke. High blood pressure is more likely to develop in: ? People who have blood pressure in the high end of the normal range (130-139/85-89 mm Hg). ? People who are overweight or obese. ? People who are African American.  If you are 72-5 years of age, have your blood pressure checked every 3-5 years. If you are 68 years of age or older, have your blood pressure checked every year. You should have your blood pressure measured twice-once when you are at a hospital or clinic, and once when you are not at a hospital or clinic. Record the average of the two measurements. To check  your blood pressure when you are not at a hospital or clinic, you can use: ? An automated blood pressure machine at a pharmacy. ? A home blood pressure monitor.  If you are between 27 years and 77 years old, ask your health care provider if you should take aspirin to prevent strokes.  Have regular diabetes screenings. This involves taking a blood sample to check your fasting blood sugar level. ? If you are at a normal weight and have a low risk for diabetes, have this test once every three years after 61 years of age. ? If you are overweight and have a high risk for diabetes, consider being tested at a younger age or more often. Preventing infection Hepatitis B  If you have a higher risk for hepatitis B, you should be screened for this virus. You are considered at high risk for hepatitis B if: ? You were born in a country where hepatitis B is common. Ask your health care provider which countries are considered high risk. ? Your parents were born in a high-risk country, and you have not been immunized against hepatitis B (hepatitis B vaccine). ? You have HIV or AIDS. ? You use needles to inject street drugs. ? You live with someone who has hepatitis B. ? You have had sex with someone who has hepatitis B. ? You get hemodialysis treatment. ? You take certain medicines for conditions, including cancer, organ transplantation, and autoimmune conditions.  Hepatitis C  Blood testing is recommended for: ? Everyone born from [birth year] through 1965. ? Anyone with known risk factors for hepatitis C.  Sexually transmitted infections (STIs)  You should be screened for sexually transmitted infections (STIs) including gonorrhea and chlamydia if: ? You are sexually active and are younger than 61 years of age. ? You are older than 61 years of age and your health care provider tells you that you are at risk for this type of infection. ? Your sexual activity has changed since you were last screened and you  are at an increased risk for chlamydia or gonorrhea. Ask your health care provider if you are at risk.  If you do not have HIV, but are at risk, it may be recommended that you take a prescription medicine daily to prevent HIV infection. This is called pre-exposure prophylaxis (PrEP). You are considered at risk if: ? You are sexually active and do not regularly use condoms or know the HIV status of your partner(s). ? You take drugs by injection. ? You are sexually active  with a partner who has HIV.  Talk with your health care provider about whether you are at high risk of being infected with HIV. If you choose to begin PrEP, you should first be tested for HIV. You should then be tested every 3 months for as long as you are taking PrEP. Pregnancy  If you are premenopausal and you may become pregnant, ask your health care provider about preconception counseling.  If you may become pregnant, take 400 to 800 micrograms (mcg) of folic acid every day.  If you want to prevent pregnancy, talk to your health care provider about birth control (contraception). Osteoporosis and menopause  Osteoporosis is a disease in which the bones lose minerals and strength with aging. This can result in serious bone fractures. Your risk for osteoporosis can be identified using a bone density scan.  If you are 61 years of age or older, or if you are at risk for osteoporosis and fractures, ask your health care provider if you should be screened.  Ask your health care provider whether you should take a calcium or vitamin D supplement to lower your risk for osteoporosis.  Menopause may have certain physical symptoms and risks.  Hormone replacement therapy may reduce some of these symptoms and risks. Talk to your health care provider about whether hormone replacement therapy is right for you. Follow these instructions at home:  Schedule regular health, dental, and eye exams.  Stay current with your  immunizations.  Do not use any tobacco products including cigarettes, chewing tobacco, or electronic cigarettes.  If you are pregnant, do not drink alcohol.  If you are breastfeeding, limit how much and how often you drink alcohol.  Limit alcohol intake to no more than 1 drink per day for nonpregnant women. One drink equals 12 ounces of beer, 5 ounces of wine, or 1 ounces of hard liquor.  Do not use street drugs.  Do not share needles.  Ask your health care provider for help if you need support or information about quitting drugs.  Tell your health care provider if you often feel depressed.  Tell your health care provider if you have ever been abused or do not feel safe at home. This information is not intended to replace advice given to you by your health care provider. Make sure you discuss any questions you have with your health care provider. Document Released: 09/13/2010 Document Revised: 08/06/2015 Document Reviewed: 12/02/2014 Elsevier Interactive Patient Education  [employer].

## 2016-12-06 ENCOUNTER — Other Ambulatory Visit: Payer: Self-pay | Admitting: Internal Medicine

## 2016-12-12 ENCOUNTER — Other Ambulatory Visit: Payer: Self-pay | Admitting: Internal Medicine

## 2016-12-16 LAB — HM COLONOSCOPY

## 2017-01-01 ENCOUNTER — Other Ambulatory Visit: Payer: Self-pay | Admitting: Internal Medicine

## 2017-01-18 ENCOUNTER — Encounter: Payer: Self-pay | Admitting: Internal Medicine

## 2017-03-11 ENCOUNTER — Other Ambulatory Visit: Payer: Self-pay | Admitting: Internal Medicine

## 2017-06-01 MED FILL — VALACYCLOVIR HCL 500 MG TAB: 500 | 90 days supply | Qty: 90 | Fill #0

## 2017-06-01 MED FILL — HYDROCHLOROTHIAZIDE 12.5 MG: 12.5 | 90 days supply | Qty: 90 | Fill #0

## 2017-06-01 MED FILL — FLUoxetine HCL 20 MG CAPS: 20 | 30 days supply | Qty: 30 | Fill #0

## 2017-06-01 MED FILL — BREO ELLIPTA 100-25 MCG INH: 100-25 | 30 days supply | Qty: 60 | Fill #0

## 2017-06-02 MED FILL — OMEPRAZOLE DR 40 MG CAPSULE: 40 | 90 days supply | Qty: 90 | Fill #0

## 2017-07-06 ENCOUNTER — Other Ambulatory Visit: Payer: Self-pay | Admitting: Internal Medicine

## 2017-07-06 MED FILL — FLUoxetine HCL 20 MG CAPS: 20 | 30 days supply | Qty: 30 | Fill #1

## 2017-07-06 MED FILL — BREO ELLIPTA 100-25 MCG INH: 100-25 | 30 days supply | Qty: 60 | Fill #0

## 2017-07-31 DIAGNOSIS — R143 Flatulence: Secondary | ICD-10-CM | POA: Diagnosis not present

## 2017-07-31 DIAGNOSIS — R198 Other specified symptoms and signs involving the digestive system and abdomen: Secondary | ICD-10-CM | POA: Diagnosis not present

## 2017-07-31 DIAGNOSIS — K219 Gastro-esophageal reflux disease without esophagitis: Secondary | ICD-10-CM | POA: Diagnosis not present

## 2017-08-08 DIAGNOSIS — H25013 Cortical age-related cataract, bilateral: Secondary | ICD-10-CM | POA: Diagnosis not present

## 2017-08-08 DIAGNOSIS — H2513 Age-related nuclear cataract, bilateral: Secondary | ICD-10-CM | POA: Diagnosis not present

## 2017-08-08 DIAGNOSIS — H43811 Vitreous degeneration, right eye: Secondary | ICD-10-CM | POA: Diagnosis not present

## 2017-08-09 MED FILL — BREO ELLIPTA 100-25 MCG INH: 100-25 | 30 days supply | Qty: 60 | Fill #1

## 2017-08-09 MED FILL — FLUoxetine HCL 20 MG CAPS: 20 | 30 days supply | Qty: 30 | Fill #2

## 2017-08-21 ENCOUNTER — Ambulatory Visit: Payer: 59 | Admitting: Internal Medicine

## 2017-08-21 ENCOUNTER — Other Ambulatory Visit (INDEPENDENT_AMBULATORY_CARE_PROVIDER_SITE_OTHER): Payer: 59

## 2017-08-21 ENCOUNTER — Encounter: Payer: Self-pay | Admitting: Internal Medicine

## 2017-08-21 VITALS — BP 138/74 | HR 68 | Temp 97.8°F | Resp 16 | Wt 199.0 lb

## 2017-08-21 DIAGNOSIS — R001 Bradycardia, unspecified: Secondary | ICD-10-CM | POA: Diagnosis not present

## 2017-08-21 DIAGNOSIS — R5383 Other fatigue: Secondary | ICD-10-CM | POA: Diagnosis not present

## 2017-08-21 DIAGNOSIS — R0789 Other chest pain: Secondary | ICD-10-CM | POA: Insufficient documentation

## 2017-08-21 DIAGNOSIS — G4719 Other hypersomnia: Secondary | ICD-10-CM | POA: Insufficient documentation

## 2017-08-21 LAB — COMPREHENSIVE METABOLIC PANEL
ALBUMIN: 4.1 g/dL (ref 3.5–5.2)
ALK PHOS: 118 U/L — AB (ref 39–117)
ALT: 19 U/L (ref 0–35)
AST: 25 U/L (ref 0–37)
BILIRUBIN TOTAL: 0.4 mg/dL (ref 0.2–1.2)
BUN: 20 mg/dL (ref 6–23)
CALCIUM: 10 mg/dL (ref 8.4–10.5)
CO2: 31 mEq/L (ref 19–32)
Chloride: 102 mEq/L (ref 96–112)
Creatinine, Ser: 0.74 mg/dL (ref 0.40–1.20)
GFR: 84.45 mL/min (ref >60.00)
GLUCOSE: 84 mg/dL (ref 70–99)
POTASSIUM: 3.7 meq/L (ref 3.5–5.1)
Sodium: 139 mEq/L (ref 135–145)
TOTAL PROTEIN: 7.1 g/dL (ref 6.0–8.3)

## 2017-08-21 LAB — CBC WITH DIFFERENTIAL/PLATELET
BASOS ABS: 0 10*3/uL (ref 0.0–0.1)
Basophils Relative: 0.2 % (ref 0.0–3.0)
EOS PCT: 1.6 % (ref 0.0–5.0)
Eosinophils Absolute: 0.1 10*3/uL (ref 0.0–0.7)
HCT: 41 % (ref 36.0–46.0)
HEMOGLOBIN: 13.6 g/dL (ref 12.0–15.0)
LYMPHS ABS: 1.5 10*3/uL (ref 0.7–4.0)
Lymphocytes Relative: 18.9 % (ref 12.0–46.0)
MCHC: 33.3 g/dL (ref 30.0–36.0)
MCV: 85.2 fl (ref 78.0–100.0)
MONOS PCT: 8.1 % (ref 3.0–12.0)
Monocytes Absolute: 0.6 10*3/uL (ref 0.1–1.0)
NEUTROS PCT: 71.2 % (ref 43.0–77.0)
Neutro Abs: 5.5 10*3/uL (ref 1.4–7.7)
Platelets: 313 10*3/uL (ref 150.0–400.0)
RBC: 4.81 Mil/uL (ref 3.87–5.11)
RDW: 14.2 % (ref 11.5–15.5)
WBC: 7.8 10*3/uL (ref 4.0–10.5)

## 2017-08-21 LAB — TSH: TSH: 4.07 u[IU]/mL (ref 0.35–4.50)

## 2017-08-21 NOTE — Progress Notes (Signed)
Subjective:    Patient ID: Dana Mccoy, female    DOB: Oct 04, 1955, 62 y.o.   MRN: [id number]  HPI The patient is here for an acute visit.  Fatigue:  It started about 6-8 weeks ago.  She works 12 hour shifts and is not sure if it is just related to work or something else.  She has lost her get up and go and motivation to do things.  She was recently off for a few days - She was laying around and her her fitbit said her HR was 45 at rest.  She checked it manually as well and it was accurate.  She wonders if that is the cause of her fatigue.  She denies changes in medications, work schedule, diet or supplements in the past 6 weeks.  Her sleep is pretty good. She denies depression and anxiety.  She started her new job 4 months ago and it is busy but she likes it.    Intermittent esophageal spasms:  She has intermittent spasms in her esophagus.  She also has intermittent chest pain that is not new, but she thinks she is paying more attention to it.   It sometimes on the right side, left side, sharp, squeezing, transient.  She is unsure if some of the pain is concerning or not.       Medications and allergies reviewed with patient and updated if appropriate.  Patient Active Problem List   Diagnosis Date Noted  . Bronchitis 07/21/2016  . Cough 09/17/2015  . Anxiety 12/08/2013  . Obesity (BMI 30-39.9) 08/02/2013  . Rhinitis 08/02/2013  . Cervical stenosis of spine 04/26/2011  . Hyperlipidemia 06/05/2007  . Reactive airway disease 06/05/2007  . GERD 06/05/2007    Current Outpatient Medications on File Prior to Visit  Medication Sig Dispense Refill  . acyclovir (ZOVIRAX) 400 MG tablet Take 400 mg by mouth daily. Patient states she has a 500mg  tab (not an option in epic), filled by another MD    . albuterol (VENTOLIN HFA) 108 (90 Base) MCG/ACT inhaler INHALE 2 PUFFS EVERY 4 TO 6 HOURS AS NEEDED FOR COUGH OR WHEEZE *MAY USE 10-20 MIN PRIOR TO EXERCISE 18 g 3  . Ascorbic Acid (VITAMIN C  PO) Take 1 tablet by mouth daily. 1000mg     . BREO ELLIPTA 100-25 MCG/INH AEPB INHALE 1 PUFF BY MOUTH DAILY 60 each 5  . Cetirizine HCl (ZYRTEC ALLERGY) 10 MG CAPS Take 10 mg by mouth.    Marland Kitchen FLUoxetine (PROZAC) 20 MG tablet Take 20 mg by mouth daily.    . hydrochlorothiazide (MICROZIDE) 12.5 MG capsule TAKE 1 CAPSULE BY MOUTH EVERY DAY 90 capsule 1  . Multiple Vitamin (MULTIVITAMIN) tablet Take 1 tablet by mouth daily.    Marland Kitchen omeprazole (PRILOSEC) 40 MG capsule Take 40 mg by mouth daily.     No current facility-administered medications on file prior to visit.     Past Medical History:  Diagnosis Date  . GERD (gastroesophageal reflux disease)   . Hyperlipidemia   . Hypertension   . Reactive airway disease    post CAP    Past Surgical History:  Procedure Laterality Date  . CARDIAC CATHETERIZATION  1997   negative  . COLONOSCOPY  2007   Dr Cristina Gong  . DILATION AND CURETTAGE OF UTERUS    . UPPER GI ENDOSCOPY     X 2  . WISDOM TOOTH EXTRACTION      Social History   Socioeconomic History  .  Marital status: Divorced    Spouse name: Not on file  . Number of children: Not on file  . Years of education: Not on file  . Highest education level: Not on file  Occupational History  . Not on file  Social Needs  . Financial resource strain: Not on file  . Food insecurity:    Worry: Not on file    Inability: Not on file  . Transportation needs:    Medical: Not on file    Non-medical: Not on file  Tobacco Use  . Smoking status: Former Smoker    Last attempt to quit: 03/14/1984    Years since quitting: 33.4  . Smokeless tobacco: Never Used  . Tobacco comment: smoked 1975-1986, up to 1.5 ppd  Substance and Sexual Activity  . Alcohol use: No  . Drug use: No  . Sexual activity: Not on file  Lifestyle  . Physical activity:    Days per week: Not on file    Minutes per session: Not on file  . Stress: Not on file  Relationships  . Social connections:    Talks on phone: Not on file      Gets together: Not on file    Attends religious service: Not on file    Active member of club or organization: Not on file    Attends meetings of clubs or organizations: Not on file    Relationship status: Not on file  Other Topics Concern  . Not on file  Social History Narrative   Case manager - [employer]      Exercise: 2-4 times a week    Family History  Adopted: Yes    Review of Systems  Constitutional: Positive for fatigue. Negative for chills and fever.  Respiratory: Positive for cough (allergy related) and shortness of breath (with mild exertion at times). Negative for wheezing.   Cardiovascular: Positive for chest pain (intermittent, atypical, nothing new) and palpitations (transient - seconds - couple of times a week). Negative for leg swelling.  Musculoskeletal: Positive for arthralgias (chronic - not new).  Skin: Negative for color change.  Neurological: Positive for light-headedness (rare) and headaches (increased). Negative for numbness.  Psychiatric/Behavioral: Negative for dysphoric mood. The patient is not nervous/anxious.        Objective:   Vitals:   08/21/17 1305  BP: 138/74  Pulse: 68  Resp: 16  Temp: 97.8 F (36.6 C)  SpO2: 97%   BP Readings from Last 3 Encounters:  08/21/17 138/74  11/16/16 108/68  07/28/16 118/64   Wt Readings from Last 3 Encounters:  08/21/17 199 lb (90.3 kg)  11/16/16 206 lb (93.4 kg)  07/28/16 194 lb 6.4 oz (88.2 kg)   Body mass index is 33.12 kg/m.   Physical Exam  Constitutional: She appears well-developed and well-nourished. No distress.  HENT:  Head: Normocephalic and atraumatic.  Neck: Normal range of motion. No tracheal deviation present. No thyromegaly present.  Cardiovascular: Normal rate, regular rhythm and normal heart sounds.  No murmur heard. Pulmonary/Chest: Effort normal and breath sounds normal. No respiratory distress. She has no wheezes. She has no rales.  Musculoskeletal: She exhibits no edema.   Lymphadenopathy:    She has no cervical adenopathy.  Skin: Skin is warm and dry. She is not diaphoretic.  Psychiatric: She has a normal mood and affect. Her behavior is normal.           Assessment & Plan:    See Problem List for Assessment and Plan of  chronic medical problems.

## 2017-08-21 NOTE — Assessment & Plan Note (Signed)
?   Cause Check cbc, tsh, cmp No depression, anxiety or obvious sleep d/o New job 4 months ago with hectic schedule may be contributing ? Related to bradycardia / heart disease - referred to cardio Ensure good, regular sleep Work on exercising outside of work

## 2017-08-21 NOTE — Assessment & Plan Note (Signed)
Chest pain sounds atypical  Referring to cardiology for further evaluation, but likely low risk

## 2017-08-21 NOTE — Patient Instructions (Addendum)
  Test(s) ordered today. Your results will be released to [place] (or called to you) after review, usually within 72hours after test completion. If any changes need to be made, you will be notified at that same time.  Medications reviewed and updated.   No changes recommended at this time.   A referral was ordered for cardiology.

## 2017-08-21 NOTE — Assessment & Plan Note (Signed)
HR on fitbit at rest 43 - often in 50's Concern this is causing her fatigue No obvious meds causing dec HR Check tsh, cmp, cbc Will refer to cardio Monitor HR at home

## 2017-08-22 ENCOUNTER — Encounter: Payer: Self-pay | Admitting: Internal Medicine

## 2017-09-11 ENCOUNTER — Other Ambulatory Visit: Payer: Self-pay | Admitting: Internal Medicine

## 2017-09-11 MED FILL — FLUoxetine HCL 20 MG CAPS: 20 | 30 days supply | Qty: 30 | Fill #3

## 2017-09-11 MED FILL — HYDROCHLOROTHIAZIDE 12.5 MG: 12.5 | 90 days supply | Qty: 90 | Fill #0

## 2017-09-11 MED FILL — VALACYCLOVIR HCL 500 MG TAB: 500 | 90 days supply | Qty: 90 | Fill #1

## 2017-09-12 MED FILL — BREO ELLIPTA 100-25 MCG INH: 100-25 | 30 days supply | Qty: 60 | Fill #2

## 2017-09-22 DIAGNOSIS — D23122 Other benign neoplasm of skin of left lower eyelid, including canthus: Secondary | ICD-10-CM | POA: Diagnosis not present

## 2017-09-25 ENCOUNTER — Telehealth: Payer: Self-pay | Admitting: Emergency Medicine

## 2017-09-25 DIAGNOSIS — R001 Bradycardia, unspecified: Secondary | ICD-10-CM

## 2017-09-25 NOTE — Telephone Encounter (Signed)
Referral ordered

## 2017-09-25 NOTE — Telephone Encounter (Signed)
I do not see where a referral was entered.

## 2017-09-25 NOTE — Telephone Encounter (Signed)
Copied from [place] [id number]. Topic: General - Other >> Sep 25, 2017  2:48 PM Carolyn Stare wrote:  Pt call to say she saw the docotr last month and she was told she would be referred to a Cardiologist but has not heard anything and would like a call back to discuss    [phone]

## 2017-09-26 NOTE — Telephone Encounter (Signed)
LVM informing pt referral is being worked on.

## 2017-09-28 ENCOUNTER — Other Ambulatory Visit: Payer: Self-pay | Admitting: Internal Medicine

## 2017-09-28 DIAGNOSIS — Z1231 Encounter for screening mammogram for malignant neoplasm of breast: Secondary | ICD-10-CM

## 2017-10-02 DIAGNOSIS — Z01419 Encounter for gynecological examination (general) (routine) without abnormal findings: Secondary | ICD-10-CM | POA: Diagnosis not present

## 2017-10-02 DIAGNOSIS — Z6832 Body mass index (BMI) 32.0-32.9, adult: Secondary | ICD-10-CM | POA: Diagnosis not present

## 2017-10-02 DIAGNOSIS — Z124 Encounter for screening for malignant neoplasm of cervix: Secondary | ICD-10-CM | POA: Diagnosis not present

## 2017-10-02 LAB — HM PAP SMEAR

## 2017-10-06 DIAGNOSIS — D23122 Other benign neoplasm of skin of left lower eyelid, including canthus: Secondary | ICD-10-CM | POA: Diagnosis not present

## 2017-10-09 MED FILL — FLUoxetine HCL 20 MG CAPS: 20 | 30 days supply | Qty: 30 | Fill #4

## 2017-10-13 ENCOUNTER — Encounter: Payer: Self-pay | Admitting: Internal Medicine

## 2017-10-23 ENCOUNTER — Ambulatory Visit: Payer: 59

## 2017-10-27 MED FILL — BREO ELLIPTA 100-25 MCG INH: 100-25 | 30 days supply | Qty: 60 | Fill #3

## 2017-11-09 MED FILL — FLUoxetine HCL 20 MG CAPS: 20 | 30 days supply | Qty: 30 | Fill #5

## 2017-12-05 ENCOUNTER — Ambulatory Visit: Payer: 59 | Admitting: Cardiology

## 2017-12-11 MED FILL — FLUoxetine HCL 20 MG CAPS: 20 | 30 days supply | Qty: 30 | Fill #6

## 2017-12-11 MED FILL — HYDROCHLOROTHIAZIDE 12.5 MG: 12.5 | 90 days supply | Qty: 90 | Fill #1

## 2017-12-11 MED FILL — VALACYCLOVIR HCL 500 MG TAB: 500 | 90 days supply | Qty: 90 | Fill #2

## 2018-01-05 MED FILL — FLUoxetine HCL 20 MG CAPS: 20 | 30 days supply | Qty: 30 | Fill #7

## 2018-01-15 ENCOUNTER — Other Ambulatory Visit: Payer: Self-pay | Admitting: Internal Medicine

## 2018-01-15 MED FILL — MOMETASONE FUROATE 0.1% CRM: 0.1 | 25 days supply | Qty: 45 | Fill #0

## 2018-01-15 NOTE — Telephone Encounter (Signed)
Requested medication (s) are due for refill today:  yes  Requested medication (s) are on the active medication list:  yes  Future visit scheduled:  no  Last Refill: 07/19/16; 18 gm.; RF x 3  Requested Prescriptions  Pending Prescriptions Disp Refills   albuterol (VENTOLIN HFA) 108 (90 Base) MCG/ACT inhaler 18 g 3    Sig: INHALE 2 PUFFS EVERY 4 TO 6 HOURS AS NEEDED FOR COUGH OR WHEEZE *MAY USE 10-20 MIN PRIOR TO EXERCISE     Pulmonology:  Beta Agonists Failed - 01/15/2018  2:41 PM      Failed - One inhaler should last at least one month. If the patient is requesting refills earlier, contact the patient to check for uncontrolled symptoms.      Passed - Valid encounter within last 12 months    Recent Outpatient Visits          4 months ago Fatigue, unspecified type   [place], MD   1 year ago Preventative health care   [place], MD   1 year ago Acute bronchitis with asthma with acute exacerbation   [place], Stacy J, MD   1 year ago Moderate persistent reactive airway disease with acute exacerbation   [place], [place], DO   1 year ago Cough   [facility] -Chuck Hint, MD

## 2018-01-15 NOTE — Telephone Encounter (Signed)
Copied from [place] [id number]. Topic: Quick Communication - Rx Refill/Question >> Jan 15, 2018  2:37 PM Ahmed Prima L wrote: Medication: *albuterol (VENTOLIN HFA) 108 (90 Base) MCG/ACT inhaler Has the patient contacted their pharmacy? yes, has expired and no refills (Agent: If no, request that the patient contact the pharmacy for the refill.) (Agent: If yes, when and what did the pharmacy advise?)  Preferred Pharmacy (with phone number or street name):  [facility], [place]. [address] [place] [address] Phone: [phone] Fax: [phone]   Agent: Please be advised that RX refills may take up to 3 business days. We ask that you follow-up with your pharmacy.

## 2018-01-16 MED ORDER — ALBUTEROL SULFATE HFA 108 (90 BASE) MCG/ACT IN AERS: INHALATION_SPRAY | RESPIRATORY_TRACT | 3 refills | Status: DC

## 2018-01-16 MED FILL — VENTOLIN HFA 90 MCG INHALER: 108 (90 BAS | 17 days supply | Qty: 18 | Fill #0

## 2018-01-16 NOTE — Telephone Encounter (Signed)
Reviewed chart pt is up-to-date sent refills to [facility].../lmb  

## 2018-01-19 MED FILL — BREO ELLIPTA 100-25 MCG INH: 100-25 | 30 days supply | Qty: 60 | Fill #4

## 2018-01-22 ENCOUNTER — Encounter: Payer: Self-pay | Admitting: Internal Medicine

## 2018-01-29 DIAGNOSIS — M5137 Other intervertebral disc degeneration, lumbosacral region: Secondary | ICD-10-CM | POA: Diagnosis not present

## 2018-01-29 DIAGNOSIS — M531 Cervicobrachial syndrome: Secondary | ICD-10-CM | POA: Diagnosis not present

## 2018-01-29 DIAGNOSIS — M9901 Segmental and somatic dysfunction of cervical region: Secondary | ICD-10-CM | POA: Diagnosis not present

## 2018-01-29 DIAGNOSIS — M791 Myalgia, unspecified site: Secondary | ICD-10-CM | POA: Diagnosis not present

## 2018-01-29 DIAGNOSIS — M9904 Segmental and somatic dysfunction of sacral region: Secondary | ICD-10-CM | POA: Diagnosis not present

## 2018-01-29 DIAGNOSIS — M9902 Segmental and somatic dysfunction of thoracic region: Secondary | ICD-10-CM | POA: Diagnosis not present

## 2018-01-29 DIAGNOSIS — M9903 Segmental and somatic dysfunction of lumbar region: Secondary | ICD-10-CM | POA: Diagnosis not present

## 2018-02-06 MED FILL — FLUoxetine HCL 20 MG CAPS: 20 | 30 days supply | Qty: 30 | Fill #8

## 2018-02-07 ENCOUNTER — Ambulatory Visit: Payer: 59

## 2018-03-02 MED FILL — FLUoxetine HCL 20 MG CAPS: 20 | 30 days supply | Qty: 30 | Fill #9

## 2018-03-02 MED FILL — BREO ELLIPTA 100-25 MCG INH: 100-25 | 30 days supply | Qty: 60 | Fill #5

## 2018-03-16 ENCOUNTER — Ambulatory Visit
Admission: RE | Admit: 2018-03-16 | Discharge: 2018-03-16 | Disposition: A | Discharge status: Home / Self Care | Payer: 59 | Source: Ambulatory Visit | Attending: Internal Medicine | Admitting: Internal Medicine

## 2018-03-16 ENCOUNTER — Other Ambulatory Visit: Payer: Self-pay | Admitting: Internal Medicine

## 2018-03-16 DIAGNOSIS — Z1231 Encounter for screening mammogram for malignant neoplasm of breast: Secondary | ICD-10-CM | POA: Diagnosis not present

## 2018-03-16 MED FILL — VALACYCLOVIR HCL 500 MG TAB: 500 | 90 days supply | Qty: 90 | Fill #0

## 2018-03-16 MED FILL — HYDROCHLOROTHIAZIDE 12.5 MG: 12.5 | 90 days supply | Qty: 90 | Fill #0

## 2018-03-19 ENCOUNTER — Other Ambulatory Visit: Payer: Self-pay | Admitting: Internal Medicine

## 2018-03-19 DIAGNOSIS — R928 Other abnormal and inconclusive findings on diagnostic imaging of breast: Secondary | ICD-10-CM

## 2018-03-20 DIAGNOSIS — M9903 Segmental and somatic dysfunction of lumbar region: Secondary | ICD-10-CM | POA: Diagnosis not present

## 2018-03-20 DIAGNOSIS — M9904 Segmental and somatic dysfunction of sacral region: Secondary | ICD-10-CM | POA: Diagnosis not present

## 2018-03-20 DIAGNOSIS — M5137 Other intervertebral disc degeneration, lumbosacral region: Secondary | ICD-10-CM | POA: Diagnosis not present

## 2018-03-20 DIAGNOSIS — M791 Myalgia, unspecified site: Secondary | ICD-10-CM | POA: Diagnosis not present

## 2018-03-20 DIAGNOSIS — M9901 Segmental and somatic dysfunction of cervical region: Secondary | ICD-10-CM | POA: Diagnosis not present

## 2018-03-20 DIAGNOSIS — M531 Cervicobrachial syndrome: Secondary | ICD-10-CM | POA: Diagnosis not present

## 2018-03-20 DIAGNOSIS — M9902 Segmental and somatic dysfunction of thoracic region: Secondary | ICD-10-CM | POA: Diagnosis not present

## 2018-03-22 ENCOUNTER — Ambulatory Visit
Admission: RE | Admit: 2018-03-22 | Discharge: 2018-03-22 | Disposition: A | Discharge status: Home / Self Care | Payer: 59 | Source: Ambulatory Visit | Attending: Internal Medicine | Admitting: Internal Medicine

## 2018-03-22 DIAGNOSIS — R928 Other abnormal and inconclusive findings on diagnostic imaging of breast: Secondary | ICD-10-CM

## 2018-03-22 DIAGNOSIS — N6002 Solitary cyst of left breast: Secondary | ICD-10-CM | POA: Diagnosis not present

## 2018-03-27 ENCOUNTER — Telehealth: Payer: Self-pay

## 2018-03-27 DIAGNOSIS — R001 Bradycardia, unspecified: Secondary | ICD-10-CM

## 2018-03-27 NOTE — Telephone Encounter (Signed)
Referral ordered

## 2018-03-27 NOTE — Telephone Encounter (Signed)
Copied from [place] [id number]. Topic: Referral - Request for Referral >> Mar 27, 2018  1:08 PM Margot Ables wrote: Has patient seen PCP for this complaint? Yes - last summer (08/2017) *If NO, is insurance requiring patient see PCP for this issue before PCP can refer them? Referral for which specialty: Cardiology Preferred provider/office: same as before Reason for referral: cardiology advised pt due to multiple reschedule new referral is needed. Pt states not having any new issues. Pt states going for slow rate.

## 2018-04-03 NOTE — Progress Notes (Signed)
Cardiology Office Note:    Date:  04/06/2018   ID:  Fraser Din [place], DOB 1955/12/06, MRN [id number]  PCP:  Binnie Rail, MD  Cardiologist:  Buford Dresser, MD PhD  Referring MD: Binnie Rail, MD   CC: fatigue  History of Present Illness:    Dana Mccoy is a 63 y.o. female with a hx of hypertension, hyperlipidemia who is seen as a new consult at the request of Burns, Claudina Lick, MD for the evaluation and management of bradycardia.  Concerns: largest is fatigue. Works 3-12 hour shifts/week, feels physically and mentally exhausted. She has always had a slow pulse, has a fitbit. When really relaxed or sound asleep, notes that fitbit noted HR 43. Has felt occasional irregular heart beats. Also has occasional chest pressure, no radiation, not very bothersome, no other associated symptoms. Occurs about 1x/week, usually with walking or other activity. Has been occurring for about two months. Getting more frequent. No clear aggravating or alleviating factors. Lasts only seconds.  Smoked for 10 years in her 85s, quit 63. No alcohol. Drinks caffeine, about 3 cups of caffeinated juice drink/day. Had a lot of chest pressure about 20 years ago, had a false positive stress test and a negative cath. Thinks it was GI. Was told she has a murmur many years ago. No syncope. No LE edema, weight gain, PND, orthopnea.  Epworth sleepiness scale=12 for ease of falling asleep at inappropriate times. Endorses HTN, snoring, obesity, memory issues, awakening feeling not rested, and feeling daytime fatigue.  Past Medical History:  Diagnosis Date  . GERD (gastroesophageal reflux disease)   . Hyperlipidemia   . Hypertension   . Reactive airway disease    post CAP    Past Surgical History:  Procedure Laterality Date  . CARDIAC CATHETERIZATION  1997   negative  . COLONOSCOPY  2007   Dr Cristina Gong  . DILATION AND CURETTAGE OF UTERUS    . UPPER GI ENDOSCOPY     X 2  . WISDOM TOOTH  EXTRACTION      Current Medications: Current Outpatient Medications on File Prior to Visit  Medication Sig  . ACYCLOVIR PO Take 500 mg by mouth daily. Patient states she has a 500mg  tab (not an option in epic), filled by another MD  . albuterol (VENTOLIN HFA) 108 (90 Base) MCG/ACT inhaler INHALE 2 PUFFS EVERY 4 TO 6 HOURS AS NEEDED FOR COUGH OR WHEEZE *MAY USE 10-20 MIN PRIOR TO EXERCISE  . Ascorbic Acid (VITAMIN C PO) Take 1 tablet by mouth daily. 1000mg   . BREO ELLIPTA 100-25 MCG/INH AEPB INHALE 1 PUFF BY MOUTH DAILY  . Calcium Carb-Cholecalciferol (CALCIUM/VITAMIN D PO) Take by mouth daily.  . Cetirizine HCl (ZYRTEC ALLERGY) 10 MG CAPS Take 10 mg by mouth.  Marland Kitchen FLUoxetine (PROZAC) 20 MG tablet Take 20 mg by mouth daily.  . hydrochlorothiazide (MICROZIDE) 12.5 MG capsule Take 1 capsule (12.5 mg total) by mouth daily. -- Office visit needed for further refills  . Multiple Vitamin (MULTIVITAMIN) tablet Take 1 tablet by mouth daily.  . Naproxen Sodium (ALEVE) 220 MG CAPS Take 220 mg by mouth 2 (two) times daily.  Marland Kitchen omeprazole (PRILOSEC) 40 MG capsule Take 40 mg by mouth daily.  . phenylephrine (SUDAFED PE) 10 MG TABS tablet Take 10 mg by mouth as needed.  . Probiotic Product (ALIGN PO) Take by mouth daily.  . mometasone (NASONEX) 50 MCG/ACT nasal spray Nasonex 50 mcg/actuation Spray   No current facility-administered medications on file prior  to visit.      Allergies:   Penicillins and Lipitor [atorvastatin]   Social History   Socioeconomic History  . Marital status: Divorced    Spouse name: Not on file  . Number of children: Not on file  . Years of education: Not on file  . Highest education level: Not on file  Occupational History  . Not on file  Social Needs  . Financial resource strain: Not on file  . Food insecurity:    Worry: Not on file    Inability: Not on file  . Transportation needs:    Medical: Not on file    Non-medical: Not on file  Tobacco Use  . Smoking  status: Former Smoker    Last attempt to quit: 03/14/1984    Years since quitting: 34.0  . Smokeless tobacco: Never Used  . Tobacco comment: smoked 1975-1986, up to 1.5 ppd  Substance and Sexual Activity  . Alcohol use: No  . Drug use: No  . Sexual activity: Not on file  Lifestyle  . Physical activity:    Days per week: Not on file    Minutes per session: Not on file  . Stress: Not on file  Relationships  . Social connections:    Talks on phone: Not on file    Gets together: Not on file    Attends religious service: Not on file    Active member of club or organization: Not on file    Attends meetings of clubs or organizations: Not on file    Relationship status: Not on file  Other Topics Concern  . Not on file  Social History Narrative   Case manager - [employer]      Exercise: 2-4 times a week     Family History: The patient's family history is not on file. She was adopted.  ROS:   Please see the history of present illness.  Additional pertinent ROS:  Constitutional: Negative for chills, fever, night sweats, unintentional weight loss. Positive for fatigue HENT: Negative for ear pain and hearing loss.   Eyes: Negative for loss of vision and eye pain.  Respiratory: Negative for cough, sputum, shortness of breath, wheezing.   Cardiovascular: Positive for brief chest pressure, palpitations. Negative for PND, orthopnea, lower extremity edema and claudication.  Gastrointestinal: Negative for abdominal pain, melena, and hematochezia.  Genitourinary: Negative for dysuria and hematuria.  Musculoskeletal: Negative for falls and myalgias.  Skin: Negative for itching and rash.  Neurological: Negative for focal weakness, focal sensory changes and loss of consciousness.  Endo/Heme/Allergies: Does not bruise/bleed easily.    EKGs/Labs/Other Studies Reviewed:    The following studies were reviewed today: MPI 1997 Negative adequate Bruce, but breast attenuation vs. Ischemia seen on  imaging. Recommended for cath.  Cath 1997, reported as normal, I cannot see results.  EKG:  EKG is personally reviewed.  The ekg ordered today demonstrates sinus bradycardia, rate 58 bpm  Recent Labs: 08/21/2017: ALT 19; BUN 20; Creatinine, Ser 0.74; Hemoglobin 13.6; Platelets 313.0; Potassium 3.7; Sodium 139; TSH 4.07  Recent Lipid Panel    Component Value Date/Time   CHOL 231 (H) 04/04/2018 1010   CHOL 260 (H) 09/05/2014 1026   TRIG 195 (H) 04/04/2018 1010   TRIG 161 (H) 09/05/2014 1026   HDL 55 04/04/2018 1010   HDL 59 09/05/2014 1026   CHOLHDL 4.2 04/04/2018 1010   CHOLHDL 5 11/16/2016 1549   VLDL 65.6 (H) 11/16/2016 1549   LDLCALC 137 (H) 04/04/2018 1010  LDLCALC 169 (H) 09/05/2014 1026   LDLDIRECT 137.0 11/16/2016 1549    Physical Exam:    VS:  BP (!) 148/81 (BP Location: Left Arm, Patient Position: Sitting, Cuff Size: Large)   Pulse (!) 58   Ht 5\' 5"  (1.651 m)   Wt 201 lb 12.8 oz (91.5 kg)   BMI 33.58 kg/m     Wt Readings from Last 3 Encounters:  04/04/18 201 lb 12.8 oz (91.5 kg)  08/21/17 199 lb (90.3 kg)  11/16/16 206 lb (93.4 kg)     GEN: Well nourished, well developed in no acute distress HEENT: Normal NECK: No JVD; No carotid bruits LYMPHATICS: No lymphadenopathy CARDIAC: regular rhythm, normal S1 and S2, no murmurs, rubs, gallops. Radial and DP pulses 2+ bilaterally. RESPIRATORY:  Clear to auscultation without rales, wheezing or rhonchi  ABDOMEN: Soft, non-tender, non-distended MUSCULOSKELETAL:  No edema; No deformity  SKIN: Warm and dry NEUROLOGIC:  Alert and oriented x 3 PSYCHIATRIC:  Normal affect   ASSESSMENT:    1. Chest pain, unspecified type   2. Fatigue, unspecified type   3. Palpitation   4. Snores   5. Hyperlipidemia, unspecified hyperlipidemia type    PLAN:    1. Fatigue, snoring, high Epworth scale, daytime somnolence: concerning for sleep apnea -ordered sleep study  2. Chest pressure: exertional, increasing slightly in  frequency -treadmill stress test  3. Palpitations -7 day Zio  4. Hyperlipidemia, with hypertriglyceridemia: felt this was due to poor diet choices -repeat lipid panel, fasting  5. Prevention and counseling -recommend heart healthy/Mediterranean diet, with whole grains, fruits, vegetable, fish, lean meats, nuts, and olive oil. Limit salt. -recommend moderate walking, 3-5 times/week for 30-50 minutes each session. Aim for at least 150 minutes.week. Goal should be pace of 3 miles/hours, or walking 1.5 miles in 30 minutes -recommend avoidance of tobacco products. Avoid excess alcohol. -Additional risk factor control:  -Diabetes: A1c is 5.5 in 2017  -Lipids: as above  -Blood pressure control: hypertension, on HCTZ. Elevated above goal today, recheck next visit and adjust medications if warranted  -Weight: BMI 33 -ASCVD risk score: The 10-year ASCVD risk score Mikey Bussing DC Brooke Bonito., et al., 2013) is: 5.9%   Values used to calculate the score:     Age: 84 years     Sex: Female     Is Non-Hispanic African American: No     Diabetic: No     Tobacco smoker: No     Systolic Blood Pressure: 376 mmHg     Is BP treated: No     HDL Cholesterol: 55 mg/dL     Total Cholesterol: 231 mg/dL   Plan for follow up: 6 weeks, to review symptoms and results of testing  Medication Adjustments/Labs and Tests Ordered: Current medicines are reviewed at length with the patient today.  Concerns regarding medicines are outlined above.  Orders Placed This Encounter  Procedures  . Lipid panel  . EXERCISE TOLERANCE TEST (ETT)  . LONG TERM MONITOR (3-14 DAYS)  . EKG 12-Lead  . Split night study   No orders of the defined types were placed in this encounter.   Patient Instructions  Medication Instructions:  Your Physician recommend you continue on your current medication as directed.    If you need a refill on your cardiac medications before your next appointment, please call your pharmacy.   Lab work: Your  physician recommends that you return for lab work today (lipid)  If you have labs (blood work) drawn today and your tests  are completely normal, you will receive your results only by: Marland Kitchen MyChart Message (if you have MyChart) OR . A paper copy in the mail If you have any lab test that is abnormal or we need to change your treatment, we will call you to review the results.  Testing/Procedures: Your physician has recommended that you have a sleep study. This test records several body functions during sleep, including: brain activity, eye movement, oxygen and carbon dioxide blood levels, heart rate and rhythm, breathing rate and rhythm, the flow of air through your mouth and nose, snoring, body muscle movements, and chest and belly movement. Elvina Sidle  Our physician has recommended that you wear an   DAY ZIO-PATCH monitor. The Zio patch cardiac monitor continuously records heart rhythm data for up to 14 days, this is for patients being evaluated for multiple types heart rhythms. For the first 24 hours post application, please avoid getting the Zio monitor wet in the shower or by excessive sweating during exercise. After that, feel free to carry on with regular activities. Keep soaps and lotions away from the ZIO XT Patch.   This will be placed at our [facility] location - [address], [address].     Your physician has requested that you have an exercise tolerance test. For further information please visit [email or web address]. Please also follow instruction sheet, as given. [place]. [address]    Follow-Up: At [facility], you and your health needs are our priority.  As part of our continuing mission to provide you with exceptional heart care, we have created designated Provider Care Teams.  These Care Teams include your primary Cardiologist (physician) and Advanced Practice Providers (APPs -  Physician Assistants and Nurse Practitioners) who all work together to provide you with  the care you need, when you need it. You will need a follow up appointment in 6 weeks.  Please call our office 2 months in advance to schedule this appointment.  You may see Buford Dresser, MD or one of the following Advanced Practice Providers on your designated Care Team:   Rosaria Ferries, PA-C . Jory Sims, DNP, ANP        Signed, Buford Dresser, MD PhD 04/06/2018 10:40 PM    [place]

## 2018-04-04 ENCOUNTER — Telehealth: Payer: Self-pay | Admitting: *Deleted

## 2018-04-04 ENCOUNTER — Ambulatory Visit: Payer: 59 | Admitting: Cardiology

## 2018-04-04 ENCOUNTER — Encounter: Payer: Self-pay | Admitting: Cardiology

## 2018-04-04 VITALS — BP 148/81 | HR 58 | Ht 65.0 in | Wt 201.8 lb

## 2018-04-04 DIAGNOSIS — R5383 Other fatigue: Secondary | ICD-10-CM | POA: Diagnosis not present

## 2018-04-04 DIAGNOSIS — R002 Palpitations: Secondary | ICD-10-CM | POA: Diagnosis not present

## 2018-04-04 DIAGNOSIS — R0683 Snoring: Secondary | ICD-10-CM

## 2018-04-04 DIAGNOSIS — R079 Chest pain, unspecified: Secondary | ICD-10-CM

## 2018-04-04 DIAGNOSIS — E785 Hyperlipidemia, unspecified: Secondary | ICD-10-CM | POA: Diagnosis not present

## 2018-04-04 LAB — LIPID PANEL
CHOL/HDL RATIO: 4.2 ratio (ref 0.0–4.4)
Cholesterol, Total: 231 mg/dL — ABNORMAL HIGH (ref 100–199)
HDL: 55 mg/dL (ref >39)
LDL CALC: 137 mg/dL — AB (ref 0–99)
Triglycerides: 195 mg/dL — ABNORMAL HIGH (ref 0–149)
VLDL Cholesterol Cal: 39 mg/dL (ref 5–40)

## 2018-04-04 NOTE — Telephone Encounter (Signed)
Notified patient of sleep study appointment details.

## 2018-04-04 NOTE — Telephone Encounter (Signed)
-----   Message from Meryl Crutch, RN sent at 04/04/2018 10:04 AM EST ----- Pt has an order for a sleep study. Thanks

## 2018-04-04 NOTE — Patient Instructions (Signed)
Medication Instructions:  Your Physician recommend you continue on your current medication as directed.    If you need a refill on your cardiac medications before your next appointment, please call your pharmacy.   Lab work: Your physician recommends that you return for lab work today (lipid)  If you have labs (blood work) drawn today and your tests are completely normal, you will receive your results only by: Marland Kitchen MyChart Message (if you have MyChart) OR . A paper copy in the mail If you have any lab test that is abnormal or we need to change your treatment, we will call you to review the results.  Testing/Procedures: Your physician has recommended that you have a sleep study. This test records several body functions during sleep, including: brain activity, eye movement, oxygen and carbon dioxide blood levels, heart rate and rhythm, breathing rate and rhythm, the flow of air through your mouth and nose, snoring, body muscle movements, and chest and belly movement. Elvina Sidle  Our physician has recommended that you wear an   DAY ZIO-PATCH monitor. The Zio patch cardiac monitor continuously records heart rhythm data for up to 14 days, this is for patients being evaluated for multiple types heart rhythms. For the first 24 hours post application, please avoid getting the Zio monitor wet in the shower or by excessive sweating during exercise. After that, feel free to carry on with regular activities. Keep soaps and lotions away from the ZIO XT Patch.   This will be placed at our [facility] location - [address].     Your physician has requested that you have an exercise tolerance test. For further information please visit [email or web address]. Please also follow instruction sheet, as given. [place]. [address]    Follow-Up: At [facility], you and your health needs are our priority.  As part of our continuing mission to provide you with exceptional heart care, we have  created designated Provider Care Teams.  These Care Teams include your primary Cardiologist (physician) and Advanced Practice Providers (APPs -  Physician Assistants and Nurse Practitioners) who all work together to provide you with the care you need, when you need it. You will need a follow up appointment in 6 weeks.  Please call our office 2 months in advance to schedule this appointment.  You may see Buford Dresser, MD or one of the following Advanced Practice Providers on your designated Care Team:   Rosaria Ferries, PA-C . Jory Sims, DNP, ANP

## 2018-04-06 ENCOUNTER — Encounter: Payer: Self-pay | Admitting: Cardiology

## 2018-04-06 MED FILL — FLUoxetine HCL 20 MG CAPS: 20 | 90 days supply | Qty: 90 | Fill #0

## 2018-04-11 ENCOUNTER — Telehealth (HOSPITAL_COMMUNITY): Payer: Self-pay

## 2018-04-11 NOTE — Telephone Encounter (Signed)
Encounter complete. 

## 2018-04-12 ENCOUNTER — Telehealth (HOSPITAL_COMMUNITY): Payer: Self-pay

## 2018-04-12 ENCOUNTER — Inpatient Hospital Stay (HOSPITAL_COMMUNITY): Admission: RE | Admit: 2018-04-12 | Payer: 59 | Source: Ambulatory Visit

## 2018-04-12 NOTE — Telephone Encounter (Signed)
Encounter complete. 

## 2018-04-13 ENCOUNTER — Ambulatory Visit (HOSPITAL_COMMUNITY)
Admission: RE | Admit: 2018-04-13 | Discharge: 2018-04-13 | Disposition: A | Discharge status: Home / Self Care | Payer: 59 | Source: Ambulatory Visit | Attending: Cardiology | Admitting: Cardiology

## 2018-04-13 DIAGNOSIS — R079 Chest pain, unspecified: Secondary | ICD-10-CM | POA: Diagnosis not present

## 2018-04-16 ENCOUNTER — Ambulatory Visit (INDEPENDENT_AMBULATORY_CARE_PROVIDER_SITE_OTHER): Payer: 59

## 2018-04-16 DIAGNOSIS — R002 Palpitations: Secondary | ICD-10-CM | POA: Diagnosis not present

## 2018-04-16 DIAGNOSIS — R079 Chest pain, unspecified: Secondary | ICD-10-CM

## 2018-04-16 DIAGNOSIS — R5383 Other fatigue: Secondary | ICD-10-CM | POA: Diagnosis not present

## 2018-04-16 LAB — EXERCISE TOLERANCE TEST
CSEPED: 7 min
CSEPHR: 93 %
Estimated workload: 9.5 METS
Exercise duration (sec): 41 s
MPHR: 158 {beats}/min
Peak HR: 148 {beats}/min
RPE: 17
Rest HR: 59 {beats}/min

## 2018-04-17 ENCOUNTER — Ambulatory Visit (INDEPENDENT_AMBULATORY_CARE_PROVIDER_SITE_OTHER): Payer: 59

## 2018-04-17 ENCOUNTER — Encounter (INDEPENDENT_AMBULATORY_CARE_PROVIDER_SITE_OTHER): Payer: Self-pay | Admitting: Orthopaedic Surgery

## 2018-04-17 ENCOUNTER — Ambulatory Visit (INDEPENDENT_AMBULATORY_CARE_PROVIDER_SITE_OTHER): Payer: Self-pay

## 2018-04-17 ENCOUNTER — Ambulatory Visit (INDEPENDENT_AMBULATORY_CARE_PROVIDER_SITE_OTHER): Payer: 59 | Admitting: Orthopaedic Surgery

## 2018-04-17 VITALS — BP 147/77 | HR 59 | Ht 65.0 in | Wt 196.0 lb

## 2018-04-17 DIAGNOSIS — M25561 Pain in right knee: Secondary | ICD-10-CM

## 2018-04-17 DIAGNOSIS — M25562 Pain in left knee: Secondary | ICD-10-CM | POA: Diagnosis not present

## 2018-04-17 DIAGNOSIS — G8929 Other chronic pain: Secondary | ICD-10-CM | POA: Insufficient documentation

## 2018-04-17 DIAGNOSIS — M545 Low back pain, unspecified: Secondary | ICD-10-CM | POA: Insufficient documentation

## 2018-04-17 NOTE — Progress Notes (Signed)
Office Visit Note   Patient: Dana Mccoy           Date of Birth: November 11, 1955           MRN: [id number] Visit Date: 04/17/2018              Requested by: Binnie Rail, MD [place], [place] [id number] PCP: Binnie Rail, MD   Assessment & Plan: Visit Diagnoses:  1. Chronic pain of right knee   2. Right low back pain, unspecified chronicity, unspecified whether sciatica present   3. Chronic pain of left knee     Plan: I suspect the majority of her symptoms are referred from her lumbar spine.  I like to try a course of physical therapy.  There is no significant evidence of degenerative change in her hips or her knees.  Some of her pain may be referable to her right hip where she has had some very minimal groin discomfort but x-rays were negative.  We will try over-the-counter medicines and therapy and just reevaluate in the next 4 weeks if no improvement consider MRI scan of lumbar spine and possibly pelvis  Follow-Up Instructions: Return if symptoms worsen or fail to improve.   Orders:  Orders Placed This Encounter  Procedures  . XR Lumbar Spine 2-3 Views  . XR Pelvis 1-2 Views  . XR Knee 1-2 Views Left  . XR Knee 1-2 Views Right  . Ambulatory referral to Physical Therapy   No orders of the defined types were placed in this encounter.     Procedures: No procedures performed   Clinical Data: No additional findings.   Subjective: Chief Complaint  Patient presents with  . Hip Pain    Right hip pain    HPI  Dana Mccoy is 63yr old female who presents today with right hip pain X 1.5years. Worsening. Patient remembers falling on the side of tub in August of 2018. Walking and steps makes the pain worse. Patient points to her buttock and groin as the areas of pain. She takes Aleve almost daily, which takes the edge off. Patient also states that both knees are hurting her as well. They have been hurting for years, but has gotten worse in the last  year. The right side is worse. The pain is located at her patella on each side. Has been experiencing predominately pain on the right side of her buttock and right hip.  Rarely does she have any right groin pain.  Occasionally she will experience some pain in her thigh.  Seems to be worse when she sits.  Also has been experiencing some stiffness in her knees.  No numbness or tingling below her knees.  No bowel bladder changes  Review of Systems  Constitutional: Positive for fatigue.  HENT: Negative for ear pain.   Eyes: Negative for redness.  Respiratory: Positive for cough.   Cardiovascular: Positive for leg swelling.  Genitourinary: Negative for difficulty urinating.  Musculoskeletal: Positive for joint swelling.  Skin: Negative for rash.  Allergic/Immunologic: Positive for food allergies.  Neurological: Negative for weakness.  Hematological: Bruises/bleeds easily.  Psychiatric/Behavioral: Positive for sleep disturbance. Negative for confusion.     Objective: Vital Signs: BP (!) 147/77 (BP Location: Left Arm, Patient Position: Sitting, Cuff Size: Normal)   Pulse (!) 59   Ht 5\' 5"  (1.651 m)   Wt 196 lb (88.9 kg)   BMI 32.62 kg/m   Physical Exam Constitutional:      Appearance:  She is well-developed.  Eyes:     Pupils: Pupils are equal, round, and reactive to light.  Pulmonary:     Effort: Pulmonary effort is normal.  Skin:    General: Skin is warm and dry.  Neurological:     Mental Status: She is alert and oriented to person, place, and time.  Psychiatric:        Behavior: Behavior normal.     Ortho Exam awake alert and oriented x3.  Comfortable sitting.  Very minimal decreased motion of right hip compared to left.  Straight leg raise negative.  No percussible back pain no pain over either greater trochanter.  Neither knee is hot red warm or swollen.  No effusion.  No medial lateral joint pain no pain with patella compression.  Neurologically intact distally  Specialty  Comments:  No specialty comments available.  Imaging: Xr Knee 1-2 Views Left  Result Date: 04/17/2018 Films of the left knee were obtained in 2 projections standing.  Joint spaces appear to be well-maintained.  No acute change.  Normal alignment.  No ectopic calcification.  No significant sclerosis in the subchondral region of either the medial lateral compartment or osteophytes  Xr Knee 1-2 Views Right  Result Date: 04/17/2018 Films of the right knee were obtained in several projections standing.  May be very minimal varus of about 1 degree.  Very early osteophytes along the medial compartment and some mild subchondral sclerosis.  No ectopic calcification.  Films are consistent with mild osteoarthritis  Xr Lumbar Spine 2-3 Views  Result Date: 04/17/2018 Films of the lumbar spine obtained in 2 projections.  No evidence of listhesis.  Some degenerative changes at the L2-3 and L1-2 disc spaces.  No acute change.  Some mild facet sclerosis at L5-S1.  Spine is straight without evidence of curvature  Xr Pelvis 1-2 Views  Result Date: 04/17/2018 AP the pelvis demonstrates no pathology about the sacroiliac joints.  The hip spaces are well-maintained.  Some irregularity about the greater trochanters of both hips but asymptomatic in those regions    PMFS History: Patient Active Problem List   Diagnosis Date Noted  . Chronic pain of left knee 04/17/2018  . Right low back pain 04/17/2018  . Fatigue 08/21/2017  . Bradycardia 08/21/2017  . Atypical chest pain 08/21/2017  . Bronchitis 07/21/2016  . Cough 09/17/2015  . Anxiety 12/08/2013  . Obesity (BMI 30-39.9) 08/02/2013  . Rhinitis 08/02/2013  . Cervical stenosis of spine 04/26/2011  . Hyperlipidemia 06/05/2007  . Reactive airway disease 06/05/2007  . GERD 06/05/2007   Past Medical History:  Diagnosis Date  . GERD (gastroesophageal reflux disease)   . Hyperlipidemia   . Hypertension   . Reactive airway disease    post CAP    Family  History  Adopted: Yes    Past Surgical History:  Procedure Laterality Date  . CARDIAC CATHETERIZATION  1997   negative  . COLONOSCOPY  2007   Dr Cristina Gong  . DILATION AND CURETTAGE OF UTERUS    . UPPER GI ENDOSCOPY     X 2  . WISDOM TOOTH EXTRACTION     Social History   Occupational History  . Not on file  Tobacco Use  . Smoking status: Former Smoker    Last attempt to quit: 03/14/1984    Years since quitting: 34.1  . Smokeless tobacco: Never Used  . Tobacco comment: smoked 1975-1986, up to 1.5 ppd  Substance and Sexual Activity  . Alcohol use: No  .  Drug use: No  . Sexual activity: Not on file       

## 2018-05-01 ENCOUNTER — Other Ambulatory Visit: Payer: Self-pay

## 2018-05-01 ENCOUNTER — Ambulatory Visit: Payer: 59 | Attending: Orthopaedic Surgery

## 2018-05-01 DIAGNOSIS — R293 Abnormal posture: Secondary | ICD-10-CM | POA: Diagnosis not present

## 2018-05-01 DIAGNOSIS — M62830 Muscle spasm of back: Secondary | ICD-10-CM | POA: Insufficient documentation

## 2018-05-01 DIAGNOSIS — M545 Low back pain, unspecified: Secondary | ICD-10-CM

## 2018-05-01 NOTE — Patient Instructions (Signed)
Knee to chest stretching with contract relax and adduction variations 2-3x / day  23- reps 10-30 sec.   And hip ER with green band 2x/day 10 reps  All gentle and stop if painful

## 2018-05-01 NOTE — Therapy (Signed)
[place], [place], [address] Phone: [phone]   Fax:  [phone]  Physical Therapy Evaluation  Patient Details  Name: Dana Mccoy MRN: [id number] Date of Birth: 11-14-1955 Referring Provider (PT): Joni Fears, MD   Encounter Date: 05/01/2018  PT End of Session - 05/01/18 1232    Visit Number  1    Number of Visits  12    Date for PT Re-Evaluation  06/08/18    Authorization Type  [employer]    PT Start Time  1230    PT Stop Time  1325    PT Time Calculation (min)  55 min    Activity Tolerance  Patient tolerated treatment well    Behavior During Therapy  [facility] for tasks assessed/performed       Past Medical History:  Diagnosis Date  . GERD (gastroesophageal reflux disease)   . Hyperlipidemia   . Hypertension   . Reactive airway disease    post CAP    Past Surgical History:  Procedure Laterality Date  . CARDIAC CATHETERIZATION  1997   negative  . COLONOSCOPY  2007   Dr Cristina Gong  . DILATION AND CURETTAGE OF UTERUS    . UPPER GI ENDOSCOPY     X 2  . WISDOM TOOTH EXTRACTION      There were no vitals filed for this visit.   Subjective Assessment - 05/01/18 1237    Subjective  She reports fall 1.5 years ago and pain started.   She has seen chiropractor for  a couple of months with no long term benefit.     No injections.     No limits generally but limps when incr pain.          Has tried heat and cold  and aleve for decr pain.  Does piriformis stretch but does not relieve pain.     Limitations  Walking;House hold activities   Less general activity on feet.  stairs   Diagnostic tests  Negative xrays hip /knee/back.     Patient Stated Goals  She wants to learn exercises for decr pain.     Currently in Pain?  Yes    Pain Score  4     Pain Location  Buttocks   and RT knee but milder   Pain Orientation  Right    Pain Descriptors / Indicators  Burning    Pain Type  Chronic pain     Pain Onset  More than a month ago    Pain Frequency  Intermittent   for hours but pain is daily   Aggravating Factors   activity on feet    Pain Relieving Factors  heat , meds, stretch           [facility] PT Assessment - 05/01/18 0001      Assessment   Medical Diagnosis  RT LBP    Referring Provider (PT)  Joni Fears, MD    Onset Date/Surgical Date  --   back abd RT hip /knee 1.5 yeara ago with fall   Next MD Visit  As needed    Prior Therapy  No       Precautions   Precautions  None      Restrictions   Weight Bearing Restrictions  No      Balance Screen   Has the patient fallen in the past 6 months  No    Has the patient had a decrease in activity level because of  a fear of falling?   No      Prior Function   Level of Independence  Independent      Cognition   Overall Cognitive Status  Within Functional Limits for tasks assessed      Observation/Other Assessments   Focus on Therapeutic Outcomes (FOTO)   41% limited      Posture/Postural Control   Posture Comments  RT shoulder higher  with trunk slightly to LT .  pelvis appeared level in standing      ROM / Strength   AROM / PROM / Strength  AROM;PROM;Strength      AROM   AROM Assessment Site  Lumbar    Lumbar Flexion  90    Lumbar Extension  30    Lumbar - Right Side Bend  20    Lumbar - Left Side Bend  15    Lumbar - Right Rotation  WFL    Lumbar - Left Rotation  WFL      PROM   Overall PROM Comments  WNL in hips      Strength   Overall Strength Comments  WNL Le and good abdominals       Flexibility   Soft Tissue Assessment /Muscle Length  yes      Palpation   SI assessment   RT ASIS lower than LT       Palpation comment  In supine Rt leg shorter                Objective measurements completed on examination: See above findings.      [place] Adult PT Treatment/Exercise - 05/01/18 0001      Exercises   Exercises  Knee/Hip      Knee/Hip Exercises: Stretches   Other Knee/Hip  Stretches  Knee to chest RT with  and without adduction 30 sec x 2      Knee/Hip Exercises: Seated   Other Seated Knee/Hip Exercises  Green band RT hip ER x 10      Manual Therapy   Manual Therapy  Muscle Energy Technique;Manual Traction;Joint mobilization    Joint Mobilization  PA to LT sacral rdge  with 3 deep breaths    Manual Traction  RT leg pull 2x 30 osscilations    Muscle Energy Technique  for ant Rt ilia knee to chest Rt             PT Education - 05/01/18 1232    Education Details  POC, HEP    Person(s) Educated  Patient    Methods  Explanation;Demonstration;Tactile cues;Verbal cues;Handout    Comprehension  Verbalized understanding;Returned demonstration       PT Short Term Goals - 05/01/18 1330      PT SHORT TERM GOAL #1   Title  She will be indpendent with initial hEp     Time  2    Period  Weeks    Status  New      PT SHORT TERM GOAL #2   Title  She will report pain decr 20 % or mroe with activity on feet.     Time  3    Period  Weeks    Status  New        PT Long Term Goals - 05/01/18 1330      PT LONG TERM GOAL #1   Title  She will be indpendent with all hEp issued    Time  6    Period  Weeks  Status  New      PT LONG TERM GOAL #2   Title  She will report pain no higher than 1-2 /10 with normal work and home tasks.     Time  6    Period  Weeks    Status  New      PT LONG TERM GOAL #3   Title  She will report able to walk without limp    Time  6    Period  Weeks    Status  New      PT LONG TERM GOAL #4   Title  FOTO will improve to 30% limited or less    Time  6    Period  Weeks    Status  New             Plan - 05/01/18 1233    Clinical Impression Statement  Ms Bassette presents with chronic back /hip and knee pain on RT. she indicates from a fall 1.5 years ago. Chirpractic help did not last. She appears to have some pelvic asymetry and spasm in glut/pireiformis muscles .  Skilled PT with manual and some good HEP  should help with pain.     Clinical Presentation  Stable    Clinical Decision Making  Low    Rehab Potential  Good    PT Frequency  2x / week    PT Duration  6 weeks    PT Treatment/Interventions  Passive range of motion;Manual techniques;Patient/family education;Therapeutic exercise;Cryotherapy;Iontophoresis 4mg /ml Dexamethasone;Moist Heat;Therapeutic activities;Dry needling    PT Next Visit Plan  [facility] HEP , modalities and manual for pain ,   add to HEP    Consulted and Agree with Plan of Care  Patient       Patient will benefit from skilled therapeutic intervention in order to improve the following deficits and impairments:  Pain, Postural dysfunction, Decreased strength, Decreased activity tolerance, Decreased range of motion, Increased muscle spasms  Visit Diagnosis: Right low back pain, unspecified chronicity, unspecified whether sciatica present  Muscle spasm of back  Abnormal posture     Problem List Patient Active Problem List   Diagnosis Date Noted  . Chronic pain of left knee 04/17/2018  . Right low back pain 04/17/2018  . Fatigue 08/21/2017  . Bradycardia 08/21/2017  . Atypical chest pain 08/21/2017  . Bronchitis 07/21/2016  . Cough 09/17/2015  . Anxiety 12/08/2013  . Obesity (BMI 30-39.9) 08/02/2013  . Rhinitis 08/02/2013  . Cervical stenosis of spine 04/26/2011  . Hyperlipidemia 06/05/2007  . Reactive airway disease 06/05/2007  . GERD 06/05/2007    Darrel Hoover  PT 05/01/2018, 1:43 PM  [facility] [address] [place], [place], [address] Phone: [phone]   Fax:  [phone]  Name: Dana Mccoy MRN: [id number] Date of Birth: March 20, 1955

## 2018-05-07 ENCOUNTER — Ambulatory Visit (HOSPITAL_BASED_OUTPATIENT_CLINIC_OR_DEPARTMENT_OTHER): Payer: 59

## 2018-05-11 ENCOUNTER — Encounter: Payer: Self-pay | Admitting: Physical Therapy

## 2018-05-11 ENCOUNTER — Ambulatory Visit: Payer: 59 | Admitting: Physical Therapy

## 2018-05-11 DIAGNOSIS — M545 Low back pain, unspecified: Secondary | ICD-10-CM

## 2018-05-11 DIAGNOSIS — R293 Abnormal posture: Secondary | ICD-10-CM

## 2018-05-11 DIAGNOSIS — M62830 Muscle spasm of back: Secondary | ICD-10-CM

## 2018-05-11 NOTE — Therapy (Signed)
[place], [place], [address] Phone: [phone]   Fax:  [phone]  Physical Therapy Treatment  Patient Details  Name: Dana Mccoy MRN: [id number] Date of Birth: October 09, 1955 Referring Provider (PT): Joni Fears, MD   Encounter Date: 05/11/2018  PT End of Session - 05/11/18 0858    Visit Number  2    Number of Visits  12    Date for PT Re-Evaluation  06/08/18    Authorization Type  [employer]    PT Start Time  0855   10 minutes late    PT Stop Time  0928    PT Time Calculation (min)  33 min       Past Medical History:  Diagnosis Date  . GERD (gastroesophageal reflux disease)   . Hyperlipidemia   . Hypertension   . Reactive airway disease    post CAP    Past Surgical History:  Procedure Laterality Date  . CARDIAC CATHETERIZATION  1997   negative  . COLONOSCOPY  2007   Dr Cristina Gong  . DILATION AND CURETTAGE OF UTERUS    . UPPER GI ENDOSCOPY     X 2  . WISDOM TOOTH EXTRACTION      There were no vitals filed for this visit.  Subjective Assessment - 05/11/18 0857    Subjective  I think I am better this week. Not much pain now.     Currently in Pain?  Yes    Pain Score  2     Pain Location  Buttocks    Pain Orientation  Right    Pain Descriptors / Indicators  Burning;Aching;Throbbing    Aggravating Factors   more activity on feet, depends on what I am doing     Pain Relieving Factors  heat, meds, stretch                       [facility] Adult PT Treatment/Exercise - 05/11/18 0001      Knee/Hip Exercises: Stretches   Active Hamstring Stretch  Right;2 reps;30 seconds    Other Knee/Hip Stretches  Knee to chest RT with  and without adduction 30 sec x 2   instructed with contract relax x 2 each             PT Education - 05/11/18 0929    Education Details  HEP    Person(s) Educated  Patient    Methods  Explanation;Handout    Comprehension  Verbalized  understanding       PT Short Term Goals - 05/01/18 1330      PT SHORT TERM GOAL #1   Title  She will be indpendent with initial hEp     Time  2    Period  Weeks    Status  New      PT SHORT TERM GOAL #2   Title  She will report pain decr 20 % or mroe with activity on feet.     Time  3    Period  Weeks    Status  New        PT Long Term Goals - 05/01/18 1330      PT LONG TERM GOAL #1   Title  She will be indpendent with all hEp issued    Time  6    Period  Weeks    Status  New      PT LONG TERM GOAL #2   Title  She  will report pain no higher than 1-2 /10 with normal work and home tasks.     Time  6    Period  Weeks    Status  New      PT LONG TERM GOAL #3   Title  She will report able to walk without limp    Time  6    Period  Weeks    Status  New      PT LONG TERM GOAL #4   Title  FOTO will improve to 30% limited or less    Time  6    Period  Weeks    Status  New            Plan - 05/11/18 0737    Clinical Impression Statement  Pt repots some improvement in morning pain however pain does increase throughout the day. Reviewed HEP with instuctions for contract relax and slow eccentric control with green theraband. Progressed to core and hip stabilization exercises and updated HEP.     PT Next Visit Plan  [facility] HEP , modalities and manual for pain ,   add to HEP    PT Home Exercise Plan  Knee to chest knee to opposite shoulder with contract relax, Seated green band ER , supine clam, bridge with ball squeeze, pelvic tilts     Consulted and Agree with Plan of Care  Patient       Patient will benefit from skilled therapeutic intervention in order to improve the following deficits and impairments:  Pain, Postural dysfunction, Decreased strength, Decreased activity tolerance, Decreased range of motion, Increased muscle spasms  Visit Diagnosis: Right low back pain, unspecified chronicity, unspecified whether sciatica present  Muscle spasm of back  Abnormal  posture     Problem List Patient Active Problem List   Diagnosis Date Noted  . Chronic pain of left knee 04/17/2018  . Right low back pain 04/17/2018  . Fatigue 08/21/2017  . Bradycardia 08/21/2017  . Atypical chest pain 08/21/2017  . Bronchitis 07/21/2016  . Cough 09/17/2015  . Anxiety 12/08/2013  . Obesity (BMI 30-39.9) 08/02/2013  . Rhinitis 08/02/2013  . Cervical stenosis of spine 04/26/2011  . Hyperlipidemia 06/05/2007  . Reactive airway disease 06/05/2007  . GERD 06/05/2007    Dorene Ar, PTA 05/11/2018, 9:44 AM  [facility] [address] [place], [place], [address] Phone: [phone]   Fax:  [phone]  Name: Dana Mccoy MRN: [id number] Date of Birth: 18-Oct-1955

## 2018-05-14 ENCOUNTER — Other Ambulatory Visit: Payer: Self-pay | Admitting: Internal Medicine

## 2018-05-14 ENCOUNTER — Ambulatory Visit: Payer: 59 | Admitting: Physical Therapy

## 2018-05-14 MED FILL — BREO ELLIPTA 100-25 MCG INH: 100-25 | 30 days supply | Qty: 60 | Fill #0

## 2018-05-17 ENCOUNTER — Ambulatory Visit: Payer: 59 | Attending: Orthopaedic Surgery

## 2018-05-17 DIAGNOSIS — M545 Low back pain, unspecified: Secondary | ICD-10-CM

## 2018-05-17 DIAGNOSIS — M62830 Muscle spasm of back: Secondary | ICD-10-CM | POA: Insufficient documentation

## 2018-05-17 DIAGNOSIS — R293 Abnormal posture: Secondary | ICD-10-CM | POA: Diagnosis not present

## 2018-05-17 NOTE — Therapy (Signed)
[place], [place], [address] Phone: [phone]   Fax:  [phone]  Physical Therapy Treatment  Patient Details  Name: Dana Mccoy MRN: [id number] Date of Birth: [birth year] Referring Provider (PT): Joni Fears, MD   Encounter Date: 05/17/2018  PT End of Session - 05/17/18 1239    Visit Number  3    Number of Visits  12    Date for PT Re-Evaluation  06/08/18    Authorization Type  [employer]    PT Start Time  2725    PT Stop Time  1325    PT Time Calculation (min)  50 min    Activity Tolerance  Patient tolerated treatment well    Behavior During Therapy  [facility] for tasks assessed/performed       Past Medical History:  Diagnosis Date  . GERD (gastroesophageal reflux disease)   . Hyperlipidemia   . Hypertension   . Reactive airway disease    post CAP    Past Surgical History:  Procedure Laterality Date  . CARDIAC CATHETERIZATION  1997   negative  . COLONOSCOPY  2007   Dr Cristina Gong  . DILATION AND CURETTAGE OF UTERUS    . UPPER GI ENDOSCOPY     X 2  . WISDOM TOOTH EXTRACTION      There were no vitals filed for this visit.  Subjective Assessment - 05/17/18 1237    Subjective  Better than other day.  Called in other day and did a 1/3 of day due to back and leg pain.     Pain Score  3     Pain Location  Back    Pain Orientation  Right    Pain Descriptors / Indicators  Aching;Burning;Throbbing    Pain Type  Chronic pain    Pain Onset  More than a month ago    Pain Frequency  Constant    Aggravating Factors   walking incr leg pain    Pain Relieving Factors  heat meds stretch                       [facility] Adult PT Treatment/Exercise - 05/17/18 0001      Exercises   Exercises  Lumbar      Lumbar Exercises: Stretches   Prone on Elbows Stretch  1 rep;60 seconds    Prone on Elbows Stretch Limitations  no worse    Press Ups  5 reps;5 seconds    Press Ups Limitations   incr back pain mildly but leg pain did not change       Manual Therapy   Manual Therapy  Soft tissue mobilization    Joint Mobilization  PA to LT sacral rdge  with 3 deep breaths Gr 2-3 RT lateral nad central lumbar    Soft tissue mobilization  RT lumbar and upper glutes     Manual Traction  RT leg pull 2x 30 osscilations      RT gluteal release with IR/ER RT hip prone         PT Short Term Goals - 05/01/18 1330      PT SHORT TERM GOAL #1   Title  She will be indpendent with initial hEp     Time  2    Period  Weeks    Status  New      PT SHORT TERM GOAL #2   Title  She will report pain decr 20 % or mroe  with activity on feet.     Time  3    Period  Weeks    Status  New        PT Long Term Goals - 05/01/18 1330      PT LONG TERM GOAL #1   Title  She will be indpendent with all hEp issued    Time  6    Period  Weeks    Status  New      PT LONG TERM GOAL #2   Title  She will report pain no higher than 1-2 /10 with normal work and home tasks.     Time  6    Period  Weeks    Status  New      PT LONG TERM GOAL #3   Title  She will report able to walk without limp    Time  6    Period  Weeks    Status  New      PT LONG TERM GOAL #4   Title  FOTO will improve to 30% limited or less    Time  6    Period  Weeks    Status  New            Plan - 05/17/18 1240    Clinical Impression Statement  She reported feeling better post session. leg pain 1/10 or less and back decreased 1/2 .  We worked on use of SPC on LT to decr pressure to RT knee to decr pain with walking. She will look into buying a cane for home and work.   She will try pressups at home but stop if leg pain or back pain increases    PT Treatment/Interventions  Passive range of motion;Manual techniques;Patient/family education;Therapeutic exercise;Cryotherapy;Iontophoresis 4mg /ml Dexamethasone;Moist Heat;Therapeutic activities;Dry needling    PT Next Visit Plan  [facility] HEP , modalities and manual  for pain ,   add to HEP    PT Home Exercise Plan  Knee to chest knee to opposite shoulder with contract relax, Seated green band ER , supine clam, bridge with ball squeeze, pelvic tilts     Consulted and Agree with Plan of Care  Patient       Patient will benefit from skilled therapeutic intervention in order to improve the following deficits and impairments:  Pain, Postural dysfunction, Decreased strength, Decreased activity tolerance, Decreased range of motion, Increased muscle spasms  Visit Diagnosis: Right low back pain, unspecified chronicity, unspecified whether sciatica present  Muscle spasm of back  Abnormal posture     Problem List Patient Active Problem List   Diagnosis Date Noted  . Chronic pain of left knee 04/17/2018  . Right low back pain 04/17/2018  . Fatigue 08/21/2017  . Bradycardia 08/21/2017  . Atypical chest pain 08/21/2017  . Bronchitis 07/21/2016  . Cough 09/17/2015  . Anxiety 12/08/2013  . Obesity (BMI 30-39.9) 08/02/2013  . Rhinitis 08/02/2013  . Cervical stenosis of spine 04/26/2011  . Hyperlipidemia 06/05/2007  . Reactive airway disease 06/05/2007  . GERD 06/05/2007    Darrel Hoover  PT 05/17/2018, 1:34 PM  [facility] [address] [place], [place], [address] Phone: [phone]   Fax:  [phone]  Name: Dana Mccoy MRN: [id number] Date of Birth: 1955/04/08

## 2018-05-21 ENCOUNTER — Ambulatory Visit: Payer: 59

## 2018-05-21 DIAGNOSIS — M62830 Muscle spasm of back: Secondary | ICD-10-CM

## 2018-05-21 DIAGNOSIS — R293 Abnormal posture: Secondary | ICD-10-CM | POA: Diagnosis not present

## 2018-05-21 DIAGNOSIS — M545 Low back pain, unspecified: Secondary | ICD-10-CM

## 2018-05-21 NOTE — Therapy (Addendum)
[place], [place], [address] Phone: [phone]   Fax:  [phone]  Physical Therapy Treatment/discharge  Patient Details  Name: Savahna Casados MRN: [id number] Date of Birth: 06/06/1955 Referring Provider (PT): Joni Fears, MD   Encounter Date: 05/21/2018  PT End of Session - 05/21/18 1420    Visit Number  4    Number of Visits  12    Date for PT Re-Evaluation  06/08/18    Authorization Type  [employer]    PT Start Time  0217    PT Stop Time  0300    PT Time Calculation (min)  43 min    Activity Tolerance  Patient tolerated treatment well    Behavior During Therapy  [facility] for tasks assessed/performed       Past Medical History:  Diagnosis Date  . GERD (gastroesophageal reflux disease)   . Hyperlipidemia   . Hypertension   . Reactive airway disease    post CAP    Past Surgical History:  Procedure Laterality Date  . CARDIAC CATHETERIZATION  1997   negative  . COLONOSCOPY  2007   Dr Cristina Gong  . DILATION AND CURETTAGE OF UTERUS    . UPPER GI ENDOSCOPY     X 2  . WISDOM TOOTH EXTRACTION      There were no vitals filed for this visit.  Subjective Assessment - 05/21/18 1422    Subjective  BACK/LEG pain TODAY IS SAME.   managed to work full duty .   Can't stand on RT leg to dress.    Hard to get out of tub due to back pain    Pain Score  4     Pain Location  Back   and into RT posterior thigh   Pain Orientation  Lower   central   Pain Descriptors / Indicators  Aching;Burning    Pain Type  Chronic pain    Pain Onset  More than a month ago    Pain Frequency  Constant    Aggravating Factors   walking    Pain Relieving Factors  rest                       [facility] Adult PT Treatment/Exercise - 05/21/18 0001      Lumbar Exercises: Stretches   Single Knee to Chest Stretch  Right;30 seconds    Piriformis Stretch  Right;30 seconds    Figure 4 Stretch  2 reps;30 seconds       Lumbar Exercises: Quadruped   Madcat/Old Horse  15 reps    Straight Leg Raise  5 reps    Straight Leg Raises Limitations  RT/LT       Manual Therapy   Manual Therapy  Myofascial release    Joint Mobilization  PA to LT sacral rdge  with 3 deep breaths Gr 2-3 RT lateral nad central lumbar    Soft tissue mobilization  RT lumbar and upper glutes     Myofascial Release  RT glutes with RT hip rotation    Manual Traction  x 5 min RT      MHP while supine with exercise         PT Short Term Goals - 05/21/18 1454      PT SHORT TERM GOAL #1   Title  She will be indpendent with initial hEp     Status  Achieved      PT SHORT TERM GOAL #2  Title  She will report pain decr 20 % or mroe with activity on feet.     Status  On-going        PT Long Term Goals - 05/01/18 1330      PT LONG TERM GOAL #1   Title  She will be indpendent with all hEp issued    Time  6    Period  Weeks    Status  New      PT LONG TERM GOAL #2   Title  She will report pain no higher than 1-2 /10 with normal work and home tasks.     Time  6    Period  Weeks    Status  New      PT LONG TERM GOAL #3   Title  She will report able to walk without limp    Time  6    Period  Weeks    Status  New      PT LONG TERM GOAL #4   Title  FOTO will improve to 30% limited or less    Time  6    Period  Weeks    Status  New            Plan - 05/21/18 1420    Clinical Impression Statement  Did long sit with RT leg extended and with DF and flexed trunk did not elicite the same back pain she normally gets.     She did ell with stretching  and leg pulls  eased pain.     PT Treatment/Interventions  Passive range of motion;Manual techniques;Patient/family education;Therapeutic exercise;Cryotherapy;Iontophoresis 20m/ml Dexamethasone;Moist Heat;Therapeutic activities;Dry needling    PT Next Visit Plan  , modalities and manual for pain ,   add to HEP    PT Home Exercise Plan  Knee to chest knee to opposite  shoulder with contract relax, Seated green band ER , supine clam, bridge with ball squeeze, pelvic tilts     Consulted and Agree with Plan of Care  Patient       Patient will benefit from skilled therapeutic intervention in order to improve the following deficits and impairments:  Pain, Postural dysfunction, Decreased strength, Decreased activity tolerance, Decreased range of motion, Increased muscle spasms  Visit Diagnosis: Right low back pain, unspecified chronicity, unspecified whether sciatica present  Muscle spasm of back  Abnormal posture     Problem List Patient Active Problem List   Diagnosis Date Noted  . Chronic pain of left knee 04/17/2018  . Right low back pain 04/17/2018  . Fatigue 08/21/2017  . Bradycardia 08/21/2017  . Atypical chest pain 08/21/2017  . Bronchitis 07/21/2016  . Cough 09/17/2015  . Anxiety 12/08/2013  . Obesity (BMI 30-39.9) 08/02/2013  . Rhinitis 08/02/2013  . Cervical stenosis of spine 04/26/2011  . Hyperlipidemia 06/05/2007  . Reactive airway disease 06/05/2007  . GERD 06/05/2007    CDarrel Hoover PT 05/21/2018, 2:59 PM  [facility] [address]Phone: [phone]  Fax:  [phone] Name: LLavella MyrenMRN: [id number] of Birth: [birth year] PHYSICAL THERAPY DISCHARGE SUMMARY  Visits from Start of Care: 4  Current functional level related to goals / functional outcomes: Unknown but with last session with Dr WDurward Fortesit appeared she was doing better.   Remaining deficits: Unknown  Education / Equipment: HEP Plan: Patient agrees to discharge.  Patient goals were not met. Patient is being discharged due  to the physician's request.  ?????    Pearson Forster  PT 08/21/18

## 2018-05-22 ENCOUNTER — Ambulatory Visit: Payer: 59 | Admitting: Cardiology

## 2018-05-24 ENCOUNTER — Encounter (INDEPENDENT_AMBULATORY_CARE_PROVIDER_SITE_OTHER): Payer: Self-pay | Admitting: Orthopaedic Surgery

## 2018-05-24 ENCOUNTER — Ambulatory Visit: Payer: 59 | Admitting: Physical Therapy

## 2018-05-24 ENCOUNTER — Other Ambulatory Visit: Payer: Self-pay

## 2018-05-24 ENCOUNTER — Ambulatory Visit (INDEPENDENT_AMBULATORY_CARE_PROVIDER_SITE_OTHER): Payer: 59 | Admitting: Orthopaedic Surgery

## 2018-05-24 ENCOUNTER — Other Ambulatory Visit (INDEPENDENT_AMBULATORY_CARE_PROVIDER_SITE_OTHER): Payer: Self-pay | Admitting: Orthopaedic Surgery

## 2018-05-24 ENCOUNTER — Telehealth (INDEPENDENT_AMBULATORY_CARE_PROVIDER_SITE_OTHER): Payer: Self-pay | Admitting: Orthopaedic Surgery

## 2018-05-24 VITALS — BP 139/74 | HR 76 | Ht 65.0 in | Wt 194.0 lb

## 2018-05-24 DIAGNOSIS — M5441 Lumbago with sciatica, right side: Secondary | ICD-10-CM | POA: Diagnosis not present

## 2018-05-24 DIAGNOSIS — R1031 Right lower quadrant pain: Secondary | ICD-10-CM | POA: Diagnosis not present

## 2018-05-24 MED ORDER — TRAMADOL HCL 50 MG PO TABS: 50.0000 mg | ORAL_TABLET | Freq: Four times a day (QID) | ORAL | 0 refills | Status: DC | PRN

## 2018-05-24 MED FILL — traMADol HCL 50 MG TABS: 50 | 8 days supply | Qty: 30 | Fill #0

## 2018-05-24 NOTE — Telephone Encounter (Signed)
Patient called stating she has decided that she would like to get a prescription for Tramadol to be sent to [facility].

## 2018-05-24 NOTE — Telephone Encounter (Signed)
Please advise 

## 2018-05-24 NOTE — Telephone Encounter (Signed)
called

## 2018-05-24 NOTE — Addendum Note (Signed)
Addended by: Lendon Collar on: 05/24/2018 12:45 PM   Modules accepted: Orders

## 2018-05-24 NOTE — Progress Notes (Signed)
Office Visit Note   Patient: Dana Mccoy           Date of Birth: [birth year]           MRN: [id number] Visit Date: 05/24/2018              Requested by: Binnie Rail, MD [place], [place] [address] PCP: Binnie Rail, MD   Assessment & Plan: Visit Diagnoses:  1. Right-sided low back pain with right-sided sciatica, unspecified chronicity   2. Right groin pain     Plan:  #1: MRI scan lumbar spine rule out HNP #2: Given her a work note that limits walking at work.  Use of a cane with ambulation.  Limit work to 8 hours/day max.  Valet parking versus handicap parking also. #3 given a temporary handicap sticker application.  Follow-Up Instructions: Return for review of mri.   Orders:  No orders of the defined types were placed in this encounter.  No orders of the defined types were placed in this encounter.     Procedures: No procedures performed   Clinical Data: No additional findings.   Subjective: Chief Complaint  Patient presents with  . Right Hip - Follow-up  Patient presents today for follow up on her right hip pain. She said that her right hip is worsening and the pain radiates down her right lower extremity. She also feels that her right leg is weak and has been having difficulty with stairs. She has been going to therapy twice weekly since her last office visit in February of 2020. She is taking nothing for pain.  HPI  Dana Mccoy is seen today for evaluation of her right hip and right leg pain.  She states that as she is walking into the hospital or for long periods of time she starts to get the point where she has difficulty.  She has pain noted radiates down the right lower extremity.  Less in the groin at this time.  She bought a cane and actually had some benefit with that as she would ambulate.  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  Denies any neurovascular  symptoms.  She is done her physical therapy since her last office visit in February but is not necessarily had much in the way of improvement.  She does feel weak with the right leg and has difficulty with stair climbing.  Review of Systems  Constitutional: Positive for fatigue.  HENT: Negative for ear pain.   Eyes: Negative for redness.  Respiratory: Positive for cough.   Cardiovascular: Positive for leg swelling.  Genitourinary: Negative for difficulty urinating.  Musculoskeletal: Positive for joint swelling.  Skin: Negative for rash.  Allergic/Immunologic: Positive for food allergies.  Neurological: Negative for weakness.  Hematological: Bruises/bleeds easily.  Psychiatric/Behavioral: Positive for sleep disturbance. Negative for confusion.     Objective: Vital Signs: BP 139/74   Pulse 76   Ht 5\' 5"  (1.651 m)   Wt 194 lb (88 kg)   BMI 32.28 kg/m   Physical Exam Constitutional:      Appearance: She is well-developed.  Eyes:     Pupils: Pupils are equal, round, and reactive to light.  Pulmonary:     Effort: Pulmonary effort is normal.  Skin:    General: Skin is warm and dry.  Neurological:     Mental Status: She is alert and oriented to person,  place, and time.  Psychiatric:        Behavior: Behavior normal.     Ortho Exam  Today she has a negative straight leg raising.  Particularly painful about the trochanteric region of the right hip.  Deep tendon reflexes were brisk at 2-3+ bilateral symmetric.  Sensation was intact light touch.  Excellent strength in both lower extremities bilateral and equally strong.  Minimal pain with range of motion of the right hip.  Neurovascular intact distally.  Specialty Comments:  No specialty comments available.  Imaging: No results found.   PMFS History: Patient Active Problem List   Diagnosis Date Noted  . Chronic pain of left knee 04/17/2018  . Right low back pain 04/17/2018  . Fatigue 08/21/2017  . Bradycardia 08/21/2017   . Atypical chest pain 08/21/2017  . Bronchitis 07/21/2016  . Cough 09/17/2015  . Anxiety 12/08/2013  . Obesity (BMI 30-39.9) 08/02/2013  . Rhinitis 08/02/2013  . Cervical stenosis of spine 04/26/2011  . Hyperlipidemia 06/05/2007  . Reactive airway disease 06/05/2007  . GERD 06/05/2007   Past Medical History:  Diagnosis Date  . GERD (gastroesophageal reflux disease)   . Hyperlipidemia   . Hypertension   . Reactive airway disease    post CAP    Family History  Adopted: Yes    Past Surgical History:  Procedure Laterality Date  . CARDIAC CATHETERIZATION  1997   negative  . COLONOSCOPY  2007   Dr Dana Mccoy  . DILATION AND CURETTAGE OF UTERUS    . UPPER GI ENDOSCOPY     X 2  . WISDOM TOOTH EXTRACTION     Social History   Occupational History  . Not on file  Tobacco Use  . Smoking status: Former Smoker    Last attempt to quit: 03/14/1984    Years since quitting: 34.2  . Smokeless tobacco: Never Used  . Tobacco comment: smoked 1975-1986, up to 1.5 ppd  Substance and Sexual Activity  . Alcohol use: No  . Drug use: No  . Sexual activity: Not on file

## 2018-05-28 ENCOUNTER — Ambulatory Visit (INDEPENDENT_AMBULATORY_CARE_PROVIDER_SITE_OTHER): Payer: 59 | Admitting: Orthopaedic Surgery

## 2018-05-28 ENCOUNTER — Telehealth (INDEPENDENT_AMBULATORY_CARE_PROVIDER_SITE_OTHER): Payer: Self-pay | Admitting: Orthopaedic Surgery

## 2018-05-28 NOTE — Telephone Encounter (Signed)
Letter faxed, I called patient.

## 2018-05-28 NOTE — Telephone Encounter (Signed)
Ok for note 

## 2018-05-28 NOTE — Telephone Encounter (Signed)
Patient was out of work 3/11, 3/14, 3/15. Patient requesting a note to state this, and to return her to work 3/18 at 6 hour/ day. Patient request note to faxed to attn. Mingo Amber Fax# [phone].

## 2018-05-28 NOTE — Telephone Encounter (Signed)
Please advise 

## 2018-05-31 ENCOUNTER — Ambulatory Visit (HOSPITAL_BASED_OUTPATIENT_CLINIC_OR_DEPARTMENT_OTHER): Payer: 59 | Attending: Cardiology | Admitting: Cardiovascular Disease

## 2018-05-31 VITALS — Ht 65.0 in | Wt 196.0 lb

## 2018-05-31 DIAGNOSIS — Z79899 Other long term (current) drug therapy: Secondary | ICD-10-CM | POA: Insufficient documentation

## 2018-05-31 DIAGNOSIS — R400 Somnolence: Secondary | ICD-10-CM

## 2018-05-31 DIAGNOSIS — R0683 Snoring: Secondary | ICD-10-CM | POA: Diagnosis not present

## 2018-05-31 DIAGNOSIS — R0902 Hypoxemia: Secondary | ICD-10-CM | POA: Diagnosis not present

## 2018-05-31 DIAGNOSIS — G4761 Periodic limb movement disorder: Secondary | ICD-10-CM | POA: Diagnosis not present

## 2018-06-03 ENCOUNTER — Encounter (HOSPITAL_BASED_OUTPATIENT_CLINIC_OR_DEPARTMENT_OTHER): Payer: Self-pay | Admitting: Cardiovascular Disease

## 2018-06-03 NOTE — Procedures (Signed)
Patient Name: Dana Mccoy, Dana Mccoy Date: 05/31/2018 Gender: Female D.O.B: [birth year] Age (years): 63 Referring Provider: Buford Dresser Height (inches): 65 Interpreting Physician: Shelva Majestic MD, ABSM Weight (lbs): 196 RPSGT: Earney Hamburg BMI: 33 MRN: [id number] Neck Size: 15.00  CLINICAL INFORMATION Sleep Study Type: NPSG  Indication for sleep study: Fatigue  Epworth Sleepiness Score: 13  SLEEP STUDY TECHNIQUE As per the AASM Manual for the Scoring of Sleep and Associated Events v2.3 (April 2016) with a hypopnea requiring 4% desaturations.  The channels recorded and monitored were frontal, central and occipital EEG, electrooculogram (EOG), submentalis EMG (chin), nasal and oral airflow, thoracic and abdominal wall motion, anterior tibialis EMG, snore microphone, electrocardiogram, and pulse oximetry.  MEDICATIONS     ACYCLOVIR PO             albuterol (VENTOLIN HFA) 108 (90 Base) MCG/ACT inhaler         Ascorbic Acid (VITAMIN C PO)         Calcium Carb-Cholecalciferol (CALCIUM/VITAMIN D PO)         Cetirizine HCl (ZYRTEC ALLERGY) 10 MG CAPS         FLUoxetine (PROZAC) 20 MG tablet         fluticasone furoate-vilanterol (BREO ELLIPTA) 100-25 MCG/INH AEPB         hydrochlorothiazide (MICROZIDE) 12.5 MG capsule         mometasone (NASONEX) 50 MCG/ACT nasal spray         Multiple Vitamin (MULTIVITAMIN) tablet         Naproxen Sodium (ALEVE) 220 MG CAPS         omeprazole (PRILOSEC) 40 MG capsule         phenylephrine (SUDAFED PE) 10 MG TABS tablet         Probiotic Product (ALIGN PO)         traMADol (ULTRAM) 50 MG tablet      Medications self-administered by patient taken the night of the study : TRAZODONE  SLEEP ARCHITECTURE The study was initiated at 10:54:05 PM and ended at 4:56:15 AM.  Sleep onset time was 70.9 minutes and the sleep efficiency was 43.0%%. The total sleep time was 155.8 minutes.  Stage REM latency was 169.0 minutes.  The  patient spent 2.6%% of the night in stage N1 sleep, 84.9%% in stage N2 sleep, 0.0%% in stage N3 and 12.5% in REM.  Alpha intrusion was absent.  Supine sleep was 76.89%.  RESPIRATORY PARAMETERS The overall apnea/hypopnea index (AHI) was 0.8 per hour.  The respiratory disturbance index (RDI) was 2.7/h.  There were 0 total apneas, including 0 obstructive, 0 central and 0 mixed apneas. There were 2 hypopneas and 5 RERAs.  The AHI during Stage REM sleep was 0.0 per hour.  AHI while supine was 1.0 per hour.  The mean oxygen saturation was 92.3%. The minimum SpO2 during sleep was 89.0%.  Soft snoring was noted during this study.  CARDIAC DATA The 2 lead EKG demonstrated sinus rhythm. The mean heart rate was 62.6 beats per minute. Other EKG findings include: None. . LEG MOVEMENT DATA The total PLMS were 0 with a resulting PLMS index of 0.0; however, total limb movement index was 134.8.  Associated arousal with leg movement index was 33.1 .  IMPRESSIONS - No significant obstructive sleep apnea occurred during this study (AHI = 0.8/h). - No significant central sleep apnea occurred during this study (CAI = 0.0/h). - The patient had minimal or no oxygen desaturation during the study (Min  O2 = 89.0%) - The patient snored with soft snoring volume. - No cardiac abnormalities were noted during this study. - Clinically significant periodic limb movements did not occur during sleep. Associated arousals were significant.  DIAGNOSIS - Periodic Limb Movement During Sleep (327.51 [G47.61 ICD-10]) - Nocturnal Hypoxemia (327.26 [G47.36 ICD-10])  RECOMMENDATIONS - There is no evidence for significant sleep disordered breathing.  - Efforts should be made to optimize nasal and oropharyngeal patency. -  If patient continues to have significant daytime sleepiness, consider an multiple latency sleep test to assess for idiopathic hypersomnolence or narcolepsy. - If patient is symptomatic with restless legs  consider a trial of pharmocotherapy for treatment of Periodic Leg Movements of Sleep. - Avoid alcohol, sedatives and other CNS depressants that may worsen sleep apnea and disrupt normal sleep architecture. - Sleep hygiene should be reviewed to assess factors that may improve sleep quality. - Weight management (BMI 33) and regular exercise should be initiated or continued if appropriate.  [Electronically signed] 06/03/2018 12:00 PM  Shelva Majestic MD, [facility] [place], [place], American Board of Sleep Medicine   NPI: [id number] [place] PH: [phone]   FX: [phone] [place]

## 2018-06-05 ENCOUNTER — Ambulatory Visit
Admission: RE | Admit: 2018-06-05 | Discharge: 2018-06-05 | Disposition: A | Discharge status: Home / Self Care | Payer: 59 | Source: Ambulatory Visit | Attending: Orthopedic Surgery | Admitting: Orthopedic Surgery

## 2018-06-05 ENCOUNTER — Telehealth: Payer: Self-pay | Admitting: *Deleted

## 2018-06-05 ENCOUNTER — Other Ambulatory Visit: Payer: Self-pay

## 2018-06-05 DIAGNOSIS — M545 Low back pain: Secondary | ICD-10-CM | POA: Diagnosis not present

## 2018-06-05 DIAGNOSIS — M5441 Lumbago with sciatica, right side: Secondary | ICD-10-CM

## 2018-06-05 DIAGNOSIS — R1031 Right lower quadrant pain: Secondary | ICD-10-CM

## 2018-06-05 NOTE — Telephone Encounter (Signed)
Patient notified no significant sleep disordered breathing  seen on sleep study. Patient voiced verbal understanding.

## 2018-06-05 NOTE — Telephone Encounter (Signed)
-----   Message from Troy Sine, MD sent at 06/03/2018 12:06 PM EDT ----- Mariann Laster, please notify pt of the results.

## 2018-06-07 ENCOUNTER — Telehealth (INDEPENDENT_AMBULATORY_CARE_PROVIDER_SITE_OTHER): Payer: Self-pay | Admitting: Orthopaedic Surgery

## 2018-06-07 ENCOUNTER — Ambulatory Visit (INDEPENDENT_AMBULATORY_CARE_PROVIDER_SITE_OTHER): Payer: 59 | Admitting: Orthopaedic Surgery

## 2018-06-07 ENCOUNTER — Other Ambulatory Visit: Payer: Self-pay

## 2018-06-07 ENCOUNTER — Encounter (INDEPENDENT_AMBULATORY_CARE_PROVIDER_SITE_OTHER): Payer: Self-pay | Admitting: Orthopaedic Surgery

## 2018-06-07 VITALS — BP 142/76 | HR 80 | Ht 65.0 in | Wt 194.0 lb

## 2018-06-07 DIAGNOSIS — M5441 Lumbago with sciatica, right side: Secondary | ICD-10-CM | POA: Diagnosis not present

## 2018-06-07 NOTE — Telephone Encounter (Signed)
Patient called stating [employer] will be sending paperwork with the dates that she missed work for Dr. Rudene Anda signature.  Patient states the dates should include 3/11, 3/14, 3/15, 3/18, 3/21, 3/22, 3/25, 3/26.

## 2018-06-07 NOTE — Progress Notes (Signed)
Office Visit Note   Patient: Dana Mccoy           Date of Birth: 05/17/1955           MRN: [id number] Visit Date: 06/07/2018              Requested by: Binnie Rail, MD [place], [place] [address] PCP: Binnie Rail, MD   Assessment & Plan: Visit Diagnoses:  1. Right-sided low back pain with right-sided sciatica, unspecified chronicity     Plan: MRI scan of lumbar spine revealed mild for age lumbar spine degeneration without spinal stenosis or right-sided neural impingement.  Borderline to mild multifactorial left L3 neural foraminal stenosis.  Presently having more back than leg pain but is unable to work because of discomfort and weakness of her right leg.  Pelvis films were essentially negative but I am concerned she might have some arthritis in her right hip that I do not see on her plain films.  This certainly could create pain and secondarily weakness.  I will order MRI of her pelvis.  She is been trying to work but is so uncomfortable that we will just plan to keep her out of work until we can determine the cause of her pain.  She has had a course of physical therapy for her back and not sure it made much of a difference.  She will continue with over-the-counter medicines and tramadol as needed.  No symptoms referable to her left lower extremity.  Not diabetic.  Does have mild arthritis of the right knee but pain is not localized to that joint.  Proceed with the MRI of her right hip and then also consider EMGs and nerve conduction studies related to her weakness Follow-Up Instructions: No follow-ups on file.   Orders:  No orders of the defined types were placed in this encounter.  No orders of the defined types were placed in this encounter.     Procedures: No procedures performed   Clinical Data: No additional findings.   Subjective: Chief Complaint  Patient presents with  . Lower Back - Follow-up  Patient presents today for right sided lower  back pain. She was last here two weeks ago. She had an MRI on 06/05/18 and is here for results today. Patient states that her back is not doing well. She takes tramadol at night. MRI scan of lumbar spine was negative for any pathology on the right.  Does have some possible L3 foraminal stenosis on the left but no symptoms referable to the extremity.  Does have some back pain.  Still having some groin pain but with negative films of her pelvis and specifically her right hip. Has tried working as little as 6 hours a day but still having pain  HPI  Review of Systems  Constitutional: Negative for fatigue.  HENT: Negative for ear pain.   Eyes: Negative for pain.  Respiratory: Negative for shortness of breath.   Cardiovascular: Negative for leg swelling.  Gastrointestinal: Negative for constipation and diarrhea.  Endocrine: Positive for cold intolerance. Negative for heat intolerance.  Genitourinary: Negative for difficulty urinating.  Musculoskeletal: Negative for joint swelling.  Skin: Negative for rash.  Allergic/Immunologic: Negative for food allergies.  Neurological: Positive for weakness.  Hematological: Does not bruise/bleed easily.  Psychiatric/Behavioral: Positive for sleep disturbance.     Objective: Vital Signs: BP (!) 142/76   Pulse 80   Ht 5\' 5"  (1.651 m)   Wt 194 lb (88  kg)   BMI 32.28 kg/m   Physical Exam Constitutional:      Appearance: She is well-developed.  Eyes:     Pupils: Pupils are equal, round, and reactive to light.  Pulmonary:     Effort: Pulmonary effort is normal.  Skin:    General: Skin is warm and dry.  Neurological:     Mental Status: She is alert and oriented to person, place, and time.  Psychiatric:        Behavior: Behavior normal.     Ortho Exam awake alert and oriented x3.  Comfortable sitting.  No distress.  Straight leg raise is negative bilaterally.  Has a little loss of internal/external rotation on the extremes of the right hip  compared to her left.  Muscle strength appears to be equal and normal bilaterally.  Reflexes were brisk  and symmetric.  Sensory exam intact.  Mild percussible tenderness of the lumbar spine  Specialty Comments:  No specialty comments available.  Imaging: No results found.   PMFS History: Patient Active Problem List   Diagnosis Date Noted  . Chronic pain of left knee 04/17/2018  . Right low back pain 04/17/2018  . Fatigue 08/21/2017  . Bradycardia 08/21/2017  . Atypical chest pain 08/21/2017  . Bronchitis 07/21/2016  . Cough 09/17/2015  . Anxiety 12/08/2013  . Obesity (BMI 30-39.9) 08/02/2013  . Rhinitis 08/02/2013  . Cervical stenosis of spine 04/26/2011  . Hyperlipidemia 06/05/2007  . Reactive airway disease 06/05/2007  . GERD 06/05/2007   Past Medical History:  Diagnosis Date  . GERD (gastroesophageal reflux disease)   . Hyperlipidemia   . Hypertension   . Reactive airway disease    post CAP    Family History  Adopted: Yes    Past Surgical History:  Procedure Laterality Date  . CARDIAC CATHETERIZATION  1997   negative  . COLONOSCOPY  2007   Dr Cristina Gong  . DILATION AND CURETTAGE OF UTERUS    . UPPER GI ENDOSCOPY     X 2  . WISDOM TOOTH EXTRACTION     Social History   Occupational History  . Not on file  Tobacco Use  . Smoking status: Former Smoker    Last attempt to quit: 03/14/1984    Years since quitting: 34.2  . Smokeless tobacco: Never Used  . Tobacco comment: smoked 1975-1986, up to 1.5 ppd  Substance and Sexual Activity  . Alcohol use: No  . Drug use: No  . Sexual activity: Not on file

## 2018-06-08 ENCOUNTER — Ambulatory Visit (INDEPENDENT_AMBULATORY_CARE_PROVIDER_SITE_OTHER): Payer: 59 | Admitting: Orthopaedic Surgery

## 2018-06-08 ENCOUNTER — Telehealth: Payer: Self-pay

## 2018-06-08 NOTE — Telephone Encounter (Signed)
TELEPHONE CALL NOTE  Alla Sloma has been deemed a candidate for a follow-up tele-health visit to limit community exposure during the Covid-19 pandemic. I spoke with the patient via phone to ensure availability of phone/video source, confirm preferred email & phone number, and discuss instructions and expectations.  I reminded Aloura Matsuoka Blancett to be prepared with any vital sign and/or heart rhythm information that could potentially be obtained via home monitoring, at the time of her visit. I reminded Yolani Vo Grover to expect a phone call at the time of her visit if her visit.  Did the patient verbally acknowledge consent to treatment? yes  Meryl Crutch, RN 06/08/2018 3:06 PM   DOWNLOADING THE [place] TO SMARTPHONE  - If Apple, go to [employer] and type in WebEx in the search bar. Louise [employer], the blue/green circle. The app is free but as with any other app downloads, their phone may require them to verify saved payment information or Apple password. The patient does NOT have to create an account.  - If Android, ask patient to go to [employer] and type in WebEx in the search bar. Lynn [employer], the blue/green circle. The app is free but as with any other app downloads, their phone may require them to verify saved payment information or Android password. The patient does NOT have to create an account.   CONSENT FOR TELE-HEALTH VISIT - PLEASE REVIEW  I hereby voluntarily request, consent and authorize [facility] and its employed or contracted physicians, physician assistants, nurse practitioners or other licensed health care professionals (the Practitioner), to provide me with telemedicine health care services (the "Services") as deemed necessary by the treating Practitioner. I acknowledge and consent to receive the Services by the Practitioner via telemedicine. I understand that the telemedicine visit will involve  communicating with the Practitioner through live audiovisual communication technology and the disclosure of certain medical information by electronic transmission. I acknowledge that I have been given the opportunity to request an in-person assessment or other available alternative prior to the telemedicine visit and am voluntarily participating in the telemedicine visit.  I understand that I have the right to withhold or withdraw my consent to the use of telemedicine in the course of my care at any time, without affecting my right to future care or treatment, and that the Practitioner or I may terminate the telemedicine visit at any time. I understand that I have the right to inspect all information obtained and/or recorded in the course of the telemedicine visit and may receive copies of available information for a reasonable fee.  I understand that some of the potential risks of receiving the Services via telemedicine include:  Marland Kitchen Delay or interruption in medical evaluation due to technological equipment failure or disruption; . Information transmitted may not be sufficient (e.g. poor resolution of images) to allow for appropriate medical decision making by the Practitioner; and/or  . In rare instances, security protocols could fail, causing a breach of personal health information.  Furthermore, I acknowledge that it is my responsibility to provide information about my medical history, conditions and care that is complete and accurate to the best of my ability. I acknowledge that Practitioner's advice, recommendations, and/or decision may be based on factors not within their control, such as incomplete or inaccurate data provided by me or distortions of diagnostic images or specimens that may result from electronic transmissions. I understand that the practice of medicine is not an exact  science and that Practitioner makes no warranties or guarantees regarding treatment outcomes. I acknowledge that I will  receive a copy of this consent concurrently upon execution via email to the email address I last provided but may also request a printed copy by calling the office of [facility].    I understand that my insurance will be billed for this visit.   I have read or had this consent read to me. . I understand the contents of this consent, which adequately explains the benefits and risks of the Services being provided via telemedicine.  . I have been provided ample opportunity to ask questions regarding this consent and the Services and have had my questions answered to my satisfaction. . I give my informed consent for the services to be provided through the use of telemedicine in my medical care  By participating in this telemedicine visit I agree to the above.

## 2018-06-12 ENCOUNTER — Other Ambulatory Visit: Payer: Self-pay

## 2018-06-12 ENCOUNTER — Ambulatory Visit: Payer: 59 | Admitting: Cardiology

## 2018-06-12 ENCOUNTER — Ambulatory Visit
Admission: RE | Admit: 2018-06-12 | Discharge: 2018-06-12 | Disposition: A | Discharge status: Home / Self Care | Payer: 59 | Source: Ambulatory Visit | Attending: Orthopaedic Surgery | Admitting: Orthopaedic Surgery

## 2018-06-12 DIAGNOSIS — M5441 Lumbago with sciatica, right side: Secondary | ICD-10-CM

## 2018-06-12 DIAGNOSIS — S76312A Strain of muscle, fascia and tendon of the posterior muscle group at thigh level, left thigh, initial encounter: Secondary | ICD-10-CM | POA: Diagnosis not present

## 2018-06-13 ENCOUNTER — Encounter: Payer: Self-pay | Admitting: Cardiology

## 2018-06-13 ENCOUNTER — Telehealth (INDEPENDENT_AMBULATORY_CARE_PROVIDER_SITE_OTHER): Payer: 59 | Admitting: Cardiology

## 2018-06-13 VITALS — Ht 65.0 in | Wt 195.0 lb

## 2018-06-13 DIAGNOSIS — R5383 Other fatigue: Secondary | ICD-10-CM | POA: Diagnosis not present

## 2018-06-13 DIAGNOSIS — E785 Hyperlipidemia, unspecified: Secondary | ICD-10-CM

## 2018-06-13 DIAGNOSIS — I10 Essential (primary) hypertension: Secondary | ICD-10-CM

## 2018-06-13 NOTE — Patient Instructions (Signed)
Medication Instructions:  Your Physician recommend you continue on your current medication as directed.    If you need a refill on your cardiac medications before your next appointment, please call your pharmacy.   Lab work: None   Testing/Procedures: None  Follow-Up: Your physician recommends that you schedule a follow-up appointment in 4-5 months with Dr. Harrell Gave.

## 2018-06-13 NOTE — Progress Notes (Signed)
Virtual Visit via Telephone Note    Evaluation Performed:  Follow-up visit  This visit type was conducted due to national recommendations for restrictions regarding the COVID-19 Pandemic (e.g. social distancing).  This format is felt to be most appropriate for this patient at this time.  All issues noted in this document were discussed and addressed.  No physical exam was performed (except for noted visual exam findings with Video Visits).  Please refer to the patient's chart (MyChart message for video visits and phone note for telephone visits) for the patient's consent to telehealth for [facility]  Date:  06/13/2018   ID:  Dana Mccoy, DOB 1955/06/30, MRN [id number]  Patient Location:  [place], [place] [address]  Provider location:   Remote access with base in [place], [place] Office   PCP:  Binnie Rail, MD  Cardiologist:  Buford Dresser, MD   Chief Complaint:  Follow up  History of Present Illness:    Dana Mccoy is a 63 y.o. female who presents via audio/video conferencing for a telehealth visit today.   The patient does not have symptoms concerning for COVID-19 infection (fever, chills, cough, or new shortness of breath).    Patient concerns: overall doing well from CV aspect. Having issues with hips/back, some numbness and weakness in legs. Working with orthopedics. Has caused her to be off work. She notes that her blood pressure was mildly elevated at last ortho visit, but notes that she was in pain at the time. No home cuff for home measurements.  Fatigue: a little different than before, has eased up. Not working currently due to hip/back issues. Feels a bit better, able to get a nap. Sleep study normal per her report (I cannot see final results).   Has rare chest discomfort, similar to prior. Reviewed stress test results. No associated symptoms, not worse than prior.  Discussed lipid results  again. Given that she is currently having MSK issues, will revisit statins at follow up.    Prior CV studies:   The following studies were reviewed today: Stress test, monitor results, records.  Past Medical History:  Diagnosis Date  . GERD (gastroesophageal reflux disease)   . Hyperlipidemia   . Hypertension   . Reactive airway disease    post CAP   Past Surgical History:  Procedure Laterality Date  . CARDIAC CATHETERIZATION  1997   negative  . COLONOSCOPY  2007   Dr Cristina Gong  . DILATION AND CURETTAGE OF UTERUS    . UPPER GI ENDOSCOPY     X 2  . WISDOM TOOTH EXTRACTION       Current Meds  Medication Sig  . ACYCLOVIR PO Take 500 mg by mouth daily. Patient states she has a 500mg  tab (not an option in epic), filled by another MD  . albuterol (VENTOLIN HFA) 108 (90 Base) MCG/ACT inhaler INHALE 2 PUFFS EVERY 4 TO 6 HOURS AS NEEDED FOR COUGH OR WHEEZE *MAY USE 10-20 MIN PRIOR TO EXERCISE  . Ascorbic Acid (VITAMIN C PO) Take 1 tablet by mouth daily. 1000mg   . Calcium Carb-Cholecalciferol (CALCIUM/VITAMIN D PO) Take by mouth daily.  . Cetirizine HCl (ZYRTEC ALLERGY) 10 MG CAPS Take 10 mg by mouth.  Marland Kitchen FLUoxetine (PROZAC) 20 MG tablet Take 20 mg by mouth daily.  . fluticasone furoate-vilanterol (BREO ELLIPTA) 100-25 MCG/INH AEPB Inhale 1 puff by mouth daily as needed. Need office visit for more refills.  . hydrochlorothiazide (MICROZIDE) 12.5 MG  capsule Take 1 capsule (12.5 mg total) by mouth daily. -- Office visit needed for further refills  . mometasone (NASONEX) 50 MCG/ACT nasal spray Nasonex 50 mcg/actuation Spray  . Multiple Vitamin (MULTIVITAMIN) tablet Take 1 tablet by mouth daily.  Marland Kitchen omeprazole (PRILOSEC) 40 MG capsule Take 40 mg by mouth daily.  . phenylephrine (SUDAFED PE) 10 MG TABS tablet Take 10 mg by mouth as needed.  . Probiotic Product (ALIGN PO) Take by mouth daily.  . traMADol (ULTRAM) 50 MG tablet Take 1 tablet (50 mg total) by mouth every 6 (six) hours as needed.      Allergies:   Penicillins and Lipitor [atorvastatin]   Social History   Tobacco Use  . Smoking status: Former Smoker    Last attempt to quit: 03/14/1984    Years since quitting: 34.2  . Smokeless tobacco: Never Used  . Tobacco comment: smoked 1975-1986, up to 1.5 ppd  Substance Use Topics  . Alcohol use: No  . Drug use: No     Family Hx: The patient's family history is not on file. She was adopted.  ROS:   Please see the history of present illness.    All other systems reviewed and are negative.   Labs/Other Tests and Data Reviewed:    Recent Labs: 08/21/2017: ALT 19; BUN 20; Creatinine, Ser 0.74; Hemoglobin 13.6; Platelets 313.0; Potassium 3.7; Sodium 139; TSH 4.07   Recent Lipid Panel Lab Results  Component Value Date/Time   CHOL 231 (H) 04/04/2018 10:10 AM   CHOL 260 (H) 09/05/2014 10:26 AM   TRIG 195 (H) 04/04/2018 10:10 AM   TRIG 161 (H) 09/05/2014 10:26 AM   HDL 55 04/04/2018 10:10 AM   HDL 59 09/05/2014 10:26 AM   CHOLHDL 4.2 04/04/2018 10:10 AM   CHOLHDL 5 11/16/2016 03:49 PM   LDLCALC 137 (H) 04/04/2018 10:10 AM   LDLCALC 169 (H) 09/05/2014 10:26 AM   LDLDIRECT 137.0 11/16/2016 03:49 PM    Wt Readings from Last 3 Encounters:  06/13/18 195 lb (88.5 kg)  06/07/18 194 lb (88 kg)  05/31/18 196 lb (88.9 kg)     Objective:    Vital Signs:  Ht 5\' 5"  (1.651 m)   Wt 195 lb (88.5 kg)   BMI 32.45 kg/m     ASSESSMENT & PLAN:    1.  Fatigue: reported normal stress test, unremarkable workup. Better being off work, being able to nap. Continue to monitor  HTN with recent Elevated BP: in pain at recent visit. Monitor at follow up.  Hyperlipidemia: would like to discuss statin given elevated LDL and TG, but with her current MSK issues, would be difficult to determine if worsening symptoms are from medication or progression of her MSK condition. Will address at follow up.  COVID-19 Education: The signs and symptoms of COVID-19 were discussed with the patient  and how to seek care for testing (follow up with PCP or arrange E-visit).  The importance of social distancing was discussed today.  Patient Risk:   After full review of this patient's clinical status, I feel that they are at least moderate risk at this time.  Time:   Today, I have spent 13 minutes with the patient with telehealth technology discussing fatigue, HTN, HLD.    Patient Instructions  Medication Instructions:  Your Physician recommend you continue on your current medication as directed.    If you need a refill on your cardiac medications before your next appointment, please call your pharmacy.   Lab  work: None   Testing/Procedures: None  Follow-Up: Your physician recommends that you schedule a follow-up appointment in 4-5 months with Dr. Harrell Gave.     Medication Adjustments/Labs and Tests Ordered: Current medicines are reviewed at length with the patient today.  Concerns regarding medicines are outlined above.  Tests Ordered: No orders of the defined types were placed in this encounter.  Medication Changes: No orders of the defined types were placed in this encounter.   Disposition:  Follow up 4-5 months  Signed, Buford Dresser, MD  06/13/2018 11:03 AM    [facility]

## 2018-06-15 ENCOUNTER — Encounter (INDEPENDENT_AMBULATORY_CARE_PROVIDER_SITE_OTHER): Payer: Self-pay | Admitting: Orthopaedic Surgery

## 2018-06-15 ENCOUNTER — Other Ambulatory Visit: Payer: Self-pay | Admitting: Internal Medicine

## 2018-06-15 ENCOUNTER — Ambulatory Visit (INDEPENDENT_AMBULATORY_CARE_PROVIDER_SITE_OTHER): Payer: 59 | Admitting: Orthopaedic Surgery

## 2018-06-15 ENCOUNTER — Other Ambulatory Visit: Payer: Self-pay

## 2018-06-15 VITALS — BP 126/71 | HR 76 | Ht 65.0 in | Wt 194.0 lb

## 2018-06-15 DIAGNOSIS — M25551 Pain in right hip: Secondary | ICD-10-CM

## 2018-06-15 DIAGNOSIS — M5441 Lumbago with sciatica, right side: Secondary | ICD-10-CM | POA: Diagnosis not present

## 2018-06-15 MED ORDER — METHOCARBAMOL 500 MG PO TABS: ORAL_TABLET | ORAL | 0 refills | Status: DC

## 2018-06-15 MED FILL — METHOCARBAMOL 500 MG TABLET: 500 | 30 days supply | Qty: 30 | Fill #0

## 2018-06-15 MED FILL — HYDROCHLOROTHIAZIDE 12.5 MG: 12.5 | 30 days supply | Qty: 30 | Fill #0

## 2018-06-15 MED FILL — VALACYCLOVIR HCL 500 MG TAB: 500 | 90 days supply | Qty: 90 | Fill #0

## 2018-06-15 MED FILL — FLUoxetine HCL 20 MG CAPS: 20 | 90 days supply | Qty: 90 | Fill #0

## 2018-06-15 MED FILL — BREO ELLIPTA 100-25 MCG INH: 100-25 | 30 days supply | Qty: 60 | Fill #0

## 2018-06-15 NOTE — Progress Notes (Signed)
Office Visit Note   Patient: Dana Mccoy           Date of Birth: Jan 01, 1956           MRN: [id number] Visit Date: 06/15/2018              Requested by: Binnie Rail, MD [place], [place] [address] PCP: Binnie Rail, MD   Assessment & Plan: Visit Diagnoses:  1. Right-sided low back pain with right-sided sciatica, unspecified chronicity   2. Pain of right hip joint     Plan: MRI scan of pelvis demonstrates tendinosis and partial tearing of the right gluteus medius tendon.  Bilateral gluteus minimus tendinosis.  Right greater trochanteric bursitis and a suspected small tear of the right superior labrum.  Pain seems to be a combination of factors including low back pain and right hip pain.  Presently feels a little bit better.  Takes tramadol at night.  Will add Robaxin.  No work.  Return in 3 weeks.  Consider low back injections.  Also consider localized bursitis injections at some point in the future  Follow-Up Instructions: Return in about 1 month (around 07/15/2018).   Orders:  No orders of the defined types were placed in this encounter.  Meds ordered this encounter  Medications  . methocarbamol (ROBAXIN) 500 MG tablet    Sig: 1 tablet at bedtime    Dispense:  30 tablet    Refill:  0      Procedures: No procedures performed   Clinical Data: No additional findings.   Subjective: Chief Complaint  Patient presents with  . Lower Back - Follow-up  Patient presents today for follow up on her lower back pain. She had an MRI on 06/12/18. She is here today for results. Patient states that her back is feeling about the same.  Pain may be a little better.  Able to walk further distances with less pain.  Pain seems to migrate from her back to her right hip.  Presently having more trouble with her back.  MRI scan lumbar spine revealed mild lumbar degeneration with no stenosis.  There was no right-sided neural impingement.  There was borderline left L3  neural foraminal stenosis but no symptoms on the left  HPI  Review of Systems   Objective: Vital Signs: BP 126/71   Pulse 76   Ht 5\' 5"  (1.651 m)   Wt 194 lb (88 kg)   BMI 32.28 kg/m   Physical Exam Constitutional:      Appearance: She is well-developed.  Eyes:     Pupils: Pupils are equal, round, and reactive to light.  Pulmonary:     Effort: Pulmonary effort is normal.  Skin:    General: Skin is warm and dry.  Neurological:     Mental Status: She is alert and oriented to person, place, and time.  Psychiatric:        Behavior: Behavior normal.     Ortho Exam awake alert and oriented x3.  Walks without a limp.  No ambulatory aid.  Some mild percussible tenderness of the lumbar spine.  No pain over the greater trochanter right hip or in the right buttock painless range of motion right hip no swelling distally.  Neurologically intact  Specialty Comments:  No specialty comments available.  Imaging: No results found.   PMFS History: Patient Active Problem List   Diagnosis Date Noted  . Chronic pain of left knee 04/17/2018  . Right low  back pain 04/17/2018  . Fatigue 08/21/2017  . Bradycardia 08/21/2017  . Atypical chest pain 08/21/2017  . Bronchitis 07/21/2016  . Cough 09/17/2015  . Anxiety 12/08/2013  . Obesity (BMI 30-39.9) 08/02/2013  . Rhinitis 08/02/2013  . Cervical stenosis of spine 04/26/2011  . Hyperlipidemia 06/05/2007  . Reactive airway disease 06/05/2007  . GERD 06/05/2007   Past Medical History:  Diagnosis Date  . GERD (gastroesophageal reflux disease)   . Hyperlipidemia   . Hypertension   . Reactive airway disease    post CAP    Family History  Adopted: Yes    Past Surgical History:  Procedure Laterality Date  . CARDIAC CATHETERIZATION  1997   negative  . COLONOSCOPY  2007   Dr Cristina Gong  . DILATION AND CURETTAGE OF UTERUS    . UPPER GI ENDOSCOPY     X 2  . WISDOM TOOTH EXTRACTION     Social History   Occupational History  .  Not on file  Tobacco Use  . Smoking status: Former Smoker    Last attempt to quit: 03/14/1984    Years since quitting: 34.2  . Smokeless tobacco: Never Used  . Tobacco comment: smoked 1975-1986, up to 1.5 ppd  Substance and Sexual Activity  . Alcohol use: No  . Drug use: No  . Sexual activity: Not on file

## 2018-07-10 ENCOUNTER — Other Ambulatory Visit: Payer: Self-pay

## 2018-07-10 ENCOUNTER — Encounter (INDEPENDENT_AMBULATORY_CARE_PROVIDER_SITE_OTHER): Payer: Self-pay | Admitting: Orthopaedic Surgery

## 2018-07-10 ENCOUNTER — Ambulatory Visit (INDEPENDENT_AMBULATORY_CARE_PROVIDER_SITE_OTHER): Payer: 59 | Admitting: Orthopaedic Surgery

## 2018-07-10 VITALS — BP 127/78 | HR 73 | Ht 65.0 in | Wt 196.0 lb

## 2018-07-10 DIAGNOSIS — M5387 Other specified dorsopathies, lumbosacral region: Secondary | ICD-10-CM

## 2018-07-10 DIAGNOSIS — M25551 Pain in right hip: Secondary | ICD-10-CM

## 2018-07-10 MED ORDER — DICLOFENAC SODIUM 1 % TD GEL: 2.0000 g | Freq: Four times a day (QID) | TRANSDERMAL | 1 refills | Status: DC

## 2018-07-10 MED FILL — DICLOFENAC SODIUM 1 % GEL: 1 | 31 days supply | Qty: 500 | Fill #0

## 2018-07-10 NOTE — Progress Notes (Signed)
Office Visit Note   Patient: Dana Mccoy           Date of Birth: Jul 28, 1955           MRN: [id number] Visit Date: 07/10/2018              Requested by: Binnie Rail, MD [place], [place] [address] PCP: Binnie Rail, MD   Assessment & Plan: Visit Diagnoses:  1. Sciatica of right side associated with disorder of lumbosacral spine   2. Pain of right hip joint     Plan:  #1: Continue activities as tolerated. #2: Dr. Durward Fortes has filled out appropriate paperwork for her at this time after a very long discussion of her problem and her limitations with work. #3:  We are going to let her return to work 8 hours a day max 4 days a week max.  Limited ambulation. #4: Follow back up 6 weeks  Follow-Up Instructions: Return in about 6 weeks (around 08/21/2018).   Orders:  No orders of the defined types were placed in this encounter.  Meds ordered this encounter  Medications  . diclofenac sodium (VOLTAREN) 1 % GEL    Sig: Apply 2-4 g topically 4 (four) times daily.    Dispense:  5 Tube    Refill:  1    Order Specific Question:   Supervising Provider    Answer:   Garald Balding [[id number]]      Procedures: No procedures performed   Clinical Data: No additional findings.   Subjective: Chief Complaint  Patient presents with  . Lower Back - Follow-up  . Right Hip - Follow-up   HPI: Patient presents today for follow up on her right hip and lower back pain. She has been trying Robaxin and Tramadol as needed. Patient states that she is doing much better. She does not have constant pain anymore, but does have pain with any prolonged activity.  She finds that when she walks long distance and her symptoms return.  However she is able to do short distance type work with minimal symptoms of pain.  States she certainly is improved except for whenever she walks for prolonged periods.   Review of Systems  Constitutional: Negative for fatigue.  HENT: Negative  for ear pain.   Eyes: Negative for pain.  Respiratory: Negative for shortness of breath.   Cardiovascular: Negative for leg swelling.  Gastrointestinal: Negative for constipation and diarrhea.  Endocrine: Negative for cold intolerance and heat intolerance.  Genitourinary: Negative for difficulty urinating.  Musculoskeletal: Negative for joint swelling.  Skin: Negative for rash.  Allergic/Immunologic: Negative for food allergies.  Neurological: Positive for weakness.  Hematological: Does not bruise/bleed easily.  Psychiatric/Behavioral: Negative for sleep disturbance.     Objective: Vital Signs: BP 127/78   Pulse 73   Ht 5\' 5"  (1.651 m)   Wt 196 lb (88.9 kg)   BMI 32.62 kg/m   Physical Exam Constitutional:      Appearance: She is well-developed.  Eyes:     Pupils: Pupils are equal, round, and reactive to light.  Pulmonary:     Effort: Pulmonary effort is normal.  Skin:    General: Skin is warm and dry.  Neurological:     Mental Status: She is alert and oriented to person, place, and time.  Psychiatric:        Behavior: Behavior normal.     Ortho Exam  Exam today reveals the patient to be comfortable in  a sitting position on the table.  Deep tendon reflexes were 2+ bilateral symmetric in the lower extremities.  She has negative straight leg raising bilaterally.  Sensation is intact to light touch bilateral and equal.  Excellent strength bilateral lower extremities.  Smooth motion of both hips without crepitance or pain.   Specialty Comments:  No specialty comments available.  Imaging: Mr Pelvis W/o Contrast  Result Date: 06/12/2018 CLINICAL DATA:  Chronic right leg pain and numbness. EXAM: MRI PELVIS WITHOUT CONTRAST TECHNIQUE: Multiplanar multisequence MR imaging of the pelvis was performed. No intravenous contrast was administered. COMPARISON:  Pelvic x-ray dated April 17, 2018. FINDINGS: Bones: There is no evidence of acute fracture, dislocation or avascular  necrosis. No focal bone lesion. The visualized sacroiliac joints and symphysis pubis appear normal. Articular cartilage and labrum Articular cartilage: No focal chondral defect or subchondral signal abnormality identified. Labrum: Suspected small tear of the right superior labrum. Joint or bursal effusion Joint effusion: No significant hip joint effusion. Bursae: Small to moderate amount of fluid in the right greater trochanteric bursa. Trace fluid in the left greater trochanteric bursa. Muscles and tendons Muscles and tendons: Bilateral gluteus minimus tendinosis. Tendinosis and partial tearing of the right gluteus medius tendon with reactive marrow edema in the posterior right greater trochanter. The iliopsoas and hamstring tendons are unremarkable. No muscle edema or atrophy. Other findings Miscellaneous: 1.1 cm intramural fibroid in the left aspect of the uterine fundus. 2.2 cm right Bartholin gland cyst. Mild sigmoid diverticulosis. IMPRESSION: 1. Tendinosis and partial tearing of the right gluteus medius tendon. Bilateral gluteus minimus tendinosis. 2. Right greater trochanteric bursitis. 3. Suspected small tear of the right superior labrum. Electronically Signed   By: Titus Dubin M.D.   On: 06/12/2018 12:06     PMFS History: Current Outpatient Medications  Medication Sig Dispense Refill  . ACYCLOVIR PO Take 500 mg by mouth daily. Patient states she has a 500mg  tab (not an option in epic), filled by another MD    . albuterol (VENTOLIN HFA) 108 (90 Base) MCG/ACT inhaler INHALE 2 PUFFS EVERY 4 TO 6 HOURS AS NEEDED FOR COUGH OR WHEEZE *MAY USE 10-20 MIN PRIOR TO EXERCISE 18 g 3  . Ascorbic Acid (VITAMIN C PO) Take 1 tablet by mouth daily. 1000mg     . BREO ELLIPTA 100-25 MCG/INH AEPB INHALE 1 PUFF BY MOUTH DAILY AS NEEDED. 60 each 0  . Calcium Carb-Cholecalciferol (CALCIUM/VITAMIN D PO) Take by mouth daily.    . Cetirizine HCl (ZYRTEC ALLERGY) 10 MG CAPS Take 10 mg by mouth.    Marland Kitchen FLUoxetine  (PROZAC) 20 MG tablet Take 20 mg by mouth daily.    . hydrochlorothiazide (MICROZIDE) 12.5 MG capsule TAKE 1 CAPSULE BY MOUTH DAILY. -- OFFICE VISIT NEEDED FOR FURTHER REFILLS 30 capsule 0  . methocarbamol (ROBAXIN) 500 MG tablet 1 tablet at bedtime 30 tablet 0  . mometasone (NASONEX) 50 MCG/ACT nasal spray Nasonex 50 mcg/actuation Spray    . Multiple Vitamin (MULTIVITAMIN) tablet Take 1 tablet by mouth daily.    Marland Kitchen omeprazole (PRILOSEC) 40 MG capsule Take 40 mg by mouth daily.    . phenylephrine (SUDAFED PE) 10 MG TABS tablet Take 10 mg by mouth as needed.    . Probiotic Product (ALIGN PO) Take by mouth daily.    . traMADol (ULTRAM) 50 MG tablet Take 1 tablet (50 mg total) by mouth every 6 (six) hours as needed. 30 tablet 0  . diclofenac sodium (VOLTAREN) 1 % GEL Apply 2-4  g topically 4 (four) times daily. 5 Tube 1   No current facility-administered medications for this visit.     Patient Active Problem List   Diagnosis Date Noted  . Chronic pain of left knee 04/17/2018  . Right low back pain 04/17/2018  . Fatigue 08/21/2017  . Bradycardia 08/21/2017  . Atypical chest pain 08/21/2017  . Bronchitis 07/21/2016  . Cough 09/17/2015  . Anxiety 12/08/2013  . Obesity (BMI 30-39.9) 08/02/2013  . Rhinitis 08/02/2013  . Cervical stenosis of spine 04/26/2011  . Hyperlipidemia 06/05/2007  . Reactive airway disease 06/05/2007  . GERD 06/05/2007   Past Medical History:  Diagnosis Date  . GERD (gastroesophageal reflux disease)   . Hyperlipidemia   . Hypertension   . Reactive airway disease    post CAP    Family History  Adopted: Yes    Past Surgical History:  Procedure Laterality Date  . CARDIAC CATHETERIZATION  1997   negative  . COLONOSCOPY  2007   Dr Cristina Gong  . DILATION AND CURETTAGE OF UTERUS    . UPPER GI ENDOSCOPY     X 2  . WISDOM TOOTH EXTRACTION     Social History   Occupational History  . Not on file  Tobacco Use  . Smoking status: Former Smoker    Last attempt  to quit: 03/14/1984    Years since quitting: 34.3  . Smokeless tobacco: Never Used  . Tobacco comment: smoked 1975-1986, up to 1.5 ppd  Substance and Sexual Activity  . Alcohol use: No  . Drug use: No  . Sexual activity: Not on file

## 2018-07-17 ENCOUNTER — Other Ambulatory Visit: Payer: Self-pay | Admitting: Internal Medicine

## 2018-07-17 ENCOUNTER — Telehealth: Payer: Self-pay | Admitting: Internal Medicine

## 2018-07-18 MED ORDER — HYDROCHLOROTHIAZIDE 12.5 MG PO CAPS: ORAL_CAPSULE | ORAL | 0 refills | Status: DC

## 2018-07-18 MED FILL — HYDROCHLOROTHIAZIDE 12.5 MG: 12.5 | 30 days supply | Qty: 30 | Fill #0

## 2018-07-18 NOTE — Addendum Note (Signed)
Addended by: Delice Bison E on: 07/18/2018 03:24 PM   Modules accepted: Orders

## 2018-07-18 NOTE — Telephone Encounter (Signed)
Patient has made appt

## 2018-07-26 NOTE — Patient Instructions (Addendum)
A bone density test was ordered.  Tests ordered today. Your results will be released to [facility] (or called to you) after review, usually within 72hours after test completion. If any changes need to be made, you will be notified at that same time.  All other Health Maintenance issues reviewed.   All recommended immunizations and age-appropriate screenings are up-to-date or discussed.  No immunizations administered today.   Medications reviewed and updated.  Changes include :   none  Your prescription(s) have been submitted to your pharmacy. Please take as directed and contact our office if you believe you are having problem(s) with the medication(s).   Please followup in one year   Health Maintenance, Female Adopting a healthy lifestyle and getting preventive care can go a long way to promote health and wellness. Talk with your health care provider about what schedule of regular examinations is right for you. This is a good chance for you to check in with your provider about disease prevention and staying healthy. In between checkups, there are plenty of things you can do on your own. Experts have done a lot of research about which lifestyle changes and preventive measures are most likely to keep you healthy. Ask your health care provider for more information. Weight and diet Eat a healthy diet  Be sure to include plenty of vegetables, fruits, low-fat dairy products, and lean protein.  Do not eat a lot of foods high in solid fats, added sugars, or salt.  Get regular exercise. This is one of the most important things you can do for your health. ? Most adults should exercise for at least 150 minutes each week. The exercise should increase your heart rate and make you sweat (moderate-intensity exercise). ? Most adults should also do strengthening exercises at least twice a week. This is in addition to the moderate-intensity exercise. Maintain a healthy weight  Body mass index (BMI) is a  measurement that can be used to identify possible weight problems. It estimates body fat based on height and weight. Your health care provider can help determine your BMI and help you achieve or maintain a healthy weight.  For females 3 years of age and older: ? A BMI below 18.5 is considered underweight. ? A BMI of 18.5 to 24.9 is normal. ? A BMI of 25 to 29.9 is considered overweight. ? A BMI of 30 and above is considered obese. Watch levels of cholesterol and blood lipids  You should start having your blood tested for lipids and cholesterol at 62 years of age, then have this test every 5 years.  You may need to have your cholesterol levels checked more often if: ? Your lipid or cholesterol levels are high. ? You are older than 63 years of age. ? You are at high risk for heart disease. Cancer screening Lung Cancer  Lung cancer screening is recommended for adults 76-16 years old who are at high risk for lung cancer because of a history of smoking.  A yearly low-dose CT scan of the lungs is recommended for people who: ? Currently smoke. ? Have quit within the past 15 years. ? Have at least a 30-pack-year history of smoking. A pack year is smoking an average of one pack of cigarettes a day for 1 year.  Yearly screening should continue until it has been 15 years since you quit.  Yearly screening should stop if you develop a health problem that would prevent you from having lung cancer treatment. Breast Cancer  Practice breast self-awareness. This means understanding how your breasts normally appear and feel.  It also means doing regular breast self-exams. Let your health care provider know about any changes, no matter how small.  If you are in your 20s or 30s, you should have a clinical breast exam (CBE) by a health care provider every 1-3 years as part of a regular health exam.  If you are 57 or older, have a CBE every year. Also consider having a breast X-ray (mammogram) every  year.  If you have a family history of breast cancer, talk to your health care provider about genetic screening.  If you are at high risk for breast cancer, talk to your health care provider about having an MRI and a mammogram every year.  Breast cancer gene (BRCA) assessment is recommended for women who have family members with BRCA-related cancers. BRCA-related cancers include: ? Breast. ? Ovarian. ? Tubal. ? Peritoneal cancers.  Results of the assessment will determine the need for genetic counseling and BRCA1 and BRCA2 testing. Cervical Cancer Your health care provider may recommend that you be screened regularly for cancer of the pelvic organs (ovaries, uterus, and vagina). This screening involves a pelvic examination, including checking for microscopic changes to the surface of your cervix (Pap test). You may be encouraged to have this screening done every 3 years, beginning at age 66.  For women ages 20-65, health care providers may recommend pelvic exams and Pap testing every 3 years, or they may recommend the Pap and pelvic exam, combined with testing for human papilloma virus (HPV), every 5 years. Some types of HPV increase your risk of cervical cancer. Testing for HPV may also be done on women of any age with unclear Pap test results.  Other health care providers may not recommend any screening for nonpregnant women who are considered low risk for pelvic cancer and who do not have symptoms. Ask your health care provider if a screening pelvic exam is right for you.  If you have had past treatment for cervical cancer or a condition that could lead to cancer, you need Pap tests and screening for cancer for at least 20 years after your treatment. If Pap tests have been discontinued, your risk factors (such as having a new sexual partner) need to be reassessed to determine if screening should resume. Some women have medical problems that increase the chance of getting cervical cancer. In  these cases, your health care provider may recommend more frequent screening and Pap tests. Colorectal Cancer  This type of cancer can be detected and often prevented.  Routine colorectal cancer screening usually begins at 63 years of age and continues through 63 years of age.  Your health care provider may recommend screening at an earlier age if you have risk factors for colon cancer.  Your health care provider may also recommend using home test kits to check for hidden blood in the stool.  A small camera at the end of a tube can be used to examine your colon directly (sigmoidoscopy or colonoscopy). This is done to check for the earliest forms of colorectal cancer.  Routine screening usually begins at age 32.  Direct examination of the colon should be repeated every 5-10 years through 63 years of age. However, you may need to be screened more often if early forms of precancerous polyps or small growths are found. Skin Cancer  Check your skin from head to toe regularly.  Tell your health care provider about any  new moles or changes in moles, especially if there is a change in a mole's shape or color.  Also tell your health care provider if you have a mole that is larger than the size of a pencil eraser.  Always use sunscreen. Apply sunscreen liberally and repeatedly throughout the day.  Protect yourself by wearing long sleeves, pants, a wide-brimmed hat, and sunglasses whenever you are outside. Heart disease, diabetes, and high blood pressure  High blood pressure causes heart disease and increases the risk of stroke. High blood pressure is more likely to develop in: ? People who have blood pressure in the high end of the normal range (130-139/85-89 mm Hg). ? People who are overweight or obese. ? People who are African American.  If you are 68-58 years of age, have your blood pressure checked every 3-5 years. If you are 60 years of age or older, have your blood pressure checked  every year. You should have your blood pressure measured twice-once when you are at a hospital or clinic, and once when you are not at a hospital or clinic. Record the average of the two measurements. To check your blood pressure when you are not at a hospital or clinic, you can use: ? An automated blood pressure machine at a pharmacy. ? A home blood pressure monitor.  If you are between 53 years and 56 years old, ask your health care provider if you should take aspirin to prevent strokes.  Have regular diabetes screenings. This involves taking a blood sample to check your fasting blood sugar level. ? If you are at a normal weight and have a low risk for diabetes, have this test once every three years after 63 years of age. ? If you are overweight and have a high risk for diabetes, consider being tested at a younger age or more often. Preventing infection Hepatitis B  If you have a higher risk for hepatitis B, you should be screened for this virus. You are considered at high risk for hepatitis B if: ? You were born in a country where hepatitis B is common. Ask your health care provider which countries are considered high risk. ? Your parents were born in a high-risk country, and you have not been immunized against hepatitis B (hepatitis B vaccine). ? You have HIV or AIDS. ? You use needles to inject street drugs. ? You live with someone who has hepatitis B. ? You have had sex with someone who has hepatitis B. ? You get hemodialysis treatment. ? You take certain medicines for conditions, including cancer, organ transplantation, and autoimmune conditions. Hepatitis C  Blood testing is recommended for: ? Everyone born from [birth year] through 1965. ? Anyone with known risk factors for hepatitis C. Sexually transmitted infections (STIs)  You should be screened for sexually transmitted infections (STIs) including gonorrhea and chlamydia if: ? You are sexually active and are younger than 63 years of  age. ? You are older than 63 years of age and your health care provider tells you that you are at risk for this type of infection. ? Your sexual activity has changed since you were last screened and you are at an increased risk for chlamydia or gonorrhea. Ask your health care provider if you are at risk.  If you do not have HIV, but are at risk, it may be recommended that you take a prescription medicine daily to prevent HIV infection. This is called pre-exposure prophylaxis (PrEP). You are considered at risk if: ?  You are sexually active and do not regularly use condoms or know the HIV status of your partner(s). ? You take drugs by injection. ? You are sexually active with a partner who has HIV. Talk with your health care provider about whether you are at high risk of being infected with HIV. If you choose to begin PrEP, you should first be tested for HIV. You should then be tested every 3 months for as long as you are taking PrEP. Pregnancy  If you are premenopausal and you may become pregnant, ask your health care provider about preconception counseling.  If you may become pregnant, take 400 to 800 micrograms (mcg) of folic acid every day.  If you want to prevent pregnancy, talk to your health care provider about birth control (contraception). Osteoporosis and menopause  Osteoporosis is a disease in which the bones lose minerals and strength with aging. This can result in serious bone fractures. Your risk for osteoporosis can be identified using a bone density scan.  If you are 22 years of age or older, or if you are at risk for osteoporosis and fractures, ask your health care provider if you should be screened.  Ask your health care provider whether you should take a calcium or vitamin D supplement to lower your risk for osteoporosis.  Menopause may have certain physical symptoms and risks.  Hormone replacement therapy may reduce some of these symptoms and risks. Talk to your health  care provider about whether hormone replacement therapy is right for you. Follow these instructions at home:  Schedule regular health, dental, and eye exams.  Stay current with your immunizations.  Do not use any tobacco products including cigarettes, chewing tobacco, or electronic cigarettes.  If you are pregnant, do not drink alcohol.  If you are breastfeeding, limit how much and how often you drink alcohol.  Limit alcohol intake to no more than 1 drink per day for nonpregnant women. One drink equals 12 ounces of beer, 5 ounces of wine, or 1 ounces of hard liquor.  Do not use street drugs.  Do not share needles.  Ask your health care provider for help if you need support or information about quitting drugs.  Tell your health care provider if you often feel depressed.  Tell your health care provider if you have ever been abused or do not feel safe at home. This information is not intended to replace advice given to you by your health care provider. Make sure you discuss any questions you have with your health care provider. Document Released: 09/13/2010 Document Revised: 08/06/2015 Document Reviewed: 12/02/2014 Elsevier Interactive Patient Education  2019 [employer].

## 2018-07-26 NOTE — Progress Notes (Signed)
Subjective:    Patient ID: Dana Mccoy, female    DOB: 05-Dec-1955, 63 y.o.   MRN: [id number]  HPI She is here for a physical exam.   Dec hearing in left ear at time - pressure and ? Related to salt intake.  Ears do not feel even at times.   RLS:  She thinks she has RLS.  She had a sleep study and moved her legs a lot, but that was related to back pain.  She still has some restless symptoms.  It is not at the point that she feels the need to go on medication.   Medications and allergies reviewed with patient and updated if appropriate.  Patient Active Problem List   Diagnosis Date Noted  . Chronic pain of left knee 04/17/2018  . Right low back pain 04/17/2018  . Fatigue 08/21/2017  . Bradycardia 08/21/2017  . Atypical chest pain 08/21/2017  . Bronchitis 07/21/2016  . Cough 09/17/2015  . Anxiety 12/08/2013  . Obesity (BMI 30-39.9) 08/02/2013  . Rhinitis 08/02/2013  . Cervical stenosis of spine 04/26/2011  . Hyperlipidemia 06/05/2007  . Reactive airway disease 06/05/2007  . GERD 06/05/2007    Current Outpatient Medications on File Prior to Visit  Medication Sig Dispense Refill  . acetaminophen (TYLENOL) 500 MG tablet Take 500 mg by mouth every 6 (six) hours as needed.    . ACYCLOVIR PO Take 500 mg by mouth daily. Patient states she has a 500mg  tab (not an option in epic), filled by another MD    . albuterol (VENTOLIN HFA) 108 (90 Base) MCG/ACT inhaler INHALE 2 PUFFS EVERY 4 TO 6 HOURS AS NEEDED FOR COUGH OR WHEEZE *MAY USE 10-20 MIN PRIOR TO EXERCISE 18 g 3  . Ascorbic Acid (VITAMIN C PO) Take 1 tablet by mouth daily. 1000mg     . BREO ELLIPTA 100-25 MCG/INH AEPB INHALE 1 PUFF BY MOUTH DAILY AS NEEDED. 60 each 0  . Calcium Carb-Cholecalciferol (CALCIUM/VITAMIN D PO) Take by mouth daily.    . Cetirizine HCl (ZYRTEC ALLERGY) 10 MG CAPS Take 10 mg by mouth.    . diclofenac sodium (VOLTAREN) 1 % GEL Apply 2-4 g topically 4 (four) times daily. 5 Tube 1  . FLUoxetine  (PROZAC) 20 MG tablet Take 20 mg by mouth daily.    . hydrochlorothiazide (MICROZIDE) 12.5 MG capsule TAKE 1 CAPSULE BY MOUTH DAILY. -- OFFICE VISIT NEEDED FOR FURTHER REFILLS 30 capsule 0  . methocarbamol (ROBAXIN) 500 MG tablet 1 tablet at bedtime 30 tablet 0  . mometasone (NASONEX) 50 MCG/ACT nasal spray Nasonex 50 mcg/actuation Spray    . Multiple Vitamin (MULTIVITAMIN) tablet Take 1 tablet by mouth daily.    . naproxen sodium (ALEVE) 220 MG tablet Take 220 mg by mouth as needed.    . phenylephrine (SUDAFED PE) 10 MG TABS tablet Take 10 mg by mouth as needed.    . Probiotic Product (ALIGN PO) Take by mouth daily.    . traMADol (ULTRAM) 50 MG tablet Take 1 tablet (50 mg total) by mouth every 6 (six) hours as needed. 30 tablet 0  . valACYclovir (VALTREX) 500 MG tablet daily.     No current facility-administered medications on file prior to visit.     Past Medical History:  Diagnosis Date  . GERD (gastroesophageal reflux disease)   . Hyperlipidemia   . Hypertension   . Reactive airway disease    post CAP    Past Surgical History:  Procedure Laterality Date  .  CARDIAC CATHETERIZATION  1997   negative  . COLONOSCOPY  2007   Dr Cristina Gong  . DILATION AND CURETTAGE OF UTERUS    . UPPER GI ENDOSCOPY     X 2  . WISDOM TOOTH EXTRACTION      Social History   Socioeconomic History  . Marital status: Divorced    Spouse name: Not on file  . Number of children: Not on file  . Years of education: Not on file  . Highest education level: Not on file  Occupational History  . Not on file  Social Needs  . Financial resource strain: Not on file  . Food insecurity:    Worry: Not on file    Inability: Not on file  . Transportation needs:    Medical: Not on file    Non-medical: Not on file  Tobacco Use  . Smoking status: Former Smoker    Last attempt to quit: 03/14/1984    Years since quitting: 34.3  . Smokeless tobacco: Never Used  . Tobacco comment: smoked 1975-1986, up to 1.5 ppd   Substance and Sexual Activity  . Alcohol use: No  . Drug use: No  . Sexual activity: Not on file  Lifestyle  . Physical activity:    Days per week: Not on file    Minutes per session: Not on file  . Stress: Not on file  Relationships  . Social connections:    Talks on phone: Not on file    Gets together: Not on file    Attends religious service: Not on file    Active member of club or organization: Not on file    Attends meetings of clubs or organizations: Not on file    Relationship status: Not on file  Other Topics Concern  . Not on file  Social History Narrative   Case manager - [employer]      Exercise: 2-4 times a week    Family History  Adopted: Yes    Review of Systems  Constitutional: Negative for chills and fever.  Eyes: Negative for visual disturbance.  Respiratory: Positive for wheezing (occ). Negative for cough and shortness of breath.   Cardiovascular: Positive for palpitations (occ, transient) and leg swelling (occ). Negative for chest pain.  Gastrointestinal: Negative for abdominal pain, blood in stool, constipation, diarrhea and nausea.       Rare gerd - controlled  Genitourinary: Negative for dysuria and hematuria.  Musculoskeletal: Positive for arthralgias (knees) and back pain.  Skin: Negative for rash.  Neurological: Positive for headaches (sinus, allergy). Negative for dizziness and light-headedness.  Psychiatric/Behavioral: Negative for dysphoric mood. The patient is not nervous/anxious.        Objective:   Vitals:   07/27/18 1449  BP: 118/76  Pulse: 76  Temp: 98.1 F (36.7 C)  SpO2: 96%   Filed Weights   07/27/18 1449  Weight: 206 lb (93.4 kg)   Body mass index is 34.28 kg/m.  BP Readings from Last 3 Encounters:  07/27/18 118/76  07/10/18 127/78  06/15/18 126/71    Wt Readings from Last 3 Encounters:  07/27/18 206 lb (93.4 kg)  07/10/18 196 lb (88.9 kg)  06/15/18 194 lb (88 kg)     Physical Exam Constitutional: She appears  well-developed and well-nourished. No distress.  HENT:  Head: Normocephalic and atraumatic.  Right Ear: External ear normal. Normal ear canal and TM Left Ear: External ear normal.  Normal ear canal and TM Mouth/Throat: Oropharynx is clear and moist.  Eyes:  Conjunctivae and EOM are normal.  Neck: Neck supple. No tracheal deviation present. No thyromegaly present.  No carotid bruit  Cardiovascular: Normal rate, regular rhythm and normal heart sounds.   No murmur heard.  No edema. Pulmonary/Chest: Effort normal and breath sounds normal. No respiratory distress. She has no wheezes. She has no rales.  Breast: deferred   Abdominal: Soft. She exhibits no distension. There is no tenderness.  Lymphadenopathy: She has no cervical adenopathy.  Skin: Skin is warm and dry. She is not diaphoretic.  Psychiatric: She has a normal mood and affect. Her behavior is normal.        Assessment & Plan:   Physical exam: Screening blood work ordered Immunizations   Discussed shingrix, tdap - done at work, others up to date Colonoscopy    Up to date  Mammogram   Up to date  Gyn   Up to date  Dexa    due-ordered Eye exams  Up to date  Exercise  Walking dogs Weight encouraged weight loss Skin  No concerns Substance abuse   none  See Problem List for Assessment and Plan of chronic medical problems.    The 10-year ASCVD risk score Mikey Bussing DC Brooke Bonito., et al., 2013) is: 5.6%   Values used to calculate the score:     Age: 23 years     Sex: Female     Is Non-Hispanic African American: No     Diabetic: No     Tobacco smoker: No     Systolic Blood Pressure: 073 mmHg     Is BP treated: Yes     HDL Cholesterol: 55 mg/dL     Total Cholesterol: 231 mg/dL

## 2018-07-27 ENCOUNTER — Ambulatory Visit (INDEPENDENT_AMBULATORY_CARE_PROVIDER_SITE_OTHER)
Admission: RE | Admit: 2018-07-27 | Discharge: 2018-07-27 | Disposition: A | Discharge status: Home / Self Care | Payer: 59 | Source: Ambulatory Visit | Attending: Internal Medicine | Admitting: Internal Medicine

## 2018-07-27 ENCOUNTER — Other Ambulatory Visit: Payer: Self-pay

## 2018-07-27 ENCOUNTER — Encounter: Payer: Self-pay | Admitting: Internal Medicine

## 2018-07-27 ENCOUNTER — Other Ambulatory Visit (INDEPENDENT_AMBULATORY_CARE_PROVIDER_SITE_OTHER): Payer: 59

## 2018-07-27 ENCOUNTER — Ambulatory Visit (INDEPENDENT_AMBULATORY_CARE_PROVIDER_SITE_OTHER): Payer: 59 | Admitting: Internal Medicine

## 2018-07-27 VITALS — BP 118/76 | HR 76 | Temp 98.1°F | Ht 65.0 in | Wt 206.0 lb

## 2018-07-27 DIAGNOSIS — J4541 Moderate persistent asthma with (acute) exacerbation: Secondary | ICD-10-CM | POA: Diagnosis not present

## 2018-07-27 DIAGNOSIS — Z0000 Encounter for general adult medical examination without abnormal findings: Secondary | ICD-10-CM

## 2018-07-27 DIAGNOSIS — E7849 Other hyperlipidemia: Secondary | ICD-10-CM | POA: Diagnosis not present

## 2018-07-27 DIAGNOSIS — Z13820 Encounter for screening for osteoporosis: Secondary | ICD-10-CM

## 2018-07-27 DIAGNOSIS — K219 Gastro-esophageal reflux disease without esophagitis: Secondary | ICD-10-CM

## 2018-07-27 DIAGNOSIS — E2839 Other primary ovarian failure: Secondary | ICD-10-CM

## 2018-07-27 DIAGNOSIS — G2581 Restless legs syndrome: Secondary | ICD-10-CM | POA: Insufficient documentation

## 2018-07-27 LAB — COMPREHENSIVE METABOLIC PANEL
ALT: 18 U/L (ref 0–35)
AST: 24 U/L (ref 0–37)
Albumin: 4.2 g/dL (ref 3.5–5.2)
Alkaline Phosphatase: 111 U/L (ref 39–117)
BUN: 19 mg/dL (ref 6–23)
CO2: 29 mEq/L (ref 19–32)
Calcium: 9.5 mg/dL (ref 8.4–10.5)
Chloride: 103 mEq/L (ref 96–112)
Creatinine, Ser: 0.75 mg/dL (ref 0.40–1.20)
GFR: 78 mL/min (ref >60.00)
Glucose, Bld: 96 mg/dL (ref 70–99)
Potassium: 3.4 mEq/L — ABNORMAL LOW (ref 3.5–5.1)
Sodium: 141 mEq/L (ref 135–145)
Total Bilirubin: 0.4 mg/dL (ref 0.2–1.2)
Total Protein: 7.1 g/dL (ref 6.0–8.3)

## 2018-07-27 LAB — CBC WITH DIFFERENTIAL/PLATELET
Basophils Absolute: 0.1 10*3/uL (ref 0.0–0.1)
Basophils Relative: 0.8 % (ref 0.0–3.0)
Eosinophils Absolute: 0.1 10*3/uL (ref 0.0–0.7)
Eosinophils Relative: 2 % (ref 0.0–5.0)
HCT: 40.4 % (ref 36.0–46.0)
Hemoglobin: 13.5 g/dL (ref 12.0–15.0)
Lymphocytes Relative: 21.9 % (ref 12.0–46.0)
Lymphs Abs: 1.6 10*3/uL (ref 0.7–4.0)
MCHC: 33.4 g/dL (ref 30.0–36.0)
MCV: 84.4 fl (ref 78.0–100.0)
Monocytes Absolute: 0.7 10*3/uL (ref 0.1–1.0)
Monocytes Relative: 9.1 % (ref 3.0–12.0)
Neutro Abs: 4.9 10*3/uL (ref 1.4–7.7)
Neutrophils Relative %: 66.2 % (ref 43.0–77.0)
Platelets: 294 10*3/uL (ref 150.0–400.0)
RBC: 4.79 Mil/uL (ref 3.87–5.11)
RDW: 14.5 % (ref 11.5–15.5)
WBC: 7.4 10*3/uL (ref 4.0–10.5)

## 2018-07-27 LAB — MAGNESIUM: Magnesium: 1.9 mg/dL (ref 1.5–2.5)

## 2018-07-27 LAB — TSH: TSH: 3.54 u[IU]/mL (ref 0.35–4.50)

## 2018-07-27 LAB — LIPID PANEL
Cholesterol: 261 mg/dL — ABNORMAL HIGH (ref 0–200)
HDL: 54.3 mg/dL (ref >39.00)
LDL Cholesterol: 169 mg/dL — ABNORMAL HIGH (ref 0–99)
NonHDL: 206.9
Total CHOL/HDL Ratio: 5
Triglycerides: 192 mg/dL — ABNORMAL HIGH (ref 0.0–149.0)
VLDL: 38.4 mg/dL (ref 0.0–40.0)

## 2018-07-27 LAB — VITAMIN B12: Vitamin B-12: 1027 pg/mL — ABNORMAL HIGH (ref 211–911)

## 2018-07-27 MED ORDER — FLUOXETINE HCL 20 MG PO TABS: 20.0000 mg | ORAL_TABLET | Freq: Every day | ORAL | 3 refills | Status: DC

## 2018-07-27 MED ORDER — VALACYCLOVIR HCL 500 MG PO TABS: 500.0000 mg | ORAL_TABLET | Freq: Every day | ORAL | 3 refills | Status: DC

## 2018-07-27 MED ORDER — OMEPRAZOLE 40 MG PO CPDR: 40.0000 mg | DELAYED_RELEASE_CAPSULE | Freq: Every day | ORAL | 3 refills | Status: DC

## 2018-07-27 MED ORDER — TRIAMCINOLONE ACETONIDE 0.1 % EX CREA: 1.0000 "application " | TOPICAL_CREAM | Freq: Two times a day (BID) | CUTANEOUS | 2 refills | Status: DC

## 2018-07-27 MED FILL — FLUoxetine HCL 20 MG TABS: 20 | 90 days supply | Qty: 90 | Fill #0

## 2018-07-27 MED FILL — OMEPRAZOLE 40 MG CPDR: 40 | 90 days supply | Qty: 90 | Fill #0

## 2018-07-27 MED FILL — TRIAMCINOLONE 0.1% CREAM: 0.1 | 15 days supply | Qty: 30 | Fill #0

## 2018-07-27 MED FILL — VALACYCLOVIR HCL 500 MG TAB: 500 | 90 days supply | Qty: 90 | Fill #0

## 2018-07-28 LAB — IRON,TIBC AND FERRITIN PANEL
%SAT: 15 % (calc) — ABNORMAL LOW (ref 16–45)
Ferritin: 12 ng/mL — ABNORMAL LOW (ref 16–288)
Iron: 59 ug/dL (ref 45–160)
TIBC: 404 mcg/dL (calc) (ref 250–450)

## 2018-07-29 NOTE — Assessment & Plan Note (Signed)
GERD controlled Continue daily medication  

## 2018-07-29 NOTE — Assessment & Plan Note (Signed)
Symptoms c/w restless leg syndrome and this was seen during sleep study She does not feel she needs medication at this time Will check CMP, iron panel, CBC, B12

## 2018-07-29 NOTE — Assessment & Plan Note (Signed)
Not on medication Has seen cardiology - discussed starting a statin Will check lipids but not fasting Consider rechecking later in the summer and then decide about medication Check lipid panel, cmp, tsh  Continue daily statin Regular exercise and healthy diet encouraged

## 2018-07-29 NOTE — Assessment & Plan Note (Signed)
Controlled  Continue current inhalers

## 2018-07-30 ENCOUNTER — Encounter: Payer: Self-pay | Admitting: Internal Medicine

## 2018-07-30 DIAGNOSIS — R790 Abnormal level of blood mineral: Secondary | ICD-10-CM | POA: Insufficient documentation

## 2018-08-07 ENCOUNTER — Encounter: Payer: Self-pay | Admitting: Orthopaedic Surgery

## 2018-08-07 ENCOUNTER — Other Ambulatory Visit: Payer: Self-pay

## 2018-08-07 ENCOUNTER — Ambulatory Visit: Payer: 59 | Admitting: Orthopaedic Surgery

## 2018-08-07 VITALS — BP 124/78 | HR 70 | Ht 65.0 in | Wt 206.0 lb

## 2018-08-07 DIAGNOSIS — M25551 Pain in right hip: Secondary | ICD-10-CM

## 2018-08-07 NOTE — Progress Notes (Signed)
Office Visit Note   Patient: Dana Mccoy           Date of Birth: 04-Dec-1955           MRN: [id number] Visit Date: 08/07/2018              Requested by: Binnie Rail, MD [place], [place] [address] PCP: Binnie Rail, MD   Assessment & Plan: Visit Diagnoses:  1. Pain of right hip joint     Plan: Really doing quite well working 8-hour days 4 days a week.  Only has sporadic pain that is controlled with over-the-counter medicines and Voltaren gel.  Her a note stating that she can work 5 days a week 8 hours at a time as long as she has a sedentary position otherwise she can continue with her 4 days a week with 8 hours with a combination of up-and-down activities will keep this in place at least 3 months and possibly on a chronic basis.  No further treatment for her hip or back pain as she is comfortable.  Consider further lateral hip injections over time  Follow-Up Instructions: Return in about 3 months (around 11/07/2018).   Orders:  No orders of the defined types were placed in this encounter.  No orders of the defined types were placed in this encounter.     Procedures: No procedures performed   Clinical Data: No additional findings.   Subjective: Chief Complaint  Patient presents with  . Right Hip - Follow-up  . Lower Back - Follow-up  Patient presents today for a four week follow up on her lower back and right hip pain. She said that she is doing much better. She is wanting to go back to work, but is fearful of doing 12hr shifts. She is wanting to do just 8hr shifts.  Presently doing Fairly well with only minimal discomfort on occasion localized to the area of her right hip and groin.  No related numbness or tingling.  No bowel or bladder changes. HPI  Review of Systems  Constitutional: Negative for fatigue.  HENT: Negative for ear pain.   Eyes: Negative for pain.  Respiratory: Negative for shortness of breath.   Cardiovascular: Negative  for leg swelling.  Gastrointestinal: Negative for constipation and diarrhea.  Endocrine: Negative for cold intolerance and heat intolerance.  Genitourinary: Negative for difficulty urinating.  Musculoskeletal: Negative for joint swelling.  Skin: Negative for rash.  Allergic/Immunologic: Negative for food allergies.  Neurological: Negative for weakness.  Hematological: Does not bruise/bleed easily.  Psychiatric/Behavioral: Negative for sleep disturbance.     Objective: Vital Signs: BP 124/78   Pulse 70   Ht 5\' 5"  (1.651 m)   Wt 206 lb (93.4 kg)   BMI 34.28 kg/m   Physical Exam Constitutional:      Appearance: She is well-developed.  Eyes:     Pupils: Pupils are equal, round, and reactive to light.  Pulmonary:     Effort: Pulmonary effort is normal.  Skin:    General: Skin is warm and dry.  Neurological:     Mental Status: She is alert and oriented to person, place, and time.  Psychiatric:        Behavior: Behavior normal.     Ortho Exam awake alert and oriented x3.  Comfortable sitting.  Straight leg raise negative bilaterally.  Walks without use of a cane or limp.  Painless range of motion right hip.  No significant localized tenderness over the  anterior lateral aspect right hip.  No distal edema.  Neurologically intact  Specialty Comments:  No specialty comments available.  Imaging: No results found.   PMFS History: Patient Active Problem List   Diagnosis Date Noted  . Low ferritin 07/30/2018  . Restless leg syndrome 07/27/2018  . Chronic pain of left knee 04/17/2018  . Right low back pain 04/17/2018  . Fatigue 08/21/2017  . Bradycardia 08/21/2017  . Atypical chest pain 08/21/2017  . Cough 09/17/2015  . Anxiety 12/08/2013  . Obesity (BMI 30-39.9) 08/02/2013  . Rhinitis 08/02/2013  . Cervical stenosis of spine 04/26/2011  . Hyperlipidemia 06/05/2007  . Reactive airway disease 06/05/2007  . GERD 06/05/2007   Past Medical History:  Diagnosis Date  .  GERD (gastroesophageal reflux disease)   . Hyperlipidemia   . Hypertension   . Reactive airway disease    post CAP    Family History  Adopted: Yes    Past Surgical History:  Procedure Laterality Date  . CARDIAC CATHETERIZATION  1997   negative  . COLONOSCOPY  2007   Dr Cristina Gong  . DILATION AND CURETTAGE OF UTERUS    . UPPER GI ENDOSCOPY     X 2  . WISDOM TOOTH EXTRACTION     Social History   Occupational History  . Not on file  Tobacco Use  . Smoking status: Former Smoker    Last attempt to quit: 03/14/1984    Years since quitting: 34.4  . Smokeless tobacco: Never Used  . Tobacco comment: smoked 1975-1986, up to 1.5 ppd  Substance and Sexual Activity  . Alcohol use: No  . Drug use: No  . Sexual activity: Not on file

## 2018-08-08 ENCOUNTER — Other Ambulatory Visit: Payer: Self-pay | Admitting: *Deleted

## 2018-08-08 ENCOUNTER — Telehealth: Payer: Self-pay | Admitting: Orthopaedic Surgery

## 2018-08-08 ENCOUNTER — Telehealth: Payer: Self-pay | Admitting: *Deleted

## 2018-08-08 NOTE — Telephone Encounter (Signed)
LMOM, read letter in patient's chart.

## 2018-08-08 NOTE — Telephone Encounter (Signed)
Letter revised, patient requesting permanent schedule, 5 days weekly - 8 hours daily with sitting and ambulating. Letter faxed to [phone].

## 2018-08-08 NOTE — Telephone Encounter (Signed)
LMOM for patient to return call.

## 2018-08-08 NOTE — Telephone Encounter (Signed)
Ashley from [facility] [employer] called requesting a return call to let her know the end date for patient's restrictions.  Please call [phone]

## 2018-08-08 NOTE — Telephone Encounter (Signed)
Check with patient-I think she wants them to be permanent

## 2018-08-08 NOTE — Telephone Encounter (Signed)
[employer] [employer] needs to know the end dates for the patient's restrictions for letter written 08/07/2018 or are these permanent restrictions. These are not the patients normal working hours. Please advise.

## 2018-08-14 ENCOUNTER — Encounter: Payer: 59 | Admitting: Internal Medicine

## 2018-08-15 ENCOUNTER — Encounter: Payer: Self-pay | Admitting: Internal Medicine

## 2018-08-16 ENCOUNTER — Other Ambulatory Visit: Payer: Self-pay | Admitting: Internal Medicine

## 2018-08-16 MED FILL — HYDROCHLOROTHIAZIDE 12.5 MG: 12.5 | 90 days supply | Qty: 90 | Fill #0

## 2018-08-16 MED FILL — BREO ELLIPTA 100-25 MCG INH: 100-25 | 30 days supply | Qty: 60 | Fill #0

## 2018-10-24 MED FILL — BREO ELLIPTA 100-25 MCG INH: 100-25 | 30 days supply | Qty: 60 | Fill #1

## 2018-10-24 MED FILL — OMEPRAZOLE 40 MG CPDR: 40 | 90 days supply | Qty: 90 | Fill #1

## 2018-11-20 MED FILL — HYDROCHLOROTHIAZIDE 12.5 MG: 12.5 | 90 days supply | Qty: 90 | Fill #1

## 2018-12-17 MED FILL — VALACYCLOVIR HCL 500 MG TAB: 500 | 90 days supply | Qty: 90 | Fill #1

## 2018-12-17 MED FILL — FLUoxetine HCL 20 MG CAPS: 20 | 90 days supply | Qty: 90 | Fill #1

## 2018-12-17 MED FILL — BREO ELLIPTA 100-25 MCG INH: 100-25 | 30 days supply | Qty: 60 | Fill #2

## 2018-12-19 DIAGNOSIS — H40013 Open angle with borderline findings, low risk, bilateral: Secondary | ICD-10-CM | POA: Diagnosis not present

## 2018-12-19 DIAGNOSIS — H524 Presbyopia: Secondary | ICD-10-CM | POA: Diagnosis not present

## 2018-12-19 DIAGNOSIS — H25013 Cortical age-related cataract, bilateral: Secondary | ICD-10-CM | POA: Diagnosis not present

## 2018-12-19 DIAGNOSIS — H35363 Drusen (degenerative) of macula, bilateral: Secondary | ICD-10-CM | POA: Diagnosis not present

## 2018-12-19 DIAGNOSIS — H43811 Vitreous degeneration, right eye: Secondary | ICD-10-CM | POA: Diagnosis not present

## 2019-01-17 ENCOUNTER — Other Ambulatory Visit: Payer: Self-pay | Admitting: Internal Medicine

## 2019-01-17 MED FILL — ALBUTEROL SULFATE HFA 108 (: 108 (90 BAS | 16 days supply | Qty: 18 | Fill #0

## 2019-01-17 MED FILL — OMEPRAZOLE 40 MG CPDR: 40 | 90 days supply | Qty: 90 | Fill #2

## 2019-02-18 ENCOUNTER — Other Ambulatory Visit: Payer: Self-pay | Admitting: Internal Medicine

## 2019-02-18 MED FILL — BREO ELLIPTA 100-25 MCG INH: 100-25 | 30 days supply | Qty: 60 | Fill #3

## 2019-02-18 MED FILL — HYDROCHLOROTHIAZIDE 12.5 MG: 12.5 | 90 days supply | Qty: 90 | Fill #0

## 2019-02-28 ENCOUNTER — Other Ambulatory Visit: Payer: Self-pay | Admitting: Internal Medicine

## 2019-02-28 DIAGNOSIS — Z1231 Encounter for screening mammogram for malignant neoplasm of breast: Secondary | ICD-10-CM

## 2019-03-20 MED FILL — VALACYCLOVIR HCL 500 MG TAB: 500 | 30 days supply | Qty: 30 | Fill #2

## 2019-03-20 MED FILL — FLUoxetine HCL 20 MG CAPS: 20 | 90 days supply | Qty: 90 | Fill #2

## 2019-03-21 ENCOUNTER — Ambulatory Visit (INDEPENDENT_AMBULATORY_CARE_PROVIDER_SITE_OTHER): Payer: 59 | Admitting: Family Medicine

## 2019-04-04 ENCOUNTER — Ambulatory Visit (INDEPENDENT_AMBULATORY_CARE_PROVIDER_SITE_OTHER): Payer: 59 | Admitting: Family Medicine

## 2019-04-15 ENCOUNTER — Other Ambulatory Visit: Payer: Self-pay | Admitting: Internal Medicine

## 2019-04-15 MED FILL — OMEPRAZOLE 40 MG CPDR: 40 | 90 days supply | Qty: 90 | Fill #3

## 2019-04-15 MED FILL — VALACYCLOVIR HCL 500 MG TAB: 500 | 30 days supply | Qty: 30 | Fill #3

## 2019-04-15 MED FILL — BREO ELLIPTA 100-25 MCG INH: 100-25 | 30 days supply | Qty: 60 | Fill #0

## 2019-04-17 ENCOUNTER — Other Ambulatory Visit: Payer: Self-pay

## 2019-04-17 ENCOUNTER — Ambulatory Visit
Admission: RE | Admit: 2019-04-17 | Discharge: 2019-04-17 | Disposition: A | Discharge status: Home / Self Care | Payer: 59 | Source: Ambulatory Visit | Attending: Internal Medicine | Admitting: Internal Medicine

## 2019-04-17 DIAGNOSIS — Z1231 Encounter for screening mammogram for malignant neoplasm of breast: Secondary | ICD-10-CM

## 2019-04-17 NOTE — Progress Notes (Signed)
Virtual Visit via Video Note  I connected with Dana Mccoy on 04/18/19 at  9:30 AM EST by a video enabled telemedicine application and verified that I am speaking with the correct person using two identifiers.   I discussed the limitations of evaluation and management by telemedicine and the availability of in person appointments. The patient expressed understanding and agreed to proceed.  Present for the visit:  Myself, Dr Billey Gosling, [name].  The patient is currently at home and I am in the office.    No referring provider.    History of Present Illness: She is here for an acute visit for cold symptoms.   Her symptoms started in the fall, but has waxed and waned since then.  The past few weeks have been worse.  She is experiencing headaches since the fall.  The past few weeks they have been more sinus in nature.  She had a sinus infection a few weeks ago and was tested for covid and it was neg.  She has ongoing teeth and face pain, along with the headaches.  She has not had any fevers or chills.  She has had sinus infections in the past, but nothing recent.  She has tried several medications and nothing seems to be helping her only helps temporarily.  She has tried taking zyrtec and sudafed, tylenol and aleve.  flonase as needed saline nasal spray.     Review of Systems  Constitutional: Negative for chills and fever.  HENT: Positive for sinus pain. Negative for congestion, ear pain (pressure) and sore throat.        Teeth pain  Respiratory: Negative for cough, shortness of breath and wheezing.   Neurological: Positive for headaches.      Social History   Socioeconomic History  . Marital status: Divorced    Spouse name: Not on file  . Number of children: Not on file  . Years of education: Not on file  . Highest education level: Not on file  Occupational History  . Not on file  Tobacco Use  . Smoking status: Former Smoker    Quit date: 03/14/1984   Years since quitting: 35.1  . Smokeless tobacco: Never Used  . Tobacco comment: smoked 1975-1986, up to 1.5 ppd  Substance and Sexual Activity  . Alcohol use: No  . Drug use: No  . Sexual activity: Not on file  Other Topics Concern  . Not on file  Social History Narrative   Case manager - [employer]      Exercise: 2-4 times a week   Social Determinants of Health   Financial Resource Strain:   . Difficulty of Paying Living Expenses: Not on file  Food Insecurity:   . Worried About Charity fundraiser in the Last Year: Not on file  . Ran Out of Food in the Last Year: Not on file  Transportation Needs:   . Lack of Transportation (Medical): Not on file  . Lack of Transportation (Non-Medical): Not on file  Physical Activity:   . Days of Exercise per Week: Not on file  . Minutes of Exercise per Session: Not on file  Stress:   . Feeling of Stress : Not on file  Social Connections:   . Frequency of Communication with Friends and Family: Not on file  . Frequency of Social Gatherings with Friends and Family: Not on file  . Attends Religious Services: Not on file  . Active Member of Clubs or Organizations: Not on file  .  Attends Archivist Meetings: Not on file  . Marital Status: Not on file     Observations/Objective: Appears well in NAD Breathing normally Skin appears warm and dry  Assessment and Plan:  See Problem List for Assessment and Plan of chronic medical problems.   Follow Up Instructions:    I discussed the assessment and treatment plan with the patient. The patient was provided an opportunity to ask questions and all were answered. The patient agreed with the plan and demonstrated an understanding of the instructions.   The patient was advised to call back or seek an in-person evaluation if the symptoms worsen or if the condition fails to improve as anticipated.    Dana Rail, MD

## 2019-04-18 ENCOUNTER — Encounter: Payer: Self-pay | Admitting: Internal Medicine

## 2019-04-18 ENCOUNTER — Ambulatory Visit (INDEPENDENT_AMBULATORY_CARE_PROVIDER_SITE_OTHER): Payer: 59 | Admitting: Internal Medicine

## 2019-04-18 DIAGNOSIS — J320 Chronic maxillary sinusitis: Secondary | ICD-10-CM

## 2019-04-18 DIAGNOSIS — J329 Chronic sinusitis, unspecified: Secondary | ICD-10-CM | POA: Insufficient documentation

## 2019-04-18 MED ORDER — AZITHROMYCIN 250 MG PO TABS: ORAL_TABLET | ORAL | 0 refills | Status: DC

## 2019-04-18 MED FILL — AZITHROMYCIN 250 MG TABLET: 250 | 5 days supply | Qty: 6 | Fill #0

## 2019-04-18 NOTE — Assessment & Plan Note (Signed)
Her symptoms are consistent with a sinus infection, likely bacterial Difficult to truly assess chronicity She has tried several things and has only had temporary relief We will try an antibiotic-Z-Pak, which has worked well for her in the past She may need a prolonged course-she will update me after she completes the Z-Pak She may also benefit from short steroid course, but we will hold off on that for now Continue allergy medications and nasal sprays

## 2019-04-21 ENCOUNTER — Encounter: Payer: Self-pay | Admitting: Internal Medicine

## 2019-04-22 MED FILL — AMOXICILLIN 500 MG CAPSULE: 500 | 7 days supply | Qty: 28 | Fill #0

## 2019-05-13 ENCOUNTER — Other Ambulatory Visit: Payer: Self-pay | Admitting: Internal Medicine

## 2019-05-13 ENCOUNTER — Encounter: Payer: Self-pay | Admitting: Internal Medicine

## 2019-05-13 MED FILL — VALACYCLOVIR HCL 500 MG TAB: 500 | 30 days supply | Qty: 30 | Fill #4

## 2019-05-14 MED FILL — HYDROCHLOROTHIAZIDE 12.5 MG: 12.5 | 90 days supply | Qty: 90 | Fill #0

## 2019-06-14 ENCOUNTER — Other Ambulatory Visit (HOSPITAL_COMMUNITY): Payer: Self-pay | Admitting: Obstetrics & Gynecology

## 2019-06-14 MED FILL — VALACYCLOVIR HCL 500 MG TAB: 500 | 90 days supply | Qty: 90 | Fill #5

## 2019-06-14 MED FILL — FLUoxetine HCL 20 MG CAPS: 20 | 90 days supply | Qty: 90 | Fill #0

## 2019-07-08 ENCOUNTER — Other Ambulatory Visit: Payer: Self-pay | Admitting: Internal Medicine

## 2019-07-08 MED FILL — BREO ELLIPTA 100-25 MCG INH: 100-25 | 30 days supply | Qty: 60 | Fill #1

## 2019-07-08 MED FILL — OMEPRAZOLE 40 MG CPDR: 40 | 90 days supply | Qty: 90 | Fill #0

## 2019-07-16 DIAGNOSIS — Z01419 Encounter for gynecological examination (general) (routine) without abnormal findings: Secondary | ICD-10-CM | POA: Diagnosis not present

## 2019-07-16 DIAGNOSIS — Z124 Encounter for screening for malignant neoplasm of cervix: Secondary | ICD-10-CM | POA: Diagnosis not present

## 2019-07-16 MED FILL — MOMETASONE FUROATE 0.1 % CR: 0.1 | 10 days supply | Qty: 45 | Fill #0

## 2019-08-15 ENCOUNTER — Other Ambulatory Visit: Payer: Self-pay | Admitting: Internal Medicine

## 2019-08-16 MED FILL — HYDROCHLOROTHIAZIDE 12.5 MG: 12.5 | 90 days supply | Qty: 90 | Fill #0

## 2019-08-28 MED FILL — BREO ELLIPTA 100-25 MCG INH: 100-25 | 30 days supply | Qty: 60 | Fill #2

## 2019-09-05 NOTE — Patient Instructions (Addendum)
Blood work was ordered.    All other Health Maintenance issues reviewed.   All recommended immunizations and age-appropriate screenings are up-to-date or discussed.  No immunization administered today.   Medications reviewed and updated.  Changes include :   none     Please followup in 1 year    Health Maintenance, Female Adopting a healthy lifestyle and getting preventive care are important in promoting health and wellness. Ask your health care provider about:  The right schedule for you to have regular tests and exams.  Things you can do on your own to prevent diseases and keep yourself healthy. What should I know about diet, weight, and exercise? Eat a healthy diet   Eat a diet that includes plenty of vegetables, fruits, low-fat dairy products, and lean protein.  Do not eat a lot of foods that are high in solid fats, added sugars, or sodium. Maintain a healthy weight Body mass index (BMI) is used to identify weight problems. It estimates body fat based on height and weight. Your health care provider can help determine your BMI and help you achieve or maintain a healthy weight. Get regular exercise Get regular exercise. This is one of the most important things you can do for your health. Most adults should:  Exercise for at least 150 minutes each week. The exercise should increase your heart rate and make you sweat (moderate-intensity exercise).  Do strengthening exercises at least twice a week. This is in addition to the moderate-intensity exercise.  Spend less time sitting. Even light physical activity can be beneficial. Watch cholesterol and blood lipids Have your blood tested for lipids and cholesterol at 64 years of age, then have this test every 5 years. Have your cholesterol levels checked more often if:  Your lipid or cholesterol levels are high.  You are older than 64 years of age.  You are at high risk for heart disease. What should I know about cancer  screening? Depending on your health history and family history, you may need to have cancer screening at various ages. This may include screening for:  Breast cancer.  Cervical cancer.  Colorectal cancer.  Skin cancer.  Lung cancer. What should I know about heart disease, diabetes, and high blood pressure? Blood pressure and heart disease  High blood pressure causes heart disease and increases the risk of stroke. This is more likely to develop in people who have high blood pressure readings, are of African descent, or are overweight.  Have your blood pressure checked: ? Every 3-5 years if you are 18-39 years of age. ? Every year if you are 40 years old or older. Diabetes Have regular diabetes screenings. This checks your fasting blood sugar level. Have the screening done:  Once every three years after age 40 if you are at a normal weight and have a low risk for diabetes.  More often and at a younger age if you are overweight or have a high risk for diabetes. What should I know about preventing infection? Hepatitis B If you have a higher risk for hepatitis B, you should be screened for this virus. Talk with your health care provider to find out if you are at risk for hepatitis B infection. Hepatitis C Testing is recommended for:  Everyone born from 1945 through 1965.  Anyone with known risk factors for hepatitis C. Sexually transmitted infections (STIs)  Get screened for STIs, including gonorrhea and chlamydia, if: ? You are sexually active and are younger than 64 years   of age. ? You are older than 64 years of age and your health care provider tells you that you are at risk for this type of infection. ? Your sexual activity has changed since you were last screened, and you are at increased risk for chlamydia or gonorrhea. Ask your health care provider if you are at risk.  Ask your health care provider about whether you are at high risk for HIV. Your health care provider may  recommend a prescription medicine to help prevent HIV infection. If you choose to take medicine to prevent HIV, you should first get tested for HIV. You should then be tested every 3 months for as long as you are taking the medicine. Pregnancy  If you are about to stop having your period (premenopausal) and you may become pregnant, seek counseling before you get pregnant.  Take 400 to 800 micrograms (mcg) of folic acid every day if you become pregnant.  Ask for birth control (contraception) if you want to prevent pregnancy. Osteoporosis and menopause Osteoporosis is a disease in which the bones lose minerals and strength with aging. This can result in bone fractures. If you are 65 years old or older, or if you are at risk for osteoporosis and fractures, ask your health care provider if you should:  Be screened for bone loss.  Take a calcium or vitamin D supplement to lower your risk of fractures.  Be given hormone replacement therapy (HRT) to treat symptoms of menopause. Follow these instructions at home: Lifestyle  Do not use any products that contain nicotine or tobacco, such as cigarettes, e-cigarettes, and chewing tobacco. If you need help quitting, ask your health care provider.  Do not use street drugs.  Do not share needles.  Ask your health care provider for help if you need support or information about quitting drugs. Alcohol use  Do not drink alcohol if: ? Your health care provider tells you not to drink. ? You are pregnant, may be pregnant, or are planning to become pregnant.  If you drink alcohol: ? Limit how much you use to 0-1 drink a day. ? Limit intake if you are breastfeeding.  Be aware of how much alcohol is in your drink. In the [place] one drink equals one 12 oz bottle of beer (355 mL), one 5 oz glass of wine (148 mL), or one 1 oz glass of hard liquor (44 mL). General instructions  Schedule regular health, dental, and eye exams.  Stay current with your  vaccines.  Tell your health care provider if: ? You often feel depressed. ? You have ever been abused or do not feel safe at home. Summary  Adopting a healthy lifestyle and getting preventive care are important in promoting health and wellness.  Follow your health care provider's instructions about healthy diet, exercising, and getting tested or screened for diseases.  Follow your health care provider's instructions on monitoring your cholesterol and blood pressure. This information is not intended to replace advice given to you by your health care provider. Make sure you discuss any questions you have with your health care provider. Document Revised: 02/21/2018 Document Reviewed: 02/21/2018 Elsevier Patient Education  2020 [employer]  

## 2019-09-05 NOTE — Progress Notes (Signed)
Subjective:    Patient ID: Dana Mccoy, female    DOB: 1955-07-11, 64 y.o.   MRN: [id number]  HPI She is here for a physical exam.   She feels off.  She feels very fatigued.  Her mind feels like it is freezing at times - like she can not remember anything.  She has difficulty concentrating at times.  She knows what she wants to say but can't say it. Brief episodes of numbness in face or mouth - does not last ( just an area of her face - not the whole face) .  She has tremors in her hands.  She notices it on occasion.    Sleep study last year - no apnea.  She eats too many carbs.  She eats meat about 2/week.  She is trying to increase her veges.    She wears bifocals and sometimes her vision is off and she may trip.  At home she may turn quickly and trip or fall over.    Medications and allergies reviewed with patient and updated if appropriate.  Patient Active Problem List   Diagnosis Date Noted  . Numbness and tingling 09/06/2019  . Low iron 09/06/2019  . Tremor 09/06/2019  . Restless leg syndrome 07/27/2018  . Chronic pain of left knee 04/17/2018  . Right low back pain 04/17/2018  . Fatigue 08/21/2017  . Bradycardia 08/21/2017  . Atypical chest pain 08/21/2017  . Cough 09/17/2015  . Anxiety 12/08/2013  . Obesity (BMI 30-39.9) 08/02/2013  . Rhinitis 08/02/2013  . Cervical stenosis of spine 04/26/2011  . Hyperlipidemia 06/05/2007  . Reactive airway disease 06/05/2007  . GERD 06/05/2007    Current Outpatient Medications on File Prior to Visit  Medication Sig Dispense Refill  . acetaminophen (TYLENOL) 500 MG tablet Take 500 mg by mouth every 6 (six) hours as needed.    . ACYCLOVIR PO Take 500 mg by mouth daily. Patient states she has a 500mg  tab (not an option in epic), filled by another MD    . albuterol (VENTOLIN HFA) 108 (90 Base) MCG/ACT inhaler INHALE 2 PUFFS BY MOUTH EVERY 4 TO 6 HOURS AS NEEDED FOR COUGH OR WHEEZE *MAY USE 10-20 MIN PRIOR TO EXERCISE 18 g  3  . Ascorbic Acid (VITAMIN C PO) Take 1 tablet by mouth daily. 1000mg     . BREO ELLIPTA 100-25 MCG/INH AEPB INHALE 1 PUFF BY MOUTH DAILY AS NEEDED. 60 each 3  . Ferrous Sulfate (IRON PO) Take 10 mg by mouth. Patient takes 2 daily(gummies)    . FLUoxetine (PROZAC) 20 MG tablet Take 1 tablet (20 mg total) by mouth daily. 90 tablet 3  . fluticasone (VERAMYST) 27.5 MCG/SPRAY nasal spray Place 2 sprays into the nose daily.    . hydrochlorothiazide (MICROZIDE) 12.5 MG capsule TAKE 1 CAPSULE BY MOUTH DAILY. 90 capsule 0  . Loperamide HCl (LOPERAMIDE A-D PO) Take by mouth. Patient takes 3xs a week as needed    . loratadine (CLARITIN) 10 MG tablet Take 10 mg by mouth daily.    . mometasone (ELOCON) 0.1 % cream mometasone 0.1 % topical cream  APPLY A THIN LAYER TO THE AFFECTED AREA OF THE SKIN DAILY    . Multiple Vitamin (MULTIVITAMIN) tablet Take 1 tablet by mouth daily.    . naproxen sodium (ALEVE) 220 MG tablet Take 220 mg by mouth as needed.    Marland Kitchen omeprazole (PRILOSEC) 40 MG capsule TAKE 1 CAPSULE BY MOUTH DAILY. 90 capsule 0  .  phenylephrine (SUDAFED PE) 10 MG TABS tablet Take 10 mg by mouth as needed.    . Probiotic Product (ALIGN PO) Take by mouth daily.    Marland Kitchen triamcinolone cream (KENALOG) 0.1 % Apply 1 application topically 2 (two) times daily. 30 g 2  . valACYclovir (VALTREX) 500 MG tablet Take 1 tablet (500 mg total) by mouth daily. 90 tablet 3   No current facility-administered medications on file prior to visit.    Past Medical History:  Diagnosis Date  . GERD (gastroesophageal reflux disease)   . Hyperlipidemia   . Hypertension   . Reactive airway disease    post CAP    Past Surgical History:  Procedure Laterality Date  . CARDIAC CATHETERIZATION  1997   negative  . COLONOSCOPY  2007   Dr Cristina Gong  . DILATION AND CURETTAGE OF UTERUS    . UPPER GI ENDOSCOPY     X 2  . WISDOM TOOTH EXTRACTION      Social History   Socioeconomic History  . Marital status: Divorced     Spouse name: Not on file  . Number of children: Not on file  . Years of education: Not on file  . Highest education level: Not on file  Occupational History  . Not on file  Tobacco Use  . Smoking status: Former Smoker    Quit date: 03/14/1984    Years since quitting: 35.5  . Smokeless tobacco: Never Used  . Tobacco comment: smoked 1975-1986, up to 1.5 ppd  Substance and Sexual Activity  . Alcohol use: No  . Drug use: No  . Sexual activity: Not on file  Other Topics Concern  . Not on file  Social History Narrative   Case manager - [employer]      Exercise: 2-4 times a week   Social Determinants of Health   Financial Resource Strain:   . Difficulty of Paying Living Expenses:   Food Insecurity:   . Worried About Charity fundraiser in the Last Year:   . Arboriculturist in the Last Year:   Transportation Needs:   . Film/video editor (Medical):   Marland Kitchen Lack of Transportation (Non-Medical):   Physical Activity:   . Days of Exercise per Week:   . Minutes of Exercise per Session:   Stress:   . Feeling of Stress :   Social Connections:   . Frequency of Communication with Friends and Family:   . Frequency of Social Gatherings with Friends and Family:   . Attends Religious Services:   . Active Member of Clubs or Organizations:   . Attends Archivist Meetings:   Marland Kitchen Marital Status:     Family History  Adopted: Yes    Review of Systems  Constitutional: Positive for fatigue (tired most days). Negative for chills and fever.  Eyes: Negative for visual disturbance.  Respiratory: Positive for cough (allergy related). Negative for shortness of breath and wheezing.   Cardiovascular: Positive for palpitations (occ) and leg swelling (at the end of the day). Negative for chest pain.  Gastrointestinal: Positive for nausea (occ). Negative for abdominal pain, blood in stool, constipation and diarrhea.  Genitourinary: Negative for dysuria and hematuria.  Musculoskeletal: Positive for  arthralgias (occ hip pain). Negative for back pain.  Skin: Negative for rash.  Neurological: Positive for tremors, light-headedness (sometimes), numbness (face, occ legs) and headaches.  Psychiatric/Behavioral: Negative for dysphoric mood. The patient is nervous/anxious (work related).        Objective:  Vitals:   09/06/19 1339  BP: 122/76  Pulse: (!) 56  Temp: 98.1 F (36.7 C)  SpO2: 97%   Filed Weights   09/06/19 1339  Weight: 204 lb (92.5 kg)   Body mass index is 33.95 kg/m.  BP Readings from Last 3 Encounters:  09/06/19 122/76  08/07/18 124/78  07/27/18 118/76    Wt Readings from Last 3 Encounters:  09/06/19 204 lb (92.5 kg)  08/07/18 206 lb (93.4 kg)  07/27/18 206 lb (93.4 kg)     Physical Exam Constitutional: She appears well-developed and well-nourished. No distress.  HENT:  Head: Normocephalic and atraumatic.  Right Ear: External ear normal. Normal ear canal and TM Left Ear: External ear normal.  Normal ear canal and TM Mouth/Throat: Oropharynx is clear and moist.  Eyes: Conjunctivae and EOM are normal.  Neck: Neck supple. No tracheal deviation present. No thyromegaly present.  No carotid bruit  Cardiovascular: Normal rate, regular rhythm and normal heart sounds.   No murmur heard.  No edema. Pulmonary/Chest: Effort normal and breath sounds normal. No respiratory distress. She has no wheezes. She has no rales.  Breast: deferred   Abdominal: Soft. She exhibits no distension. There is no tenderness.  Lymphadenopathy: She has no cervical adenopathy.  Neuro: normal sensation and strength all extremities.  Gait is normal. CN grossly intact. Skin: Skin is warm and dry. She is not diaphoretic.  Psychiatric: She has a normal mood and affect. Her behavior is normal.   The 10-year ASCVD risk score Mikey Bussing DC Brooke Bonito., et al., 2013) is: 7.2%   Values used to calculate the score:     Age: 89 years     Sex: Female     Is Non-Hispanic African American: No      Diabetic: No     Tobacco smoker: No     Systolic Blood Pressure: 031 mmHg     Is BP treated: Yes     HDL Cholesterol: 54.3 mg/dL     Total Cholesterol: 261 mg/dL      Assessment & Plan:   Physical exam: Screening blood work    ordered Immunizations  ? Tdap, discussed shingrix, covid Colonoscopy  Up to date  Mammogram  Up to date  Gyn  Up to date  Dexa  Up to date  Eye exams  Up to date  Exercise  Walks dogs Weight  Obese - encouraged weight loss Substance abuse  none  See Problem List for Assessment and Plan of chronic medical problems.   This visit occurred during the SARS-CoV-2 public health emergency.  Safety protocols were in place, including screening questions prior to the visit, additional usage of staff PPE, and extensive cleaning of exam room while observing appropriate contact time as indicated for disinfecting solutions.

## 2019-09-06 ENCOUNTER — Encounter: Payer: Self-pay | Admitting: Internal Medicine

## 2019-09-06 ENCOUNTER — Other Ambulatory Visit: Payer: Self-pay

## 2019-09-06 ENCOUNTER — Ambulatory Visit (INDEPENDENT_AMBULATORY_CARE_PROVIDER_SITE_OTHER): Payer: 59 | Admitting: Internal Medicine

## 2019-09-06 VITALS — BP 122/76 | HR 56 | Temp 98.1°F | Ht 65.0 in | Wt 204.0 lb

## 2019-09-06 DIAGNOSIS — R5383 Other fatigue: Secondary | ICD-10-CM

## 2019-09-06 DIAGNOSIS — R202 Paresthesia of skin: Secondary | ICD-10-CM

## 2019-09-06 DIAGNOSIS — G2581 Restless legs syndrome: Secondary | ICD-10-CM | POA: Diagnosis not present

## 2019-09-06 DIAGNOSIS — K219 Gastro-esophageal reflux disease without esophagitis: Secondary | ICD-10-CM | POA: Diagnosis not present

## 2019-09-06 DIAGNOSIS — R251 Tremor, unspecified: Secondary | ICD-10-CM

## 2019-09-06 DIAGNOSIS — E611 Iron deficiency: Secondary | ICD-10-CM

## 2019-09-06 DIAGNOSIS — R200 Anesthesia of skin: Secondary | ICD-10-CM | POA: Diagnosis not present

## 2019-09-06 DIAGNOSIS — Z0000 Encounter for general adult medical examination without abnormal findings: Secondary | ICD-10-CM

## 2019-09-06 DIAGNOSIS — E7849 Other hyperlipidemia: Secondary | ICD-10-CM

## 2019-09-06 DIAGNOSIS — J4541 Moderate persistent asthma with (acute) exacerbation: Secondary | ICD-10-CM | POA: Diagnosis not present

## 2019-09-06 DIAGNOSIS — F419 Anxiety disorder, unspecified: Secondary | ICD-10-CM

## 2019-09-06 LAB — LIPID PANEL
Cholesterol: 240 mg/dL — ABNORMAL HIGH (ref 0–200)
HDL: 52 mg/dL (ref >39.00)
NonHDL: 187.59
Total CHOL/HDL Ratio: 5
Triglycerides: 235 mg/dL — ABNORMAL HIGH (ref 0.0–149.0)
VLDL: 47 mg/dL — ABNORMAL HIGH (ref 0.0–40.0)

## 2019-09-06 LAB — T3, FREE: T3, Free: 3 pg/mL (ref 2.3–4.2)

## 2019-09-06 LAB — CBC WITH DIFFERENTIAL/PLATELET
Basophils Absolute: 0 10*3/uL (ref 0.0–0.1)
Basophils Relative: 0.5 % (ref 0.0–3.0)
Eosinophils Absolute: 0.2 10*3/uL (ref 0.0–0.7)
Eosinophils Relative: 2.4 % (ref 0.0–5.0)
HCT: 42.1 % (ref 36.0–46.0)
Hemoglobin: 14.1 g/dL (ref 12.0–15.0)
Lymphocytes Relative: 23.3 % (ref 12.0–46.0)
Lymphs Abs: 1.7 10*3/uL (ref 0.7–4.0)
MCHC: 33.5 g/dL (ref 30.0–36.0)
MCV: 88.4 fl (ref 78.0–100.0)
Monocytes Absolute: 0.7 10*3/uL (ref 0.1–1.0)
Monocytes Relative: 9.4 % (ref 3.0–12.0)
Neutro Abs: 4.6 10*3/uL (ref 1.4–7.7)
Neutrophils Relative %: 64.4 % (ref 43.0–77.0)
Platelets: 307 10*3/uL (ref 150.0–400.0)
RBC: 4.76 Mil/uL (ref 3.87–5.11)
RDW: 13.3 % (ref 11.5–15.5)
WBC: 7.1 10*3/uL (ref 4.0–10.5)

## 2019-09-06 LAB — COMPREHENSIVE METABOLIC PANEL
ALT: 21 U/L (ref 0–35)
AST: 28 U/L (ref 0–37)
Albumin: 4.3 g/dL (ref 3.5–5.2)
Alkaline Phosphatase: 105 U/L (ref 39–117)
BUN: 21 mg/dL (ref 6–23)
CO2: 30 mEq/L (ref 19–32)
Calcium: 10.1 mg/dL (ref 8.4–10.5)
Chloride: 102 mEq/L (ref 96–112)
Creatinine, Ser: 0.96 mg/dL (ref 0.40–1.20)
GFR: 58.46 mL/min — ABNORMAL LOW (ref >60.00)
Glucose, Bld: 74 mg/dL (ref 70–99)
Potassium: 4.1 mEq/L (ref 3.5–5.1)
Sodium: 141 mEq/L (ref 135–145)
Total Bilirubin: 0.4 mg/dL (ref 0.2–1.2)
Total Protein: 7 g/dL (ref 6.0–8.3)

## 2019-09-06 LAB — T4, FREE: Free T4: 0.66 ng/dL (ref 0.60–1.60)

## 2019-09-06 LAB — LDL CHOLESTEROL, DIRECT: Direct LDL: 150 mg/dL

## 2019-09-06 LAB — VITAMIN B12: Vitamin B-12: 1115 pg/mL — ABNORMAL HIGH (ref 211–911)

## 2019-09-06 LAB — TSH: TSH: 2.89 u[IU]/mL (ref 0.35–4.50)

## 2019-09-06 NOTE — Assessment & Plan Note (Signed)
Chronic Check lipids

## 2019-09-06 NOTE — Assessment & Plan Note (Signed)
?   Essential Discussed neuro eval - she will think about it if it gets worse

## 2019-09-06 NOTE — Assessment & Plan Note (Signed)
Chronic, Controlled  Continue inhalers

## 2019-09-06 NOTE — Assessment & Plan Note (Signed)
Chronic Has occasional symptoms

## 2019-09-06 NOTE — Assessment & Plan Note (Signed)
Had increased gerd recently She takes the omeprazole She restarted the align Symptoms have improved

## 2019-09-06 NOTE — Assessment & Plan Note (Signed)
Chronic ? Controlled - some increased anxiety due to new job Discussed we can increase prozac if needed - some of her symptoms may be anxiety related Continue current dose of medication prozac 20 mg daily

## 2019-09-06 NOTE — Assessment & Plan Note (Signed)
H/o low iron Check iron panel - ? cause of fatigue and contributing to RLS

## 2019-09-06 NOTE — Assessment & Plan Note (Signed)
Not new ? Related to low iron - will check  Basic labs to r/o anemia Had sleep test last year - no OSA

## 2019-09-06 NOTE — Assessment & Plan Note (Signed)
Tingling in legs and intermittent numbness in face ? Cause Will check basic labs, B12 Deferred neuro evaluation at this time, but if her symptoms continue she will let me know so I can refer her

## 2019-09-07 LAB — IRON,TIBC AND FERRITIN PANEL
%SAT: 15 % (calc) — ABNORMAL LOW (ref 16–45)
Ferritin: 16 ng/mL (ref 16–288)
Iron: 62 ug/dL (ref 45–160)
TIBC: 401 mcg/dL (calc) (ref 250–450)

## 2019-09-13 ENCOUNTER — Other Ambulatory Visit: Payer: Self-pay | Admitting: Internal Medicine

## 2019-09-13 MED FILL — FLUoxetine HCL 20 MG CAPS: 20 | 90 days supply | Qty: 90 | Fill #1

## 2019-09-13 MED FILL — VALACYCLOVIR HCL 500 MG TAB: 500 | 90 days supply | Qty: 90 | Fill #0

## 2019-09-16 ENCOUNTER — Encounter: Payer: Self-pay | Admitting: Internal Medicine

## 2019-09-16 DIAGNOSIS — R2681 Unsteadiness on feet: Secondary | ICD-10-CM

## 2019-09-16 DIAGNOSIS — R251 Tremor, unspecified: Secondary | ICD-10-CM

## 2019-09-24 ENCOUNTER — Other Ambulatory Visit: Payer: Self-pay

## 2019-09-24 ENCOUNTER — Encounter: Payer: Self-pay | Admitting: Internal Medicine

## 2019-09-24 ENCOUNTER — Ambulatory Visit: Payer: 59 | Admitting: Internal Medicine

## 2019-09-24 VITALS — BP 142/82 | HR 65 | Temp 97.9°F | Resp 16 | Ht 65.0 in | Wt 201.0 lb

## 2019-09-24 DIAGNOSIS — M5030 Other cervical disc degeneration, unspecified cervical region: Secondary | ICD-10-CM | POA: Diagnosis not present

## 2019-09-24 DIAGNOSIS — M4802 Spinal stenosis, cervical region: Secondary | ICD-10-CM

## 2019-09-24 MED ORDER — TIZANIDINE HCL 2 MG PO CAPS: 2.0000 mg | ORAL_CAPSULE | Freq: Three times a day (TID) | ORAL | 0 refills | Status: DC | PRN

## 2019-09-24 MED ORDER — RELAFEN DS 1000 MG PO TABS: 1.0000 | ORAL_TABLET | Freq: Every day | ORAL | 0 refills | Status: DC | PRN

## 2019-09-24 MED FILL — TIZANIDINE HCL 2 MG CAPS: 2 | 90 days supply | Qty: 270 | Fill #0

## 2019-09-24 NOTE — Patient Instructions (Signed)
Degenerative Disk Disease  Degenerative disk disease is a condition caused by changes that occur in the spinal disks as a person ages. Spinal disks are soft and compressible disks located between the bones of your spine (vertebrae). These disks act like shock absorbers. Degenerative disk disease can affect the whole spine. However, the neck and lower back are most often affected. Many changes can occur in the spinal disks with aging, such as:  The spinal disks may dry and shrink.  Small tears may occur in the tough, outer covering of the disk (annulus).  The disk space may become smaller due to loss of water.  Abnormal growths in the bone (spurs) may occur. This can put pressure on the nerve roots exiting the spinal canal, causing pain.  The spinal canal may become narrowed. What are the causes? This condition may be caused by:  Normal degeneration with age.  Injuries.  Certain activities and sports that cause damage. What increases the risk? The following factors may make you more likely to develop this condition:  Being overweight.  Having a family history of degenerative disk disease.  Smoking.  Sudden injury.  Doing work that requires heavy lifting. What are the signs or symptoms? Symptoms of this condition include:  Pain that varies in intensity. Some people have no pain, while others have severe pain. The location of the pain depends on the part of your backbone that is affected. You may have: ? Pain in your neck or arm if a disk in your neck area is affected. ? Pain in your back, buttocks, or legs if a disk in your lower back is affected.  Pain that becomes worse while bending or reaching up, or with twisting movements.  Pain that may start gradually and then get worse as time passes. It may also start after a major or minor injury.  Numbness or tingling in the arms or legs. How is this diagnosed? This condition may be diagnosed based on:  Your symptoms and  medical history.  A physical exam.  Imaging tests, including: ? An X-ray of the spine. ? MRI. How is this treated? This condition may be treated with:  Medicines.  Rehabilitation exercises. These activities aim to strengthen muscles in your back and abdomen to better support your spine. If treatments do not help to relieve your symptoms or you have severe pain, you may need surgery. Follow these instructions at home: Medicines  Take over-the-counter and prescription medicines only as told by your health care provider.  Do not drive or use heavy machinery while taking prescription pain medicine.  If you are taking prescription pain medicine, take actions to prevent or treat constipation. Your health care provider may recommend that you: ? Drink enough fluid to keep your urine pale yellow. ? Eat foods that are high in fiber, such as fresh fruits and vegetables, whole grains, and beans. ? Limit foods that are high in fat and processed sugars, such as fried or sweet foods. ? Take an over-the-counter or prescription medicine for constipation. Activity  Rest as told by your health care provider.  Ask your health care provider what activities are safe for you. Return to your normal activities as directed.  Avoid sitting for a long time without moving. Get up to take short walks every 1-2 hours. This is important to improve blood flow and breathing. Ask for help if you feel weak or unsteady.  Perform relaxation exercises as told by your health care provider.  Maintain good posture.    Do not lift anything that is heavier than 10 lb (4.5 kg), or the limit that you are told, until your health care provider says that it is safe.  Follow proper lifting and walking techniques as told by your health care provider. Managing pain, stiffness, and swelling   If directed, put ice on the painful area. Icing can help to relieve pain. ? Put ice in a plastic bag. ? Place a towel between your  skin and the bag. ? Leave the ice on for 20 minutes, 2-3 times a day.  If directed, apply heat to the painful area as often as told by your health care provider. Heat can reduce the stiffness of your muscles. Use the heat source that your health care provider recommends, such as a moist heat pack or a heating pad. ? Place a towel between your skin and the heat source. ? Leave the heat on for 20-30 minutes. ? Remove the heat if your skin turns bright red. This is especially important if you are unable to feel pain, heat, or cold. You may have a greater risk of getting burned. General instructions  Change your sitting, standing, and sleeping habits as told by your health care provider.  Avoid sitting in the same position for long periods of time. Change positions frequently.  Lose weight or maintain a healthy weight as told by your health care provider.  Do not use any products that contain nicotine or tobacco, such as cigarettes and e-cigarettes. If you need help quitting, ask your health care provider.  Wear supportive footwear.  Keep all follow-up visits as told by your health care provider. This is important. This may include visits for physical therapy. Contact a health care provider if you:  Have pain that does not go away within 1-4 weeks.  Lose your appetite.  Lose weight without trying. Get help right away if you:  Have severe pain.  Notice weakness in your arms, hands, or legs.  Begin to lose control of your bladder or bowel movements.  Have fevers or night sweats. Summary  Degenerative disk disease is a condition caused by changes that occur in the spinal disks as a person ages.  Degenerative disk disease can affect the whole spine. However, the neck and lower back are most often affected.  Take over-the-counter and prescription medicines only as told by your health care provider. This information is not intended to replace advice given to you by your health care  provider. Make sure you discuss any questions you have with your health care provider. Document Revised: 02/23/2017 Document Reviewed: 02/23/2017 Elsevier Patient Education  2020 [employer]  

## 2019-09-24 NOTE — Progress Notes (Signed)
Subjective:  Patient ID: Dana Mccoy, female    DOB: 1955/08/12  Age: 64 y.o. MRN: [id number]  CC: Neck Pain  This visit occurred during the SARS-CoV-2 public health emergency.  Safety protocols were in place, including screening questions prior to the visit, additional usage of staff PPE, and extensive cleaning of exam room while observing appropriate contact time as indicated for disinfecting solutions.    HPI Dana Mccoy presents for f/up - She complains of chronic neck pain that is somewhat worsened over the last week with no preceding trauma or injury.  She says the neck pain radiates towards her left shoulder and she has numbness that extends to the left shoulder.  She describes the pain as a burning sensation with spasms.  She has tried to control the pain with naproxen but it caused some stomach upset.  She denies paresthesias in her hands or upper arms.  Outpatient Medications Prior to Visit  Medication Sig Dispense Refill  . acetaminophen (TYLENOL) 500 MG tablet Take 500 mg by mouth every 6 (six) hours as needed.    Marland Kitchen albuterol (VENTOLIN HFA) 108 (90 Base) MCG/ACT inhaler INHALE 2 PUFFS BY MOUTH EVERY 4 TO 6 HOURS AS NEEDED FOR COUGH OR WHEEZE *MAY USE 10-20 MIN PRIOR TO EXERCISE 18 g 3  . Ascorbic Acid (VITAMIN C PO) Take 1 tablet by mouth daily. 1000mg     . BREO ELLIPTA 100-25 MCG/INH AEPB INHALE 1 PUFF BY MOUTH DAILY AS NEEDED. 60 each 3  . Ferrous Sulfate (IRON PO) Take 10 mg by mouth. Patient takes 2 daily(gummies)    . FLUoxetine (PROZAC) 20 MG tablet Take 1 tablet (20 mg total) by mouth daily. 90 tablet 3  . fluticasone (VERAMYST) 27.5 MCG/SPRAY nasal spray Place 2 sprays into the nose daily.    . hydrochlorothiazide (MICROZIDE) 12.5 MG capsule TAKE 1 CAPSULE BY MOUTH DAILY. 90 capsule 0  . Loperamide HCl (LOPERAMIDE A-D PO) Take by mouth. Patient takes 3xs a week as needed    . mometasone (ELOCON) 0.1 % cream mometasone 0.1 % topical cream  APPLY A  THIN LAYER TO THE AFFECTED AREA OF THE SKIN DAILY    . Multiple Vitamin (MULTIVITAMIN) tablet Take 1 tablet by mouth daily.    Marland Kitchen omeprazole (PRILOSEC) 40 MG capsule TAKE 1 CAPSULE BY MOUTH DAILY. 90 capsule 0  . phenylephrine (SUDAFED PE) 10 MG TABS tablet Take 10 mg by mouth as needed.    . Probiotic Product (ALIGN PO) Take by mouth daily.    Marland Kitchen triamcinolone cream (KENALOG) 0.1 % Apply 1 application topically 2 (two) times daily. 30 g 2  . valACYclovir (VALTREX) 500 MG tablet TAKE 1 TABLET BY MOUTH DAILY. 90 tablet 3  . ACYCLOVIR PO Take 500 mg by mouth daily. Patient states she has a 500mg  tab (not an option in epic), filled by another MD    . loratadine (CLARITIN) 10 MG tablet Take 10 mg by mouth daily.    . naproxen sodium (ALEVE) 220 MG tablet Take 220 mg by mouth as needed.     No facility-administered medications prior to visit.    ROS Review of Systems  Constitutional: Negative.  Negative for appetite change, chills, fatigue and fever.  HENT: Negative for sore throat and trouble swallowing.   Eyes: Negative.   Respiratory: Negative for cough, chest tightness, shortness of breath and wheezing.   Cardiovascular: Negative for chest pain, palpitations and leg swelling.  Gastrointestinal: Negative for abdominal pain, constipation, diarrhea,  nausea and vomiting.  Endocrine: Negative.   Musculoskeletal: Positive for arthralgias, back pain and neck pain. Negative for myalgias.  Skin: Negative.   Neurological: Positive for numbness. Negative for dizziness, tremors and weakness.  Hematological: Negative for adenopathy. Does not bruise/bleed easily.    Objective:  BP (!) 142/82 (BP Location: Left Arm, Patient Position: Sitting, Cuff Size: Normal)   Pulse 65   Temp 97.9 F (36.6 C) (Oral)   Resp 16   Ht 5\' 5"  (1.651 m)   Wt 201 lb (91.2 kg)   SpO2 96%   BMI 33.45 kg/m   BP Readings from Last 3 Encounters:  09/24/19 (!) 142/82  09/06/19 122/76  08/07/18 124/78    Wt Readings  from Last 3 Encounters:  09/24/19 201 lb (91.2 kg)  09/06/19 204 lb (92.5 kg)  08/07/18 206 lb (93.4 kg)    Physical Exam Vitals reviewed.  HENT:     Nose: Nose normal.     Mouth/Throat:     Mouth: Mucous membranes are moist.  Eyes:     General: No scleral icterus.    Conjunctiva/sclera: Conjunctivae normal.  Cardiovascular:     Rate and Rhythm: Normal rate and regular rhythm.     Heart sounds: No murmur heard.   Pulmonary:     Effort: Pulmonary effort is normal.     Breath sounds: No wheezing, rhonchi or rales.  Abdominal:     General: Abdomen is flat. Bowel sounds are normal. There is no distension.     Palpations: Abdomen is soft. There is no hepatomegaly, splenomegaly or mass.     Tenderness: There is no abdominal tenderness.  Musculoskeletal:     Cervical back: Neck supple. No signs of trauma, rigidity or tenderness. No pain with movement. Decreased range of motion.  Skin:    General: Skin is warm and dry.  Neurological:     Mental Status: She is alert.     Cranial Nerves: Cranial nerves are intact.     Sensory: Sensation is intact.     Motor: Motor function is intact. No weakness or atrophy.     Coordination: Coordination is intact.     Deep Tendon Reflexes: Reflexes normal.     Reflex Scores:      Tricep reflexes are 1+ on the right side and 1+ on the left side.      Bicep reflexes are 2+ on the right side and 2+ on the left side.      Brachioradialis reflexes are 1+ on the right side and 1+ on the left side.      Patellar reflexes are 2+ on the right side and 2+ on the left side.      Achilles reflexes are 1+ on the right side and 1+ on the left side.    Lab Results  Component Value Date   WBC 7.1 09/06/2019   HGB 14.1 09/06/2019   HCT 42.1 09/06/2019   PLT 307.0 09/06/2019   GLUCOSE 74 09/06/2019   CHOL 240 (H) 09/06/2019   TRIG 235.0 (H) 09/06/2019   HDL 52.00 09/06/2019   LDLDIRECT 150.0 09/06/2019   LDLCALC 169 (H) 07/27/2018   ALT 21 09/06/2019    AST 28 09/06/2019   NA 141 09/06/2019   K 4.1 09/06/2019   CL 102 09/06/2019   CREATININE 0.96 09/06/2019   BUN 21 09/06/2019   CO2 30 09/06/2019   TSH 2.89 09/06/2019   HGBA1C 5.5 09/17/2015    MM 3D SCREEN BREAST BILATERAL  Result Date: 04/17/2019 CLINICAL DATA:  Screening. EXAM: DIGITAL SCREENING BILATERAL MAMMOGRAM WITH TOMO AND CAD COMPARISON:  Previous exam(s). ACR Breast Density Category a: The breast tissue is almost entirely fatty. FINDINGS: There are no findings suspicious for malignancy. Images were processed with CAD. IMPRESSION: No mammographic evidence of malignancy. A result letter of this screening mammogram will be mailed directly to the patient. RECOMMENDATION: Screening mammogram in one year. (Code:SM-B-01Y) BI-RADS CATEGORY  1: Negative. Electronically Signed   By: Ammie Ferrier M.D.   On: 04/17/2019 16:48    Assessment & Plan:   Wai was seen today for neck pain.  Diagnoses and all orders for this visit:  Cervical stenosis of spine- She has neck pain that radiates towards her left shoulder with numbness in the area but she has no paresthesias distally in the upper extremities.  On examination she is neurologically intact.  She has prior films that show spinal stenosis and degenerative disc disease.  I recommended that she switch to an anti-inflammatory that is safe for the upper GI tract and to try muscle relaxer as well. -     Nabumetone (RELAFEN DS) 1000 MG TABS; Take 1 tablet by mouth daily as needed. -     tizanidine (ZANAFLEX) 2 MG capsule; Take 1 capsule (2 mg total) by mouth 3 (three) times daily as needed for muscle spasms.  DDD (degenerative disc disease), cervical- See above -     Nabumetone (RELAFEN DS) 1000 MG TABS; Take 1 tablet by mouth daily as needed. -     tizanidine (ZANAFLEX) 2 MG capsule; Take 1 capsule (2 mg total) by mouth 3 (three) times daily as needed for muscle spasms.   I have discontinued Durward Parcel ACYCLOVIR PO, naproxen  sodium, and loratadine. I am also having her start on Relafen DS and tizanidine. Additionally, I am having her maintain her multivitamin, Ascorbic Acid (VITAMIN C PO), phenylephrine, Probiotic Product (ALIGN PO), acetaminophen, triamcinolone cream, FLUoxetine, albuterol, Breo Ellipta, omeprazole, hydrochlorothiazide, fluticasone, Ferrous Sulfate (IRON PO), mometasone, Loperamide HCl (LOPERAMIDE A-D PO), and valACYclovir.  Meds ordered this encounter  Medications  . Nabumetone (RELAFEN DS) 1000 MG TABS    Sig: Take 1 tablet by mouth daily as needed.    Dispense:  8 tablet    Refill:  0  . tizanidine (ZANAFLEX) 2 MG capsule    Sig: Take 1 capsule (2 mg total) by mouth 3 (three) times daily as needed for muscle spasms.    Dispense:  270 capsule    Refill:  0     Follow-up: Return in about 3 months (around 12/25/2019).  Scarlette Calico, MD

## 2019-10-08 ENCOUNTER — Other Ambulatory Visit: Payer: Self-pay | Admitting: Internal Medicine

## 2019-10-08 MED FILL — OMEPRAZOLE 40 MG CPDR: 40 | 90 days supply | Qty: 90 | Fill #0

## 2019-10-08 MED FILL — BREO ELLIPTA 100-25 MCG INH: 100-25 | 30 days supply | Qty: 60 | Fill #3

## 2019-11-04 ENCOUNTER — Emergency Department (HOSPITAL_COMMUNITY)
Admission: EM | Admit: 2019-11-04 | Discharge: 2019-11-04 | Disposition: A | Discharge status: Home / Self Care | Payer: 59 | Attending: Emergency Medicine | Admitting: Emergency Medicine

## 2019-11-04 ENCOUNTER — Other Ambulatory Visit: Payer: Self-pay

## 2019-11-04 ENCOUNTER — Encounter (HOSPITAL_COMMUNITY): Payer: Self-pay

## 2019-11-04 DIAGNOSIS — Z7951 Long term (current) use of inhaled steroids: Secondary | ICD-10-CM | POA: Diagnosis not present

## 2019-11-04 DIAGNOSIS — I10 Essential (primary) hypertension: Secondary | ICD-10-CM | POA: Diagnosis not present

## 2019-11-04 DIAGNOSIS — Z87891 Personal history of nicotine dependence: Secondary | ICD-10-CM | POA: Insufficient documentation

## 2019-11-04 DIAGNOSIS — T7840XA Allergy, unspecified, initial encounter: Secondary | ICD-10-CM | POA: Insufficient documentation

## 2019-11-04 DIAGNOSIS — E785 Hyperlipidemia, unspecified: Secondary | ICD-10-CM | POA: Diagnosis not present

## 2019-11-04 DIAGNOSIS — R220 Localized swelling, mass and lump, head: Secondary | ICD-10-CM | POA: Diagnosis present

## 2019-11-04 LAB — CBC WITH DIFFERENTIAL/PLATELET
Abs Immature Granulocytes: 0.03 10*3/uL (ref 0.00–0.07)
Basophils Absolute: 0 10*3/uL (ref 0.0–0.1)
Basophils Relative: 0 %
Eosinophils Absolute: 0.1 10*3/uL (ref 0.0–0.5)
Eosinophils Relative: 2 %
HCT: 43.1 % (ref 36.0–46.0)
Hemoglobin: 13.8 g/dL (ref 12.0–15.0)
Immature Granulocytes: 0 %
Lymphocytes Relative: 17 %
Lymphs Abs: 1.2 10*3/uL (ref 0.7–4.0)
MCH: 29.1 pg (ref 26.0–34.0)
MCHC: 32 g/dL (ref 30.0–36.0)
MCV: 90.9 fL (ref 80.0–100.0)
Monocytes Absolute: 0.6 10*3/uL (ref 0.1–1.0)
Monocytes Relative: 8 %
Neutro Abs: 5.3 10*3/uL (ref 1.7–7.7)
Neutrophils Relative %: 73 %
Platelets: 285 10*3/uL (ref 150–400)
RBC: 4.74 MIL/uL (ref 3.87–5.11)
RDW: 13.2 % (ref 11.5–15.5)
WBC: 7.3 10*3/uL (ref 4.0–10.5)
nRBC: 0 % (ref 0.0–0.2)

## 2019-11-04 LAB — BASIC METABOLIC PANEL
Anion gap: 12 (ref 5–15)
BUN: 21 mg/dL (ref 8–23)
CO2: 27 mmol/L (ref 22–32)
Calcium: 9.7 mg/dL (ref 8.9–10.3)
Chloride: 103 mmol/L (ref 98–111)
Creatinine, Ser: 0.8 mg/dL (ref 0.44–1.00)
GFR calc Af Amer: >60 mL/min (ref >60)
GFR calc non Af Amer: >60 mL/min (ref >60)
Glucose, Bld: 99 mg/dL (ref 70–99)
Potassium: 3.5 mmol/L (ref 3.5–5.1)
Sodium: 142 mmol/L (ref 135–145)

## 2019-11-04 MED ORDER — PREDNISONE 20 MG PO TABS: 40.0000 mg | ORAL_TABLET | Freq: Every day | ORAL | 0 refills | Status: AC

## 2019-11-04 MED ORDER — METHYLPREDNISOLONE SODIUM SUCC 125 MG IJ SOLR
125.0000 mg | INTRAMUSCULAR | Status: AC
  Administered 2019-11-04: 125 mg via INTRAVENOUS
  Filled 2019-11-04: qty 2

## 2019-11-04 MED FILL — predniSONE 20 MG TABS: 20 | 3 days supply | Qty: 6 | Fill #0

## 2019-11-04 NOTE — Discharge Instructions (Signed)
As discussed, your evaluation today has been largely reassuring although no clear precipitant for your allergic reaction was demonstrated.  It is important that you monitor your condition carefully, and do not hesitate to return to the ED if you develop new, or concerning changes in your condition. For each of the next 2 days please use Benadryl, 25 mg, 3 times daily and Claritin once daily.  In addition, you have been prescribed a course of steroids.  Please take this to completion.

## 2019-11-04 NOTE — ED Provider Notes (Signed)
[facility] DEPT Provider Note   CSN: [id number] Arrival date & time: 11/04/19  1041     History Chief Complaint  Patient presents with  . Allergic Reaction    Dana Mccoy is a 64 y.o. female.  HPI    Patient presents concern of the tongue swelling. Without clear precipitant, about 1 hour prior to ED arrival the patient noticed tongue swelling.  There is associated lip tingling.  There is some difficulty swallowing, speaking, no dyspnea. Patient has no history of similar events/angioedema.  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  Subsequently, she took 50 mg Benadryl, and now presents for evaluation. Earlier in the day, with history of environmental allergies she took Claritin, as well as her other medications, which do not include lisinopril.  Past Medical History:  Diagnosis Date  . GERD (gastroesophageal reflux disease)   . Hyperlipidemia   . Hypertension   . Reactive airway disease    post CAP    Patient Active Problem List   Diagnosis Date Noted  . DDD (degenerative disc disease), cervical 09/24/2019  . Numbness and tingling 09/06/2019  . Low iron 09/06/2019  . Tremor 09/06/2019  . Restless leg syndrome 07/27/2018  . Chronic pain of left knee 04/17/2018  . Right low back pain 04/17/2018  . Fatigue 08/21/2017  . Bradycardia 08/21/2017  . Atypical chest pain 08/21/2017  . Anxiety 12/08/2013  . Obesity (BMI 30-39.9) 08/02/2013  . Rhinitis 08/02/2013  . Cervical stenosis of spine 04/26/2011  . Hyperlipidemia 06/05/2007  . Reactive airway disease 06/05/2007  . GERD 06/05/2007    Past Surgical History:  Procedure Laterality Date  . CARDIAC CATHETERIZATION  1997   negative  . COLONOSCOPY  2007   Dr Cristina Gong  . DILATION AND CURETTAGE OF UTERUS    . UPPER GI ENDOSCOPY     X 2  . WISDOM TOOTH EXTRACTION       OB History   No  obstetric history on file.     Family History  Adopted: Yes    Social History   Tobacco Use  . Smoking status: Former Smoker    Quit date: 03/14/1984    Years since quitting: 35.6  . Smokeless tobacco: Never Used  . Tobacco comment: smoked 1975-1986, up to 1.5 ppd  Substance Use Topics  . Alcohol use: No  . Drug use: No    Home Medications Prior to Admission medications   Medication Sig Start Date End Date Taking? Authorizing Provider  acetaminophen (TYLENOL) 500 MG tablet Take 500 mg by mouth every 6 (six) hours as needed.    [provider]  albuterol (VENTOLIN HFA) 108 (90 Base) MCG/ACT inhaler INHALE 2 PUFFS BY MOUTH EVERY 4 TO 6 HOURS AS NEEDED FOR COUGH OR WHEEZE *MAY USE 10-20 MIN PRIOR TO EXERCISE 01/17/19   Burns, Claudina Lick, MD  Ascorbic Acid (VITAMIN C PO) Take 1 tablet by mouth daily. 1000mg     [provider]  BREO ELLIPTA 100-25 MCG/INH AEPB INHALE 1 PUFF BY MOUTH DAILY AS NEEDED. 04/15/19   Binnie Rail, MD  Ferrous Sulfate (IRON PO) Take 10 mg by mouth. Patient takes 2 daily(gummies)    [provider]  FLUoxetine (PROZAC) 20 MG tablet Take 1 tablet (20 mg total) by mouth daily. 07/27/18   Binnie Rail, MD  fluticasone (VERAMYST) 27.5 MCG/SPRAY nasal spray Place 2 sprays  into the nose daily.    [provider]  hydrochlorothiazide (MICROZIDE) 12.5 MG capsule TAKE 1 CAPSULE BY MOUTH DAILY. 08/16/19   Binnie Rail, MD  Loperamide HCl (LOPERAMIDE A-D PO) Take by mouth. Patient takes 3xs a week as needed    [provider]  mometasone (ELOCON) 0.1 % cream mometasone 0.1 % topical cream  APPLY A THIN LAYER TO THE AFFECTED AREA OF THE SKIN DAILY    [provider]  Multiple Vitamin (MULTIVITAMIN) tablet Take 1 tablet by mouth daily.    [provider]  Nabumetone (RELAFEN DS) 1000 MG TABS Take 1 tablet by mouth daily as needed. 09/24/19   Janith Lima, MD  omeprazole (PRILOSEC) 40 MG capsule TAKE 1 CAPSULE BY  MOUTH ONCE DAILY 10/08/19   Binnie Rail, MD  phenylephrine (SUDAFED PE) 10 MG TABS tablet Take 10 mg by mouth as needed.    [provider]  predniSONE (DELTASONE) 20 MG tablet Take 2 tablets (40 mg total) by mouth daily with breakfast for 3 days. For the next four days 11/04/19 11/07/19  Carmin Muskrat, MD  Probiotic Product (ALIGN PO) Take by mouth daily.    [provider]  tizanidine (ZANAFLEX) 2 MG capsule Take 1 capsule (2 mg total) by mouth 3 (three) times daily as needed for muscle spasms. 09/24/19   Janith Lima, MD  triamcinolone cream (KENALOG) 0.1 % Apply 1 application topically 2 (two) times daily. 07/27/18   Binnie Rail, MD  valACYclovir (VALTREX) 500 MG tablet TAKE 1 TABLET BY MOUTH DAILY. 09/13/19   Binnie Rail, MD    Allergies    Penicillins and Lipitor [atorvastatin]  Review of Systems   Review of Systems  Constitutional:       Per HPI, otherwise negative  HENT:       Per HPI, otherwise negative  Respiratory:       Per HPI, otherwise negative  Cardiovascular:       Per HPI, otherwise negative  Gastrointestinal: Negative for vomiting.  Endocrine:       Negative aside from HPI  Genitourinary:       Neg aside from HPI   Musculoskeletal:       Per HPI, otherwise negative  Skin: Negative.   Neurological: Negative for syncope.    Physical Exam Updated Vital Signs BP (!) 147/88 (BP Location: Right Arm)   Pulse 78   Temp 98 F (36.7 C) (Oral)   Resp 14   Ht 5\' 5"  (1.651 m)   Wt 90.3 kg   SpO2 99%   BMI 33.12 kg/m   Physical Exam Vitals and nursing note reviewed.  Constitutional:      General: She is not in acute distress.    Appearance: She is well-developed.  HENT:     Head: Normocephalic and atraumatic.   Eyes:     Conjunctiva/sclera: Conjunctivae normal.  Cardiovascular:     Rate and Rhythm: Normal rate and regular rhythm.  Pulmonary:     Effort: Pulmonary effort is normal. No respiratory distress.     Breath sounds:  Normal breath sounds. No stridor.  Abdominal:     General: There is no distension.  Skin:    General: Skin is warm and dry.     Findings: No rash.  Neurological:     Mental Status: She is alert and oriented to person, place, and time.     Cranial Nerves: No cranial nerve deficit.  Psychiatric:  Mood and Affect: Mood normal.     ED Results / Procedures / Treatments   Labs (all labs ordered are listed, but only abnormal results are displayed) Labs Reviewed  BASIC METABOLIC PANEL  CBC WITH DIFFERENTIAL/PLATELET    EKG None  Radiology No results found.  Procedures Procedures (including critical care time)  Medications Ordered in ED Medications  methylPREDNISolone sodium succinate (SOLU-MEDROL) 125 mg/2 mL injection 125 mg (125 mg Intravenous Given 11/04/19 1114)    ED Course  I have reviewed the triage vital signs and the nursing notes.  Pertinent labs & imaging results that were available during my care of the patient were reviewed by me and considered in my medical decision making (see chart for details).     Adult female presents with evidence of angioedema.  No evidence for oropharyngeal swelling, no early hemodynamic abnormalities, but given her presentation she was placed on continuous monitoring, labs sent, steroids ordered.  Patient took antihistamines prior to arrival.   MDM Rules/Calculators/A&P                          1:11 PM On repeat exam the patient is awake, alert, speaking clearly, sitting upright.  Vital signs are unremarkable. Her tongue is essentially same size as on arrival. She had discussed today's evaluation, reassuring labs, vital signs, absence of progression, some suspicion for allergic reaction, though without clear precipitant. No evidence for airway compromise, decompensation, anaphylaxis. Patient appropriate for, amenable to discharge with ongoing meds, outpatient follow-up as needed.  Final Clinical Impression(s) / ED  Diagnoses Final diagnoses:  Allergic reaction, initial encounter    Rx / DC Orders ED Discharge Orders         Ordered    predniSONE (DELTASONE) 20 MG tablet  Daily with breakfast        11/04/19 1308           Carmin Muskrat, MD 11/04/19 1312

## 2019-11-04 NOTE — ED Triage Notes (Signed)
Pt came in for an allergic reaction. Pt doesn't remember eating any new foods that could have triggered this reaction.

## 2019-11-09 DIAGNOSIS — R519 Headache, unspecified: Secondary | ICD-10-CM | POA: Diagnosis not present

## 2019-11-09 DIAGNOSIS — I10 Essential (primary) hypertension: Secondary | ICD-10-CM | POA: Diagnosis not present

## 2019-11-09 DIAGNOSIS — R0789 Other chest pain: Secondary | ICD-10-CM | POA: Insufficient documentation

## 2019-11-09 DIAGNOSIS — M542 Cervicalgia: Secondary | ICD-10-CM | POA: Diagnosis not present

## 2019-11-09 DIAGNOSIS — Z87891 Personal history of nicotine dependence: Secondary | ICD-10-CM | POA: Insufficient documentation

## 2019-11-09 DIAGNOSIS — Z79899 Other long term (current) drug therapy: Secondary | ICD-10-CM | POA: Insufficient documentation

## 2019-11-09 DIAGNOSIS — R110 Nausea: Secondary | ICD-10-CM | POA: Insufficient documentation

## 2019-11-09 DIAGNOSIS — R079 Chest pain, unspecified: Secondary | ICD-10-CM | POA: Diagnosis not present

## 2019-11-10 ENCOUNTER — Encounter (HOSPITAL_COMMUNITY): Payer: Self-pay

## 2019-11-10 ENCOUNTER — Emergency Department (HOSPITAL_COMMUNITY)
Admission: EM | Admit: 2019-11-10 | Discharge: 2019-11-10 | Disposition: A | Discharge status: Home / Self Care | Payer: 59 | Attending: Emergency Medicine | Admitting: Emergency Medicine

## 2019-11-10 ENCOUNTER — Emergency Department (HOSPITAL_COMMUNITY): Payer: 59

## 2019-11-10 DIAGNOSIS — R519 Headache, unspecified: Secondary | ICD-10-CM | POA: Diagnosis not present

## 2019-11-10 DIAGNOSIS — R079 Chest pain, unspecified: Secondary | ICD-10-CM | POA: Diagnosis not present

## 2019-11-10 DIAGNOSIS — R110 Nausea: Secondary | ICD-10-CM | POA: Diagnosis not present

## 2019-11-10 DIAGNOSIS — Z87891 Personal history of nicotine dependence: Secondary | ICD-10-CM | POA: Diagnosis not present

## 2019-11-10 DIAGNOSIS — M542 Cervicalgia: Secondary | ICD-10-CM | POA: Diagnosis not present

## 2019-11-10 DIAGNOSIS — R0789 Other chest pain: Secondary | ICD-10-CM | POA: Diagnosis not present

## 2019-11-10 DIAGNOSIS — Z79899 Other long term (current) drug therapy: Secondary | ICD-10-CM | POA: Diagnosis not present

## 2019-11-10 DIAGNOSIS — I10 Essential (primary) hypertension: Secondary | ICD-10-CM | POA: Diagnosis not present

## 2019-11-10 LAB — BASIC METABOLIC PANEL
Anion gap: 11 (ref 5–15)
BUN: 19 mg/dL (ref 8–23)
CO2: 28 mmol/L (ref 22–32)
Calcium: 9.6 mg/dL (ref 8.9–10.3)
Chloride: 99 mmol/L (ref 98–111)
Creatinine, Ser: 0.89 mg/dL (ref 0.44–1.00)
GFR calc Af Amer: >60 mL/min (ref >60)
GFR calc non Af Amer: >60 mL/min (ref >60)
Glucose, Bld: 104 mg/dL — ABNORMAL HIGH (ref 70–99)
Potassium: 4 mmol/L (ref 3.5–5.1)
Sodium: 138 mmol/L (ref 135–145)

## 2019-11-10 LAB — CBC
HCT: 44.6 % (ref 36.0–46.0)
Hemoglobin: 14.1 g/dL (ref 12.0–15.0)
MCH: 28.8 pg (ref 26.0–34.0)
MCHC: 31.6 g/dL (ref 30.0–36.0)
MCV: 91 fL (ref 80.0–100.0)
Platelets: 303 10*3/uL (ref 150–400)
RBC: 4.9 MIL/uL (ref 3.87–5.11)
RDW: 13.1 % (ref 11.5–15.5)
WBC: 10.2 10*3/uL (ref 4.0–10.5)
nRBC: 0 % (ref 0.0–0.2)

## 2019-11-10 LAB — TROPONIN I (HIGH SENSITIVITY)
Troponin I (High Sensitivity): 4 ng/L (ref <18)
Troponin I (High Sensitivity): 7 ng/L (ref <18)

## 2019-11-10 NOTE — ED Provider Notes (Signed)
[facility] EMERGENCY DEPARTMENT Provider Note   CSN: [id number] Arrival date & time: 11/09/19  2358     History Chief Complaint  Patient presents with  . Chest Pain    Dana Mccoy is a 64 y.o. female.  HPI   Patient is a 64 year old female with a history of hyperlipidemia, hypertension, GERD.  About 15 hours ago she began experiencing squeezing/stabbing 10/10 central chest pain that radiated up her neck to the bilateral temporal regions, left greater than right.  Her pain was constant with no modifying factors.  EMS was called about 3 hours later and patient was given 3 rounds of nitro as well as 324 mg of ASA.  She states her symptoms fully resolved within 1 hour.  She does also note that she takes omeprazole nightly and took a dose of this after the onset of her chest pain.  Her symptoms have not recurred since onset.  She noted some mild nausea with no vomiting when her pain was at its worst.  No radiation of pain, numbness, tingling, down her left arm.  No abdominal pain.  Patient notes a mild diffuse headache since taking the nitroglycerin, but otherwise does not have any acute complaints at this time.  Patient had a cardiac stress test performed in January of last year by Dr. Harrell Gave which was low risk with no electrical changes to suggest any blockages.  Patient also had a long-term monitor placed which was "largely normal".  There was one episode of 4 beat SVT but otherwise it was benign.     Past Medical History:  Diagnosis Date  . GERD (gastroesophageal reflux disease)   . Hyperlipidemia   . Hypertension   . Reactive airway disease    post CAP    Patient Active Problem List   Diagnosis Date Noted  . DDD (degenerative disc disease), cervical 09/24/2019  . Numbness and tingling 09/06/2019  . Low iron 09/06/2019  . Tremor 09/06/2019  . Restless leg syndrome 07/27/2018  . Chronic pain of left knee 04/17/2018  . Right low back pain  04/17/2018  . Fatigue 08/21/2017  . Bradycardia 08/21/2017  . Atypical chest pain 08/21/2017  . Anxiety 12/08/2013  . Obesity (BMI 30-39.9) 08/02/2013  . Rhinitis 08/02/2013  . Cervical stenosis of spine 04/26/2011  . Hyperlipidemia 06/05/2007  . Reactive airway disease 06/05/2007  . GERD 06/05/2007    Past Surgical History:  Procedure Laterality Date  . CARDIAC CATHETERIZATION  1997   negative  . COLONOSCOPY  2007   Dr Cristina Gong  . DILATION AND CURETTAGE OF UTERUS    . UPPER GI ENDOSCOPY     X 2  . WISDOM TOOTH EXTRACTION       OB History   No obstetric history on file.     Family History  Adopted: Yes    Social History   Tobacco Use  . Smoking status: Former Smoker    Quit date: 03/14/1984    Years since quitting: 35.6  . Smokeless tobacco: Never Used  . Tobacco comment: smoked 1975-1986, up to 1.5 ppd  Substance Use Topics  . Alcohol use: No  . Drug use: No    Home Medications Prior to Admission medications   Medication Sig Start Date End Date Taking? Authorizing Provider  acetaminophen (TYLENOL) 500 MG tablet Take 500 mg by mouth every 6 (six) hours as needed.    [provider]  albuterol (VENTOLIN HFA) 108 (90 Base) MCG/ACT inhaler INHALE 2 PUFFS BY  MOUTH EVERY 4 TO 6 HOURS AS NEEDED FOR COUGH OR WHEEZE *MAY USE 10-20 MIN PRIOR TO EXERCISE 01/17/19   Burns, Claudina Lick, MD  Ascorbic Acid (VITAMIN C PO) Take 1 tablet by mouth daily. 1000mg     [provider]  BREO ELLIPTA 100-25 MCG/INH AEPB INHALE 1 PUFF BY MOUTH DAILY AS NEEDED. 04/15/19   Binnie Rail, MD  Ferrous Sulfate (IRON PO) Take 10 mg by mouth. Patient takes 2 daily(gummies)    [provider]  FLUoxetine (PROZAC) 20 MG tablet Take 1 tablet (20 mg total) by mouth daily. 07/27/18   Burns, Claudina Lick, MD  fluticasone (VERAMYST) 27.5 MCG/SPRAY nasal spray Place 2 sprays into the nose daily.    [provider]  hydrochlorothiazide (MICROZIDE) 12.5 MG capsule TAKE 1 CAPSULE BY  MOUTH DAILY. 08/16/19   Binnie Rail, MD  Loperamide HCl (LOPERAMIDE A-D PO) Take by mouth. Patient takes 3xs a week as needed    [provider]  mometasone (ELOCON) 0.1 % cream mometasone 0.1 % topical cream  APPLY A THIN LAYER TO THE AFFECTED AREA OF THE SKIN DAILY    [provider]  Multiple Vitamin (MULTIVITAMIN) tablet Take 1 tablet by mouth daily.    [provider]  Nabumetone (RELAFEN DS) 1000 MG TABS Take 1 tablet by mouth daily as needed. 09/24/19   Janith Lima, MD  omeprazole (PRILOSEC) 40 MG capsule TAKE 1 CAPSULE BY MOUTH ONCE DAILY 10/08/19   Binnie Rail, MD  phenylephrine (SUDAFED PE) 10 MG TABS tablet Take 10 mg by mouth as needed.    [provider]  Probiotic Product (ALIGN PO) Take by mouth daily.    [provider]  tizanidine (ZANAFLEX) 2 MG capsule Take 1 capsule (2 mg total) by mouth 3 (three) times daily as needed for muscle spasms. 09/24/19   Janith Lima, MD  triamcinolone cream (KENALOG) 0.1 % Apply 1 application topically 2 (two) times daily. 07/27/18   Binnie Rail, MD  valACYclovir (VALTREX) 500 MG tablet TAKE 1 TABLET BY MOUTH DAILY. 09/13/19   Binnie Rail, MD    Allergies    Penicillins and Lipitor [atorvastatin]  Review of Systems   Review of Systems  All other systems reviewed and are negative. Ten systems reviewed and are negative for acute change, except as noted in the HPI.   Physical Exam Updated Vital Signs BP 140/76 (BP Location: Right Arm)   Pulse (!) 57   Temp 98.2 F (36.8 C) (Oral)   Resp 20   SpO2 98%   Physical Exam Vitals and nursing note reviewed.  Constitutional:      General: She is not in acute distress.    Appearance: Normal appearance. She is well-developed and normal weight. She is not ill-appearing, toxic-appearing or diaphoretic.  HENT:     Head: Normocephalic and atraumatic.     Right Ear: External ear normal.     Left Ear: External ear normal.     Nose: Nose normal.      Mouth/Throat:     Mouth: Mucous membranes are moist.     Pharynx: Oropharynx is clear. No oropharyngeal exudate or posterior oropharyngeal erythema.  Eyes:     Extraocular Movements: Extraocular movements intact.  Cardiovascular:     Rate and Rhythm: Regular rhythm. Bradycardia present.     Pulses: Normal pulses.          Radial pulses are 2+ on the right side and 2+ on  the left side.       Dorsalis pedis pulses are 2+ on the right side and 2+ on the left side.     Heart sounds: Normal heart sounds. Heart sounds not distant. No murmur heard.  No friction rub. No gallop.   Pulmonary:     Effort: Pulmonary effort is normal. No respiratory distress.     Breath sounds: Normal breath sounds. No stridor. No decreased breath sounds, wheezing, rhonchi or rales.  Abdominal:     General: Abdomen is flat.     Palpations: Abdomen is soft.     Tenderness: There is no abdominal tenderness.  Musculoskeletal:        General: Normal range of motion.     Cervical back: Normal range of motion and neck supple. No tenderness.     Right lower leg: No tenderness. No edema.     Left lower leg: No tenderness. No edema.  Skin:    General: Skin is warm and dry.     Capillary Refill: Capillary refill takes less than 2 seconds.  Neurological:     General: No focal deficit present.     Mental Status: She is alert and oriented to person, place, and time.  Psychiatric:        Mood and Affect: Mood normal.        Behavior: Behavior normal.    ED Results / Procedures / Treatments   Labs (all labs ordered are listed, but only abnormal results are displayed) Labs Reviewed  BASIC METABOLIC PANEL - Abnormal; Notable for the following components:      Result Value   Glucose, Bld 104 (*)    All other components within normal limits  CBC  TROPONIN I (HIGH SENSITIVITY)  TROPONIN I (HIGH SENSITIVITY)   EKG EKG Interpretation  Date/Time:  Sunday November 10 2019 00:09:33 EDT Ventricular Rate:  74 PR  Interval:  136 QRS Duration: 80 QT Interval:  392 QTC Calculation: 435 R Axis:   10 Text Interpretation: Normal sinus rhythm Possible Anterior infarct , age undetermined Abnormal ECG No old tracing to compare Confirmed by Aletta Edouard [phone]) on 11/10/2019 10:48:59 AM  Radiology DG Chest 2 View  Result Date: 11/10/2019 CLINICAL DATA:  Chest pain radiating to the back EXAM: CHEST - 2 VIEW COMPARISON:  07/21/2016 FINDINGS: The heart size and mediastinal contours are within normal limits. Both lungs are clear. The visualized skeletal structures are unremarkable. Mild chronic bronchitic changes. IMPRESSION: No active cardiopulmonary disease. Electronically Signed   By: Donavan Foil M.D.   On: 11/10/2019 00:30   Procedures Procedures   Medications Ordered in ED Medications - No data to display  ED Course  I have reviewed the triage vital signs and the nursing notes.  Pertinent labs & imaging results that were available during my care of the patient were reviewed by me and considered in my medical decision making (see chart for details).  Clinical Course as of Nov 10 1127  Sun Nov 10, 2019  1048 Troponin I (High Sensitivity): 7 [LJ]  1048 Negative  DG Chest 2 View [LJ]    Clinical Course User Index [LJ] Rayna Sexton, PA-C   MDM Rules/Calculators/A&P                          Pt is a 64 y.o. female that presents with a history, physical exam, and ED Clinical Course as noted above.   Patient presents to the emergency department last  night due to a episode of chest pain as well as mild nausea which improved after nitroglycerin and ASA.  Patient has had no further chest pain for the past 11 hours.  Basic cardiac work-up obtained which was benign.  Negative troponin x2.  Reassuring chest x-ray.  Reassuring ECG.  Vital signs have been stable throughout her stay.  She has not hypoxic or tachycardic.  Normotensive.  Not tachypneic.  Physical exam is extremely reassuring.  No  abdominal tenderness.  No current chest pain, shortness of breath, nausea, vomiting.  Patient is not diaphoretic.  She does have a mild headache that started after taking nitroglycerin.  Patient was discussed with and evaluated by my attending physician Dr. Aletta Edouard.  Her heart score today is calculated at 2.  Discussed this with the patient and she feels comfortable being discharged at this time.  We will have her follow-up with her cardiologist as well as her primary care provider.  Patient is hemodynamically stable and in NAD at the time of d/c. Evaluation does not show pathology that would require ongoing emergent intervention or inpatient treatment. I explained the diagnosis to the patient. Patient is comfortable with above plan and is stable for discharge at this time. All questions were answered prior to disposition. Strict return precautions for returning to the ED were discussed. Encouraged follow up with PCP.    An After Visit Summary was printed and given to the patient.  Patient discharged to home/self care.  Condition at discharge: Stable  Note: Portions of this report may have been transcribed using voice recognition software. Every effort was made to ensure accuracy; however, inadvertent computerized transcription errors may be present.   Final Clinical Impression(s) / ED Diagnoses Final diagnoses:  Atypical chest pain   Rx / DC Orders ED Discharge Orders    None       Rayna Sexton, PA-C 11/10/19 1135    Hayden Rasmussen, MD 11/10/19 2315

## 2019-11-10 NOTE — Discharge Instructions (Signed)
Please follow-up with your cardiologist as well as your primary care provider.  Please return to the emergency department with new or worsening symptoms.  It was a pleasure to meet you.

## 2019-11-10 NOTE — ED Notes (Signed)
PA at bedside.

## 2019-11-10 NOTE — ED Notes (Signed)
ED Provider at bedside. 

## 2019-11-10 NOTE — ED Triage Notes (Signed)
Pt comes via [facility] for CP that started at 830, central, radiates to back and L side of neck, PTA received 3 nitro with some relief and 324 ASA.

## 2019-11-11 ENCOUNTER — Telehealth: Payer: Self-pay | Admitting: Internal Medicine

## 2019-11-11 NOTE — Telephone Encounter (Signed)
FYI - TEAM HEALTH REPORT/CALL : ---Caller states she has a history of esophageal spasms. States she is miserable and in a lot of pain. It's been going on for over an hour. States she is having chest pain, back pain, and facial pain  Advised call 911 EMS now. Patient did go to ED on 8/29.

## 2019-11-15 ENCOUNTER — Other Ambulatory Visit: Payer: Self-pay

## 2019-11-15 ENCOUNTER — Ambulatory Visit (INDEPENDENT_AMBULATORY_CARE_PROVIDER_SITE_OTHER): Payer: 59 | Admitting: Cardiology

## 2019-11-15 ENCOUNTER — Encounter: Payer: Self-pay | Admitting: Cardiology

## 2019-11-15 VITALS — BP 130/78 | HR 69 | Temp 97.3°F | Ht 65.0 in | Wt 196.0 lb

## 2019-11-15 DIAGNOSIS — R072 Precordial pain: Secondary | ICD-10-CM

## 2019-11-15 DIAGNOSIS — Z7189 Other specified counseling: Secondary | ICD-10-CM

## 2019-11-15 DIAGNOSIS — Z01812 Encounter for preprocedural laboratory examination: Secondary | ICD-10-CM

## 2019-11-15 DIAGNOSIS — R002 Palpitations: Secondary | ICD-10-CM | POA: Diagnosis not present

## 2019-11-15 DIAGNOSIS — E782 Mixed hyperlipidemia: Secondary | ICD-10-CM

## 2019-11-15 DIAGNOSIS — I10 Essential (primary) hypertension: Secondary | ICD-10-CM | POA: Diagnosis not present

## 2019-11-15 NOTE — Progress Notes (Signed)
Cardiology Office Note:    Date:  11/15/2019   ID:  Dana Mccoy, DOB [birth year], MRN [id number]  PCP:  Binnie Rail, MD  Cardiologist:  Buford Dresser, MD PhD  Referring MD: Binnie Rail, MD   CC: fatigue  History of Present Illness:    Dana Mccoy is a 64 y.o. female with a hx of hypertension, hyperlipidemia who is seen for follow up. I initially met her 04/04/18 as a new consult at the request of Burns, Dana Lick, MD for the evaluation and management of bradycardia.  Today: Went to ER 11/10/19 for chest pain. Notes, workup reviewed. She notes that she had an episode of central chest pain the night prior that radiated up her neck. Pain was severe, felt like a squeezing/pressure, went all the way up her head. Nothing seemed to make it better or worse. Called EMS and brought to ER. When she was in ER, pain had resolved (she received nitroglycerin and aspirin en route). She also noted taking a PPI after her symptoms started and before EMS. At the peak, felt nauseated but no diaphoresis. Had a headache after the nitro.   We discussed her episode and her prior workup as well, see below re: shared decision making.  Denies shortness of breath at rest or with normal exertion. No PND, orthopnea, LE edema or unexpected weight gain. No syncope.  Past Medical History:  Diagnosis Date  . GERD (gastroesophageal reflux disease)   . Hyperlipidemia   . Hypertension   . Reactive airway disease    post CAP    Past Surgical History:  Procedure Laterality Date  . CARDIAC CATHETERIZATION  1997   negative  . COLONOSCOPY  2007   Dr Cristina Gong  . DILATION AND CURETTAGE OF UTERUS    . UPPER GI ENDOSCOPY     X 2  . WISDOM TOOTH EXTRACTION      Current Medications: Current Outpatient Medications on File Prior to Visit  Medication Sig  . acetaminophen (TYLENOL) 500 MG tablet Take 500 mg by mouth every 6 (six) hours as needed.  Marland Kitchen albuterol (VENTOLIN HFA) 108 (90 Base)  MCG/ACT inhaler INHALE 2 PUFFS BY MOUTH EVERY 4 TO 6 HOURS AS NEEDED FOR COUGH OR WHEEZE *MAY USE 10-20 MIN PRIOR TO EXERCISE  . BREO ELLIPTA 100-25 MCG/INH AEPB INHALE 1 PUFF BY MOUTH DAILY AS NEEDED.  Marland Kitchen FLUoxetine (PROZAC) 20 MG capsule Take 20 mg by mouth daily.  Marland Kitchen FLUoxetine (PROZAC) 20 MG tablet Take 1 tablet (20 mg total) by mouth daily.  . fluticasone (VERAMYST) 27.5 MCG/SPRAY nasal spray Place 2 sprays into the nose daily.  . hydrochlorothiazide (MICROZIDE) 12.5 MG capsule TAKE 1 CAPSULE BY MOUTH DAILY.  Marland Kitchen Loperamide HCl (LOPERAMIDE A-D PO) Take by mouth. Patient takes 3xs a week as needed  . mometasone (ELOCON) 0.1 % cream mometasone 0.1 % topical cream  APPLY A THIN LAYER TO THE AFFECTED AREA OF THE SKIN DAILY  . Nabumetone (RELAFEN DS) 1000 MG TABS Take 1 tablet by mouth daily as needed.  Marland Kitchen omeprazole (PRILOSEC) 40 MG capsule TAKE 1 CAPSULE BY MOUTH ONCE DAILY  . phenylephrine (SUDAFED PE) 10 MG TABS tablet Take 10 mg by mouth as needed.  . Probiotic Product (ALIGN PO) Take by mouth daily.  . tizanidine (ZANAFLEX) 2 MG capsule Take 1 capsule (2 mg total) by mouth 3 (three) times daily as needed for muscle spasms.  Marland Kitchen triamcinolone cream (KENALOG) 0.1 % Apply 1 application topically 2 (two)  times daily.  . valACYclovir (VALTREX) 500 MG tablet TAKE 1 TABLET BY MOUTH DAILY.   No current facility-administered medications on file prior to visit.     Allergies:   Penicillins and Lipitor [atorvastatin]   Social History   Tobacco Use  . Smoking status: Former Smoker    Quit date: 03/14/1984    Years since quitting: 35.8  . Smokeless tobacco: Never Used  . Tobacco comment: smoked 1975-1986, up to 1.5 ppd  Substance Use Topics  . Alcohol use: No  . Drug use: No    Family History: The patient's family history is not on file. She was adopted.  ROS:   Please see the history of present illness.  Additional pertinent ROS otherwise unremarkable.   EKGs/Labs/Other Studies Reviewed:     The following studies were reviewed today: ETT 1/31/202  There was no ST segment deviation noted during stress.  Patient noted a few seconds of chest pressure. Fleeting.  Good overall exercise tolerance of 7 minutes and 41 seconds. Excellent blood pressure response.  Overall low risk exercise treadmill test with no electrocardiographic evidence of ischemia.  Monitor 05/01/2018 4 days of data recorded on [employer] monitor. Patient had a min HR of 44 bpm, max HR of 131 bpm, and avg HR of 63 bpm. Predominant underlying rhythm was Sinus Rhythm. There was one episode of SVT, lasting 4 beats with a rate of 98 bpm. No VT, atrial fibrillation, high degree block, or pauses noted. Isolated atrial and ventricular ectopy was rare (<1%). There were 6 patient triggered events, which were consistent with sinus rhythm (one event was sinus rhythm with PVC).   MPI 1997 Negative adequate Bruce, but breast attenuation vs. Ischemia seen on imaging. Recommended for cath.  Cath 1997, reported as normal, I cannot see results.  EKG:  EKG is personally reviewed.  The ekg ordered 11/10/19 demonstrates NSR, poor R wave progression  Recent Labs: 09/06/2019: ALT 21; TSH 2.89 11/10/2019: BUN 19; Creatinine, Ser 0.89; Hemoglobin 14.1; Platelets 303; Potassium 4.0; Sodium 138  Recent Lipid Panel    Component Value Date/Time   CHOL 240 (H) 09/06/2019 1420   CHOL 231 (H) 04/04/2018 1010   CHOL 260 (H) 09/05/2014 1026   TRIG 235.0 (H) 09/06/2019 1420   TRIG 161 (H) 09/05/2014 1026   HDL 52.00 09/06/2019 1420   HDL 55 04/04/2018 1010   HDL 59 09/05/2014 1026   CHOLHDL 5 09/06/2019 1420   VLDL 47.0 (H) 09/06/2019 1420   LDLCALC 169 (H) 07/27/2018 1600   LDLCALC 137 (H) 04/04/2018 1010   LDLCALC 169 (H) 09/05/2014 1026   LDLDIRECT 150.0 09/06/2019 1420    Physical Exam:    VS:  BP 130/78   Pulse 69   Temp (!) 97.3 F (36.3 C)   Ht 5' 5"  (1.651 m)   Wt 196 lb (88.9 kg)   SpO2 94%   BMI 32.62 kg/m      Wt Readings from Last 3 Encounters:  11/15/19 196 lb (88.9 kg)  11/04/19 199 lb (90.3 kg)  09/24/19 201 lb (91.2 kg)    GEN: Well nourished, well developed in no acute distress HEENT: Normal, moist mucous membranes NECK: No JVD CARDIAC: regular rhythm, normal S1 and S2, no rubs or gallops. No murmur. VASCULAR: Radial and DP pulses 2+ bilaterally. No carotid bruits RESPIRATORY:  Clear to auscultation without rales, wheezing or rhonchi  ABDOMEN: Soft, non-tender, non-distended MUSCULOSKELETAL:  Ambulates independently SKIN: Warm and dry, no edema NEUROLOGIC:  Alert and oriented  x 3. No focal neuro deficits noted. PSYCHIATRIC:  Normal affect   ASSESSMENT:    1. Precordial pain   2. Pre-procedure lab exam   3. Heart palpitations   4. Mixed hyperlipidemia   5. Essential hypertension   6. Cardiac risk counseling   7. Counseling on health promotion and disease prevention    PLAN:    Chest pressure: has been chronic/intermittent, but recently had severe episode that brought her to ER -treadmill stress test 2020 was unremarkable -discussed further options. She would like an anatomic study. We discussed at length, and after shared decision making will pursue CT coronary angiography -BMET prior to test  Palpitations: stable -reviewed prior monitor results  Hyperlipidemia, with hypertriglyceridemia: felt this was due to poor diet choices -lipids 08/2019 reviewed. Tchol 240, TG 235, HDL 52, LDL 150 -we have discussed statins. She feels that atorvastatin affected her cognitive abilities. Use CT results to further evaluate risk  Hypertension: at goal today -continue HCTZ  Prevention and counseling -recommend heart healthy/Mediterranean diet, with whole grains, fruits, vegetable, fish, lean meats, nuts, and olive oil. Limit salt. -recommend moderate walking, 3-5 times/week for 30-50 minutes each session. Aim for at least 150 minutes.week. Goal should be pace of 3 miles/hours, or  walking 1.5 miles in 30 minutes -recommend avoidance of tobacco products. Avoid excess alcohol. -Additional risk factor control:  -Diabetes: A1c is 5.5 in 2017  -Weight: BMI 32  -snoring: had sleep study 05/2018, no significant sleep apnea -ASCVD risk score: The 10-year ASCVD risk score Mikey Bussing DC Brooke Bonito., et al., 2013) is: 7.9%   Values used to calculate the score:     Age: 42 years     Sex: Female     Is Non-Hispanic African American: No     Diabetic: No     Tobacco smoker: No     Systolic Blood Pressure: 641 mmHg     Is BP treated: Yes     HDL Cholesterol: 52 mg/dL     Total Cholesterol: 240 mg/dL   Plan for follow up: if CT not high risk, follow up in 6 mos. If CT concerning, will follow up sooner  Medication Adjustments/Labs and Tests Ordered: Current medicines are reviewed at length with the patient today.  Concerns regarding medicines are outlined above.  Orders Placed This Encounter  Procedures  . CT CORONARY MORPH W/CTA COR W/SCORE W/CA W/CM &/OR WO/CM  . CT CORONARY FRACTIONAL FLOW RESERVE DATA PREP  . CT CORONARY FRACTIONAL FLOW RESERVE FLUID ANALYSIS  . Basic metabolic panel   No orders of the defined types were placed in this encounter.   Patient Instructions  Medication Instructions:  Your Physician recommend you continue on your current medication as directed.    *If you need a refill on your cardiac medications before your next appointment, please call your pharmacy*   Lab Work: Your physician recommends that you return for lab work 1 week prior to procedure ( BMP).  If you have labs (blood work) drawn today and your tests are completely normal, you will receive your results only by: Marland Kitchen MyChart Message (if you have MyChart) OR . A paper copy in the mail If you have any lab test that is abnormal or we need to change your treatment, we will call you to review the results.   Testing/Procedures: Cardiac CT Angiography (CTA), is a special type of CT scan that uses  a computer to produce multi-dimensional views of major blood vessels throughout the body. In CT angiography,  a contrast material is injected through an IV to help visualize the blood vessels [facility]  Follow-Up: At [facility], you and your health needs are our priority.  As part of our continuing mission to provide you with exceptional heart care, we have created designated Provider Care Teams.  These Care Teams include your primary Cardiologist (physician) and Advanced Practice Providers (APPs -  Physician Assistants and Nurse Practitioners) who all work together to provide you with the care you need, when you need it.  We recommend signing up for the patient portal called "MyChart".  Sign up information is provided on this After Visit Summary.  MyChart is used to connect with patients for Virtual Visits (Telemedicine).  Patients are able to view lab/test results, encounter notes, upcoming appointments, etc.  Non-urgent messages can be sent to your provider as well.   To learn more about what you can do with MyChart, go to [email or web address].    Your next appointment:   6 month(s)  The format for your next appointment:   In Person  Provider:   Buford Dresser, MD  Your cardiac CT will be scheduled at one of the below locations:   [facility] [address] [place], [place] [address] [phone]   If scheduled at [facility], please arrive at the [facility] main entrance of [facility] 30 minutes prior to test start time. Proceed to the [facility] Radiology Department (first floor) to check-in and test prep.  If scheduled at [facility], please arrive 15 mins early for check-in and test prep.  Please follow these instructions carefully (unless otherwise directed):   On the Night Before the Test: . Be sure to Drink plenty of water. . Do not consume any caffeinated/decaffeinated beverages or  chocolate 12 hours prior to your test. . Do not take any antihistamines 12 hours prior to your test.   On the Day of the Test: . Drink plenty of water. Do not drink any water within one hour of the test. . Do not eat any food 4 hours prior to the test. . You may take your regular medications prior to the test.  . FEMALES- please wear underwire-free bra if available        After the Test: . Drink plenty of water. . After receiving IV contrast, you may experience a mild flushed feeling. This is normal. . On occasion, you may experience a mild rash up to 24 hours after the test. This is not dangerous. If this occurs, you can take Benadryl 25 mg and increase your fluid intake. . If you experience trouble breathing, this can be serious. If it is severe call 911 IMMEDIATELY. If it is mild, please call our office. . If you take any of these medications: Glipizide/Metformin, Avandament, Glucavance, please do not take 48 hours after completing test unless otherwise instructed.   Once we have confirmed authorization from your insurance company, we will call you to set up a date and time for your test. Based on how quickly your insurance processes prior authorizations requests, please allow up to 4 weeks to be contacted for scheduling your Cardiac CT appointment. Be advised that routine Cardiac CT appointments could be scheduled as many as 8 weeks after your provider has ordered it.  For non-scheduling related questions, please contact the cardiac imaging nurse navigator should you have any questions/concerns: Marchia Bond, Cardiac Imaging Nurse Navigator Burley Saver, Interim Cardiac Imaging Nurse Navigator [facility] Direct Office Dial: [phone]   For scheduling needs, including cancellations and rescheduling, please call Vivien Rota at [phone], option 3.       Signed, Buford Dresser, MD PhD 11/15/2019   [place]

## 2019-11-15 NOTE — Patient Instructions (Signed)
Medication Instructions:  Your Physician recommend you continue on your current medication as directed.    *If you need a refill on your cardiac medications before your next appointment, please call your pharmacy*   Lab Work: Your physician recommends that you return for lab work 1 week prior to procedure ( BMP).  If you have labs (blood work) drawn today and your tests are completely normal, you will receive your results only by: Marland Kitchen MyChart Message (if you have MyChart) OR . A paper copy in the mail If you have any lab test that is abnormal or we need to change your treatment, we will call you to review the results.   Testing/Procedures: Cardiac CT Angiography (CTA), is a special type of CT scan that uses a computer to produce multi-dimensional views of major blood vessels throughout the body. In CT angiography, a contrast material is injected through an IV to help visualize the blood vessels [facility]  Follow-Up: At [facility], you and your health needs are our priority.  As part of our continuing mission to provide you with exceptional heart care, we have created designated Provider Care Teams.  These Care Teams include your primary Cardiologist (physician) and Advanced Practice Providers (APPs -  Physician Assistants and Nurse Practitioners) who all work together to provide you with the care you need, when you need it.  We recommend signing up for the patient portal called "MyChart".  Sign up information is provided on this After Visit Summary.  MyChart is used to connect with patients for Virtual Visits (Telemedicine).  Patients are able to view lab/test results, encounter notes, upcoming appointments, etc.  Non-urgent messages can be sent to your provider as well.   To learn more about what you can do with MyChart, go to [email or web address].    Your next appointment:   6 month(s)  The format for your next appointment:   In Person  Provider:   Buford Dresser, MD  Your cardiac CT will be scheduled at one of the below locations:   [facility] [address] [place], [place] [address] [phone]   If scheduled at [facility], please arrive at the [facility] main entrance of [facility] 30 minutes prior to test start time. Proceed to the [facility] Radiology Department (first floor) to check-in and test prep.  If scheduled at [facility], please arrive 15 mins early for check-in and test prep.  Please follow these instructions carefully (unless otherwise directed):   On the Night Before the Test: . Be sure to Drink plenty of water. . Do not consume any caffeinated/decaffeinated beverages or chocolate 12 hours prior to your test. . Do not take any antihistamines 12 hours prior to your test.   On the Day of the Test: . Drink plenty of water. Do not drink any water within one hour of the test. . Do not eat any food 4 hours prior to the test. . You may take your regular medications prior to the test.  . FEMALES- please wear underwire-free bra if available        After the Test: . Drink plenty of water. . After receiving IV contrast, you may experience a mild flushed feeling. This is normal. . On occasion, you may experience a mild rash up to 24 hours after the test. This is not dangerous. If this occurs, you can take Benadryl 25 mg and increase your fluid intake. . If you experience trouble  breathing, this can be serious. If it is severe call 911 IMMEDIATELY. If it is mild, please call our office. . If you take any of these medications: Glipizide/Metformin, Avandament, Glucavance, please do not take 48 hours after completing test unless otherwise instructed.   Once we have confirmed authorization from your insurance company, we will call you to set up a date and time for your test. Based on how quickly your insurance processes prior authorizations requests, please  allow up to 4 weeks to be contacted for scheduling your Cardiac CT appointment. Be advised that routine Cardiac CT appointments could be scheduled as many as 8 weeks after your provider has ordered it.  For non-scheduling related questions, please contact the cardiac imaging nurse navigator should you have any questions/concerns: Marchia Bond, Cardiac Imaging Nurse Navigator Burley Saver, Interim Cardiac Imaging Nurse [place] [facility] Direct Office Dial: [phone]   For scheduling needs, including cancellations and rescheduling, please call Vivien Rota at [phone], option 3.

## 2019-11-20 ENCOUNTER — Telehealth: Payer: Self-pay | Admitting: Diagnostic Neuroimaging

## 2019-11-20 ENCOUNTER — Other Ambulatory Visit: Payer: Self-pay | Admitting: Internal Medicine

## 2019-11-20 ENCOUNTER — Encounter: Payer: Self-pay | Admitting: Diagnostic Neuroimaging

## 2019-11-20 ENCOUNTER — Ambulatory Visit: Payer: 59 | Admitting: Diagnostic Neuroimaging

## 2019-11-20 VITALS — BP 134/82 | HR 63 | Ht 65.0 in | Wt 198.6 lb

## 2019-11-20 DIAGNOSIS — R5382 Chronic fatigue, unspecified: Secondary | ICD-10-CM

## 2019-11-20 DIAGNOSIS — R200 Anesthesia of skin: Secondary | ICD-10-CM

## 2019-11-20 DIAGNOSIS — R519 Headache, unspecified: Secondary | ICD-10-CM

## 2019-11-20 DIAGNOSIS — R251 Tremor, unspecified: Secondary | ICD-10-CM

## 2019-11-20 MED FILL — HYDROCHLOROTHIAZIDE 12.5 MG: 12.5 | 90 days supply | Qty: 90 | Fill #0

## 2019-11-20 NOTE — Telephone Encounter (Signed)
LVM for pt to call back about scheduling mri  [facility] auth: Dana Mccoy Ref # [id number]

## 2019-11-20 NOTE — Progress Notes (Signed)
[facility]  PATIENT: Dana Mccoy DOB: 19-Aug-1955  REFERRING CLINICIAN: Binnie Rail, MD HISTORY FROM: patient  REASON FOR VISIT: new consult   HISTORICAL  CHIEF COMPLAINT:  Chief Complaint  Patient presents with  . Tremors    rm 6, New Pt     HISTORY OF PRESENT ILLNESS:   64 year old female here for evaluation of fatigue, headaches and numbness, tremor, word find difficulties.  Patient has had unexplained chronic fatigue for the past 1 year.  Symptoms mainly affect her when she is at home not doing that much activity.  She is able to do her work and drive without difficulty.  Patient also had intermittent headaches, with frontal, bandlike sensation, throbbing pain, phonophobia.  Headaches can last all day at a time.  No nausea, vision changes or dizziness.  In addition she has lower level daily nagging headaches.  She uses Tylenol 20 days a month.    Patient also has some intermittent numbness in her face.  Intermittent tremors.  Intermittent word find difficulty.  Having more stress.   REVIEW OF SYSTEMS: Full 14 system review of systems performed and negative with exception of: As per HPI.  ALLERGIES: Allergies  Allergen Reactions  . Penicillins     RASH, amoxicillin  . Lipitor [Atorvastatin]     Cognitive impact    HOME MEDICATIONS: Outpatient Medications Prior to Visit  Medication Sig Dispense Refill  . acetaminophen (TYLENOL) 500 MG tablet Take 500 mg by mouth every 6 (six) hours as needed.    Marland Kitchen albuterol (VENTOLIN HFA) 108 (90 Base) MCG/ACT inhaler INHALE 2 PUFFS BY MOUTH EVERY 4 TO 6 HOURS AS NEEDED FOR COUGH OR WHEEZE *MAY USE 10-20 MIN PRIOR TO EXERCISE 18 g 3  . BREO ELLIPTA 100-25 MCG/INH AEPB INHALE 1 PUFF BY MOUTH DAILY AS NEEDED. 60 each 3  . FLUoxetine (PROZAC) 20 MG capsule Take 20 mg by mouth daily.    . fluticasone (VERAMYST) 27.5 MCG/SPRAY nasal spray Place 2 sprays into the nose daily.    . hydrochlorothiazide  (MICROZIDE) 12.5 MG capsule TAKE 1 CAPSULE BY MOUTH ONCE DAILY 90 capsule 2  . Loperamide HCl (LOPERAMIDE A-D PO) Take by mouth. Patient takes 3xs a week as needed    . loratadine (CLARITIN) 10 MG tablet Take 10 mg by mouth daily as needed for allergies.    . mometasone (ELOCON) 0.1 % cream mometasone 0.1 % topical cream  APPLY A THIN LAYER TO THE AFFECTED AREA OF THE SKIN DAILY    . omeprazole (PRILOSEC) 40 MG capsule TAKE 1 CAPSULE BY MOUTH ONCE DAILY 90 capsule 0  . phenylephrine (SUDAFED PE) 10 MG TABS tablet Take 10 mg by mouth as needed.    . Probiotic Product (ALIGN PO) Take by mouth daily.    . tizanidine (ZANAFLEX) 2 MG capsule Take 1 capsule (2 mg total) by mouth 3 (three) times daily as needed for muscle spasms. 270 capsule 0  . valACYclovir (VALTREX) 500 MG tablet TAKE 1 TABLET BY MOUTH DAILY. 90 tablet 3  . Nabumetone (RELAFEN DS) 1000 MG TABS Take 1 tablet by mouth daily as needed. (Patient not taking: Reported on 11/20/2019) 8 tablet 0  . triamcinolone cream (KENALOG) 0.1 % Apply 1 application topically 2 (two) times daily. (Patient not taking: Reported on 11/20/2019) 30 g 2  . FLUoxetine (PROZAC) 20 MG tablet Take 1 tablet (20 mg total) by mouth daily. 90 tablet 3   No facility-administered medications prior to visit.  PAST MEDICAL HISTORY: Past Medical History:  Diagnosis Date  . GERD (gastroesophageal reflux disease)   . Hyperlipidemia   . Hypertension   . Reactive airway disease    post CAP    PAST SURGICAL HISTORY: Past Surgical History:  Procedure Laterality Date  . CARDIAC CATHETERIZATION  1997   negative  . COLONOSCOPY  2007   Dr Cristina Gong  . DILATION AND CURETTAGE OF UTERUS    . UPPER GI ENDOSCOPY     X 2  . WISDOM TOOTH EXTRACTION      FAMILY HISTORY: Family History  Adopted: Yes    SOCIAL HISTORY: Social History   Socioeconomic History  . Marital status: Divorced    Spouse name: Not on file  . Number of children: Not on file  . Years of  education: Not on file  . Highest education level: Bachelor's degree (e.g., BA, AB, BS)  Occupational History    Comment: [employer]  Tobacco Use  . Smoking status: Former Smoker    Quit date: 03/14/1984    Years since quitting: 35.7  . Smokeless tobacco: Never Used  . Tobacco comment: smoked 1975-1986, up to 1.5 ppd  Substance and Sexual Activity  . Alcohol use: No  . Drug use: No  . Sexual activity: Not on file  Other Topics Concern  . Not on file  Social History Narrative   Exercise: 2-4 times a week   Social Determinants of Health   Financial Resource Strain:   . Difficulty of Paying Living Expenses: Not on file  Food Insecurity:   . Worried About Charity fundraiser in the Last Year: Not on file  . Ran Out of Food in the Last Year: Not on file  Transportation Needs:   . Lack of Transportation (Medical): Not on file  . Lack of Transportation (Non-Medical): Not on file  Physical Activity:   . Days of Exercise per Week: Not on file  . Minutes of Exercise per Session: Not on file  Stress:   . Feeling of Stress : Not on file  Social Connections:   . Frequency of Communication with Friends and Family: Not on file  . Frequency of Social Gatherings with Friends and Family: Not on file  . Attends Religious Services: Not on file  . Active Member of Clubs or Organizations: Not on file  . Attends Archivist Meetings: Not on file  . Marital Status: Not on file  Intimate Partner Violence:   . Fear of Current or Ex-Partner: Not on file  . Emotionally Abused: Not on file  . Physically Abused: Not on file  . Sexually Abused: Not on file     PHYSICAL EXAM  GENERAL EXAM/CONSTITUTIONAL: Vitals:  Vitals:   11/20/19 1123  BP: 134/82  Pulse: 63  Weight: 198 lb 9.6 oz (90.1 kg)  Height: 5\' 5"  (1.651 m)     Body mass index is 33.05 kg/m. Wt Readings from Last 3 Encounters:  11/20/19 198 lb 9.6 oz (90.1 kg)  11/15/19 196 lb (88.9 kg)  11/04/19 199 lb (90.3 kg)      Patient is in no distress; well developed, nourished and groomed; neck is supple  CARDIOVASCULAR:  Examination of carotid arteries is normal; no carotid bruits  Regular rate and rhythm, no murmurs  Examination of peripheral vascular system by observation and palpation is normal  EYES:  Ophthalmoscopic exam of optic discs and posterior segments is normal; no papilledema or hemorrhages  No exam data present  MUSCULOSKELETAL:  Gait, strength, tone, movements noted in Neurologic exam below  NEUROLOGIC: MENTAL STATUS:  No flowsheet data found.  awake, alert, oriented to person, place and time  recent and remote memory intact  normal attention and concentration  language fluent, comprehension intact, naming intact  fund of knowledge appropriate  CRANIAL NERVE:   2nd - no papilledema on fundoscopic exam  2nd, 3rd, 4th, 6th - pupils equal and reactive to light, visual fields full to confrontation, extraocular muscles intact, no nystagmus  5th - facial sensation symmetric  7th - facial strength symmetric  8th - hearing intact  9th - palate elevates symmetrically, uvula midline  11th - shoulder shrug symmetric  12th - tongue protrusion midline  MOTOR:   normal bulk and tone, full strength in the BUE, BLE  SENSORY:   normal and symmetric to light touch, temperature, vibration  COORDINATION:   finger-nose-finger, fine finger movements normal  REFLEXES:   deep tendon reflexes present and symmetric  GAIT/STATION:   narrow based gait     DIAGNOSTIC DATA (LABS, IMAGING, TESTING) - I reviewed patient records, labs, notes, testing and imaging myself where available.  Lab Results  Component Value Date   WBC 10.2 11/10/2019   HGB 14.1 11/10/2019   HCT 44.6 11/10/2019   MCV 91.0 11/10/2019   PLT 303 11/10/2019      Component Value Date/Time   NA 138 11/10/2019 0032   K 4.0 11/10/2019 0032   CL 99 11/10/2019 0032   CO2 28 11/10/2019 0032    GLUCOSE 104 (H) 11/10/2019 0032   GLUCOSE 77 01/24/2006 1211   BUN 19 11/10/2019 0032   CREATININE 0.89 11/10/2019 0032   CALCIUM 9.6 11/10/2019 0032   PROT 7.0 09/06/2019 1420   ALBUMIN 4.3 09/06/2019 1420   AST 28 09/06/2019 1420   ALT 21 09/06/2019 1420   ALKPHOS 105 09/06/2019 1420   BILITOT 0.4 09/06/2019 1420   GFRNONAA >60 11/10/2019 0032   GFRAA >60 11/10/2019 0032   Lab Results  Component Value Date   CHOL 240 (H) 09/06/2019   HDL 52.00 09/06/2019   LDLCALC 169 (H) 07/27/2018   LDLDIRECT 150.0 09/06/2019   TRIG 235.0 (H) 09/06/2019   CHOLHDL 5 09/06/2019   Lab Results  Component Value Date   HGBA1C 5.5 09/17/2015   Lab Results  Component Value Date   VITAMINB12 1,115 (H) 09/06/2019   Lab Results  Component Value Date   TSH 2.89 09/06/2019       ASSESSMENT AND PLAN  64 y.o. year old female here with:  Dx:  1. Chronic fatigue   2. Daily headache   3. Numbness   4. Tremor     PLAN:  - check MRI brain - then consider migraine treatments  Orders Placed This Encounter  Procedures  . MR BRAIN W WO CONTRAST   Return in about 6 months (around 05/19/2020).    Penni Bombard, MD 09/13/7104, 26:94 PM Certified in Neurology, Neurophysiology and [facility] [address] [place], [place] [address] [phone]

## 2019-11-21 MED ORDER — ALPRAZOLAM 0.5 MG PO TABS: ORAL_TABLET | ORAL | 0 refills | Status: DC

## 2019-11-21 MED FILL — ALPRAZolam 0.5 MG TABS: 0.5 | 1 days supply | Qty: 3 | Fill #0

## 2019-11-21 NOTE — Telephone Encounter (Signed)
Meds ordered this encounter  Medications  . ALPRAZolam (XANAX) 0.5 MG tablet    Sig: for sedation before MRI scan; take 1 tab 1 hour before scan; may repeat 1 tab 15 min before scan    Dispense:  3 tablet    Refill:  0    Penni Bombard, MD 04/18/2710, 9:29 PM Certified in Neurology, Neurophysiology and Neuroimaging  [facility] [address] [place], [place] [address] [phone]

## 2019-11-21 NOTE — Telephone Encounter (Signed)
I spoke with the patient and let her know Xanax was sent to her pharmacy for MRI. Pt aware of the instructions which includes not driving. Her questions were answered. She verbalized appreciation.

## 2019-11-21 NOTE — Telephone Encounter (Signed)
x2 lvm for pt to call back about scheduling mri  

## 2019-11-21 NOTE — Telephone Encounter (Signed)
She also informed me she is claustrophobic and would like something to help her. She is aware to have a driver.

## 2019-11-21 NOTE — Addendum Note (Signed)
Addended by: Andrey Spearman R on: 11/21/2019 04:24 PM   Modules accepted: Orders

## 2019-11-21 NOTE — Telephone Encounter (Signed)
Patient returned my call she is scheduled at [facility] for 11/27/19.

## 2019-11-26 ENCOUNTER — Telehealth: Payer: Self-pay | Admitting: Cardiology

## 2019-11-26 NOTE — Telephone Encounter (Signed)
Incoming call, pt complaining of active chest pain that kept getting worse and worse so she was unable to go to work this morning. She reports no other symptoms besides flushing and a head ache.  She reports the last time this happened she sat in the ED for 12 hours and her EKG was fine, she is currently waiting on a CTA to be scheduled.  Notified that with all active chest pain we advise going to the ED to be evaluated. Pt states she would like to avoid this if possible.  Went and spoke with Dr.Christopher's nurse who also agreed and stated that per our protocol we suggest any active chest pain go to the ED to be evaluated.  Advised pt again that with active chest pain we suggest going to the ED. Pt verbalized understanding with no other questions at this time.

## 2019-11-26 NOTE — Telephone Encounter (Signed)
Pt c/o of Chest Pain: STAT if CP now or developed within 24 hours  1. Are you having CP right now? She is, but not as bad as a couple of weeks ago   2. Are you experiencing any other symptoms (ex. SOB, nausea, vomiting, sweating)? no  3. How long have you been experiencing CP? About an hour  4. Is your CP continuous or coming and going? continuous  5. Have you taken Nitroglycerin? No, she does have it.    Patient states it's the same pain she was having before she came in to see Dr. Harrell Gave but it's not as bad. ?

## 2019-11-27 ENCOUNTER — Ambulatory Visit: Payer: 59

## 2019-11-27 DIAGNOSIS — R251 Tremor, unspecified: Secondary | ICD-10-CM

## 2019-11-27 DIAGNOSIS — R200 Anesthesia of skin: Secondary | ICD-10-CM | POA: Diagnosis not present

## 2019-11-27 DIAGNOSIS — R519 Headache, unspecified: Secondary | ICD-10-CM

## 2019-11-27 DIAGNOSIS — R5382 Chronic fatigue, unspecified: Secondary | ICD-10-CM | POA: Diagnosis not present

## 2019-11-27 MED ORDER — GADOBENATE DIMEGLUMINE 529 MG/ML IV SOLN
15.0000 mL | Freq: Once | INTRAVENOUS | Status: AC | PRN
  Administered 2019-11-27: 15 mL via INTRAVENOUS

## 2019-12-02 ENCOUNTER — Encounter: Payer: Self-pay | Admitting: *Deleted

## 2019-12-02 DIAGNOSIS — R079 Chest pain, unspecified: Secondary | ICD-10-CM | POA: Diagnosis not present

## 2019-12-02 DIAGNOSIS — K589 Irritable bowel syndrome without diarrhea: Secondary | ICD-10-CM | POA: Diagnosis not present

## 2019-12-02 DIAGNOSIS — K219 Gastro-esophageal reflux disease without esophagitis: Secondary | ICD-10-CM | POA: Diagnosis not present

## 2019-12-02 DIAGNOSIS — Z86010 Personal history of colonic polyps: Secondary | ICD-10-CM | POA: Diagnosis not present

## 2019-12-02 MED FILL — PANTOPRAZOLE SOD DR 40 MG T: 40 | 30 days supply | Qty: 30 | Fill #0

## 2019-12-04 ENCOUNTER — Telehealth (HOSPITAL_COMMUNITY): Payer: Self-pay | Admitting: Emergency Medicine

## 2019-12-04 MED ORDER — TOPIRAMATE 50 MG PO TABS: 50.0000 mg | ORAL_TABLET | Freq: Two times a day (BID) | ORAL | 12 refills | Status: DC

## 2019-12-04 MED FILL — TOPIRAMATE 50 MG TABLET: 50 | 30 days supply | Qty: 60 | Fill #0

## 2019-12-04 NOTE — Telephone Encounter (Signed)
Attempted to call patient regarding upcoming cardiac CT appointment. °Left message on voicemail with name and callback number °Charlotta Lapaglia RN Navigator Cardiac Imaging °[facility] °[phone] Office °[phone] Cell ° °

## 2019-12-04 NOTE — Telephone Encounter (Signed)
  MIGRAINE PREVENTION  LIFESTYLE CHANGES -Stop or avoid smoking -Decrease or avoid caffeine / alcohol -Eat and sleep on a regular schedule -Exercise several times per week  - start topiramate 50mg  at bedtime; after 1-2 weeks increase to 50mg  twice a day; drink plenty of water; low risk risk for kidney stones   MIGRAINE RESCUE  - ibuprofen, tylenol as needed   Meds ordered this encounter  Medications  . topiramate (TOPAMAX) 50 MG tablet    Sig: Take 1 tablet (50 mg total) by mouth 2 (two) times daily.    Dispense:  60 tablet    Refill:  [place], MD 8/00/4471, 5:80 PM Certified in Neurology, Neurophysiology and Neuroimaging  [facility] [address] [place], [place] [address] [phone]

## 2019-12-05 ENCOUNTER — Telehealth (HOSPITAL_COMMUNITY): Payer: Self-pay | Admitting: Emergency Medicine

## 2019-12-05 NOTE — Telephone Encounter (Signed)
Pt returning phone call regarding upcoming cardiac imaging study; pt verbalizes understanding of appt date/time, parking situation and where to check in, pre-test NPO status and medications ordered, and verified current allergies; name and call back number provided for further questions should they arise Lexii Walsh RN Navigator Cardiac Imaging [facility] [phone] office [phone] cell   

## 2019-12-06 ENCOUNTER — Other Ambulatory Visit: Payer: Self-pay

## 2019-12-06 ENCOUNTER — Ambulatory Visit (HOSPITAL_COMMUNITY)
Admission: RE | Admit: 2019-12-06 | Discharge: 2019-12-06 | Disposition: A | Discharge status: Home / Self Care | Payer: 59 | Source: Ambulatory Visit | Attending: Cardiology | Admitting: Cardiology

## 2019-12-06 DIAGNOSIS — R072 Precordial pain: Secondary | ICD-10-CM | POA: Insufficient documentation

## 2019-12-06 MED ORDER — IOHEXOL 350 MG/ML SOLN
80.0000 mL | Freq: Once | INTRAVENOUS | Status: AC | PRN
  Administered 2019-12-06: 80 mL via INTRAVENOUS

## 2019-12-06 MED ORDER — NITROGLYCERIN 0.4 MG SL SUBL: 0.8000 mg | SUBLINGUAL_TABLET | Freq: Once | SUBLINGUAL | Status: DC

## 2019-12-06 MED ORDER — NITROGLYCERIN 0.4 MG SL SUBL
SUBLINGUAL_TABLET | SUBLINGUAL | Status: AC
  Filled 2019-12-06: qty 2

## 2019-12-11 DIAGNOSIS — H35363 Drusen (degenerative) of macula, bilateral: Secondary | ICD-10-CM | POA: Diagnosis not present

## 2019-12-11 DIAGNOSIS — H43811 Vitreous degeneration, right eye: Secondary | ICD-10-CM | POA: Diagnosis not present

## 2019-12-11 DIAGNOSIS — H25013 Cortical age-related cataract, bilateral: Secondary | ICD-10-CM | POA: Diagnosis not present

## 2019-12-11 DIAGNOSIS — H40013 Open angle with borderline findings, low risk, bilateral: Secondary | ICD-10-CM | POA: Diagnosis not present

## 2019-12-13 MED FILL — FLUoxetine HCL 20 MG CAPS: 20 | 90 days supply | Qty: 90 | Fill #2

## 2019-12-13 MED FILL — VALACYCLOVIR HCL 500 MG TAB: 500 | 90 days supply | Qty: 90 | Fill #1

## 2019-12-15 ENCOUNTER — Encounter: Payer: Self-pay | Admitting: Internal Medicine

## 2020-01-10 ENCOUNTER — Ambulatory Visit (HOSPITAL_COMMUNITY)
Admission: EM | Admit: 2020-01-10 | Discharge: 2020-01-10 | Disposition: A | Discharge status: Home / Self Care | Payer: 59

## 2020-01-10 ENCOUNTER — Telehealth: Payer: Self-pay | Admitting: Internal Medicine

## 2020-01-10 ENCOUNTER — Encounter (HOSPITAL_COMMUNITY): Payer: Self-pay

## 2020-01-10 DIAGNOSIS — T7840XD Allergy, unspecified, subsequent encounter: Secondary | ICD-10-CM

## 2020-01-10 MED ORDER — METHYLPREDNISOLONE SODIUM SUCC 125 MG IJ SOLR
INTRAMUSCULAR | Status: AC
  Filled 2020-01-10: qty 2

## 2020-01-10 MED ORDER — METHYLPREDNISOLONE SODIUM SUCC 125 MG IJ SOLR
80.0000 mg | Freq: Once | INTRAMUSCULAR | Status: AC
  Administered 2020-01-10: 80 mg via INTRAMUSCULAR

## 2020-01-10 MED FILL — ALBUTEROL SULFATE HFA 108 (: 108 (90 BAS | 16 days supply | Qty: 18 | Fill #1

## 2020-01-10 NOTE — Telephone Encounter (Signed)
Patient spoke with [facility] and they recommended that she go to ED, patient went to urgent care.

## 2020-01-10 NOTE — Discharge Instructions (Addendum)
Steroid injection given here today for letter reaction.  He can take Zyrtec or Claritin daily and Benadryl as needed. Recommend follow-up with the allergist, I have put in referral  ER for worsening symptoms.

## 2020-01-10 NOTE — ED Triage Notes (Signed)
Pt states she awakened in the middle of night to go to the bathroom and felt "like my throat was getting sore." Pt noticed her tongue was swollen and reports she had a similar episode of "allergic reaction" with only symptom being a swollen tongue in August.  Pt also reports enlarged/tender lymph nodes under "jaw".  Denies n/v, abdominal pain, SOB, wheezes, no albuterol inhaler use.   States she took 50mg  Benadryl suspension at approx 0400 and feels her tongue has improved slightly in edema.  Tongue edematous, lungs CTA bilaterally, airway patent, v/s stable. Notified N. Burky and Dr. Meda Coffee of pt status. Await further orders. Pt reports she has been having GI "issues' and migraines recently. Has not had allergy testing for more than 20 years.

## 2020-01-12 ENCOUNTER — Encounter: Payer: Self-pay | Admitting: Cardiology

## 2020-01-13 NOTE — ED Provider Notes (Signed)
[name]    CSN: [id number] Arrival date & time: 01/10/20  6283      History   Chief Complaint Chief Complaint  Patient presents with  . Allergic Reaction    HPI Dana Mccoy is a 64 y.o. female.   Patient is a 64 year old female presents today with possible allergic reaction.  Reported she woke up in the night to go the bathroom felt like her throat was getting sore and noticed her tongue was swollen.  She has had a similar episode of tongue swelling back in August.  Also having some mild tenderness and swelling to lymph nodes.  Denies any shortness of breath.  She took 50 mg of Benadryl and feels like the swelling has decreased.  No trouble swallowing or breathing at this time.  No fevers.  Has had allergy testing in the past and has been told she had a nut allergy.     Past Medical History:  Diagnosis Date  . GERD (gastroesophageal reflux disease)   . Hyperlipidemia   . Hypertension   . Reactive airway disease    post CAP    Patient Active Problem List   Diagnosis Date Noted  . DDD (degenerative disc disease), cervical 09/24/2019  . Numbness and tingling 09/06/2019  . Low iron 09/06/2019  . Tremor 09/06/2019  . Restless leg syndrome 07/27/2018  . Chronic pain of left knee 04/17/2018  . Right low back pain 04/17/2018  . Fatigue 08/21/2017  . Bradycardia 08/21/2017  . Atypical chest pain 08/21/2017  . Anxiety 12/08/2013  . Obesity (BMI 30-39.9) 08/02/2013  . Rhinitis 08/02/2013  . Cervical stenosis of spine 04/26/2011  . Hyperlipidemia 06/05/2007  . Reactive airway disease 06/05/2007  . GERD 06/05/2007    Past Surgical History:  Procedure Laterality Date  . CARDIAC CATHETERIZATION  1997   negative  . COLONOSCOPY  2007   Dr Cristina Gong  . DILATION AND CURETTAGE OF UTERUS    . UPPER GI ENDOSCOPY     X 2  . WISDOM TOOTH EXTRACTION      OB History   No obstetric history on file.      Home Medications    Prior to Admission  medications   Medication Sig Start Date End Date Taking? Authorizing Provider  albuterol (VENTOLIN HFA) 108 (90 Base) MCG/ACT inhaler INHALE 2 PUFFS BY MOUTH EVERY 4 TO 6 HOURS AS NEEDED FOR COUGH OR WHEEZE *MAY USE 10-20 MIN PRIOR TO EXERCISE 01/17/19  Yes Burns, Claudina Lick, MD  BREO ELLIPTA 100-25 MCG/INH AEPB INHALE 1 PUFF BY MOUTH DAILY AS NEEDED. 04/15/19  Yes Burns, Claudina Lick, MD  FLUoxetine (PROZAC) 20 MG capsule Take 20 mg by mouth daily. 09/12/19  Yes [provider]  fluticasone (VERAMYST) 27.5 MCG/SPRAY nasal spray Place 2 sprays into the nose daily.   Yes [provider]  hydrochlorothiazide (MICROZIDE) 12.5 MG capsule TAKE 1 CAPSULE BY MOUTH ONCE DAILY 11/20/19  Yes Burns, Claudina Lick, MD  loratadine (CLARITIN) 10 MG tablet Take 10 mg by mouth daily as needed for allergies.   Yes [provider]  pantoprazole (PROTONIX) 40 MG tablet Take 40 mg by mouth daily. 12/29/19  Yes [provider]  acetaminophen (TYLENOL) 500 MG tablet Take 500 mg by mouth every 6 (six) hours as needed.    [provider]  Loperamide HCl (LOPERAMIDE A-D PO) Take by mouth. Patient takes 3xs a week as needed    [provider]  mometasone (ELOCON) 0.1 % cream  mometasone 0.1 % topical cream  APPLY A THIN LAYER TO THE AFFECTED AREA OF THE SKIN DAILY    [provider]  phenylephrine (SUDAFED PE) 10 MG TABS tablet Take 10 mg by mouth as needed.    [provider]  Probiotic Product (ALIGN PO) Take by mouth daily.    [provider]  tizanidine (ZANAFLEX) 2 MG capsule Take 1 capsule (2 mg total) by mouth 3 (three) times daily as needed for muscle spasms. 09/24/19   Janith Lima, MD  topiramate (TOPAMAX) 50 MG tablet Take 1 tablet (50 mg total) by mouth 2 (two) times daily. 12/04/19   Penumalli, Earlean Polka, MD  valACYclovir (VALTREX) 500 MG tablet TAKE 1 TABLET BY MOUTH DAILY. 09/13/19   Binnie Rail, MD  omeprazole (PRILOSEC) 40 MG capsule TAKE 1 CAPSULE  BY MOUTH ONCE DAILY 10/08/19 01/10/20  Binnie Rail, MD    Family History Family History  Adopted: Yes    Social History Social History   Tobacco Use  . Smoking status: Former Smoker    Quit date: 03/14/1984    Years since quitting: 35.8  . Smokeless tobacco: Never Used  . Tobacco comment: smoked 1975-1986, up to 1.5 ppd  Substance Use Topics  . Alcohol use: No  . Drug use: No     Allergies   Penicillins and Lipitor [atorvastatin]   Review of Systems Review of Systems   Physical Exam Triage Vital Signs ED Triage Vitals  Enc Vitals Group     BP 01/10/20 0846 (!) 161/92     Pulse Rate 01/10/20 0846 60     Resp 01/10/20 0846 18     Temp 01/10/20 0846 98.5 F (36.9 C)     Temp Source 01/10/20 0846 Oral     SpO2 01/10/20 0846 98 %     Weight --      Height --      Head Circumference --      Peak Flow --      Pain Score 01/10/20 0851 0     Pain Loc --      Pain Edu? --      Excl. in [place]? --    No data found.  Updated Vital Signs BP (!) 161/92 (BP Location: Left Arm)   Pulse 60   Temp 98.5 F (36.9 C) (Oral)   Resp 18   SpO2 98%   Visual Acuity Right Eye Distance:   Left Eye Distance:   Bilateral Distance:    Right Eye Near:   Left Eye Near:    Bilateral Near:     Physical Exam Vitals and nursing note reviewed.  Constitutional:      General: She is not in acute distress.    Appearance: Normal appearance. She is not ill-appearing, toxic-appearing or diaphoretic.  HENT:     Head: Normocephalic.     Nose: Nose normal.     Mouth/Throat:     Pharynx: Oropharynx is clear.     Comments: Mild  tongue swelling  Eyes:     Conjunctiva/sclera: Conjunctivae normal.  Pulmonary:     Effort: Pulmonary effort is normal.  Musculoskeletal:        General: Normal range of motion.     Cervical back: Normal range of motion.  Skin:    General: Skin is warm and dry.     Findings: No rash.  Neurological:     Mental Status: She is alert.  Psychiatric:  Mood and Affect: Mood normal.      UC Treatments / Results  Labs (all labs ordered are listed, but only abnormal results are displayed) Labs Reviewed - No data to display  EKG   Radiology No results found.  Procedures Procedures (including critical care time)  Medications Ordered in UC Medications  methylPREDNISolone sodium succinate (SOLU-MEDROL) 125 mg/2 mL injection 80 mg (80 mg Intramuscular Given 01/10/20 0926)    Initial Impression / Assessment and Plan / UC Course  I have reviewed the triage vital signs and the nursing notes.  Pertinent labs & imaging results that were available during my care of the patient were reviewed by me and considered in my medical decision making (see chart for details).     Allergic reaction Mild tongue swelling on exam.  Airway patent. Likely some sort of allergic reaction to nuts. Recommended follow-up with the allergist.  Steroid injection given here today.  Zyrtec, Claritin or Benadryl as needed. Follow up as needed for continued or worsening symptoms  Final Clinical Impressions(s) / UC Diagnoses   Final diagnoses:  Allergic reaction, subsequent encounter     Discharge Instructions     Steroid injection given here today for letter reaction.  He can take Zyrtec or Claritin daily and Benadryl as needed. Recommend follow-up with the allergist, I have put in referral  ER for worsening symptoms.     ED Prescriptions    None     PDMP not reviewed this encounter.   Orvan July, NP 01/13/20 [phone]

## 2020-01-16 NOTE — Progress Notes (Signed)
Subjective:    Patient ID: Dana Mccoy, female    DOB: Nov 19, 1955, 64 y.o.   MRN: [id number]  HPI The patient is here for an acute visit to discuss health.  She is here primarily to get advice on everything that happened over the past few months.  She feels like she just needed to balance some things off from someone.   August - tongue swelled, lips tingling.  Went to ED.  Had steroid injection and rx'd steroid taper and benadryl.  Next week had chest pain - ? esophageal spasms which she had int the past.  She saw cardio - w/u neg for cardiac cause.    She has appt with GI next week.  Has had IBS like symptoms w/o obvious cause.  occ swallows wrong and chokes.     Last Friday woke up and throat felt sore and glands were swollen.  Tongue was swollen again.  She took liquid benadryl.  She went to urgent care - given solumedrol.  She has an appt with an allergist.     He is also been having headaches that can last a couple of hours or over a day.  She did see a neurologist who thought these were likely migraines and wanted her to start Topamax, which she has not started.  She is concerned about taking another medication if she does not need it.  The neurologist did ask her to stop all over-the-counter medications for her pain because of concern with rebound headaches.   Medications and allergies reviewed with patient and updated if appropriate.  Patient Active Problem List   Diagnosis Date Noted  . DDD (degenerative disc disease), cervical 09/24/2019  . Numbness and tingling 09/06/2019  . Low iron 09/06/2019  . Tremor 09/06/2019  . Restless leg syndrome 07/27/2018  . Chronic pain of left knee 04/17/2018  . Right low back pain 04/17/2018  . Fatigue 08/21/2017  . Bradycardia 08/21/2017  . Atypical chest pain 08/21/2017  . Anxiety 12/08/2013  . Obesity (BMI 30-39.9) 08/02/2013  . Rhinitis 08/02/2013  . Cervical stenosis of spine 04/26/2011  . Hyperlipidemia 06/05/2007   . Reactive airway disease 06/05/2007  . GERD 06/05/2007    Current Outpatient Medications on File Prior to Visit  Medication Sig Dispense Refill  . acetaminophen (TYLENOL) 500 MG tablet Take 500 mg by mouth every 6 (six) hours as needed.    Marland Kitchen albuterol (VENTOLIN HFA) 108 (90 Base) MCG/ACT inhaler INHALE 2 PUFFS BY MOUTH EVERY 4 TO 6 HOURS AS NEEDED FOR COUGH OR WHEEZE *MAY USE 10-20 MIN PRIOR TO EXERCISE 18 g 3  . BREO ELLIPTA 100-25 MCG/INH AEPB INHALE 1 PUFF BY MOUTH DAILY AS NEEDED. 60 each 3  . famotidine (PEPCID) 20 MG tablet Take 20 mg by mouth 2 (two) times daily.    Marland Kitchen FLUoxetine (PROZAC) 20 MG capsule Take 20 mg by mouth daily.    . fluticasone (VERAMYST) 27.5 MCG/SPRAY nasal spray Place 2 sprays into the nose daily.    . hydrochlorothiazide (MICROZIDE) 12.5 MG capsule TAKE 1 CAPSULE BY MOUTH ONCE DAILY 90 capsule 2  . Loperamide HCl (LOPERAMIDE A-D PO) Take by mouth. Patient takes 3xs a week as needed    . loratadine (CLARITIN) 10 MG tablet Take 10 mg by mouth daily as needed for allergies.    . mometasone (ELOCON) 0.1 % cream mometasone 0.1 % topical cream  APPLY A THIN LAYER TO THE AFFECTED AREA OF THE SKIN DAILY    .  pantoprazole (PROTONIX) 40 MG tablet Take 40 mg by mouth daily.    . Probiotic Product (ALIGN PO) Take by mouth daily.    . tizanidine (ZANAFLEX) 2 MG capsule Take 1 capsule (2 mg total) by mouth 3 (three) times daily as needed for muscle spasms. 270 capsule 0  . valACYclovir (VALTREX) 500 MG tablet TAKE 1 TABLET BY MOUTH DAILY. 90 tablet 3  . topiramate (TOPAMAX) 50 MG tablet Take 1 tablet (50 mg total) by mouth 2 (two) times daily. (Patient not taking: Reported on 01/17/2020) 60 tablet 12  . [DISCONTINUED] omeprazole (PRILOSEC) 40 MG capsule TAKE 1 CAPSULE BY MOUTH ONCE DAILY 90 capsule 0   No current facility-administered medications on file prior to visit.    Past Medical History:  Diagnosis Date  . GERD (gastroesophageal reflux disease)   . Hyperlipidemia    . Hypertension   . Reactive airway disease    post CAP    Past Surgical History:  Procedure Laterality Date  . CARDIAC CATHETERIZATION  1997   negative  . COLONOSCOPY  2007   Dr Cristina Gong  . DILATION AND CURETTAGE OF UTERUS    . UPPER GI ENDOSCOPY     X 2  . WISDOM TOOTH EXTRACTION      Social History   Socioeconomic History  . Marital status: Divorced    Spouse name: Not on file  . Number of children: Not on file  . Years of education: Not on file  . Highest education level: Bachelor's degree (e.g., BA, AB, BS)  Occupational History    Comment: [employer]  Tobacco Use  . Smoking status: Former Smoker    Quit date: 03/14/1984    Years since quitting: 35.8  . Smokeless tobacco: Never Used  . Tobacco comment: smoked 1975-1986, up to 1.5 ppd  Substance and Sexual Activity  . Alcohol use: No  . Drug use: No  . Sexual activity: Not on file  Other Topics Concern  . Not on file  Social History Narrative   Exercise: 2-4 times a week   Social Determinants of Health   Financial Resource Strain:   . Difficulty of Paying Living Expenses: Not on file  Food Insecurity:   . Worried About Charity fundraiser in the Last Year: Not on file  . Ran Out of Food in the Last Year: Not on file  Transportation Needs:   . Lack of Transportation (Medical): Not on file  . Lack of Transportation (Non-Medical): Not on file  Physical Activity:   . Days of Exercise per Week: Not on file  . Minutes of Exercise per Session: Not on file  Stress:   . Feeling of Stress : Not on file  Social Connections:   . Frequency of Communication with Friends and Family: Not on file  . Frequency of Social Gatherings with Friends and Family: Not on file  . Attends Religious Services: Not on file  . Active Member of Clubs or Organizations: Not on file  . Attends Archivist Meetings: Not on file  . Marital Status: Not on file    Family History  Adopted: Yes    Review of Systems  HENT:  Negative for trouble swallowing.        No throat swelling  Gastrointestinal: Positive for nausea.       Jerrye Bushy occ  Neurological: Positive for headaches (4 days a week - can last a couple of hrs or much longer).       Objective:  Vitals:   01/17/20 1553  BP: 124/80  Pulse: 72  Temp: 98.1 F (36.7 C)  SpO2: 97%   BP Readings from Last 3 Encounters:  01/17/20 124/80  01/10/20 (!) 161/92  12/06/19 138/73   Wt Readings from Last 3 Encounters:  01/17/20 198 lb (89.8 kg)  11/20/19 198 lb 9.6 oz (90.1 kg)  11/15/19 196 lb (88.9 kg)   Body mass index is 32.95 kg/m.   Physical Exam         Assessment & Plan:    See Problem List for Assessment and Plan of chronic medical problems.    This visit occurred during the SARS-CoV-2 public health emergency.  Safety protocols were in place, including screening questions prior to the visit, additional usage of staff PPE, and extensive cleaning of exam room while observing appropriate contact time as indicated for disinfecting solutions.

## 2020-01-17 ENCOUNTER — Ambulatory Visit: Payer: 59 | Admitting: Internal Medicine

## 2020-01-17 ENCOUNTER — Encounter: Payer: Self-pay | Admitting: Internal Medicine

## 2020-01-17 ENCOUNTER — Other Ambulatory Visit: Payer: Self-pay

## 2020-01-17 DIAGNOSIS — T783XXA Angioneurotic edema, initial encounter: Secondary | ICD-10-CM

## 2020-01-17 DIAGNOSIS — R519 Headache, unspecified: Secondary | ICD-10-CM

## 2020-01-17 NOTE — Assessment & Plan Note (Signed)
Has had a couple of episodes without obvious cause Had to go to the emergency room for one episode in urgent care for the other Has an appoint with allergy We will hold off on EpiPen until after she sees allergy since she wants to be able to go to urgent care would not have to go to the emergency room so most likely she would not use the EpiPen

## 2020-01-17 NOTE — Assessment & Plan Note (Signed)
Chronic Has not noticed She did see neurology and he thinks she may have migraines He wanted her to take Topamax, but she is holding off-we will wait until she is evaluated by the allergist.

## 2020-01-24 ENCOUNTER — Other Ambulatory Visit: Payer: Self-pay | Admitting: Internal Medicine

## 2020-01-24 DIAGNOSIS — K219 Gastro-esophageal reflux disease without esophagitis: Secondary | ICD-10-CM | POA: Diagnosis not present

## 2020-01-24 DIAGNOSIS — K589 Irritable bowel syndrome without diarrhea: Secondary | ICD-10-CM | POA: Diagnosis not present

## 2020-01-24 DIAGNOSIS — R079 Chest pain, unspecified: Secondary | ICD-10-CM | POA: Diagnosis not present

## 2020-01-24 MED FILL — PANTOPRAZOLE SOD DR 40 MG T: 40 | 90 days supply | Qty: 180 | Fill #0

## 2020-01-24 MED FILL — BREO ELLIPTA 100-25 MCG INH: 100-25 | 30 days supply | Qty: 60 | Fill #0

## 2020-01-30 ENCOUNTER — Other Ambulatory Visit: Payer: Self-pay

## 2020-01-30 ENCOUNTER — Other Ambulatory Visit: Payer: Self-pay | Admitting: Allergy

## 2020-01-30 ENCOUNTER — Encounter: Payer: Self-pay | Admitting: Allergy

## 2020-01-30 ENCOUNTER — Ambulatory Visit: Payer: 59 | Admitting: Allergy

## 2020-01-30 VITALS — BP 118/76 | HR 62 | Temp 98.3°F | Resp 16 | Ht 64.21 in | Wt 198.2 lb

## 2020-01-30 DIAGNOSIS — J4540 Moderate persistent asthma, uncomplicated: Secondary | ICD-10-CM | POA: Diagnosis not present

## 2020-01-30 DIAGNOSIS — J3089 Other allergic rhinitis: Secondary | ICD-10-CM

## 2020-01-30 DIAGNOSIS — T781XXA Other adverse food reactions, not elsewhere classified, initial encounter: Secondary | ICD-10-CM | POA: Insufficient documentation

## 2020-01-30 DIAGNOSIS — T783XXD Angioneurotic edema, subsequent encounter: Secondary | ICD-10-CM | POA: Diagnosis not present

## 2020-01-30 DIAGNOSIS — T781XXD Other adverse food reactions, not elsewhere classified, subsequent encounter: Secondary | ICD-10-CM | POA: Diagnosis not present

## 2020-01-30 DIAGNOSIS — K219 Gastro-esophageal reflux disease without esophagitis: Secondary | ICD-10-CM

## 2020-01-30 MED ORDER — EPINEPHRINE 0.3 MG/0.3ML IJ SOAJ: 0.3000 mg | INTRAMUSCULAR | 2 refills | Status: DC | PRN

## 2020-01-30 MED FILL — EPINEPHRINE 0.3 MG AUTO-INJ: 0.3 | 30 days supply | Qty: 2 | Fill #0

## 2020-01-30 NOTE — Assessment & Plan Note (Signed)
Used to follow with pulmonology and currently on Breo 100 mcg 1 puff daily and using albuterol mainly during upper respiratory infections with good benefit. . Daily controller medication(s): continue Breo 119mcg 1 puff once a day and rinse mouth after each use.  . May use albuterol rescue inhaler 2 puffs every 4 to 6 hours as needed for shortness of breath, chest tightness, coughing, and wheezing. May use albuterol rescue inhaler 2 puffs 5 to 15 minutes prior to strenuous physical activities. Monitor frequency of use.  . Will get spirometry at next visit instead of today due to COVID-19 pandemic and trying to minimize any type of aerosolizing procedures at this time in the office.

## 2020-01-30 NOTE — Assessment & Plan Note (Addendum)
2 episodes of tongue angioedema. Treated with Solu-Medrol and Benadryl with good benefit.  Denies any other associated symptoms.  First episode occurred 30 minutes after eating a peanut butter and bread sandwich.  Second episode occurred in the middle of night and had barbecue sandwich for dinner.  No recent tick bites. Not on ace-inhibitors. Up to date with cancer screening tests.  Previously avoiding tree nuts due to positive testing. Since then had wheat with no issues. Avoiding peanuts and tree nuts now.  Today's skin testing showed: Negative to select foods including, peanuts, tree nuts, beef, pork, wheat. Given above results not sure what caused angioedema.  Keep track of episodes - write down what you had eaten/come across with the past 4-6 hours. Take an antihistamine every day. I have prescribed epinephrine injectable and demonstrated proper use. For mild symptoms you can take over the counter antihistamines such as Benadryl and monitor symptoms closely. If symptoms worsen or if you have severe symptoms including breathing issues, throat closure, significant swelling, whole body hives, severe diarrhea and vomiting, lightheadedness then inject epinephrine and seek immediate medical care afterwards. Emergency action plan given. Get bloodwork to rule out other etiologies.   Continue to avoid peanuts/tree nuts and red meat (pork, beef lamb) for now.

## 2020-01-30 NOTE — Patient Instructions (Addendum)
Today's skin testing showed: Negative to select foods - results given.   Swelling: Keep track of episodes - write down what you had eaten/come across with the past 4-6 hours. Take an antihistamine every day.  I have prescribed epinephrine injectable and demonstrated proper use. For mild symptoms you can take over the counter antihistamines such as Benadryl and monitor symptoms closely. If symptoms worsen or if you have severe symptoms including breathing issues, throat closure, significant swelling, whole body hives, severe diarrhea and vomiting, lightheadedness then inject epinephrine and seek immediate medical care afterwards. Emergency action plan given.  Get bloodwork:  We are ordering labs, so please allow 1-2 weeks for the results to come back. With the newly implemented Cures Act, the labs might be visible to you at the same time that they become visible to me. However, I will not address the results until all of the results are back, so please be patient.  In the meantime, continue recommendations in your patient instructions, including avoidance measures (if applicable), until you hear from me.  Continue to avoid peanuts/tree nuts and red meat (pork, beef lamb) for now.   Environmental allergies  May use over the counter antihistamines such as Zyrtec (cetirizine), Claritin (loratadine), Allegra (fexofenadine), or Xyzal (levocetirizine) daily as needed. May take twice a day during flares.  May use Flonase (fluticasone) nasal spray 1 spray per nostril twice a day as needed for nasal congestion.   Nasal saline spray (i.e., Simply Saline) or nasal saline lavage (i.e., NeilMed) is recommended as needed and prior to medicated nasal sprays.  If symptoms worsen, recommend re-testing in future.   Breathing: . Daily controller medication(s): continue Breo 128mcg 1 puff once a day and rinse mouth after each use.  . May use albuterol rescue inhaler 2 puffs every 4 to 6 hours as needed for  shortness of breath, chest tightness, coughing, and wheezing. May use albuterol rescue inhaler 2 puffs 5 to 15 minutes prior to strenuous physical activities. Monitor frequency of use.  . Breathing control goals:  o Full participation in all desired activities (may need albuterol before activity) o Albuterol use two times or less a week on average (not counting use with activity) o Cough interfering with sleep two times or less a month o Oral steroids no more than once a year o No hospitalizations  Reflux:  See below for lifestyle modifications for reflux.  Continue pantoprazole 40mg  daily.  Follow up in 3 months or sooner if needed.   Gastroesophageal Reflux Disease, Adult Gastroesophageal reflux (GER) happens when acid from the stomach flows up into the tube that connects the mouth and the stomach (esophagus). Normally, food travels down the esophagus and stays in the stomach to be digested. With GER, food and stomach acid sometimes move back up into the esophagus. You may have a disease called gastroesophageal reflux disease (GERD) if the reflux:  Happens often.  Causes frequent or very bad symptoms.  Causes problems such as damage to the esophagus. When this happens, the esophagus becomes sore and swollen (inflamed). Over time, GERD can make small holes (ulcers) in the lining of the esophagus. What are the causes? This condition is caused by a problem with the muscle between the esophagus and the stomach. When this muscle is weak or not normal, it does not close properly to keep food and acid from coming back up from the stomach. The muscle can be weak because of:  Tobacco use.  Pregnancy.  Having a certain type of  hernia (hiatal hernia).  Alcohol use.  Certain foods and drinks, such as coffee, chocolate, onions, and peppermint. What increases the risk? You are more likely to develop this condition if you:  Are overweight.  Have a disease that affects your connective  tissue.  Use NSAID medicines. What are the signs or symptoms? Symptoms of this condition include:  Heartburn.  Difficult or painful swallowing.  The feeling of having a lump in the throat.  A bitter taste in the mouth.  Bad breath.  Having a lot of saliva.  Having an upset or bloated stomach.  Belching.  Chest pain. Different conditions can cause chest pain. Make sure you see your doctor if you have chest pain.  Shortness of breath or noisy breathing (wheezing).  Ongoing (chronic) cough or a cough at night.  Wearing away of the surface of teeth (tooth enamel).  Weight loss. How is this treated? Treatment will depend on how bad your symptoms are. Your doctor may suggest:  Changes to your diet.  Medicine.  Surgery. Follow these instructions at home: Eating and drinking   Follow a diet as told by your doctor. You may need to avoid foods and drinks such as: ? Coffee and tea (with or without caffeine). ? Drinks that contain alcohol. ? Energy drinks and sports drinks. ? Bubbly (carbonated) drinks or sodas. ? Chocolate and cocoa. ? Peppermint and mint flavorings. ? Garlic and onions. ? Horseradish. ? Spicy and acidic foods. These include peppers, chili powder, curry powder, vinegar, hot sauces, and BBQ sauce. ? Citrus fruit juices and citrus fruits, such as oranges, lemons, and limes. ? Tomato-based foods. These include red sauce, chili, salsa, and pizza with red sauce. ? Fried and fatty foods. These include donuts, french fries, potato chips, and high-fat dressings. ? High-fat meats. These include hot dogs, rib eye steak, sausage, ham, and bacon. ? High-fat dairy items, such as whole milk, butter, and cream cheese.  Eat small meals often. Avoid eating large meals.  Avoid drinking large amounts of liquid with your meals.  Avoid eating meals during the 2-3 hours before bedtime.  Avoid lying down right after you eat.  Do not exercise right after you  eat. Lifestyle   Do not use any products that contain nicotine or tobacco. These include cigarettes, e-cigarettes, and chewing tobacco. If you need help quitting, ask your doctor.  Try to lower your stress. If you need help doing this, ask your doctor.  If you are overweight, lose an amount of weight that is healthy for you. Ask your doctor about a safe weight loss goal. General instructions  Pay attention to any changes in your symptoms.  Take over-the-counter and prescription medicines only as told by your doctor. Do not take aspirin, ibuprofen, or other NSAIDs unless your doctor says it is okay.  Wear loose clothes. Do not wear anything tight around your waist.  Raise (elevate) the head of your bed about 6 inches (15 cm).  Avoid bending over if this makes your symptoms worse.  Keep all follow-up visits as told by your doctor. This is important. Contact a doctor if:  You have new symptoms.  You lose weight and you do not know why.  You have trouble swallowing or it hurts to swallow.  You have wheezing or a cough that keeps happening.  Your symptoms do not get better with treatment.  You have a hoarse voice. Get help right away if:  You have pain in your arms, neck, jaw, teeth,  or back.  You feel sweaty, dizzy, or light-headed.  You have chest pain or shortness of breath.  You throw up (vomit) and your throw-up looks like blood or coffee grounds.  You pass out (faint).  Your poop (stool) is bloody or black.  You cannot swallow, drink, or eat. Summary  If a person has gastroesophageal reflux disease (GERD), food and stomach acid move back up into the esophagus and cause symptoms or problems such as damage to the esophagus.  Treatment will depend on how bad your symptoms are.  Follow a diet as told by your doctor.  Take all medicines only as told by your doctor. This information is not intended to replace advice given to you by your health care provider. Make  sure you discuss any questions you have with your health care provider. Document Revised: 09/06/2017 Document Reviewed: 09/06/2017 Else[employer] Patient Education  [employer].

## 2020-01-30 NOTE — Assessment & Plan Note (Signed)
   See below for lifestyle modifications for reflux.  Continue pantoprazole 40mg  daily.

## 2020-01-30 NOTE — Progress Notes (Signed)
New Patient Note  RE: Mabell Esguerra MRN: [id number] DOB: [birth year] Date of Office Visit: 01/30/2020  Referring provider: Binnie Rail, MD Primary care provider: Binnie Rail, MD  Chief Complaint: Allergy Testing (environmental and foods, swollen tongue)  History of Present Illness: I had the pleasure of seeing Sinahi Knights for initial evaluation at the [facility] on 01/30/2020. She is a 64 y.o. female, who is referred here by Binnie Rail, MD for the evaluation of allergic reaction.  Patient had 2 episodes of tongue swelling.  First episode occurred at the end of August 2021. Patient was at work in the morning and noticed some tongue angioedema and noted lisping due to this. She took some benadryl and went to the ER where she was given solumedrol and symptoms resolved within 1-2 days. She was also given an oral prednisone taper and took benadryl.  Patient most likely had cereal for breakfast and then had a piece of bread and peanut butter at work. About 30 minutes afterwards noted the above symptoms. Denies any other associated symptoms. She used to eat the above foods previously with no issues. Since then avoiding peanut butter.  Second episode occurred a few weeks ago. She woke up in the middle of the night around 3:30-4AM to use the bathroom and felt like she had a sore throat. She had tongue swelling at this time as well.  She took some benadryl at home and went to the urgent in the morning and received solumedrol.   Patient had BBQ sandwich for dinner with some ice tea. No other snacks prior to going to bed. Symptoms resolved within the same day.   Suspected triggers are unknown. Denies any fevers, chills, changes in medications, foods, personal care products or recent infections. She has tried the following therapies: benadryl and steroids with good benefit. Systemic steroids yes.   Previous work up includes: Last time patient had skin  testing in the past was positive to tree nuts per patient report. Patient has been having IBS symptoms and has been cutting out foods from her diet due to this.   Lately bacon and cheese caused headaches. No recent tick bites.   Patient is up to date with the following cancer screening tests: pap smears, mammogram, colonoscopy.  Dietary History: patient has been eating other foods including lactose free milk, eggs, limited sesame, limited shellfish, fish, soy, wheat, meats, fruits and vegetables.  She reports reading labels and avoiding peanuts, tree nuts in diet completely.   01/10/2020 UC visit: "Patient is a 64 year old female presents today with possible allergic reaction.  Reported she woke up in the night to go the bathroom felt like her throat was getting sore and noticed her tongue was swollen.  She has had a similar episode of tongue swelling back in August.  Also having some mild tenderness and swelling to lymph nodes.  Denies any shortness of breath.  She took 50 mg of Benadryl and feels like the swelling has decreased.  No trouble swallowing or breathing at this time.  No fevers.  Has had allergy testing in the past and has been told she had a nut allergy."  11/04/2019 ER visit: "Patient presents concern of the tongue swelling. Without clear precipitant, about 1 hour prior to ED arrival the patient noticed tongue swelling.  There is associated lip tingling.  There is some difficulty swallowing, speaking, no dyspnea. Patient has no history of similar events/angioedema.  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  Subsequently, she took 50 mg Benadryl, and now presents for evaluation. Earlier in the day, with history of environmental allergies she took Claritin, as well as her other medications, which do not include lisinopril."  Assessment and Plan: Taesha is a 64 y.o. female with: Angioedema 2 episodes  of tongue angioedema. Treated with Solu-Medrol and Benadryl with good benefit.  Denies any other associated symptoms.  First episode occurred 30 minutes after eating a peanut butter and bread sandwich.  Second episode occurred in the middle of night and had barbecue sandwich for dinner.  No recent tick bites. Not on ace-inhibitors. Up to date with cancer screening tests.  Previously avoiding tree nuts due to positive testing. Since then had wheat with no issues. Avoiding peanuts and tree nuts now.  Today's skin testing showed: Negative to select foods including, peanuts, tree nuts, beef, pork, wheat. Given above results not sure what caused angioedema.  Keep track of episodes - write down what you had eaten/come across with the past 4-6 hours. Take an antihistamine every day. I have prescribed epinephrine injectable and demonstrated proper use. For mild symptoms you can take over the counter antihistamines such as Benadryl and monitor symptoms closely. If symptoms worsen or if you have severe symptoms including breathing issues, throat closure, significant swelling, whole body hives, severe diarrhea and vomiting, lightheadedness then inject epinephrine and seek immediate medical care afterwards. Emergency action plan given. Get bloodwork to rule out other etiologies.   Continue to avoid peanuts/tree nuts and red meat (pork, beef lamb) for now.   Other allergic rhinitis Skin testing in the past with multiple positives per patient report.  Takes over-the-counter antihistamines daily with good benefit.  May use over the counter antihistamines such as Zyrtec (cetirizine), Claritin (loratadine), Allegra (fexofenadine), or Xyzal (levocetirizine) daily as needed. May take twice a day during flares.  May use Flonase (fluticasone) nasal spray 1 spray per nostril twice a day as needed for nasal congestion.   Nasal saline spray (i.e., Simply Saline) or nasal saline lavage (i.e., NeilMed) is recommended as  needed and prior to medicated nasal sprays.  If symptoms worsen, recommend re-testing in future.   Reactive airway disease Used to follow with pulmonology and currently on Breo 100 mcg 1 puff daily and using albuterol mainly during upper respiratory infections with good benefit. . Daily controller medication(s): continue Breo 185mcg 1 puff once a day and rinse mouth after each use.  . May use albuterol rescue inhaler 2 puffs every 4 to 6 hours as needed for shortness of breath, chest tightness, coughing, and wheezing. May use albuterol rescue inhaler 2 puffs 5 to 15 minutes prior to strenuous physical activities. Monitor frequency of use.  . Will get spirometry at next visit instead of today due to COVID-19 pandemic and trying to minimize any type of aerosolizing procedures at this time in the office.  Gastroesophageal reflux disease  See below for lifestyle modifications for reflux.  Continue pantoprazole 40mg  daily.  Return in about 3 months (around 05/01/2020).  Meds ordered this encounter  Medications  . EPINEPHrine 0.3 mg/0.3 mL IJ SOAJ injection    Sig: Inject 0.3 mg into the muscle as needed.    Dispense:  1 each    Refill:  2    Lab Orders     C1 esterase inhibitor, functional     C1 Esterase Inhibitor  C3 and C4     Complement component c1q     Tryptase     Sedimentation rate     C-reactive protein     Protein electrophoresis, serum     Protein Electrophoresis, Urine Rflx.     IgE Nut Prof. w/Component Rflx     Alpha-Gal Panel  Other allergy screening: Asthma:  Currently on Breo 139mcg 1 puff daily and using albuterol as needed mainly during URIs. Used to follow with pulmonology.   Rhino conjunctivitis: yes  Patient's last skin testing showed multiple positives per patient report. Takes OTC antihistamines daily.   Medication allergy: yes  Penicillin - rash Hymenoptera allergy: no Urticaria: no Eczema:no History of recurrent infections suggestive of  immunodeficency: no  Diagnostics: Skin Testing: Select foods. Negative test to: select foods - results given.  Results discussed with patient/family.  Food Adult Perc - 01/30/20 1400    Time Antigen Placed 0245    Allergen Manufacturer Lavella Hammock    Location Arm    Number of allergen test 14     Control-buffer 50% Glycerol Negative    Control-Histamine 1 mg/ml 2+    1. Peanut Negative    3. Wheat Negative    10. Cashew Negative    11. Pecan Food Negative    12. [place] Negative    13. Almond Negative    14. Hazelnut Negative    15. [place] nut Negative    16. Coconut Negative    17. Pistachio Negative    37. Pork Negative    40. Beef Negative           Past Medical History: Patient Active Problem List   Diagnosis Date Noted  . Adverse food reaction 01/30/2020  . Angioedema 01/17/2020  . Frequent headaches 01/17/2020  . DDD (degenerative disc disease), cervical 09/24/2019  . Numbness and tingling 09/06/2019  . Low iron 09/06/2019  . Tremor 09/06/2019  . Restless leg syndrome 07/27/2018  . Chronic pain of left knee 04/17/2018  . Right low back pain 04/17/2018  . Fatigue 08/21/2017  . Bradycardia 08/21/2017  . Atypical chest pain 08/21/2017  . Anxiety 12/08/2013  . Obesity (BMI 30-39.9) 08/02/2013  . Other allergic rhinitis 08/02/2013  . Cervical stenosis of spine 04/26/2011  . Hyperlipidemia 06/05/2007  . Reactive airway disease 06/05/2007  . Gastroesophageal reflux disease 06/05/2007   Past Medical History:  Diagnosis Date  . Angio-edema   . GERD (gastroesophageal reflux disease)   . Hyperlipidemia   . Hypertension   . Reactive airway disease    post CAP   Past Surgical History: Past Surgical History:  Procedure Laterality Date  . CARDIAC CATHETERIZATION  1997   negative  . COLONOSCOPY  2007   Dr Cristina Gong  . DILATION AND CURETTAGE OF UTERUS    . UPPER GI ENDOSCOPY     X 2  . WISDOM TOOTH EXTRACTION     Medication List:  Current Outpatient  Medications  Medication Sig Dispense Refill  . acetaminophen (TYLENOL) 500 MG tablet Take 500 mg by mouth every 6 (six) hours as needed.    Marland Kitchen albuterol (VENTOLIN HFA) 108 (90 Base) MCG/ACT inhaler INHALE 2 PUFFS BY MOUTH EVERY 4 TO 6 HOURS AS NEEDED FOR COUGH OR WHEEZE *MAY USE 10-20 MIN PRIOR TO EXERCISE 18 g 3  . BREO ELLIPTA 100-25 MCG/INH AEPB INHALE 1 PUFF BY MOUTH DAILY AS NEEDED. 60 each 3  . famotidine (PEPCID) 20 MG tablet Take 20 mg by mouth 2 (two) times  daily.    Marland Kitchen FLUoxetine (PROZAC) 20 MG capsule Take 20 mg by mouth daily.    . fluticasone (VERAMYST) 27.5 MCG/SPRAY nasal spray Place 2 sprays into the nose daily.    . hydrochlorothiazide (MICROZIDE) 12.5 MG capsule TAKE 1 CAPSULE BY MOUTH ONCE DAILY 90 capsule 2  . Loperamide HCl (LOPERAMIDE A-D PO) Take by mouth. Patient takes 3xs a week as needed    . loratadine (CLARITIN) 10 MG tablet Take 10 mg by mouth daily as needed for allergies.    . mometasone (ELOCON) 0.1 % cream mometasone 0.1 % topical cream  APPLY A THIN LAYER TO THE AFFECTED AREA OF THE SKIN DAILY    . pantoprazole (PROTONIX) 40 MG tablet Take 40 mg by mouth daily.    . Probiotic Product (ALIGN PO) Take by mouth daily.    . tizanidine (ZANAFLEX) 2 MG capsule Take 1 capsule (2 mg total) by mouth 3 (three) times daily as needed for muscle spasms. 270 capsule 0  . topiramate (TOPAMAX) 50 MG tablet Take 1 tablet (50 mg total) by mouth 2 (two) times daily. 60 tablet 12  . valACYclovir (VALTREX) 500 MG tablet TAKE 1 TABLET BY MOUTH DAILY. 90 tablet 3  . EPINEPHrine 0.3 mg/0.3 mL IJ SOAJ injection Inject 0.3 mg into the muscle as needed. 1 each 2   No current facility-administered medications for this visit.   Allergies: Allergies  Allergen Reactions  . Penicillins     RASH, amoxicillin  . Lipitor [Atorvastatin]     Cognitive impact   Social History: Social History   Socioeconomic History  . Marital status: Single    Spouse name: Not on file  . Number of  children: Not on file  . Years of education: Not on file  . Highest education level: Bachelor's degree (e.g., BA, AB, BS)  Occupational History    Comment: [employer]  Tobacco Use  . Smoking status: Former Smoker    Quit date: 03/14/1984    Years since quitting: 35.9  . Smokeless tobacco: Never Used  . Tobacco comment: smoked 1975-1986, up to 1.5 ppd  Vaping Use  . Vaping Use: Never used  Substance and Sexual Activity  . Alcohol use: No  . Drug use: No  . Sexual activity: Not on file  Other Topics Concern  . Not on file  Social History Narrative   Exercise: 2-4 times a week   Social Determinants of Health   Financial Resource Strain:   . Difficulty of Paying Living Expenses: Not on file  Food Insecurity:   . Worried About Charity fundraiser in the Last Year: Not on file  . Ran Out of Food in the Last Year: Not on file  Transportation Needs:   . Lack of Transportation (Medical): Not on file  . Lack of Transportation (Non-Medical): Not on file  Physical Activity:   . Days of Exercise per Week: Not on file  . Minutes of Exercise per Session: Not on file  Stress:   . Feeling of Stress : Not on file  Social Connections:   . Frequency of Communication with Friends and Family: Not on file  . Frequency of Social Gatherings with Friends and Family: Not on file  . Attends Religious Services: Not on file  . Active Member of Clubs or Organizations: Not on file  . Attends Archivist Meetings: Not on file  . Marital Status: Not on file   Lives in a house which is 64 years  old. Smoking: quit, smoked for 11 years Occupation: Therapist, sports History: Water Damage/mildew in the house: yes Carpet in the family room: no Carpet in the bedroom: no Heating: gas Cooling: central Pet: yes 2 dogs x 2.5 yrs  Family History: Family History  Adopted: Yes  Problem Relation Age of Onset  . Allergic rhinitis Mother   . Asthma Neg Hx   . Eczema Neg Hx   . Urticaria Neg Hx     Review of Systems  Constitutional: Negative for appetite change, chills, fever and unexpected weight change.  HENT: Negative for congestion and rhinorrhea.   Eyes: Negative for itching.  Respiratory: Negative for cough, chest tightness, shortness of breath and wheezing.   Cardiovascular: Negative for chest pain.  Gastrointestinal: Positive for abdominal pain.  Genitourinary: Negative for difficulty urinating.  Skin: Negative for rash.  Neurological: Positive for headaches.   Objective: BP 118/76 (BP Location: Left Arm, Patient Position: Sitting, Cuff Size: Normal)   Pulse 62   Temp 98.3 F (36.8 C) (Temporal)   Resp 16   Ht 5' 4.21" (1.631 m)   Wt 198 lb 4 oz (89.9 kg)   SpO2 96%   BMI 33.80 kg/m  Body mass index is 33.8 kg/m. Physical Exam Vitals and nursing note reviewed.  Constitutional:      Appearance: Normal appearance. She is well-developed.  HENT:     Head: Normocephalic and atraumatic.     Right Ear: External ear normal.     Left Ear: External ear normal.     Nose: Nose normal.     Mouth/Throat:     Mouth: Mucous membranes are moist.     Pharynx: Oropharynx is clear.  Eyes:     Conjunctiva/sclera: Conjunctivae normal.  Cardiovascular:     Rate and Rhythm: Normal rate and regular rhythm.     Heart sounds: Normal heart sounds. No murmur heard.  No friction rub. No gallop.   Pulmonary:     Effort: Pulmonary effort is normal.     Breath sounds: Normal breath sounds. No wheezing, rhonchi or rales.  Abdominal:     Palpations: Abdomen is soft.  Musculoskeletal:     Cervical back: Neck supple.  Skin:    General: Skin is warm.     Findings: No rash.  Neurological:     Mental Status: She is alert and oriented to person, place, and time.  Psychiatric:        Behavior: Behavior normal.    The plan was reviewed with the patient/family, and all questions/concerned were addressed.  It was my pleasure to see Talitha today and participate in her care. Please feel  free to contact me with any questions or concerns.  Sincerely,  Rexene Alberts, DO Allergy & Immunology  [facility] office: [place] office: [phone]

## 2020-01-30 NOTE — Assessment & Plan Note (Signed)
Skin testing in the past with multiple positives per patient report.  Takes over-the-counter antihistamines daily with good benefit.  May use over the counter antihistamines such as Zyrtec (cetirizine), Claritin (loratadine), Allegra (fexofenadine), or Xyzal (levocetirizine) daily as needed. May take twice a day during flares.  May use Flonase (fluticasone) nasal spray 1 spray per nostril twice a day as needed for nasal congestion.   Nasal saline spray (i.e., Simply Saline) or nasal saline lavage (i.e., NeilMed) is recommended as needed and prior to medicated nasal sprays.  If symptoms worsen, recommend re-testing in future.

## 2020-02-07 LAB — IGE NUT PROF. W/COMPONENT RFLX
F017-IgE Hazelnut (Filbert): <0.1 kU/L
F018-IgE Brazil Nut: <0.1 kU/L
F020-IgE Almond: <0.1 kU/L
F202-IgE Cashew Nut: <0.1 kU/L
F203-IgE Pistachio Nut: <0.1 kU/L
F256-IgE Walnut: <0.1 kU/L
Macadamia Nut, IgE: <0.1 kU/L
Peanut, IgE: 0.11 kU/L — AB
Pecan Nut IgE: <0.1 kU/L

## 2020-02-07 LAB — PROTEIN ELECTROPHORESIS, SERUM
A/G Ratio: 1.3 (ref 0.7–1.7)
Albumin ELP: 3.7 g/dL (ref 2.9–4.4)
Alpha 1: 0.1 g/dL (ref 0.0–0.4)
Alpha 2: 0.8 g/dL (ref 0.4–1.0)
Beta: 1.1 g/dL (ref 0.7–1.3)
Gamma Globulin: 0.7 g/dL (ref 0.4–1.8)
Globulin, Total: 2.8 g/dL (ref 2.2–3.9)
Total Protein: 6.5 g/dL (ref 6.0–8.5)

## 2020-02-07 LAB — ALPHA-GAL PANEL
Alpha Gal IgE*: <0.1 kU/L (ref <0.10)
Beef (Bos spp) IgE: <0.1 kU/L (ref <0.35)
Class Interpretation: 0
Class Interpretation: 0
Class Interpretation: 0
Lamb/Mutton (Ovis spp) IgE: <0.1 kU/L (ref <0.35)
Pork (Sus spp) IgE: <0.1 kU/L (ref <0.35)

## 2020-02-07 LAB — COMPLEMENT COMPONENT C1Q: Complement C1Q: 15.4 mg/dL (ref 10.3–20.5)

## 2020-02-07 LAB — PEANUT COMPONENTS
F352-IgE Ara h 8: <0.1 kU/L
F422-IgE Ara h 1: <0.1 kU/L
F423-IgE Ara h 2: <0.1 kU/L
F424-IgE Ara h 3: <0.1 kU/L
F427-IgE Ara h 9: <0.1 kU/L
F447-IgE Ara h 6: <0.1 kU/L

## 2020-02-07 LAB — ALLERGEN COMPONENT COMMENTS

## 2020-02-07 LAB — C1 ESTERASE INHIBITOR: C1INH SerPl-mCnc: 36 mg/dL (ref 21–39)

## 2020-02-07 LAB — PROTEIN ELECTROPHORESIS, URINE REFLEX
Albumin ELP, Urine: 100 %
Alpha-1-Globulin, U: 0 %
Alpha-2-Globulin, U: 0 %
Beta Globulin, U: 0 %
Gamma Globulin, U: 0 %
Protein, Ur: 10 mg/dL

## 2020-02-07 LAB — TRYPTASE: Tryptase: 5.5 ug/L (ref 2.2–13.2)

## 2020-02-07 LAB — C-REACTIVE PROTEIN: CRP: 8 mg/L (ref 0–10)

## 2020-02-07 LAB — C1 ESTERASE INHIBITOR, FUNCTIONAL: C1INH Functional/C1INH Total MFr SerPl: >91 %mean normal

## 2020-02-07 LAB — C3 AND C4
Complement C3, Serum: 151 mg/dL (ref 82–167)
Complement C4, Serum: 31 mg/dL (ref 12–38)

## 2020-02-07 LAB — SEDIMENTATION RATE: Sed Rate: 16 mm/hr (ref 0–40)

## 2020-02-10 ENCOUNTER — Telehealth: Payer: Self-pay

## 2020-02-10 NOTE — Telephone Encounter (Signed)
Patient called due to another episode of tongue swelling. She did take Benadryl as well as used her epinephrine injector. EMS did come out to evaluate her and gave her the option to go to the emergency room for evaluation or just stay home and monitor her symptoms. Patient is still at home and is stable. No worsening symptoms and she is talking in full sentences. I did speak with Dr. Maudie Mercury and per our verbal conversation patient was instructed to continue antihistamine regimen and to call back if needed. She is scheduled for 02/11/20 for a televisit.

## 2020-02-11 ENCOUNTER — Ambulatory Visit (INDEPENDENT_AMBULATORY_CARE_PROVIDER_SITE_OTHER): Payer: 59 | Admitting: Allergy

## 2020-02-11 ENCOUNTER — Encounter: Payer: Self-pay | Admitting: Allergy

## 2020-02-11 ENCOUNTER — Other Ambulatory Visit: Payer: Self-pay

## 2020-02-11 DIAGNOSIS — J3089 Other allergic rhinitis: Secondary | ICD-10-CM | POA: Diagnosis not present

## 2020-02-11 DIAGNOSIS — T781XXD Other adverse food reactions, not elsewhere classified, subsequent encounter: Secondary | ICD-10-CM

## 2020-02-11 DIAGNOSIS — J4540 Moderate persistent asthma, uncomplicated: Secondary | ICD-10-CM | POA: Diagnosis not present

## 2020-02-11 DIAGNOSIS — T783XXD Angioneurotic edema, subsequent encounter: Secondary | ICD-10-CM | POA: Diagnosis not present

## 2020-02-11 DIAGNOSIS — K219 Gastro-esophageal reflux disease without esophagitis: Secondary | ICD-10-CM

## 2020-02-11 DIAGNOSIS — T7819XD Other adverse food reactions, not elsewhere classified, subsequent encounter: Secondary | ICD-10-CM

## 2020-02-11 NOTE — Assessment & Plan Note (Addendum)
Past history - Used to follow with pulmonology and currently on Breo 100 mcg 1 puff daily and using albuterol mainly during upper respiratory infections with good benefit. . Daily controller medication(s): continue Breo 170mcg 1 puff once a day and rinse mouth after each use.  . May use albuterol rescue inhaler 2 puffs every 4 to 6 hours as needed for shortness of breath, chest tightness, coughing, and wheezing. May use albuterol rescue inhaler 2 puffs 5 to 15 minutes prior to strenuous physical activities. Monitor frequency of use.  . Get spirometry at next visit.

## 2020-02-11 NOTE — Assessment & Plan Note (Addendum)
Past history - 2 episodes of tongue angioedema. Treated with Solu-Medrol and Benadryl with good benefit.  Denies any other associated symptoms.  First episode occurred 30 minutes after eating a peanut butter and bread sandwich.  Second episode occurred in the middle of night and had barbecue sandwich for dinner.  No recent tick bites. Not on ace-inhibitors. Up to date with cancer screening tests.  Previously avoiding tree nuts due to positive testing. Since then had wheat with no issues. Avoiding peanuts and tree nuts now. 2021 skin testing showed: Negative to select foods including, peanuts, tree nuts, beef, pork, wheat. Interim history - tongue angioedema yesterday morning after eating bread, vitamin c supplement and chai tea. She had all these before with no issues. Traveling the day before on plane from [facility]. Having some URI symptoms as well now. Covid-19 testing pending. Angioedema bloodwork negative. Used Epipen but symptoms took awhile to resolve.  Given above results not sure what caused angioedema - interestingly the Epipen did not reverse the symptoms quickly which questions if these episodes are histamine mediated or not.   Recommend that you start to avoid wheat as all 3 episodes occurred after eating something with wheat.  Also avoid Chai tea and vitamin C supplements.  Please send me the ingredient lists for the chai tea, vitamin C supplement and bread.  Make sure to take an antihistamine such as Claritin daily.   We may need to do some additional testing in the future.  For mild symptoms you can take over the counter antihistamines such as Benadryl and monitor symptoms closely. If symptoms worsen or if you have severe symptoms including breathing issues, throat closure, significant swelling, whole body hives, severe diarrhea and vomiting, lightheadedness then inject epinephrine and seek immediate medical care afterwards. Keep track of episodes - write down what you had eaten/come across  with the past 4-6 hours.  May reintroduce peanuts/tree nuts and red meat (pork, beef lamb) back into your diet one by one and monitor symptoms.

## 2020-02-11 NOTE — Assessment & Plan Note (Signed)
   See below for lifestyle modifications for reflux.  Continue pantoprazole 40mg  daily.

## 2020-02-11 NOTE — Assessment & Plan Note (Signed)
Past history - Skin testing in the past with multiple positives per patient report.   May use over the counter antihistamines such as Zyrtec (cetirizine), Claritin (loratadine), Allegra (fexofenadine), or Xyzal (levocetirizine) daily as needed. May take twice a day during flares.  May use Flonase (fluticasone) nasal spray 1 spray per nostril twice a day as needed for nasal congestion.   Nasal saline spray (i.e., Simply Saline) or nasal saline lavage (i.e., NeilMed) is recommended as needed and prior to medicated nasal sprays.  If symptoms worsen, recommend re-testing in future.

## 2020-02-11 NOTE — Progress Notes (Signed)
RE: Kassady Laboy MRN: [id number] DOB: 02-Nov-1955 Date of Telemedicine Visit: 02/11/2020  Referring provider: Binnie Rail, MD Primary care provider: Binnie Rail, MD  Chief Complaint: Allergic Reaction (Televisit at home. Patient gave consent to treat and bill insurance for this visit.)   Telemedicine Follow Up Visit via Telephone: I connected with Arlenne Kimbley for a follow up on 02/11/20 by telephone and verified that I am speaking with the correct person using two identifiers.   I discussed the limitations, risks, security and privacy concerns of performing an evaluation and management service by telephone and the availability of in person appointments. I also discussed with the patient that there may be a patient responsible charge related to this service. The patient expressed understanding and agreed to proceed.  Patient is at home/work. Provider is at the office.  Visit start time: 3:09PM Visit end time: 3:30PM Insurance consent/check in by: front desk Medical consent and medical assistant/nurse: Aileen Pilot.  History of Present Illness: She is a 64 y.o. female, who is being followed for angioedema, allergic rhinitis, reactive airway disease and GERD. Her previous allergy office visit was on 01/20/2020 with Dr. Maudie Mercury. Today is a new complaint visit of angioedema episode.  Patient woke up yesterday morning with laryngitis/cold symptoms. Tongue was normal size but felt a little unusual.   She had 2 chewable vitamin C's which she normally does not take. She had this before without any issues though.  Then she had chai tea with milk and small piece of bread.  Then she noticed her tongue swelling on the right side.  Denies any itching, rash or trouble breathing. . She took benadryl 50mg  and then used Epipen with no immediate benefit.  She called 911 and was evaluated. She had elevated blood pressure but otherwise normal exam. Given the options patient decided to stay  at home instead of going to the ER.   Now her tongue is back to normal size.   The night before patient did not have anything new to eat. She was on an airplane coming back from [place] and she was feeling a little sick the night before.  She may have taken a tylenol before going to bed but not sure and may have had some crackers.   Patient did not try red meat, peanuts and tree nuts back into diet.  Patient usually takes Claritin in the mornings. She did get covid-19 testing done which is pending.    Other allergic rhinitis Stable.   Reactive airway disease Stable.   Gastroesophageal reflux disease Taking pantoprazole 40mg  daily.  Assessment and Plan: Mialynn is a 64 y.o. female with: Angio-edema Past history - 2 episodes of tongue angioedema. Treated with Solu-Medrol and Benadryl with good benefit.  Denies any other associated symptoms.  First episode occurred 30 minutes after eating a peanut butter and bread sandwich.  Second episode occurred in the middle of night and had barbecue sandwich for dinner.  No recent tick bites. Not on ace-inhibitors. Up to date with cancer screening tests.  Previously avoiding tree nuts due to positive testing. Since then had wheat with no issues. Avoiding peanuts and tree nuts now. 2021 skin testing showed: Negative to select foods including, peanuts, tree nuts, beef, pork, wheat. Interim history - tongue angioedema yesterday morning after eating bread, vitamin c supplement and chai tea. She had all these before with no issues. Traveling the day before on plane from [facility]. Having some URI symptoms as well now. Covid-19 testing  pending. Angioedema bloodwork negative. Used Epipen but symptoms took awhile to resolve.  Given above results not sure what caused angioedema - interestingly the Epipen did not reverse the symptoms quickly which questions if these episodes are histamine mediated or not.   Recommend that you start to avoid wheat as all 3 episodes  occurred after eating something with wheat.  Also avoid Chai tea and vitamin C supplements.  Please send me the ingredient lists for the chai tea, vitamin C supplement and bread.  Make sure to take an antihistamine such as Claritin daily.   We may need to do some additional testing in the future.  For mild symptoms you can take over the counter antihistamines such as Benadryl and monitor symptoms closely. If symptoms worsen or if you have severe symptoms including breathing issues, throat closure, significant swelling, whole body hives, severe diarrhea and vomiting, lightheadedness then inject epinephrine and seek immediate medical care afterwards. Keep track of episodes - write down what you had eaten/come across with the past 4-6 hours.  May reintroduce peanuts/tree nuts and red meat (pork, beef lamb) back into your diet one by one and monitor symptoms.   Other allergic rhinitis Past history - Skin testing in the past with multiple positives per patient report.   May use over the counter antihistamines such as Zyrtec (cetirizine), Claritin (loratadine), Allegra (fexofenadine), or Xyzal (levocetirizine) daily as needed. May take twice a day during flares.  May use Flonase (fluticasone) nasal spray 1 spray per nostril twice a day as needed for nasal congestion.   Nasal saline spray (i.e., Simply Saline) or nasal saline lavage (i.e., NeilMed) is recommended as needed and prior to medicated nasal sprays.  If symptoms worsen, recommend re-testing in future.   Reactive airway disease Past history - Used to follow with pulmonology and currently on Breo 100 mcg 1 puff daily and using albuterol mainly during upper respiratory infections with good benefit. . Daily controller medication(s): continue Breo 144mcg 1 puff once a day and rinse mouth after each use.  . May use albuterol rescue inhaler 2 puffs every 4 to 6 hours as needed for shortness of breath, chest tightness, coughing, and wheezing.  May use albuterol rescue inhaler 2 puffs 5 to 15 minutes prior to strenuous physical activities. Monitor frequency of use.  . Get spirometry at next visit.  Gastroesophageal reflux disease  See below for lifestyle modifications for reflux.  Continue pantoprazole 40mg  daily.  Return in about 2 months (around 04/12/2020).  Diagnostics: None.  Medication List:  Current Outpatient Medications  Medication Sig Dispense Refill  . acetaminophen (TYLENOL) 500 MG tablet Take 500 mg by mouth every 6 (six) hours as needed.    Marland Kitchen albuterol (VENTOLIN HFA) 108 (90 Base) MCG/ACT inhaler INHALE 2 PUFFS BY MOUTH EVERY 4 TO 6 HOURS AS NEEDED FOR COUGH OR WHEEZE *MAY USE 10-20 MIN PRIOR TO EXERCISE 18 g 3  . BREO ELLIPTA 100-25 MCG/INH AEPB INHALE 1 PUFF BY MOUTH DAILY AS NEEDED. 60 each 3  . EPINEPHrine 0.3 mg/0.3 mL IJ SOAJ injection Inject 0.3 mg into the muscle as needed. 1 each 2  . FLUoxetine (PROZAC) 20 MG capsule Take 20 mg by mouth daily.    . fluticasone (VERAMYST) 27.5 MCG/SPRAY nasal spray Place 2 sprays into the nose daily.    . hydrochlorothiazide (MICROZIDE) 12.5 MG capsule TAKE 1 CAPSULE BY MOUTH ONCE DAILY 90 capsule 2  . Loperamide HCl (LOPERAMIDE A-D PO) Take by mouth. Patient takes 3xs a week  as needed    . loratadine (CLARITIN) 10 MG tablet Take 10 mg by mouth daily as needed for allergies.    . mometasone (ELOCON) 0.1 % cream mometasone 0.1 % topical cream  APPLY A THIN LAYER TO THE AFFECTED AREA OF THE SKIN DAILY    . pantoprazole (PROTONIX) 40 MG tablet Take 40 mg by mouth daily.    . Probiotic Product (ALIGN PO) Take by mouth daily.    . tizanidine (ZANAFLEX) 2 MG capsule Take 1 capsule (2 mg total) by mouth 3 (three) times daily as needed for muscle spasms. 270 capsule 0  . valACYclovir (VALTREX) 500 MG tablet TAKE 1 TABLET BY MOUTH DAILY. 90 tablet 3  . topiramate (TOPAMAX) 50 MG tablet Take 1 tablet (50 mg total) by mouth 2 (two) times daily. (Patient not taking: Reported on  02/11/2020) 60 tablet 12   No current facility-administered medications for this visit.   Allergies: Allergies  Allergen Reactions  . Penicillins     RASH, amoxicillin  . Lipitor [Atorvastatin]     Cognitive impact   I reviewed her past medical history, social history, family history, and environmental history and no significant changes have been reported from her previous visit.  Review of Systems  Constitutional: Negative for appetite change, chills, fever and unexpected weight change.  HENT: Positive for congestion and voice change.   Eyes: Negative for itching.  Respiratory: Negative for cough, chest tightness, shortness of breath and wheezing.   Cardiovascular: Negative for chest pain.  Genitourinary: Negative for difficulty urinating.  Skin: Negative for rash.   Objective: Physical Exam Not obtained as encounter was done via telephone.   Previous notes and tests were reviewed.  I discussed the assessment and treatment plan with the patient. The patient was provided an opportunity to ask questions and all were answered. The patient agreed with the plan and demonstrated an understanding of the instructions. After visit summary/patient instructions available via mychart.   The patient was advised to call back or seek an in-person evaluation if the symptoms worsen or if the condition fails to improve as anticipated.  I provided 21 minutes of non-face-to-face time during this encounter.  It was my pleasure to participate in Reizel Calzada care today. Please feel free to contact me with any questions or concerns.   Sincerely,  Rexene Alberts, DO Allergy & Immunology  [facility] office: [place] office: [phone]

## 2020-02-11 NOTE — Patient Instructions (Addendum)
Swelling:  Not sure what triggered your most recent episode  Recommend that you start to avoid wheat as all 3 of your episodes occurred after eating something with wheat.  Also avoid Chai tea and vitamin C supplements.  Please send me the ingredient lists for the chai tea, vitamin C supplement and bread.  Make sure you take an antihistamine such as Claritin daily.   We may need to do some additional testing in the future.  For mild symptoms you can take over the counter antihistamines such as Benadryl and monitor symptoms closely. If symptoms worsen or if you have severe symptoms including breathing issues, throat closure, significant swelling, whole body hives, severe diarrhea and vomiting, lightheadedness then inject epinephrine and seek immediate medical care afterwards.  Keep track of episodes - write down what you had eaten/come across with the past 4-6 hours.  You may reintroduce peanuts/tree nuts and red meat (pork, beef lamb) back into your diet one by one and monitor symptoms.   Quarantine until covid-19 testing results are back.  Environmental allergies  May use over the counter antihistamines such as Zyrtec (cetirizine), Claritin (loratadine), Allegra (fexofenadine), or Xyzal (levocetirizine) daily as needed. May take twice a day during flares.  May use Flonase (fluticasone) nasal spray 1 spray per nostril twice a day as needed for nasal congestion.   Nasal saline spray (i.e., Simply Saline) or nasal saline lavage (i.e., NeilMed) is recommended as needed and prior to medicated nasal sprays.  If symptoms worsen, recommend re-testing in future.   Breathing: . Daily controller medication(s): continue Breo 160mcg 1 puff once a day and rinse mouth after each use.  . May use albuterol rescue inhaler 2 puffs every 4 to 6 hours as needed for shortness of breath, chest tightness, coughing, and wheezing. May use albuterol rescue inhaler 2 puffs 5 to 15 minutes prior to strenuous  physical activities. Monitor frequency of use.  . Breathing control goals:  o Full participation in all desired activities (may need albuterol before activity) o Albuterol use two times or less a week on average (not counting use with activity) o Cough interfering with sleep two times or less a month o Oral steroids no more than once a year o No hospitalizations  Reflux:  See below for lifestyle modifications for reflux.  Continue pantoprazole 40mg  daily.  Follow up in 2 months or sooner if needed - make sure to schedule with me.  Sincerely,  Rexene Alberts, DO Allergy & Immunology  [facility] office: [place] office: [phone]   Gastroesophageal Reflux Disease, Adult Gastroesophageal reflux (GER) happens when acid from the stomach flows up into the tube that connects the mouth and the stomach (esophagus). Normally, food travels down the esophagus and stays in the stomach to be digested. With GER, food and stomach acid sometimes move back up into the esophagus. You may have a disease called gastroesophageal reflux disease (GERD) if the reflux:  Happens often.  Causes frequent or very bad symptoms.  Causes problems such as damage to the esophagus. When this happens, the esophagus becomes sore and swollen (inflamed). Over time, GERD can make small holes (ulcers) in the lining of the esophagus. What are the causes? This condition is caused by a problem with the muscle between the esophagus and the stomach. When this muscle is weak or not normal, it does not close properly to keep food and acid from coming back up from the stomach. The muscle can  be weak because of:  Tobacco use.  Pregnancy.  Having a certain type of hernia (hiatal hernia).  Alcohol use.  Certain foods and drinks, such as coffee, chocolate, onions, and peppermint. What increases the risk? You are more likely to develop this condition if you:  Are  overweight.  Have a disease that affects your connective tissue.  Use NSAID medicines. What are the signs or symptoms? Symptoms of this condition include:  Heartburn.  Difficult or painful swallowing.  The feeling of having a lump in the throat.  A bitter taste in the mouth.  Bad breath.  Having a lot of saliva.  Having an upset or bloated stomach.  Belching.  Chest pain. Different conditions can cause chest pain. Make sure you see your doctor if you have chest pain.  Shortness of breath or noisy breathing (wheezing).  Ongoing (chronic) cough or a cough at night.  Wearing away of the surface of teeth (tooth enamel).  Weight loss. How is this treated? Treatment will depend on how bad your symptoms are. Your doctor may suggest:  Changes to your diet.  Medicine.  Surgery. Follow these instructions at home: Eating and drinking   Follow a diet as told by your doctor. You may need to avoid foods and drinks such as: ? Coffee and tea (with or without caffeine). ? Drinks that contain alcohol. ? Energy drinks and sports drinks. ? Bubbly (carbonated) drinks or sodas. ? Chocolate and cocoa. ? Peppermint and mint flavorings. ? Garlic and onions. ? Horseradish. ? Spicy and acidic foods. These include peppers, chili powder, curry powder, vinegar, hot sauces, and BBQ sauce. ? Citrus fruit juices and citrus fruits, such as oranges, lemons, and limes. ? Tomato-based foods. These include red sauce, chili, salsa, and pizza with red sauce. ? Fried and fatty foods. These include donuts, french fries, potato chips, and high-fat dressings. ? High-fat meats. These include hot dogs, rib eye steak, sausage, ham, and bacon. ? High-fat dairy items, such as whole milk, butter, and cream cheese.  Eat small meals often. Avoid eating large meals.  Avoid drinking large amounts of liquid with your meals.  Avoid eating meals during the 2-3 hours before bedtime.  Avoid lying down right  after you eat.  Do not exercise right after you eat. Lifestyle   Do not use any products that contain nicotine or tobacco. These include cigarettes, e-cigarettes, and chewing tobacco. If you need help quitting, ask your doctor.  Try to lower your stress. If you need help doing this, ask your doctor.  If you are overweight, lose an amount of weight that is healthy for you. Ask your doctor about a safe weight loss goal. General instructions  Pay attention to any changes in your symptoms.  Take over-the-counter and prescription medicines only as told by your doctor. Do not take aspirin, ibuprofen, or other NSAIDs unless your doctor says it is okay.  Wear loose clothes. Do not wear anything tight around your waist.  Raise (elevate) the head of your bed about 6 inches (15 cm).  Avoid bending over if this makes your symptoms worse.  Keep all follow-up visits as told by your doctor. This is important. Contact a doctor if:  You have new symptoms.  You lose weight and you do not know why.  You have trouble swallowing or it hurts to swallow.  You have wheezing or a cough that keeps happening.  Your symptoms do not get better with treatment.  You have a hoarse voice.  Get help right away if:  You have pain in your arms, neck, jaw, teeth, or back.  You feel sweaty, dizzy, or light-headed.  You have chest pain or shortness of breath.  You throw up (vomit) and your throw-up looks like blood or coffee grounds.  You pass out (faint).  Your poop (stool) is bloody or black.  You cannot swallow, drink, or eat. Summary  If a person has gastroesophageal reflux disease (GERD), food and stomach acid move back up into the esophagus and cause symptoms or problems such as damage to the esophagus.  Treatment will depend on how bad your symptoms are.  Follow a diet as told by your doctor.  Take all medicines only as told by your doctor. This information is not intended to replace  advice given to you by your health care provider. Make sure you discuss any questions you have with your health care provider. Document Revised: 09/06/2017 Document Reviewed: 09/06/2017 Elsevier Patient Education  [place].

## 2020-02-12 ENCOUNTER — Other Ambulatory Visit: Payer: Self-pay | Admitting: Internal Medicine

## 2020-02-12 ENCOUNTER — Telehealth (INDEPENDENT_AMBULATORY_CARE_PROVIDER_SITE_OTHER): Payer: 59 | Admitting: Internal Medicine

## 2020-02-12 ENCOUNTER — Encounter: Payer: Self-pay | Admitting: Internal Medicine

## 2020-02-12 DIAGNOSIS — J45909 Unspecified asthma, uncomplicated: Secondary | ICD-10-CM | POA: Insufficient documentation

## 2020-02-12 DIAGNOSIS — J209 Acute bronchitis, unspecified: Secondary | ICD-10-CM

## 2020-02-12 DIAGNOSIS — J45901 Unspecified asthma with (acute) exacerbation: Secondary | ICD-10-CM

## 2020-02-12 HISTORY — DX: Unspecified asthma with (acute) exacerbation: J45.901

## 2020-02-12 MED ORDER — HYDROCODONE-HOMATROPINE 5-1.5 MG/5ML PO SYRP
5.0000 mL | ORAL_SOLUTION | Freq: Three times a day (TID) | ORAL | 0 refills | Status: DC | PRN
Start: 2020-02-12 — End: 2020-05-05

## 2020-02-12 MED ORDER — AZITHROMYCIN 250 MG PO TABS: ORAL_TABLET | ORAL | 0 refills | Status: DC

## 2020-02-12 MED FILL — AZITHROMYCIN 250 MG TABLET: 250 | 5 days supply | Qty: 6 | Fill #0

## 2020-02-12 MED FILL — HYDROCODONE-HOMATROPINE SYR: 5-1.5 | 8 days supply | Qty: 120 | Fill #0

## 2020-02-12 NOTE — Progress Notes (Signed)
Virtual Visit via Video Note  I connected with Dana Mccoy on 02/12/20 at 11:15 AM EST by a video enabled telemedicine application and verified that I am speaking with the correct person using two identifiers.   I discussed the limitations of evaluation and management by telemedicine and the availability of in person appointments. The patient expressed understanding and agreed to proceed.  Present for the visit:  Myself, Dr Billey Gosling, [name].  The patient is currently at home and I am in the office.    No referring provider.    History of Present Illness: She is here for an acute visit for cold symptoms.   Her symptoms started after Thanksgiving.  She did travel up to [place] where the holiday.  A couple of her grandsons did have cold symptoms.  She is experiencing chills, low-grade fever up to 99.8, nasal congestion, sinus pain, sore throat, hoarseness, cough that is productive of clear-brown sputum, some mild shortness of breath with activity, mild wheezing, myalgias and headaches.  She did have a Covid test today-results are pending.  She has tried taking inhalers twice, tylenol, sudafed  Her inhalers have helped some.  She denies any chest tightness.   Review of Systems  Constitutional: Positive for chills. Negative for fever (99.8).  HENT: Positive for congestion, sinus pain and sore throat.   Respiratory: Positive for cough, sputum production (clear - green-brown), shortness of breath (w/ activity) and wheezing.   Gastrointestinal: Negative for diarrhea and nausea.  Musculoskeletal: Positive for myalgias.  Neurological: Positive for headaches.      Social History   Socioeconomic History  . Marital status: Single    Spouse name: Not on file  . Number of children: Not on file  . Years of education: Not on file  . Highest education level: Bachelor's degree (e.g., BA, AB, BS)  Occupational History    Comment: [employer]  Tobacco Use  . Smoking  status: Former Smoker    Quit date: 03/14/1984    Years since quitting: 35.9  . Smokeless tobacco: Never Used  . Tobacco comment: smoked 1975-1986, up to 1.5 ppd  Vaping Use  . Vaping Use: Never used  Substance and Sexual Activity  . Alcohol use: No  . Drug use: No  . Sexual activity: Not on file  Other Topics Concern  . Not on file  Social History Narrative   Exercise: 2-4 times a week   Social Determinants of Health   Financial Resource Strain:   . Difficulty of Paying Living Expenses: Not on file  Food Insecurity:   . Worried About Charity fundraiser in the Last Year: Not on file  . Ran Out of Food in the Last Year: Not on file  Transportation Needs:   . Lack of Transportation (Medical): Not on file  . Lack of Transportation (Non-Medical): Not on file  Physical Activity:   . Days of Exercise per Week: Not on file  . Minutes of Exercise per Session: Not on file  Stress:   . Feeling of Stress : Not on file  Social Connections:   . Frequency of Communication with Friends and Family: Not on file  . Frequency of Social Gatherings with Friends and Family: Not on file  . Attends Religious Services: Not on file  . Active Member of Clubs or Organizations: Not on file  . Attends Archivist Meetings: Not on file  . Marital Status: Not on file     Observations/Objective: Appears  well in NAD Hoarseness Breathing normally, intermittent bronchial-like cough sounds legs Skin appears warm and dry   Assessment and Plan:  See Problem List for Assessment and Plan of chronic medical problems.   Follow Up Instructions:    I discussed the assessment and treatment plan with the patient. The patient was provided an opportunity to ask questions and all were answered. The patient agreed with the plan and demonstrated an understanding of the instructions.   The patient was advised to call back or seek an in-person evaluation if the symptoms worsen or if the condition fails to  improve as anticipated.    Binnie Rail, MD

## 2020-02-12 NOTE — Assessment & Plan Note (Signed)
Acute History of asthma Has acute bronchitis with some reactive airway disease Continue Breo daily and albuterol as needed At this point no need for steroids-she will let me know if there is no improvement

## 2020-02-12 NOTE — Assessment & Plan Note (Signed)
Acute Likely bacterial  Start Z-Pak Hycodan cough syrup 5 mL every 8 hours as needed Continue inhalers and over-the-counter cold medications otc cold medications Rest, fluid Call if no improvement

## 2020-02-13 ENCOUNTER — Encounter: Payer: Self-pay | Admitting: Allergy

## 2020-02-15 MED FILL — HYDROCHLOROTHIAZIDE 12.5 MG: 12.5 | 90 days supply | Qty: 90 | Fill #1

## 2020-02-18 ENCOUNTER — Encounter: Payer: Self-pay | Admitting: Allergy

## 2020-02-18 ENCOUNTER — Encounter: Payer: Self-pay | Admitting: Internal Medicine

## 2020-02-19 NOTE — Telephone Encounter (Signed)
Forms have been completed & Placed in providers box to review and sign.  

## 2020-02-20 ENCOUNTER — Encounter: Payer: Self-pay | Admitting: Internal Medicine

## 2020-02-25 ENCOUNTER — Telehealth: Payer: Self-pay

## 2020-02-25 DIAGNOSIS — Z0279 Encounter for issue of other medical certificate: Secondary | ICD-10-CM

## 2020-02-25 NOTE — Telephone Encounter (Signed)
Filled out. Will fax and scan for records.

## 2020-02-25 NOTE — Telephone Encounter (Signed)
Forms have been signed, Faxed to [employer], Copy sent to scan &Charged for.   MyChart message sent to inform patient.

## 2020-02-25 NOTE — Telephone Encounter (Signed)
Patient is requesting a call back. [phone]

## 2020-02-25 NOTE — Telephone Encounter (Signed)
Patient sent a mychart message informing us that [employer] would be sending over FMLA forms for Dr. Maudie Mercury to fill out for patients missed work and upcoming appointments regarding her tongue swelling. Forms have been received and place of Dr. Julianne Rice desk for completion.

## 2020-02-25 NOTE — Telephone Encounter (Signed)
Forms have been faxed to [phone]. Patient has been made aware.

## 2020-02-28 NOTE — Telephone Encounter (Signed)
Forms have been updated and faxed. 

## 2020-03-11 MED FILL — VALACYCLOVIR HCL 500 MG TAB: 500 | 90 days supply | Qty: 90 | Fill #2

## 2020-03-11 MED FILL — BREO ELLIPTA 100-25 MCG INH: 100-25 | 30 days supply | Qty: 60 | Fill #1

## 2020-03-11 MED FILL — FLUoxetine HCL 20 MG CAPS: 20 | 90 days supply | Qty: 90 | Fill #0

## 2020-04-30 ENCOUNTER — Other Ambulatory Visit: Payer: Self-pay | Admitting: Obstetrics & Gynecology

## 2020-04-30 DIAGNOSIS — Z1231 Encounter for screening mammogram for malignant neoplasm of breast: Secondary | ICD-10-CM

## 2020-04-30 MED FILL — PANTOPRAZOLE SOD DR 40 MG T: 40 | 90 days supply | Qty: 180 | Fill #1

## 2020-04-30 MED FILL — HYDROCHLOROTHIAZIDE 12.5 MG: 12.5 | 90 days supply | Qty: 90 | Fill #2

## 2020-05-02 ENCOUNTER — Ambulatory Visit
Admission: RE | Admit: 2020-05-02 | Discharge: 2020-05-02 | Disposition: A | Discharge status: Home / Self Care | Payer: 59 | Source: Ambulatory Visit | Attending: Obstetrics & Gynecology | Admitting: Obstetrics & Gynecology

## 2020-05-02 ENCOUNTER — Other Ambulatory Visit: Payer: Self-pay

## 2020-05-02 DIAGNOSIS — Z1231 Encounter for screening mammogram for malignant neoplasm of breast: Secondary | ICD-10-CM

## 2020-05-02 LAB — HM MAMMOGRAPHY

## 2020-05-05 ENCOUNTER — Encounter: Payer: Self-pay | Admitting: Allergy & Immunology

## 2020-05-05 ENCOUNTER — Ambulatory Visit: Payer: 59 | Admitting: Allergy & Immunology

## 2020-05-05 ENCOUNTER — Other Ambulatory Visit: Payer: Self-pay

## 2020-05-05 VITALS — BP 156/98 | HR 65 | Temp 97.8°F | Resp 18

## 2020-05-05 DIAGNOSIS — T7840XD Allergy, unspecified, subsequent encounter: Secondary | ICD-10-CM

## 2020-05-05 DIAGNOSIS — K219 Gastro-esophageal reflux disease without esophagitis: Secondary | ICD-10-CM

## 2020-05-05 DIAGNOSIS — J3089 Other allergic rhinitis: Secondary | ICD-10-CM

## 2020-05-05 DIAGNOSIS — J4540 Moderate persistent asthma, uncomplicated: Secondary | ICD-10-CM

## 2020-05-05 NOTE — Patient Instructions (Addendum)
1. Allergic reaction - We are getting a serum tryptase which can be elevated during periods of anaphylaxis. - We will let you know when this comes back. - I would recommend increasing the Claritin to twice daily to see if this can help. - Pack of prednisone provided if it gets worse.  - Once we get the tryptase back, we may do further workup.   2. Return in about 6 months (around 11/02/2020).    Please inform us of any Emergency Department visits, hospitalizations, or changes in symptoms. Call us before going to the ED for breathing or allergy symptoms since we might be able to fit you in for a sick visit. Feel free to contact us anytime with any questions, problems, or concerns.  It was a pleasure to meet you today!  Websites that have reliable patient information: 1. American Academy of Asthma, Allergy, and Immunology: www.aaaai.org 2. Food Allergy Research and Education (FARE): foodallergy.org 3. Mothers of Asthmatics: http://www.asthmacommunitynetwork.org 4. American College of Allergy, Asthma, and Immunology: www.acaai.org   COVID-19 Vaccine Information can be found at: ShippingScam.co.uk For questions related to vaccine distribution or appointments, please email [email or web address] or call [phone].   We realize that you might be concerned about having an allergic reaction to the COVID19 vaccines. To help with that concern, WE ARE OFFERING THE COVID19 VACCINES IN OUR OFFICE! Ask the front desk for dates!     "Like" [name] on Facebook and Instagram for our latest updates!      A healthy democracy works best when [employer] participate! Make sure you are registered to vote! If you have moved or changed any of your contact information, you will need to get this updated before voting!  In some cases, you MAY be able to register to vote online: [email or web address]

## 2020-05-05 NOTE — Progress Notes (Signed)
FOLLOW UP  Date of Service/Encounter:  05/05/20   Assessment:   Angioedema  Allergic rhinitis  Moderate persistent asthma, uncomplicated  GERD   It is still a mystery why Dana Mccoy has these episodes of tongue swelling.  She has had a very thorough work-up by Dr. Maudie Mercury, but I did send a serum tryptase today to see if it was elevated at all.  If it is elevated, we might go down the c-kit pathway or consider genetic testing for hereditary alpha tryptasemia.  We are going to increase her antihistamine suppression with Claritin twice a day.  One option is also to see if we get Xolair approved, although with her episodes happening every 2 to 3 months that seems a little overkill.  Plan/Recommendations:   1. Allergic reaction - We are getting a serum tryptase which can be elevated during periods of anaphylaxis. - We will let you know when this comes back. - I would recommend increasing the Claritin to twice daily to see if this can help. - Pack of prednisone provided if it gets worse.  - Once we get the tryptase back, we may do further workup.   2. Return in about 6 months (around 11/02/2020).   Subjective:   Dana Mccoy is a 65 y.o. female presenting today for follow up of  Chief Complaint  Patient presents with  . Angioedema    Tongue     Dana Mccoy has a history of the following: Patient Active Problem List   Diagnosis Date Noted  . Asthma exacerbation, mild 02/12/2020  . Adverse food reaction 01/30/2020  . Angio-edema 01/17/2020  . Frequent headaches 01/17/2020  . DDD (degenerative disc disease), cervical 09/24/2019  . Numbness and tingling 09/06/2019  . Low iron 09/06/2019  . Tremor 09/06/2019  . Restless leg syndrome 07/27/2018  . Chronic pain of left knee 04/17/2018  . Right low back pain 04/17/2018  . Fatigue 08/21/2017  . Bradycardia 08/21/2017  . Atypical chest pain 08/21/2017  . Anxiety 12/08/2013  . Obesity (BMI 30-39.9) 08/02/2013   . Other allergic rhinitis 08/02/2013  . Acute bronchitis 04/02/2013  . Cervical stenosis of spine 04/26/2011  . Hyperlipidemia 06/05/2007  . Reactive airway disease 06/05/2007  . Gastroesophageal reflux disease 06/05/2007    History obtained from: chart review and patient.  Dana Mccoy is a 65 y.o. female presenting for a sick visit.  She was last seen in November 2021 for evaluation of an allergic reaction after having taken to vitamin C's as well as chai tea.  She had tongue swelling but no itching, rash, or trouble breathing.  She took Benadryl and use her EpiPen.  She then called 911 but did not go into the hospital since her vitals were largely normal.  Review of her testing shows that she had negative testing to peanuts, tree nuts, beef, pork, and wheat.  It was recommended that she take an antihistamine every day to prevent a lot of these reactions.   She has had lab work that is been negative for hereditary angioedema, tryptase, inflammatory markers, SPEP, and UPEP.  An alpha gal panel was negative as well.  She awoke this morning with tongue swelling. She woke up around 9am with tongue swelling. She felt that she was having problems swallowing. She has a mouth guard and felt that might be the reason. She removed it and still felt the sensation. She took a Benadryl liquid around 9:30. She does not feel that she is getting back towards normal.  She is not "lisping". She reports that her frenulum looks swollen and angry. She does think that her tongue itches, but the rest of her body does not itch.   Last night she ate meatloaf as well as carrots. She had some popcorn as well and a brownie bite. This was nothing out of the ordinary. She did a bunch of popcorn. This is episode #4. She gets them every 2-3 months. This is the general trend for her.   She has been eating wheat on occasion. She is on Claritin daily. She has tried other antihistamines in the past, but they cause sleepiness. The last  one was Zyrtec. She did feel some sleepiness from this and she has been trying to come off of it. She went through a period of time when her allergies were not driving her nuts. She did not notice that it helped prevent these episodes any more than the Claritin.   These episodes typically happen in the morning. They occur at different times in the mornings.   No one else in the family has this. There are kids and they are in good health without this problem.   She currently works in the [employer] with [employer]. Prior to 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   Asthma/Respiratory Symptom History: She remains on Breo 100 mcg 1 puff daily.  She follows with pulmonology.   Otherwise, there have been no changes to her past medical history, surgical history, family history, or social history.    Review of Systems  Constitutional: Negative.  Negative for chills, fever, malaise/fatigue and weight loss.  HENT: Negative.  Negative for congestion, ear discharge and ear pain.        Positive for tongue swelling.  Eyes: Negative for pain, discharge and redness.  Respiratory: Negative for cough, sputum production, shortness of breath and wheezing.   Cardiovascular: Negative.  Negative for chest pain and palpitations.  Gastrointestinal: Negative for abdominal pain, constipation, diarrhea, heartburn, nausea and vomiting.  Skin: Negative.  Negative for itching and rash.  Neurological: Negative for dizziness and headaches.  Endo/Heme/Allergies: Negative for environmental allergies. Does not bruise/bleed easily.       Objective:   Blood pressure (!) 156/98, pulse 65, temperature 97.8 F (36.6 C), temperature source Temporal, resp. rate 18, SpO2 97 %. There is no height or weight on file to calculate BMI.   Physical Exam:  Physical Exam Constitutional:      Appearance: She is well-developed.     Comments: Pleasant. Does not seem anxious.  HENT:     Head: Normocephalic and  atraumatic.     Right Ear: Tympanic membrane, ear canal and external ear normal.     Left Ear: Tympanic membrane, ear canal and external ear normal.     Nose: No nasal deformity, septal deviation, mucosal edema, rhinorrhea or epistaxis.     Right Turbinates: Enlarged and swollen. Not pale.     Left Turbinates: Enlarged and swollen. Not pale.     Right Sinus: No maxillary sinus tenderness or frontal sinus tenderness.     Left Sinus: No maxillary sinus tenderness or frontal sinus tenderness.     Comments: Tone appears normal today.    Mouth/Throat:     Mouth: Oropharynx is clear and moist. Mucous membranes are not pale and not dry.     Pharynx: Uvula midline.  Eyes:     General:        Right eye: No discharge.  Left eye: No discharge.     Extraocular Movements: EOM normal.     Conjunctiva/sclera: Conjunctivae normal.     Right eye: Right conjunctiva is not injected. No chemosis.    Left eye: Left conjunctiva is not injected. No chemosis.    Pupils: Pupils are equal, round, and reactive to light.  Cardiovascular:     Rate and Rhythm: Normal rate and regular rhythm.     Heart sounds: Normal heart sounds.  Pulmonary:     Effort: Pulmonary effort is normal. No tachypnea, accessory muscle usage or respiratory distress.     Breath sounds: Normal breath sounds. No wheezing, rhonchi or rales.     Comments: Moving air well in all lung fields.  No increased work of breathing. Chest:     Chest wall: No tenderness.  Lymphadenopathy:     Cervical: No cervical adenopathy.  Skin:    Coloration: Skin is not pale.     Findings: No abrasion, erythema, petechiae or rash. Rash is not papular, urticarial or vesicular.  Neurological:     Mental Status: She is alert.  Psychiatric:        Mood and Affect: Mood and affect normal.        Behavior: Behavior is cooperative.      Diagnostic studies: labs sent instead     Salvatore Marvel, MD  [facility]

## 2020-05-07 LAB — TRYPTASE: Tryptase: 7.3 ug/L (ref 2.2–13.2)

## 2020-05-08 ENCOUNTER — Ambulatory Visit: Payer: 59 | Admitting: Family

## 2020-05-11 ENCOUNTER — Ambulatory Visit: Payer: 59 | Admitting: Family

## 2020-05-20 ENCOUNTER — Ambulatory Visit: Payer: 59 | Admitting: Cardiology

## 2020-05-21 ENCOUNTER — Encounter: Payer: Self-pay | Admitting: Internal Medicine

## 2020-05-21 NOTE — Progress Notes (Signed)
Outside notes received. Information abstracted. Notes sent to scan.  

## 2020-05-27 ENCOUNTER — Ambulatory Visit: Payer: 59 | Admitting: Diagnostic Neuroimaging

## 2020-06-01 ENCOUNTER — Encounter: Payer: Self-pay | Admitting: Diagnostic Neuroimaging

## 2020-06-01 ENCOUNTER — Ambulatory Visit: Payer: 59 | Admitting: Diagnostic Neuroimaging

## 2020-06-01 VITALS — BP 126/82 | HR 74 | Ht 64.0 in | Wt 203.2 lb

## 2020-06-01 DIAGNOSIS — R5382 Chronic fatigue, unspecified: Secondary | ICD-10-CM | POA: Diagnosis not present

## 2020-06-01 DIAGNOSIS — R519 Headache, unspecified: Secondary | ICD-10-CM

## 2020-06-01 NOTE — Progress Notes (Signed)
[facility]  PATIENT: Dana Mccoy DOB: Aug 06, 1955  REFERRING CLINICIAN: Binnie Rail, MD HISTORY FROM: patient  REASON FOR VISIT: follow up    HISTORICAL  CHIEF COMPLAINT:  Chief Complaint  Patient presents with  . Chronic fatigue    Rm 7, 6 month FU "headaches are much better, take Tylenol as needed"   . Headache    HISTORY OF PRESENT ILLNESS:   UPDATE (06/01/20, VRP): Since last visit, doing better. HA and fatigue symptoms are slightly improved. Tylenol OTC helps. Now has new job since Jan 2022, which may be helping. Never tried the topiramate (was having other allergy issues, so didn't want to change other variables).   PRIOR HPI: 65 year old female here for evaluation of fatigue, headaches and numbness, tremor, word find difficulties.  Patient has had unexplained chronic fatigue for the past 1 year.  Symptoms mainly affect her when she is at home not doing that much activity.  She is able to do her work and drive without difficulty.  Patient also had intermittent headaches, with frontal, bandlike sensation, throbbing pain, phonophobia.  Headaches can last all day at a time.  No nausea, vision changes or dizziness.  In addition she has lower level daily nagging headaches.  She uses Tylenol 20 days a month.    Patient also has some intermittent numbness in her face.  Intermittent tremors.  Intermittent word find difficulty.  Having more stress.   REVIEW OF SYSTEMS: Full 14 system review of systems performed and negative with exception of: As per HPI.  ALLERGIES: Allergies  Allergen Reactions  . Penicillins     RASH, amoxicillin  . Lipitor [Atorvastatin]     Cognitive impact    HOME MEDICATIONS: Outpatient Medications Prior to Visit  Medication Sig Dispense Refill  . albuterol (VENTOLIN HFA) 108 (90 Base) MCG/ACT inhaler INHALE 2 PUFFS BY MOUTH EVERY 4 TO 6 HOURS AS NEEDED FOR COUGH OR WHEEZE *MAY USE 10-20 MIN PRIOR TO EXERCISE 18 g  3  . BREO ELLIPTA 100-25 MCG/INH AEPB INHALE 1 PUFF BY MOUTH DAILY AS NEEDED. 60 each 3  . EPINEPHrine 0.3 mg/0.3 mL IJ SOAJ injection Inject 0.3 mg into the muscle as needed. 1 each 2  . FLUoxetine (PROZAC) 20 MG capsule Take 20 mg by mouth daily.    . fluticasone (VERAMYST) 27.5 MCG/SPRAY nasal spray Place 2 sprays into the nose daily.    . hydrochlorothiazide (MICROZIDE) 12.5 MG capsule TAKE 1 CAPSULE BY MOUTH ONCE DAILY 90 capsule 2  . loratadine (CLARITIN) 10 MG tablet Take 10 mg by mouth daily as needed for allergies.    . mometasone (ELOCON) 0.1 % cream mometasone 0.1 % topical cream  APPLY A THIN LAYER TO THE AFFECTED AREA OF THE SKIN DAILY    . pantoprazole (PROTONIX) 40 MG tablet Take 40 mg by mouth daily.    . Probiotic Product (ALIGN PO) Take by mouth daily.    . valACYclovir (VALTREX) 500 MG tablet TAKE 1 TABLET BY MOUTH DAILY. 90 tablet 3   No facility-administered medications prior to visit.    PAST MEDICAL HISTORY: Past Medical History:  Diagnosis Date  . Angio-edema   . Asthma exacerbation, mild 02/12/2020  . GERD (gastroesophageal reflux disease)   . Hyperlipidemia   . Hypertension   . Reactive airway disease    post CAP    PAST SURGICAL HISTORY: Past Surgical History:  Procedure Laterality Date  . CARDIAC CATHETERIZATION  1997   negative  . COLONOSCOPY  2007   Dr Cristina Gong  . DILATION AND CURETTAGE OF UTERUS    . UPPER GI ENDOSCOPY     X 2  . WISDOM TOOTH EXTRACTION      FAMILY HISTORY: Family History  Adopted: Yes  Problem Relation Age of Onset  . Allergic rhinitis Mother   . Asthma Neg Hx   . Eczema Neg Hx   . Urticaria Neg Hx     SOCIAL HISTORY: Social History   Socioeconomic History  . Marital status: Single    Spouse name: Not on file  . Number of children: Not on file  . Years of education: Not on file  . Highest education level: Bachelor's degree (e.g., BA, AB, BS)  Occupational History    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  Tobacco Use  .  Smoking status: Former Smoker    Quit date: 03/14/1984    Years since quitting: 36.2  . Smokeless tobacco: Never Used  . Tobacco comment: smoked 1975-1986, up to 1.5 ppd  Vaping Use  . Vaping Use: Never used  Substance and Sexual Activity  . Alcohol use: No  . Drug use: No  . Sexual activity: Not on file  Other Topics Concern  . Not on file  Social History Narrative   Exercise: 2-4 times a week   Social Determinants of Health   Financial Resource Strain: Not on file  Food Insecurity: Not on file  Transportation Needs: Not on file  Physical Activity: Not on file  Stress: Not on file  Social Connections: Not on file  Intimate Partner Violence: Not on file     PHYSICAL EXAM  GENERAL EXAM/CONSTITUTIONAL: Vitals:  Vitals:   06/01/20 1615  BP: 126/82  Pulse: 74  Weight: 203 lb 3.2 oz (92.2 kg)  Height: 5\' 4"  (1.626 m)   Body mass index is 34.88 kg/m. Wt Readings from Last 3 Encounters:  06/01/20 203 lb 3.2 oz (92.2 kg)  01/30/20 198 lb 4 oz (89.9 kg)  01/17/20 198 lb (89.8 kg)    Patient is in no distress; well developed, nourished and groomed; neck is supple  CARDIOVASCULAR:  Examination of carotid arteries is normal; no carotid bruits  Regular rate and rhythm, no murmurs  Examination of peripheral vascular system by observation and palpation is normal  EYES:  Ophthalmoscopic exam of optic discs and posterior segments is normal; no papilledema or hemorrhages No exam data present  MUSCULOSKELETAL:  Gait, strength, tone, movements noted in Neurologic exam below  NEUROLOGIC: MENTAL STATUS:  No flowsheet data found.  awake, alert, oriented to person, place and time  recent and remote memory intact  normal attention and concentration  language fluent, comprehension intact, naming intact  fund of knowledge appropriate  CRANIAL NERVE:   2nd - no papilledema on fundoscopic exam  2nd, 3rd, 4th, 6th - pupils equal and reactive to light, visual fields  full to confrontation, extraocular muscles intact, no nystagmus  5th - facial sensation symmetric  7th - facial strength symmetric  8th - hearing intact  9th - palate elevates symmetrically, uvula midline  11th - shoulder shrug symmetric  12th - tongue protrusion midline  MOTOR:   normal bulk and tone, full strength in the BUE, BLE  SENSORY:   normal and symmetric to light touch  COORDINATION:   finger-nose-finger, fine finger movements normal  REFLEXES:   deep tendon reflexes present and symmetric  GAIT/STATION:   narrow based gait     DIAGNOSTIC DATA (LABS, IMAGING, TESTING) - I reviewed  patient records, labs, notes, testing and imaging myself where available.  Lab Results  Component Value Date   WBC 10.2 11/10/2019   HGB 14.1 11/10/2019   HCT 44.6 11/10/2019   MCV 91.0 11/10/2019   PLT 303 11/10/2019      Component Value Date/Time   NA 138 11/10/2019 0032   K 4.0 11/10/2019 0032   CL 99 11/10/2019 0032   CO2 28 11/10/2019 0032   GLUCOSE 104 (H) 11/10/2019 0032   GLUCOSE 77 01/24/2006 1211   BUN 19 11/10/2019 0032   CREATININE 0.89 11/10/2019 0032   CALCIUM 9.6 11/10/2019 0032   PROT 6.5 01/30/2020 1610   ALBUMIN 4.3 09/06/2019 1420   AST 28 09/06/2019 1420   ALT 21 09/06/2019 1420   ALKPHOS 105 09/06/2019 1420   BILITOT 0.4 09/06/2019 1420   GFRNONAA >60 11/10/2019 0032   GFRAA >60 11/10/2019 0032   Lab Results  Component Value Date   CHOL 240 (H) 09/06/2019   HDL 52.00 09/06/2019   LDLCALC 169 (H) 07/27/2018   LDLDIRECT 150.0 09/06/2019   TRIG 235.0 (H) 09/06/2019   CHOLHDL 5 09/06/2019   Lab Results  Component Value Date   HGBA1C 5.5 09/17/2015   Lab Results  Component Value Date   VITAMINB12 1,115 (H) 09/06/2019   Lab Results  Component Value Date   TSH 2.89 09/06/2019    11/27/2019 MRI brain - Unremarkable MRI brain (with and without). No acute findings.   ASSESSMENT AND PLAN  65 y.o. year old female here  with:  Meds tried: tylenol   Dx:  1. Chronic fatigue   2. Daily headache     PLAN:  HEADACHES / FATIGUE (with migraine features) - daily physical activity / exercise (at least 15-30 minutes) - eat more plants / vegetables - increase social activities, brain stimulation, games, puzzles, hobbies, crafts, arts, music - aim for at least 7-8 hours sleep per night (or more) - avoid smoking and alcohol  Return for return to PCP, pending if symptoms worsen or fail to improve.    Penni Bombard, MD 6/46/8032, 1:22 PM Certified in Neurology, Neurophysiology and Neuroimaging  [facility] [address], [place], [place] [address] [phone]

## 2020-06-05 ENCOUNTER — Other Ambulatory Visit (HOSPITAL_BASED_OUTPATIENT_CLINIC_OR_DEPARTMENT_OTHER): Payer: Self-pay

## 2020-06-11 MED FILL — BREO ELLIPTA 100-25 MCG INH: 100-25 | 30 days supply | Qty: 60 | Fill #2

## 2020-06-11 MED FILL — FLUoxetine HCL 20 MG CAPS: 20 | 90 days supply | Qty: 90 | Fill #1

## 2020-06-11 MED FILL — VALACYCLOVIR HCL 500 MG TAB: 500 | 90 days supply | Qty: 90 | Fill #3

## 2020-06-15 ENCOUNTER — Encounter: Payer: Self-pay | Admitting: Cardiology

## 2020-06-15 ENCOUNTER — Other Ambulatory Visit: Payer: Self-pay

## 2020-06-15 ENCOUNTER — Ambulatory Visit: Payer: 59 | Admitting: Cardiology

## 2020-06-15 VITALS — BP 120/76 | HR 55 | Ht 65.0 in | Wt 194.0 lb

## 2020-06-15 DIAGNOSIS — Z7189 Other specified counseling: Secondary | ICD-10-CM

## 2020-06-15 DIAGNOSIS — I10 Essential (primary) hypertension: Secondary | ICD-10-CM | POA: Diagnosis not present

## 2020-06-15 DIAGNOSIS — E782 Mixed hyperlipidemia: Secondary | ICD-10-CM | POA: Diagnosis not present

## 2020-06-15 NOTE — Progress Notes (Signed)
Cardiology Office Note:    Date:  06/15/2020   ID:  Dana Mccoy, DOB 06-Mar-1956, MRN [id number]  PCP:  Binnie Rail, MD  Cardiologist:  Buford Dresser, MD PhD  Referring MD: Binnie Rail, MD   CC: follow up  History of Present Illness:    Dana Mccoy is a 65 y.o. female with a hx of hypertension, hyperlipidemia who is seen for follow up. I initially met her 04/04/18 as a new consult at the request of Burns, Claudina Lick, MD for the evaluation and management of bradycardia.  Today: No new concerns. Still with fatigue, but doing activities around the house. Working without limitations.  No severe pain, no further ER visits. Keeps bed elevated at night with reflux. Being cautious with her diet.  Denies chest pain, shortness of breath at rest or with normal exertion. No PND, orthopnea, LE edema or unexpected weight gain. No syncope. Rare palpitations, not limiting.   ROS otherwise unremarkable except for intermittent tongue swelling/lip numbness. No etiology found. Started claritin after last episode in February and hasn't had another episode since.  We discussed her ASCVD risk, prior results, and recommendations, see below.  Past Medical History:  Diagnosis Date  . Angio-edema   . Asthma exacerbation, mild 02/12/2020  . GERD (gastroesophageal reflux disease)   . Hyperlipidemia   . Hypertension   . Reactive airway disease    post CAP    Past Surgical History:  Procedure Laterality Date  . CARDIAC CATHETERIZATION  1997   negative  . COLONOSCOPY  2007   Dr Cristina Gong  . DILATION AND CURETTAGE OF UTERUS    . UPPER GI ENDOSCOPY     X 2  . WISDOM TOOTH EXTRACTION      Current Medications: Current Outpatient Medications on File Prior to Visit  Medication Sig  . albuterol (VENTOLIN HFA) 108 (90 Base) MCG/ACT inhaler INHALE 2 PUFFS BY MOUTH EVERY 4 TO 6 HOURS AS NEEDED FOR COUGH OR WHEEZE *MAY USE 10-20 MIN PRIOR TO EXERCISE  . EPINEPHrine 0.3  mg/0.3 mL IJ SOAJ injection INJECT 0.3 MG INTO THE MUSCLE AS NEEDED.  Marland Kitchen FLUoxetine (PROZAC) 20 MG capsule Take 20 mg by mouth daily.  . fluticasone (VERAMYST) 27.5 MCG/SPRAY nasal spray Place 2 sprays into the nose daily.  . fluticasone furoate-vilanterol (BREO ELLIPTA) 100-25 MCG/INH AEPB INHALE 1 PUFF BY MOUTH ONCE A DAY AS NEEDED  . hydrochlorothiazide (MICROZIDE) 12.5 MG capsule TAKE 1 CAPSULE BY MOUTH ONCE DAILY  . loratadine (CLARITIN) 10 MG tablet Take 10 mg by mouth 2 (two) times daily. 1 Tablet Twice Daily AM and PM  . mometasone (ELOCON) 0.1 % cream mometasone 0.1 % topical cream  APPLY A THIN LAYER TO THE AFFECTED AREA OF THE SKIN DAILY  . pantoprazole (PROTONIX) 40 MG tablet TAKE 1 TABLET BY MOUTH TWO TIMES DAILY 30 MIN BEFORE BREAKFAST AND DINNER  . Probiotic Product (ALIGN PO) Take by mouth daily.  . valACYclovir (VALTREX) 500 MG tablet TAKE 1 TABLET BY MOUTH DAILY.  . [DISCONTINUED] omeprazole (PRILOSEC) 40 MG capsule TAKE 1 CAPSULE BY MOUTH ONCE DAILY   No current facility-administered medications on file prior to visit.     Allergies:   Penicillins and Lipitor [atorvastatin]   Social History   Tobacco Use  . Smoking status: Former Smoker    Quit date: 03/14/1984    Years since quitting: 36.2  . Smokeless tobacco: Never Used  . Tobacco comment: smoked 1975-1986, up to 1.5 ppd  Vaping Use  . Vaping Use: Never used  Substance Use Topics  . Alcohol use: No  . Drug use: No    Family History: The patient's family history includes Allergic rhinitis in her mother. There is no history of Asthma, Eczema, or Urticaria. She was adopted.  ROS:   Please see the history of present illness.  Additional pertinent ROS otherwise unremarkable.   EKGs/Labs/Other Studies Reviewed:    The following studies were reviewed today: CT cardiac 12/09/19  IMPRESSION: 1. Coronary calcium score 0 Agatston units, suggesting low risk for future cardiac events.  2.  No significant coronary  disease noted.  ETT 04/13/2018  There was no ST segment deviation noted during stress.  Patient noted a few seconds of chest pressure. Fleeting.  Good overall exercise tolerance of 7 minutes and 41 seconds. Excellent blood pressure response.  Overall low risk exercise treadmill test with no electrocardiographic evidence of ischemia.  Monitor 05/01/2018 4 days of data recorded on [employer] monitor. Patient had a min HR of 44 bpm, max HR of 131 bpm, and avg HR of 63 bpm. Predominant underlying rhythm was Sinus Rhythm. There was one episode of SVT, lasting 4 beats with a rate of 98 bpm. No VT, atrial fibrillation, high degree block, or pauses noted. Isolated atrial and ventricular ectopy was rare (<1%). There were 6 patient triggered events, which were consistent with sinus rhythm (one event was sinus rhythm with PVC).   MPI 1997 Negative adequate Bruce, but breast attenuation vs. Ischemia seen on imaging. Recommended for cath.  Cath 1997, reported as normal, I cannot see results.  EKG:  EKG is personally reviewed.  The ekg ordered 06/15/2020 demonstrates sinus bradycardia at 55 bpm  Recent Labs: 09/06/2019: ALT 21; TSH 2.89 11/10/2019: BUN 19; Creatinine, Ser 0.89; Hemoglobin 14.1; Platelets 303; Potassium 4.0; Sodium 138  Recent Lipid Panel    Component Value Date/Time   CHOL 240 (H) 09/06/2019 1420   CHOL 231 (H) 04/04/2018 1010   CHOL 260 (H) 09/05/2014 1026   TRIG 235.0 (H) 09/06/2019 1420   TRIG 161 (H) 09/05/2014 1026   HDL 52.00 09/06/2019 1420   HDL 55 04/04/2018 1010   HDL 59 09/05/2014 1026   CHOLHDL 5 09/06/2019 1420   VLDL 47.0 (H) 09/06/2019 1420   LDLCALC 169 (H) 07/27/2018 1600   LDLCALC 137 (H) 04/04/2018 1010   LDLCALC 169 (H) 09/05/2014 1026   LDLDIRECT 150.0 09/06/2019 1420    Physical Exam:    VS:  BP 120/76   Pulse (!) 55   Ht 5' 5"  (1.651 m)   Wt 194 lb (88 kg)   SpO2 96%   BMI 32.28 kg/m     Wt Readings from Last 3 Encounters:  06/15/20 194 lb  (88 kg)  06/01/20 203 lb 3.2 oz (92.2 kg)  01/30/20 198 lb 4 oz (89.9 kg)    GEN: Well nourished, well developed in no acute distress HEENT: Normal, moist mucous membranes NECK: No JVD CARDIAC: regular rhythm, normal S1 and S2, no rubs or gallops. No murmur. VASCULAR: Radial and DP pulses 2+ bilaterally. No carotid bruits RESPIRATORY:  Clear to auscultation without rales, wheezing or rhonchi  ABDOMEN: Soft, non-tender, non-distended MUSCULOSKELETAL:  Ambulates independently SKIN: Warm and dry, no edema NEUROLOGIC:  Alert and oriented x 3. No focal neuro deficits noted. PSYCHIATRIC:  Normal affect   ASSESSMENT:    1. Essential hypertension   2. Mixed hyperlipidemia   3. Cardiac risk counseling   4. Counseling on  health promotion and disease prevention    PLAN:    History of chest pressure: CT cardiac unremarkable, symptoms improved with management of GI symptoms  Palpitations: rare -reviewed prior monitor results  Hyperlipidemia, with hypertriglyceridemia:  -did not tolerate atorvastatin (cognitive impairment) -given no CAD on CT and calcium score of 0, no indication for statin at this time  Hypertension: at goal today -continue HCTZ  Prevention and counseling -recommend heart healthy/Mediterranean diet, with whole grains, fruits, vegetable, fish, lean meats, nuts, and olive oil. Limit salt. -recommend moderate walking, 3-5 times/week for 30-50 minutes each session. Aim for at least 150 minutes.week. Goal should be pace of 3 miles/hours, or walking 1.5 miles in 30 minutes -recommend avoidance of tobacco products. Avoid excess alcohol. -Additional risk factor control:  -Diabetes: A1c is 5.5 in 2017  -Weight: BMI 32  -snoring: had sleep study 05/2018, no significant sleep apnea -ASCVD risk score: The 10-year ASCVD risk score Mikey Bussing DC Brooke Bonito., et al., 2013) is: 7.4%   Values used to calculate the score:     Age: 75 years     Sex: Female     Is Non-Hispanic African American:  No     Diabetic: No     Tobacco smoker: No     Systolic Blood Pressure: 606 mmHg     Is BP treated: Yes     HDL Cholesterol: 52 mg/dL     Total Cholesterol: 240 mg/dL   Plan for follow up: every 2 years per preference  Medication Adjustments/Labs and Tests Ordered: Current medicines are reviewed at length with the patient today.  Concerns regarding medicines are outlined above.  Orders Placed This Encounter  Procedures  . EKG 12-Lead   No orders of the defined types were placed in this encounter.   Patient Instructions  Medication Instructions:  Your Physician recommend you continue on your current medication as directed.    *If you need a refill on your cardiac medications before your next appointment, please call your pharmacy*   Lab Work: None   Testing/Procedures: None   Follow-Up: At [facility], you and your health needs are our priority.  As part of our continuing mission to provide you with exceptional heart care, we have created designated Provider Care Teams.  These Care Teams include your primary Cardiologist (physician) and Advanced Practice Providers (APPs -  Physician Assistants and Nurse Practitioners) who all work together to provide you with the care you need, when you need it.  We recommend signing up for the patient portal called "MyChart".  Sign up information is provided on this After Visit Summary.  MyChart is used to connect with patients for Virtual Visits (Telemedicine).  Patients are able to view lab/test results, encounter notes, upcoming appointments, etc.  Non-urgent messages can be sent to your provider as well.   To learn more about what you can do with MyChart, go to [email or web address].    Your next appointment:   2 year(s) @ [address] [place], [place] [address]   The format for your next appointment:   In Person  Provider:   Buford Dresser, MD        Signed, Buford Dresser, MD PhD 06/15/2020    [facility]

## 2020-06-15 NOTE — Patient Instructions (Signed)
Medication Instructions:  Your Physician recommend you continue on your current medication as directed.    *If you need a refill on your cardiac medications before your next appointment, please call your pharmacy*   Lab Work: None   Testing/Procedures: None   Follow-Up: At [facility], you and your health needs are our priority.  As part of our continuing mission to provide you with exceptional heart care, we have created designated Provider Care Teams.  These Care Teams include your primary Cardiologist (physician) and Advanced Practice Providers (APPs -  Physician Assistants and Nurse Practitioners) who all work together to provide you with the care you need, when you need it.  We recommend signing up for the patient portal called "MyChart".  Sign up information is provided on this After Visit Summary.  MyChart is used to connect with patients for Virtual Visits (Telemedicine).  Patients are able to view lab/test results, encounter notes, upcoming appointments, etc.  Non-urgent messages can be sent to your provider as well.   To learn more about what you can do with MyChart, go to [email or web address].    Your next appointment:   2 year(s) @ [address] [place], [place] [address]   The format for your next appointment:   In Person  Provider:   Buford Dresser, MD

## 2020-07-21 ENCOUNTER — Other Ambulatory Visit (HOSPITAL_COMMUNITY): Payer: Self-pay

## 2020-07-21 ENCOUNTER — Other Ambulatory Visit: Payer: Self-pay | Admitting: Internal Medicine

## 2020-07-21 MED ORDER — HYDROCHLOROTHIAZIDE 12.5 MG PO CAPS
ORAL_CAPSULE | Freq: Every day | ORAL | 2 refills | Status: DC
  Filled 2020-07-21: qty 90, 90d supply, fill #0

## 2020-07-21 MED FILL — Pantoprazole Sodium EC Tab 40 MG (Base Equiv): ORAL | 90 days supply | Qty: 180 | Fill #0 | Status: AC

## 2020-08-04 ENCOUNTER — Other Ambulatory Visit (HOSPITAL_COMMUNITY): Payer: Self-pay

## 2020-08-04 MED FILL — Epinephrine Solution Auto-injector 0.3 MG/0.3ML (1:1000): INTRAMUSCULAR | 2 days supply | Qty: 2 | Fill #0 | Status: AC

## 2020-08-04 MED FILL — Fluticasone Furoate-Vilanterol Aero Powd BA 100-25 MCG/ACT: RESPIRATORY_TRACT | 30 days supply | Qty: 60 | Fill #0 | Status: AC

## 2020-08-15 ENCOUNTER — Encounter: Payer: Self-pay | Admitting: Internal Medicine

## 2020-08-17 ENCOUNTER — Encounter: Payer: Self-pay | Admitting: Internal Medicine

## 2020-08-17 ENCOUNTER — Other Ambulatory Visit: Payer: Self-pay

## 2020-08-17 ENCOUNTER — Ambulatory Visit (INDEPENDENT_AMBULATORY_CARE_PROVIDER_SITE_OTHER): Payer: Medicare Other | Admitting: Internal Medicine

## 2020-08-17 VITALS — BP 120/78 | HR 74 | Temp 98.1°F | Ht 65.0 in | Wt 204.0 lb

## 2020-08-17 DIAGNOSIS — N3000 Acute cystitis without hematuria: Secondary | ICD-10-CM

## 2020-08-17 DIAGNOSIS — R350 Frequency of micturition: Secondary | ICD-10-CM

## 2020-08-17 DIAGNOSIS — H2000 Unspecified acute and subacute iridocyclitis: Secondary | ICD-10-CM | POA: Insufficient documentation

## 2020-08-17 MED ORDER — NITROFURANTOIN MONOHYD MACRO 100 MG PO CAPS: 100.0000 mg | ORAL_CAPSULE | Freq: Two times a day (BID) | ORAL | 0 refills | Status: DC

## 2020-08-17 NOTE — Progress Notes (Signed)
Subjective:    Patient ID: Dana Mccoy, female    DOB: 1955-04-23, 65 y.o.   MRN: [id number]  HPI The patient is here for an acute visit.   ? UTI:  Her symptoms started about 7 days ago.  She states feeling off, pelvic discomfort, urinary frequency w/o going a lot, urinary urgency.     Medications and allergies reviewed with patient and updated if appropriate.  Patient Active Problem List   Diagnosis Date Noted  . Asthma exacerbation, mild 02/12/2020  . Adverse food reaction 01/30/2020  . Angio-edema 01/17/2020  . Frequent headaches 01/17/2020  . DDD (degenerative disc disease), cervical 09/24/2019  . Numbness and tingling 09/06/2019  . Low iron 09/06/2019  . Tremor 09/06/2019  . Restless leg syndrome 07/27/2018  . Chronic pain of left knee 04/17/2018  . Right low back pain 04/17/2018  . Fatigue 08/21/2017  . Bradycardia 08/21/2017  . Atypical chest pain 08/21/2017  . Anxiety 12/08/2013  . Obesity (BMI 30-39.9) 08/02/2013  . Other allergic rhinitis 08/02/2013  . Acute bronchitis 04/02/2013  . Cervical stenosis of spine 04/26/2011  . Hyperlipidemia 06/05/2007  . Reactive airway disease 06/05/2007  . Gastroesophageal reflux disease 06/05/2007    Current Outpatient Medications on File Prior to Visit  Medication Sig Dispense Refill  . albuterol (VENTOLIN HFA) 108 (90 Base) MCG/ACT inhaler INHALE 2 PUFFS BY MOUTH EVERY 4 TO 6 HOURS AS NEEDED FOR COUGH OR WHEEZE *MAY USE 10-20 MIN PRIOR TO EXERCISE 18 g 3  . EPINEPHrine 0.3 mg/0.3 mL IJ SOAJ injection INJECT 0.3 MG INTO THE MUSCLE AS NEEDED. 2 each 2  . FLUoxetine (PROZAC) 20 MG capsule Take 20 mg by mouth daily.    . fluticasone (VERAMYST) 27.5 MCG/SPRAY nasal spray Place 2 sprays into the nose daily.    . fluticasone furoate-vilanterol (BREO ELLIPTA) 100-25 MCG/INH AEPB INHALE 1 PUFF BY MOUTH ONCE A DAY AS NEEDED 60 each 3  . hydrochlorothiazide (MICROZIDE) 12.5 MG capsule TAKE 1 CAPSULE BY MOUTH ONCE DAILY  90 capsule 2  . loratadine (CLARITIN) 10 MG tablet Take 10 mg by mouth 2 (two) times daily. 1 Tablet Twice Daily AM and PM    . mometasone (ELOCON) 0.1 % cream mometasone 0.1 % topical cream  APPLY A THIN LAYER TO THE AFFECTED AREA OF THE SKIN DAILY    . pantoprazole (PROTONIX) 40 MG tablet TAKE 1 TABLET BY MOUTH TWO TIMES DAILY 30 MIN BEFORE BREAKFAST AND DINNER 180 tablet 3  . Probiotic Product (ALIGN PO) Take by mouth daily.    . valACYclovir (VALTREX) 500 MG tablet Take 500 mg by mouth 2 (two) times daily.    Marland Kitchen VITAMIN D, CHOLECALCIFEROL, PO Take 400 mg by mouth.    . [DISCONTINUED] omeprazole (PRILOSEC) 40 MG capsule TAKE 1 CAPSULE BY MOUTH ONCE DAILY 90 capsule 0   No current facility-administered medications on file prior to visit.    Past Medical History:  Diagnosis Date  . Angio-edema   . Asthma exacerbation, mild 02/12/2020  . GERD (gastroesophageal reflux disease)   . Hyperlipidemia   . Hypertension   . Reactive airway disease    post CAP    Past Surgical History:  Procedure Laterality Date  . CARDIAC CATHETERIZATION  1997   negative  . COLONOSCOPY  2007   Dr Cristina Gong  . DILATION AND CURETTAGE OF UTERUS    . UPPER GI ENDOSCOPY     X 2  . WISDOM TOOTH EXTRACTION  Social History   Socioeconomic History  . Marital status: Single    Spouse name: Not on file  . Number of children: Not on file  . Years of education: Not on file  . Highest education level: Bachelor's degree (e.g., BA, AB, BS)  Occupational History    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  Tobacco Use  . Smoking status: Former Smoker    Quit date: 03/14/1984    Years since quitting: 36.4  . Smokeless tobacco: Never Used  . Tobacco comment: smoked 1975-1986, up to 1.5 ppd  Vaping Use  . Vaping Use: Never used  Substance and Sexual Activity  . Alcohol use: No  . Drug use: No  . Sexual activity: Not on file  Other Topics Concern  . Not on file  Social History Narrative   Exercise: 2-4 times a week    Social Determinants of Health   Financial Resource Strain: Not on file  Food Insecurity: Not on file  Transportation Needs: Not on file  Physical Activity: Not on file  Stress: Not on file  Social Connections: Not on file    Family History  Adopted: Yes  Problem Relation Age of Onset  . Allergic rhinitis Mother   . Asthma Neg Hx   . Eczema Neg Hx   . Urticaria Neg Hx     Review of Systems  Constitutional: Negative for chills and fever.  Gastrointestinal: Negative for abdominal pain and nausea.  Genitourinary: Positive for frequency and urgency. Negative for dysuria and hematuria.  Musculoskeletal: Negative for back pain.       Objective:   Vitals:   08/17/20 1348  BP: 120/78  Pulse: 74  Temp: 98.1 F (36.7 C)  SpO2: 97%   BP Readings from Last 3 Encounters:  08/17/20 120/78  06/15/20 120/76  06/01/20 126/82   Wt Readings from Last 3 Encounters:  08/17/20 204 lb (92.5 kg)  06/15/20 194 lb (88 kg)  06/01/20 203 lb 3.2 oz (92.2 kg)   Body mass index is 33.95 kg/m.   Physical Exam Constitutional:      General: She is not in acute distress.    Appearance: Normal appearance. She is not ill-appearing.  HENT:     Head: Normocephalic and atraumatic.  Abdominal:     General: There is no distension.     Palpations: Abdomen is soft.     Tenderness: There is abdominal tenderness (mild discomfort in suprapubic region). There is no right CVA tenderness, left CVA tenderness, guarding or rebound.  Skin:    General: Skin is warm and dry.  Neurological:     Mental Status: She is alert.            Assessment & Plan:    See Problem List for Assessment and Plan of chronic medical problems.    This visit occurred during the SARS-CoV-2 public health emergency.  Safety protocols were in place, including screening questions prior to the visit, additional usage of staff PPE, and extensive cleaning of exam room while observing appropriate contact time as  indicated for disinfecting solutions.

## 2020-08-17 NOTE — Patient Instructions (Signed)
Take the antibiotic as prescribed.  Take tylenol if needed.     Increase your water intake.   Call if no improvement     Urinary Tract Infection, Adult A urinary tract infection (UTI) is an infection of any part of the urinary tract, which includes the kidneys, ureters, bladder, and urethra. These organs make, store, and get rid of urine in the body. UTI can be a bladder infection (cystitis) or kidney infection (pyelonephritis). What are the causes? This infection may be caused by fungi, viruses, or bacteria. Bacteria are the most common cause of UTIs. This condition can also be caused by repeated incomplete emptying of the bladder during urination. What increases the risk? This condition is more likely to develop if:  You ignore your need to urinate or hold urine for long periods of time.  You do not empty your bladder completely during urination.  You wipe back to front after urinating or having a bowel movement, if you are female.  You are uncircumcised, if you are female.  You are constipated.  You have a urinary catheter that stays in place (indwelling).  You have a weak defense (immune) system.  You have a medical condition that affects your bowels, kidneys, or bladder.  You have diabetes.  You take antibiotic medicines frequently or for long periods of time, and the antibiotics no longer work well against certain types of infections (antibiotic resistance).  You take medicines that irritate your urinary tract.  You are exposed to chemicals that irritate your urinary tract.  You are female.  What are the signs or symptoms? Symptoms of this condition include:  Fever.  Frequent urination or passing small amounts of urine frequently.  Needing to urinate urgently.  Pain or burning with urination.  Urine that smells bad or unusual.  Cloudy urine.  Pain in the lower abdomen or back.  Trouble urinating.  Blood in the urine.  Vomiting or being less hungry than  normal.  Diarrhea or abdominal pain.  Vaginal discharge, if you are female.  How is this diagnosed? This condition is diagnosed with a medical history and physical exam. You will also need to provide a urine sample to test your urine. Other tests may be done, including:  Blood tests.  Sexually transmitted disease (STD) testing.  If you have had more than one UTI, a cystoscopy or imaging studies may be done to determine the cause of the infections. How is this treated? Treatment for this condition often includes a combination of two or more of the following:  Antibiotic medicine.  Other medicines to treat less common causes of UTI.  Over-the-counter medicines to treat pain.  Drinking enough water to stay hydrated.  Follow these instructions at home:  Take over-the-counter and prescription medicines only as told by your health care provider.  If you were prescribed an antibiotic, take it as told by your health care provider. Do not stop taking the antibiotic even if you start to feel better.  Avoid alcohol, caffeine, tea, and carbonated beverages. They can irritate your bladder.  Drink enough fluid to keep your urine clear or pale yellow.  Keep all follow-up visits as told by your health care provider. This is important.  Make sure to: ? Empty your bladder often and completely. Do not hold urine for long periods of time. ? Empty your bladder before and after sex. ? Wipe from front to back after a bowel movement if you are female. Use each tissue one time when you   wipe. Contact a health care provider if:  You have back pain.  You have a fever.  You feel nauseous or vomit.  Your symptoms do not get better after 3 days.  Your symptoms go away and then return. Get help right away if:  You have severe back pain or lower abdominal pain.  You are vomiting and cannot keep down any medicines or water. This information is not intended to replace advice given to you by  your health care provider. Make sure you discuss any questions you have with your health care provider. Document Released: 12/08/2004 Document Revised: 08/12/2015 Document Reviewed: 01/19/2015 Elsevier Interactive Patient Education  2018 [employer]   

## 2020-08-17 NOTE — Assessment & Plan Note (Signed)
Acute Urine dip consistent with UTI Will send urine for culture Take the antibiotic as prescribed.  macrobid 100 mg bid x 7 days Take tylenol if needed.   Increase your water intake.  Call if no improvement

## 2020-08-18 LAB — POC URINALSYSI DIPSTICK (AUTOMATED)
Bilirubin, UA: NEGATIVE
Blood, UA: NEGATIVE
Glucose, UA: NEGATIVE
Ketones, UA: NEGATIVE
Nitrite, UA: NEGATIVE
Protein, UA: NEGATIVE
Spec Grav, UA: 1.015 (ref 1.010–1.025)
Urobilinogen, UA: 0.2 E.U./dL
pH, UA: 6 (ref 5.0–8.0)

## 2020-08-18 LAB — URINE CULTURE

## 2020-08-20 ENCOUNTER — Other Ambulatory Visit (HOSPITAL_BASED_OUTPATIENT_CLINIC_OR_DEPARTMENT_OTHER): Payer: Self-pay

## 2020-09-03 ENCOUNTER — Other Ambulatory Visit (HOSPITAL_BASED_OUTPATIENT_CLINIC_OR_DEPARTMENT_OTHER): Payer: Self-pay

## 2020-09-03 MED ORDER — COVID-19 AT HOME ANTIGEN TEST VI KIT
PACK | 0 refills | Status: DC
  Filled 2020-09-03: qty 4, 8d supply, fill #0

## 2020-09-11 ENCOUNTER — Other Ambulatory Visit (HOSPITAL_BASED_OUTPATIENT_CLINIC_OR_DEPARTMENT_OTHER): Payer: Self-pay

## 2020-09-11 ENCOUNTER — Telehealth (INDEPENDENT_AMBULATORY_CARE_PROVIDER_SITE_OTHER): Payer: Medicare Other | Admitting: Family Medicine

## 2020-09-11 ENCOUNTER — Encounter: Payer: Self-pay | Admitting: Family Medicine

## 2020-09-11 DIAGNOSIS — R0982 Postnasal drip: Secondary | ICD-10-CM | POA: Diagnosis not present

## 2020-09-11 DIAGNOSIS — J069 Acute upper respiratory infection, unspecified: Secondary | ICD-10-CM | POA: Diagnosis not present

## 2020-09-11 DIAGNOSIS — J45901 Unspecified asthma with (acute) exacerbation: Secondary | ICD-10-CM

## 2020-09-11 MED ORDER — BENZONATATE 100 MG PO CAPS
100.0000 mg | ORAL_CAPSULE | Freq: Two times a day (BID) | ORAL | 0 refills | Status: DC | PRN
  Filled 2020-09-11: qty 20, 10d supply, fill #0

## 2020-09-11 NOTE — Progress Notes (Signed)
Virtual Visit via Video Note  I connected with on 09/11/20 at  8:00 AM EDT by a video enabled telemedicine application 2/2 [id number] pandemic and verified that I am speaking with the correct person using two identifiers.  Location patient: home Location provider:work or home office Persons participating in the virtual visit: patient, provider  I discussed the limitations of evaluation and management by telemedicine and the availability of in person appointments. The patient expressed understanding and agreed to proceed.   HPI: Pt started coughing last Wed while in [place] visiting family.  She then developed wheezing, SOB, and just "feeling bad".  Now with sore throat on one side.  Using inhaler.  HA off and on, intermittent L ear pain/pressure. Taking tylenol and ibuprofen, gargling with warm salt water, Tussin DM, left over hydrocodone cough syrup at night.  Denies fever, loss of taste or smell.  Pt's grandson had a cold last wk.  Pt had a negative COVID test last Friday 6/24.  She returned from [place] last Thursday, Taking claritin and flonase.   ROS: See pertinent positives and negatives per HPI.  Past Medical History:  Diagnosis Date   Angio-edema    Asthma exacerbation, mild 02/12/2020   GERD (gastroesophageal reflux disease)    Hyperlipidemia    Hypertension    Reactive airway disease    post CAP    Past Surgical History:  Procedure Laterality Date   CARDIAC CATHETERIZATION  1997   negative   COLONOSCOPY  2007   Dr Cristina Gong   DILATION AND CURETTAGE OF UTERUS     UPPER GI ENDOSCOPY     X 2   WISDOM TOOTH EXTRACTION      Family History  Adopted: Yes  Problem Relation Age of Onset   Allergic rhinitis Mother    Asthma Neg Hx    Eczema Neg Hx    Urticaria Neg Hx     Current Outpatient Medications:    albuterol (VENTOLIN HFA) 108 (90 Base) MCG/ACT inhaler, INHALE 2 PUFFS BY MOUTH EVERY 4 TO 6 HOURS AS NEEDED FOR COUGH OR WHEEZE *MAY USE 10-20 MIN PRIOR TO EXERCISE, Disp: 18 g,  Rfl: 3   COVID-19 At Home Antigen Test KIT, Use as directed, Disp: 4 kit, Rfl: 0   EPINEPHrine 0.3 mg/0.3 mL IJ SOAJ injection, INJECT 0.3 MG INTO THE MUSCLE AS NEEDED., Disp: 2 each, Rfl: 2   FLUoxetine (PROZAC) 20 MG capsule, Take 20 mg by mouth daily., Disp: , Rfl:    fluticasone (VERAMYST) 27.5 MCG/SPRAY nasal spray, Place 2 sprays into the nose daily., Disp: , Rfl:    fluticasone furoate-vilanterol (BREO ELLIPTA) 100-25 MCG/INH AEPB, INHALE 1 PUFF BY MOUTH ONCE A DAY AS NEEDED, Disp: 60 each, Rfl: 3   hydrochlorothiazide (MICROZIDE) 12.5 MG capsule, TAKE 1 CAPSULE BY MOUTH ONCE DAILY, Disp: 90 capsule, Rfl: 2   loratadine (CLARITIN) 10 MG tablet, Take 10 mg by mouth 2 (two) times daily. 1 Tablet Twice Daily AM and PM, Disp: , Rfl:    mometasone (ELOCON) 0.1 % cream, mometasone 0.1 % topical cream  APPLY A THIN LAYER TO THE AFFECTED AREA OF THE SKIN DAILY, Disp: , Rfl:    nitrofurantoin, macrocrystal-monohydrate, (MACROBID) 100 MG capsule, Take 1 capsule (100 mg total) by mouth 2 (two) times daily., Disp: 14 capsule, Rfl: 0   pantoprazole (PROTONIX) 40 MG tablet, TAKE 1 TABLET BY MOUTH TWO TIMES DAILY 30 MIN BEFORE BREAKFAST AND DINNER, Disp: 180 tablet, Rfl: 3   Probiotic Product (ALIGN PO),  Take by mouth daily., Disp: , Rfl:    valACYclovir (VALTREX) 500 MG tablet, Take 500 mg by mouth 2 (two) times daily., Disp: , Rfl:    VITAMIN D, CHOLECALCIFEROL, PO, Take 400 mg by mouth., Disp: , Rfl:   EXAM:  VITALS per patient if applicable:  RR between 12-20 bpm  GENERAL: alert, oriented, appears well and in no acute distress  HEENT: atraumatic, conjunctiva clear, no obvious abnormalities on inspection of external nose and ears  NECK: normal movements of the head and neck  LUNGS: on inspection no signs of respiratory distress, breathing rate appears normal, no obvious gross SOB, gasping or wheezing  CV: no obvious cyanosis  MS: moves all visible extremities without noticeable  abnormality  PSYCH/NEURO: pleasant and cooperative, no obvious depression or anxiety, speech and thought processing grossly intact  ASSESSMENT AND PLAN:  Discussed the following assessment and plan:  Viral URI with cough  -Negative COVID test 09/04/20 -continue supportive care including rest, hydration gargling with warm salt water, Chloraseptic spray, steam, OTC cough/cold medications, inhaler, and antihistamines -Given precautions - Plan: benzonatate (TESSALON) 100 MG capsule  Post-nasal drainage -Continue antihistamines including Claritin -Continue Flonase -Also consider honey  Asthma exacerbation, mild -2/2 increased postnasal drainage and viral etiology -Continue albuterol inhaler as needed -Consider prednisone for increased or continued symptoms  Follow-up as needed   I discussed the assessment and treatment plan with the patient. The patient was provided an opportunity to ask questions and all were answered. The patient agreed with the plan and demonstrated an understanding of the instructions.   The patient was advised to call back or seek an in-person evaluation if the symptoms worsen or if the condition fails to improve as anticipated.  Billie Ruddy, MD

## 2020-09-16 ENCOUNTER — Encounter: Payer: Self-pay | Admitting: Internal Medicine

## 2020-09-16 ENCOUNTER — Other Ambulatory Visit: Payer: Self-pay

## 2020-09-16 MED ORDER — FLUOXETINE HCL 20 MG PO CAPS: 20.0000 mg | ORAL_CAPSULE | Freq: Every day | ORAL | 0 refills | Status: DC

## 2020-09-16 MED ORDER — ACYCLOVIR 400 MG PO TABS: 400.0000 mg | ORAL_TABLET | Freq: Two times a day (BID) | ORAL | 1 refills | Status: DC

## 2020-10-19 ENCOUNTER — Other Ambulatory Visit (HOSPITAL_COMMUNITY): Payer: Self-pay

## 2020-10-20 ENCOUNTER — Telehealth: Payer: Self-pay | Admitting: Internal Medicine

## 2020-10-20 MED ORDER — FLUTICASONE FUROATE-VILANTEROL 100-25 MCG/INH IN AEPB: INHALATION_SPRAY | RESPIRATORY_TRACT | 3 refills | Status: DC

## 2020-10-20 NOTE — Telephone Encounter (Signed)
Faxed today

## 2020-10-27 ENCOUNTER — Other Ambulatory Visit: Payer: Self-pay

## 2020-10-27 ENCOUNTER — Ambulatory Visit (INDEPENDENT_AMBULATORY_CARE_PROVIDER_SITE_OTHER): Payer: Medicare Other | Admitting: Allergy & Immunology

## 2020-10-27 ENCOUNTER — Encounter: Payer: Self-pay | Admitting: Allergy & Immunology

## 2020-10-27 VITALS — BP 132/80 | HR 67 | Temp 97.5°F | Resp 16 | Ht 65.0 in | Wt 207.4 lb

## 2020-10-27 DIAGNOSIS — K219 Gastro-esophageal reflux disease without esophagitis: Secondary | ICD-10-CM | POA: Diagnosis not present

## 2020-10-27 DIAGNOSIS — J4540 Moderate persistent asthma, uncomplicated: Secondary | ICD-10-CM | POA: Diagnosis not present

## 2020-10-27 DIAGNOSIS — T7840XD Allergy, unspecified, subsequent encounter: Secondary | ICD-10-CM

## 2020-10-27 MED ORDER — FLUTICASONE FUROATE-VILANTEROL 100-25 MCG/INH IN AEPB: 1.0000 | INHALATION_SPRAY | Freq: Every day | RESPIRATORY_TRACT | 5 refills | Status: DC

## 2020-10-27 MED ORDER — LORATADINE 10 MG PO TABS: 10.0000 mg | ORAL_TABLET | Freq: Two times a day (BID) | ORAL | 5 refills | Status: AC

## 2020-10-27 MED ORDER — EPINEPHRINE 0.3 MG/0.3ML IJ SOAJ: 0.3000 mg | INTRAMUSCULAR | 1 refills | Status: AC | PRN

## 2020-10-27 MED ORDER — ALBUTEROL SULFATE HFA 108 (90 BASE) MCG/ACT IN AERS: 1.0000 | INHALATION_SPRAY | RESPIRATORY_TRACT | 1 refills | Status: DC | PRN

## 2020-10-27 NOTE — Patient Instructions (Addendum)
1. Moderate persistent reactive airway disease without complication - Lung testing looked normal. - We are not going to make any medication changes at this time. - Daily controller medication(s): Breo 100/33mg one puff once daily - Prior to physical activity: albuterol 2 puffs 10-15 minutes before physical activity. - Rescue medications: albuterol 4 puffs every 4-6 hours as needed - Asthma control goals:  * Full participation in all desired activities (may need albuterol before activity) * Albuterol use two time or less a week on average (not counting use with activity) * Cough interfering with sleep two time or less a month * Oral steroids no more than once a year * No hospitalizations  2. Gastroesophageal reflux disease - Continue with pantoprazole '40mg'$  twice daily.  3. Allergic reaction - unknown trigger - EpiPen is up to date.  - Continue with Claritin '10mg'$  twice daily as you are doing.  4. Return in about 6 months (around 04/29/2021).    Please inform [place] any Emergency Department visits, hospitalizations, or changes in symptoms. Call [place] going to the ED for breathing or allergy symptoms since we might be able to fit you in for a sick visit. Feel free to contact [place]time with any questions, problems, or concerns.  It was a pleasure to see you again today! Enjoy your trip!  Websites that have reliable patient information: 1. American Academy of Asthma, Allergy, and Immunology: www.aaaai.org 2. Food Allergy Research and Education (FARE): foodallergy.org 3. Mothers of Asthmatics: http://www.asthmacommunitynetwork.org 4. American College of Allergy, Asthma, and Immunology: www.acaai.org   COVID-19 Vaccine Information can be found at: [email or web address] questions related to vaccine distribution or appointments, please email [email or web address] or call [phone]   We realize that you might be concerned  about having an allergic reaction to the COVID19 vaccines. To help with that concern, WE ARE OFFERING THE COVID19 VACCINES IN OUR OFFICE! Ask the front desk for dates!     "Like" [facility] Facebook and Instagram for our latest updates!      A healthy democracy works best when ANew [employer] Life Insuranceparticipate! Make sure you are registered to vote! If you have moved or changed any of your contact information, you will need to get this updated before voting!  In some cases, you MAY be able to register to vote online: [email or web address]

## 2020-10-27 NOTE — Progress Notes (Signed)
FOLLOW UP  Date of Service/Encounter:  10/27/20   Assessment:   Angioedema   Allergic rhinitis   Moderate persistent asthma, uncomplicated   GERD  Plan/Recommendations:    1. Moderate persistent reactive airway disease without complication - Lung testing looked normal. - We are not going to make any medication changes at this time. - Daily controller medication(s): Breo 100/25mg one puff once daily - Prior to physical activity: albuterol 2 puffs 10-15 minutes before physical activity. - Rescue medications: albuterol 4 puffs every 4-6 hours as needed - Asthma control goals:  * Full participation in all desired activities (may need albuterol before activity) * Albuterol use two time or less a week on average (not counting use with activity) * Cough interfering with sleep two time or less a month * Oral steroids no more than once a year * No hospitalizations  2. Gastroesophageal reflux disease - Continue with pantoprazole '40mg'$  twice daily.  3. Allergic reaction - unknown trigger - EpiPen is up to date.  - Continue with Claritin '10mg'$  twice daily as you are doing.  4. Return in about 6 months (around 04/29/2021).    Subjective:   Dana Mccoy a 65y.o. female presenting today for follow up of  Chief Complaint  Patient presents with   Allergic Rhinitis    Asthma    LVestal Mccoy a history of the following: Patient Active Problem List   Diagnosis Date Noted   Acute cystitis 08/17/2020   Asthma exacerbation, mild 02/12/2020   Adverse food reaction 01/30/2020   Angio-edema 01/17/2020   Frequent headaches 01/17/2020   DDD (degenerative disc disease), cervical 09/24/2019   Numbness and tingling 09/06/2019   Low iron 09/06/2019   Tremor 09/06/2019   Restless leg syndrome 07/27/2018   Chronic pain of left knee 04/17/2018   Right low back pain 04/17/2018   Fatigue 08/21/2017   Bradycardia 08/21/2017   Atypical chest pain  08/21/2017   Anxiety 12/08/2013   Obesity (BMI 30-39.9) 08/02/2013   Other allergic rhinitis 08/02/2013   Acute bronchitis 04/02/2013   Cervical stenosis of spine 04/26/2011   Hyperlipidemia 06/05/2007   Reactive airway disease 06/05/2007   Gastroesophageal reflux disease 06/05/2007    History obtained from: chart review and patient.  Dana Mccoy a 65y.o. female presenting for a follow up visit.  She was last seen in February 2022.  At that time, we obtained a serum tryptase which was normal.  We recommended increasing the Claritin to twice daily.  We did give her a pack of prednisone to see if that would help.  She had a thorough work-up previously with Dr. KMaudie Mercury  Review of her testing shows that she had negative testing to peanuts, tree nuts, beef, pork, and wheat.  It was recommended that she take an antihistamine every day to prevent a lot of these reactions.    She has had lab work that is been negative for hereditary angioedema, tryptase, inflammatory markers, SPEP, and UPEP.  An alpha gal panel was negative as well.  She is liking her job a lot. She is working only two days per week. She also has a 93yoneighbor who has CHF and spends time doing that.   Her last reaction was in May 2022. She was supposed to go out of the town for the day. She was with her son and she took all of her medications and they decided to pull into an ED if it got worse. She  was picking up a dog and driving, but her son was with her. She got a dachshund.  She does not know of any particular trigger.  She thinks that the Claritin might be helping suppress the reactions, but reassuringly she has at least been able to avoid going to the emergency room and needing epinephrine.   Asthma/Respiratory Symptom History: Asthma seems to be well controlled.  She remains on Breo 1 puff once daily.  This is still affordable for her.  She has been doing very well and has been out of the emergency room.  She does not remember the  last time she needed prednisone for her breathing.  She is coughing a bit today, which she said was triggered by doing the spirometry today.  Allergic Rhinitis Symptom History: The Claritin that she takes twice a day seems to control her allergic rhinitis.  She is on this to help suppress any allergic reactions.  She has not needed antibiotics.  She is going to [facility] tomorrow to be with her daughter.  Her daughter and son-in-law have 2 children under the age of 37.  She is going to be taking care of them for a few days well her daughter and son-in-law transition into some classes they are taking.  She loves her son-in-law.  She does have a son who lives in [place] who is not married and has no children.  Otherwise, there have been no changes to her past medical history, surgical history, family history, or social history.    Review of Systems  Constitutional: Negative.  Negative for chills, fever, malaise/fatigue and weight loss.  HENT: Negative.  Negative for congestion, ear discharge and ear pain.   Eyes:  Negative for pain, discharge and redness.  Respiratory:  Negative for cough, sputum production, shortness of breath and wheezing.   Cardiovascular: Negative.  Negative for chest pain and palpitations.  Gastrointestinal:  Negative for abdominal pain, constipation, diarrhea, heartburn, nausea and vomiting.  Skin: Negative.  Negative for itching and rash.  Neurological:  Negative for dizziness and headaches.  Endo/Heme/Allergies:  Negative for environmental allergies. Does not bruise/bleed easily.      Objective:   Blood pressure 132/80, pulse 67, temperature (!) 97.5 F (36.4 C), resp. rate 16, height '5\' 5"'$  (1.651 m), weight 207 lb 6.4 oz (94.1 kg), SpO2 96 %. Body mass index is 34.51 kg/m.   Physical Exam:  Physical Exam Vitals reviewed.  Constitutional:      Appearance: She is well-developed.     Comments: Talkative female.  Cooperative with the exam.  HENT:     Head:  Normocephalic and atraumatic.     Right Ear: Tympanic membrane, ear canal and external ear normal.     Left Ear: Tympanic membrane, ear canal and external ear normal.     Nose: No nasal deformity, septal deviation, mucosal edema or rhinorrhea.     Right Turbinates: Enlarged and swollen.     Left Turbinates: Enlarged and swollen.     Right Sinus: No maxillary sinus tenderness or frontal sinus tenderness.     Left Sinus: No maxillary sinus tenderness or frontal sinus tenderness.     Mouth/Throat:     Mouth: Mucous membranes are not pale and not dry.     Pharynx: Uvula midline.  Eyes:     General: Lids are normal. No allergic shiner.       Right eye: No discharge.        Left eye: No discharge.  Conjunctiva/sclera: Conjunctivae normal.     Right eye: Right conjunctiva is not injected. No chemosis.    Left eye: Left conjunctiva is not injected. No chemosis.    Pupils: Pupils are equal, round, and reactive to light.  Cardiovascular:     Rate and Rhythm: Normal rate and regular rhythm.     Heart sounds: Normal heart sounds.  Pulmonary:     Effort: Pulmonary effort is normal. No tachypnea, accessory muscle usage or respiratory distress.     Breath sounds: Normal breath sounds. No wheezing, rhonchi or rales.  Chest:     Chest wall: No tenderness.  Lymphadenopathy:     Cervical: No cervical adenopathy.  Skin:    General: Skin is warm.     Capillary Refill: Capillary refill takes less than 2 seconds.     Coloration: Skin is not pale.     Findings: No abrasion, erythema, petechiae or rash. Rash is not papular, urticarial or vesicular.     Comments: No eczematous or urticarial lesions noted.  Neurological:     Mental Status: She is alert.  Psychiatric:        Behavior: Behavior is cooperative.      Diagnostic studies:    Spirometry: results normal (FEV1: 2.33/89%, FVC: 2.52/73%, FEV1/FVC: 92%). Spirometry consistent with normal pattern.   Allergy Studies: none        Salvatore Marvel, MD  [facility]        /

## 2020-11-03 ENCOUNTER — Ambulatory Visit: Payer: 59 | Admitting: Allergy & Immunology

## 2020-11-11 ENCOUNTER — Telehealth (INDEPENDENT_AMBULATORY_CARE_PROVIDER_SITE_OTHER): Payer: Medicare Other | Admitting: Family

## 2020-11-11 ENCOUNTER — Other Ambulatory Visit: Payer: Self-pay

## 2020-11-11 VITALS — HR 107

## 2020-11-11 DIAGNOSIS — J209 Acute bronchitis, unspecified: Secondary | ICD-10-CM

## 2020-11-11 MED ORDER — AZITHROMYCIN 250 MG PO TABS: ORAL_TABLET | ORAL | 0 refills | Status: AC

## 2020-11-11 MED ORDER — PREDNISONE 10 MG PO TABS: ORAL_TABLET | ORAL | 0 refills | Status: DC

## 2020-11-11 NOTE — Progress Notes (Signed)
MyChart Video Visit    Virtual Visit via Video Note   This visit type was conducted due to national recommendations for restrictions regarding the COVID-19 Pandemic (e.g. social distancing) in an effort to limit this patient's exposure and mitigate transmission in our community. This patient is at least at moderate risk for complications without adequate follow up. This format is felt to be most appropriate for this patient at this time. Physical exam was limited by quality of the video and audio technology used for the visit. CMA was able to get the patient set up on a video visit.  Patient location: Home Patient and provider in visit Provider location: Office  I discussed the limitations of evaluation and management by telemedicine and the availability of in person appointments. The patient expressed understanding and agreed to proceed.  Visit Date: 11/11/2020  Today's healthcare provider: Nance Pear, NP     Subjective:    Patient ID: Dana Mccoy, female    DOB: 1955-07-08, 65 y.o.   MRN: [id number]  Chief Complaint  Patient presents with   Nasal Congestion    Complains of congestion and sinus pressure, tested negative for covid on 8-29 and 8-30    Cough    Patient complains of productive cough    Wheezing    Patient complains of wheezing and some SOB, she has asthma and has used her usual inhalers but not much help    Cough Associated symptoms include headaches and wheezing.  Wheezing  Associated symptoms include coughing and headaches.  Patient is in today for a video visit.  She complains of a cough, congestion, chest congestion, chest tightness, headache. She first had the cough Saturday then developed congestion, chest tightness, chest congestion, and headaches the next day. She then developed sinus pressure on Monday. She continues coughing at this time and has recently developed wheezing as well. She is using breo ellipta inhalers to manage  her symptoms. She has tested negative for Covid-19.    Past Medical History:  Diagnosis Date   Angio-edema    Asthma exacerbation, mild 02/12/2020   GERD (gastroesophageal reflux disease)    Hyperlipidemia    Hypertension    Reactive airway disease    post CAP    Past Surgical History:  Procedure Laterality Date   CARDIAC CATHETERIZATION  1997   negative   COLONOSCOPY  2007   Dr Cristina Gong   DILATION AND CURETTAGE OF UTERUS     UPPER GI ENDOSCOPY     X 2   WISDOM TOOTH EXTRACTION      Family History  Adopted: Yes  Problem Relation Age of Onset   Allergic rhinitis Mother    Asthma Neg Hx    Eczema Neg Hx    Urticaria Neg Hx     Social History   Socioeconomic History   Marital status: Single    Spouse name: Not on file   Number of children: Not on file   Years of education: Not on file   Highest education level: Bachelor's degree (e.g., BA, AB, BS)  Occupational History    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  Tobacco Use   Smoking status: Former    Types: Cigarettes    Quit date: 03/14/1984    Years since quitting: 36.6   Smokeless tobacco: Never   Tobacco comments:    smoked 1975-1986, up to 1.5 ppd  Vaping Use   Vaping Use: Never used  Substance and Sexual Activity   Alcohol use: No  Drug use: No   Sexual activity: Not on file  Other Topics Concern   Not on file  Social History Narrative   Exercise: 2-4 times a week   Social Determinants of Health   Financial Resource Strain: Not on file  Food Insecurity: Not on file  Transportation Needs: Not on file  Physical Activity: Not on file  Stress: Not on file  Social Connections: Not on file  Intimate Partner Violence: Not on file    Outpatient Medications Prior to Visit  Medication Sig Dispense Refill   acyclovir (ZOVIRAX) 400 MG tablet Take 1 tablet (400 mg total) by mouth 2 (two) times daily. 180 tablet 1   albuterol (VENTOLIN HFA) 108 (90 Base) MCG/ACT inhaler Inhale 1-2 puffs into the lungs every 4  (four) hours as needed for wheezing or shortness of breath. 18 g 1   benzonatate (TESSALON) 100 MG capsule Take 1 capsule (100 mg total) by mouth 2 (two) times daily as needed for cough. 20 capsule 0   COVID-19 At Home Antigen Test KIT Use as directed 4 kit 0   EPINEPHrine 0.3 mg/0.3 mL IJ SOAJ injection Inject 0.3 mg into the muscle as needed for anaphylaxis. 1 each 1   FLUoxetine (PROZAC) 20 MG capsule Take 1 capsule (20 mg total) by mouth daily. Take 20 mg by mouth daily. 90 capsule 0   fluticasone (VERAMYST) 27.5 MCG/SPRAY nasal spray Place 2 sprays into the nose daily.     fluticasone furoate-vilanterol (BREO ELLIPTA) 100-25 MCG/INH AEPB Inhale 1 puff into the lungs daily. 28 each 5   hydrochlorothiazide (MICROZIDE) 12.5 MG capsule TAKE 1 CAPSULE BY MOUTH ONCE DAILY 90 capsule 2   loratadine (CLARITIN) 10 MG tablet Take 1 tablet (10 mg total) by mouth 2 (two) times daily. 60 tablet 5   mometasone (ELOCON) 0.1 % cream mometasone 0.1 % topical cream  APPLY A THIN LAYER TO THE AFFECTED AREA OF THE SKIN DAILY     pantoprazole (PROTONIX) 40 MG tablet TAKE 1 TABLET BY MOUTH TWO TIMES DAILY 30 MIN BEFORE BREAKFAST AND DINNER 180 tablet 3   Probiotic Product (ALIGN PO) Take by mouth daily.     VITAMIN D, CHOLECALCIFEROL, PO Take 400 mg by mouth.     No facility-administered medications prior to visit.    Allergies  Allergen Reactions   Penicillins     RASH, amoxicillin   Lipitor [Atorvastatin]     Cognitive impact    Review of Systems  HENT:  Positive for congestion and sinus pain (pressure).   Respiratory:  Positive for cough and wheezing.        (+)Chest congestion (+)Chest tightness  Neurological:  Positive for headaches.      Objective:    Physical Exam Constitutional:      General: She is not in acute distress.    Appearance: Normal appearance. She is not ill-appearing.  HENT:     Head: Normocephalic and atraumatic.     Right Ear: External ear normal.     Left Ear:  External ear normal.  Neurological:     Mental Status: She is alert.  Psychiatric:        Behavior: Behavior normal.        Judgment: Judgment normal.    Pulse (!) 107   SpO2 96%  Wt Readings from Last 3 Encounters:  10/27/20 207 lb 6.4 oz (94.1 kg)  08/17/20 204 lb (92.5 kg)  06/15/20 194 lb (88 kg)    Diabetic Foot Exam -  Simple   No data filed    Lab Results  Component Value Date   WBC 10.2 11/10/2019   HGB 14.1 11/10/2019   HCT 44.6 11/10/2019   PLT 303 11/10/2019   GLUCOSE 104 (H) 11/10/2019   CHOL 240 (H) 09/06/2019   TRIG 235.0 (H) 09/06/2019   HDL 52.00 09/06/2019   LDLDIRECT 150.0 09/06/2019   LDLCALC 169 (H) 07/27/2018   ALT 21 09/06/2019   AST 28 09/06/2019   NA 138 11/10/2019   K 4.0 11/10/2019   CL 99 11/10/2019   CREATININE 0.89 11/10/2019   BUN 19 11/10/2019   CO2 28 11/10/2019   TSH 2.89 09/06/2019   HGBA1C 5.5 09/17/2015    Lab Results  Component Value Date   TSH 2.89 09/06/2019   Lab Results  Component Value Date   WBC 10.2 11/10/2019   HGB 14.1 11/10/2019   HCT 44.6 11/10/2019   MCV 91.0 11/10/2019   PLT 303 11/10/2019   Lab Results  Component Value Date   NA 138 11/10/2019   K 4.0 11/10/2019   CO2 28 11/10/2019   GLUCOSE 104 (H) 11/10/2019   BUN 19 11/10/2019   CREATININE 0.89 11/10/2019   BILITOT 0.4 09/06/2019   ALKPHOS 105 09/06/2019   AST 28 09/06/2019   ALT 21 09/06/2019   PROT 6.5 01/30/2020   ALBUMIN 4.3 09/06/2019   CALCIUM 9.6 11/10/2019   ANIONGAP 11 11/10/2019   GFR 58.46 (L) 09/06/2019   Lab Results  Component Value Date   CHOL 240 (H) 09/06/2019   Lab Results  Component Value Date   HDL 52.00 09/06/2019   Lab Results  Component Value Date   LDLCALC 169 (H) 07/27/2018   Lab Results  Component Value Date   TRIG 235.0 (H) 09/06/2019   Lab Results  Component Value Date   CHOLHDL 5 09/06/2019   Lab Results  Component Value Date   HGBA1C 5.5 09/17/2015       Assessment & Plan:   Problem  List Items Addressed This Visit       Unprioritized   Acute bronchitis with bronchospasm - Primary    New. Will rx with prednisone taper as below and Zpak course.  Pt is advised to use albuterol 2 puffs every 6 hours for the next few days and then as needed.  She is advised to go to the ER if severe/worsening symptoms and to let us know if she is not improved in 3-4 days. Pt verbalizes understanding.         Meds ordered this encounter  Medications   predniSONE (DELTASONE) 10 MG tablet    Sig: 4 tabs by mouth once daily for 2 days, then 3 tabs daily x 2 days, then 2 tabs daily x 2 days, then 1 tab daily x 2 days    Dispense:  20 tablet    Refill:  0    Order Specific Question:   Supervising Provider    Answer:   Penni Homans A [[id number]]   azithromycin (ZITHROMAX) 250 MG tablet    Sig: Take 2 tablets on day 1, then 1 tablet daily on days 2 through 5    Dispense:  6 tablet    Refill:  0    Order Specific Question:   Supervising Provider    Answer:   Penni Homans A [[id number]]    I discussed the assessment and treatment plan with the patient. The patient was provided an opportunity to ask questions and all were  answered. The patient agreed with the plan and demonstrated an understanding of the instructions.   The patient was advised to call back or seek an in-person evaluation if the symptoms worsen or if the condition fails to improve as anticipated.  I,Shehryar Multimedia programmer as a Education administrator for [employer], NP.,have documented all relevant documentation on the behalf of Nance Pear, NP,as directed by  Nance Pear, NP while in the presence of Nance Pear, NP.  I provided 20 minutes of face-to-face time during this encounter.   Nance Pear, NP [employer] [facility] [employer] [phone] (phone) [phone] (fax)  [place]

## 2020-11-11 NOTE — Assessment & Plan Note (Signed)
New. Will rx with prednisone taper as below and Zpak course.  Pt is advised to use albuterol 2 puffs every 6 hours for the next few days and then as needed.  She is advised to go to the ER if severe/worsening symptoms and to let us know if she is not improved in 3-4 days. Pt verbalizes understanding.

## 2020-11-13 ENCOUNTER — Telehealth: Payer: Self-pay | Admitting: Internal Medicine

## 2020-11-13 MED ORDER — HYDROCODONE BIT-HOMATROP MBR 5-1.5 MG/5ML PO SOLN: 5.0000 mL | Freq: Three times a day (TID) | ORAL | 0 refills | Status: DC | PRN

## 2020-11-13 NOTE — Telephone Encounter (Signed)
Patient called in after having VV w/ other provider 08.31.22  Wants to know if Dr. Quay Burow can send rx over to pharmacy for [facility] she has taking it before last December & it really helped  Says current med she is taking is not working as she is still coughing & wheezing   Please follow up w/ patient [phone]

## 2020-11-13 NOTE — Telephone Encounter (Signed)
Hycodan sent to pharmacy 

## 2020-11-16 ENCOUNTER — Encounter: Payer: Self-pay | Admitting: Internal Medicine

## 2020-11-18 ENCOUNTER — Emergency Department (HOSPITAL_BASED_OUTPATIENT_CLINIC_OR_DEPARTMENT_OTHER): Payer: Medicare Other

## 2020-11-18 ENCOUNTER — Other Ambulatory Visit: Payer: Self-pay

## 2020-11-18 ENCOUNTER — Emergency Department (HOSPITAL_BASED_OUTPATIENT_CLINIC_OR_DEPARTMENT_OTHER): Payer: Medicare Other | Admitting: Radiology

## 2020-11-18 ENCOUNTER — Telehealth: Payer: Self-pay | Admitting: Internal Medicine

## 2020-11-18 ENCOUNTER — Encounter (HOSPITAL_BASED_OUTPATIENT_CLINIC_OR_DEPARTMENT_OTHER): Payer: Self-pay | Admitting: *Deleted

## 2020-11-18 ENCOUNTER — Emergency Department (HOSPITAL_BASED_OUTPATIENT_CLINIC_OR_DEPARTMENT_OTHER)
Admission: EM | Admit: 2020-11-18 | Discharge: 2020-11-18 | Disposition: A | Discharge status: Home / Self Care | Payer: Medicare Other | Attending: Emergency Medicine | Admitting: Emergency Medicine

## 2020-11-18 DIAGNOSIS — R059 Cough, unspecified: Secondary | ICD-10-CM | POA: Diagnosis present

## 2020-11-18 DIAGNOSIS — I10 Essential (primary) hypertension: Secondary | ICD-10-CM | POA: Diagnosis not present

## 2020-11-18 DIAGNOSIS — R0600 Dyspnea, unspecified: Secondary | ICD-10-CM | POA: Diagnosis not present

## 2020-11-18 DIAGNOSIS — Z87891 Personal history of nicotine dependence: Secondary | ICD-10-CM | POA: Diagnosis not present

## 2020-11-18 DIAGNOSIS — J40 Bronchitis, not specified as acute or chronic: Secondary | ICD-10-CM | POA: Diagnosis not present

## 2020-11-18 DIAGNOSIS — Z7951 Long term (current) use of inhaled steroids: Secondary | ICD-10-CM | POA: Diagnosis not present

## 2020-11-18 LAB — CBC
HCT: 47.5 % — ABNORMAL HIGH (ref 36.0–46.0)
Hemoglobin: 15.2 g/dL — ABNORMAL HIGH (ref 12.0–15.0)
MCH: 27.8 pg (ref 26.0–34.0)
MCHC: 32 g/dL (ref 30.0–36.0)
MCV: 87 fL (ref 80.0–100.0)
Platelets: 253 10*3/uL (ref 150–400)
RBC: 5.46 MIL/uL — ABNORMAL HIGH (ref 3.87–5.11)
RDW: 13.2 % (ref 11.5–15.5)
WBC: 10.5 10*3/uL (ref 4.0–10.5)
nRBC: 0 % (ref 0.0–0.2)

## 2020-11-18 LAB — BASIC METABOLIC PANEL
Anion gap: 12 (ref 5–15)
BUN: 23 mg/dL (ref 8–23)
CO2: 27 mmol/L (ref 22–32)
Calcium: 10 mg/dL (ref 8.9–10.3)
Chloride: 100 mmol/L (ref 98–111)
Creatinine, Ser: 0.79 mg/dL (ref 0.44–1.00)
GFR, Estimated: >60 mL/min (ref >60)
Glucose, Bld: 81 mg/dL (ref 70–99)
Potassium: 3.7 mmol/L (ref 3.5–5.1)
Sodium: 139 mmol/L (ref 135–145)

## 2020-11-18 MED ORDER — IOHEXOL 350 MG/ML SOLN
75.0000 mL | Freq: Once | INTRAVENOUS | Status: AC | PRN
  Administered 2020-11-18: 75 mL via INTRAVENOUS

## 2020-11-18 MED ORDER — DEXTROSE 5 % IV BOLUS
500.0000 mL | Freq: Once | INTRAVENOUS | Status: AC
  Administered 2020-11-18: 500 mL via INTRAVENOUS

## 2020-11-18 MED ORDER — DOXYCYCLINE HYCLATE 100 MG PO CAPS: 100.0000 mg | ORAL_CAPSULE | Freq: Two times a day (BID) | ORAL | 0 refills | Status: DC

## 2020-11-18 MED ORDER — DOXYCYCLINE HYCLATE 100 MG PO TABS
100.0000 mg | ORAL_TABLET | Freq: Once | ORAL | Status: AC
  Administered 2020-11-18: 100 mg via ORAL
  Filled 2020-11-18: qty 1

## 2020-11-18 MED ORDER — ALBUTEROL SULFATE HFA 108 (90 BASE) MCG/ACT IN AERS: 2.0000 | INHALATION_SPRAY | RESPIRATORY_TRACT | Status: DC | PRN

## 2020-11-18 MED ORDER — ALBUTEROL SULFATE (2.5 MG/3ML) 0.083% IN NEBU
5.0000 mg | INHALATION_SOLUTION | Freq: Once | RESPIRATORY_TRACT | Status: AC
  Administered 2020-11-18: 5 mg via RESPIRATORY_TRACT
  Filled 2020-11-18: qty 6

## 2020-11-18 MED ORDER — METHYLPREDNISOLONE SODIUM SUCC 125 MG IJ SOLR
125.0000 mg | Freq: Once | INTRAMUSCULAR | Status: AC
  Administered 2020-11-18: 125 mg via INTRAVENOUS
  Filled 2020-11-18: qty 2

## 2020-11-18 NOTE — ED Provider Notes (Signed)
[facility] EMERGENCY DEPT Provider Note   CSN: [id number] Arrival date & time: 11/18/20  1206     History Chief Complaint  Patient presents with   Shortness of Breath    Dana Mccoy is a 65 y.o. female.  HPI 65 yo female complaining cough and congestion, began with cough, productive whitish sputum.  Felt like sinius infection with clear discharge, pain in face with temp of 100.  Had telehealth visit with [facility] and was prescribed z-pack and prednisone.  She used perles and had hydrocodone called in.  She has had to sit up to sleep well.  She has been wheezing and using albuterol without relief q 4-6 while awake and on Breo.  She has had increasing dyspnea since last Friday with any exertion.  Denies history dvt or pe, no swelling or immobilization.  She has had 2 vaccines for covid and a booster.  2 negative pcr tests last week.     Past Medical History:  Diagnosis Date   Angio-edema    Asthma exacerbation, mild 02/12/2020   GERD (gastroesophageal reflux disease)    Hyperlipidemia    Hypertension    Reactive airway disease    post CAP    Patient Active Problem List   Diagnosis Date Noted   Acute bronchitis with bronchospasm 11/11/2020   Acute cystitis 08/17/2020   Asthma exacerbation, mild 02/12/2020   Adverse food reaction 01/30/2020   Angio-edema 01/17/2020   Frequent headaches 01/17/2020   DDD (degenerative disc disease), cervical 09/24/2019   Numbness and tingling 09/06/2019   Low iron 09/06/2019   Tremor 09/06/2019   Restless leg syndrome 07/27/2018   Chronic pain of left knee 04/17/2018   Right low back pain 04/17/2018   Fatigue 08/21/2017   Bradycardia 08/21/2017   Atypical chest pain 08/21/2017   Anxiety 12/08/2013   Obesity (BMI 30-39.9) 08/02/2013   Other allergic rhinitis 08/02/2013   Acute bronchitis 04/02/2013   Cervical stenosis of spine 04/26/2011   Hyperlipidemia 06/05/2007   Reactive airway disease 06/05/2007    Gastroesophageal reflux disease 06/05/2007    Past Surgical History:  Procedure Laterality Date   [place]   negative   COLONOSCOPY  2007   Dr Buccini   DILATION AND CURETTAGE OF UTERUS     UPPER GI ENDOSCOPY     X 2   WISDOM TOOTH EXTRACTION       OB History   No obstetric history on file.     Family History  Adopted: Yes  Problem Relation Age of Onset   Allergic rhinitis Mother    Asthma Neg Hx    Eczema Neg Hx    Urticaria Neg Hx     Social History   Tobacco Use   Smoking status: Former    Types: Cigarettes    Quit date: 03/14/1984    Years since quitting: 36.7   Smokeless tobacco: Never   Tobacco comments:    smoked 1975-1986, up to 1.5 ppd  Vaping Use   Vaping Use: Never used  Substance Use Topics   Alcohol use: No    Comment: very rarely   Drug use: No    Home Medications Prior to Admission medications   Medication Sig Start Date End Date Taking? Authorizing Provider  acyclovir (ZOVIRAX) 400 MG tablet Take 1 tablet (400 mg total) by mouth 2 (two) times daily. 09/16/20   Binnie Rail, MD  albuterol (VENTOLIN HFA) 108 (90 Base) MCG/ACT inhaler Inhale 1-2 puffs into the  lungs every 4 (four) hours as needed for wheezing or shortness of breath. 10/27/20   Valentina Shaggy, MD  benzonatate (TESSALON) 100 MG capsule Take 1 capsule (100 mg total) by mouth 2 (two) times daily as needed for cough. 09/11/20   Billie Ruddy, MD  COVID-19 At Home Antigen Test KIT Use as directed 09/03/20   Margie Ege, [facility]  EPINEPHrine 0.3 mg/0.3 mL IJ SOAJ injection Inject 0.3 mg into the muscle as needed for anaphylaxis. 10/27/20 10/27/21  Valentina Shaggy, MD  FLUoxetine (PROZAC) 20 MG capsule Take 1 capsule (20 mg total) by mouth daily. Take 20 mg by mouth daily. 09/16/20   Binnie Rail, MD  fluticasone (VERAMYST) 27.5 MCG/SPRAY nasal spray Place 2 sprays into the nose daily.    [provider]  fluticasone furoate-vilanterol (BREO ELLIPTA)  100-25 MCG/INH AEPB Inhale 1 puff into the lungs daily. 10/27/20   Valentina Shaggy, MD  hydrochlorothiazide (MICROZIDE) 12.5 MG capsule TAKE 1 CAPSULE BY MOUTH ONCE DAILY 07/21/20 07/21/21  Binnie Rail, MD  HYDROcodone bit-homatropine (HYCODAN) 5-1.5 MG/5ML syrup Take 5 mLs by mouth every 8 (eight) hours as needed for cough. 11/13/20   Binnie Rail, MD  loratadine (CLARITIN) 10 MG tablet Take 1 tablet (10 mg total) by mouth 2 (two) times daily. 10/27/20   Valentina Shaggy, MD  mometasone (ELOCON) 0.1 % cream mometasone 0.1 % topical cream  APPLY A THIN LAYER TO THE AFFECTED AREA OF THE SKIN DAILY    [provider]  pantoprazole (PROTONIX) 40 MG tablet TAKE 1 TABLET BY MOUTH TWO TIMES DAILY 30 MIN BEFORE BREAKFAST AND DINNER 01/24/20 01/23/21  Buccini, Herbie [place], MD  predniSONE (DELTASONE) 10 MG tablet 4 tabs by mouth once daily for 2 days, then 3 tabs daily x 2 days, then 2 tabs daily x 2 days, then 1 tab daily x 2 days 11/11/20   Debbrah Alar, NP  Probiotic Product (ALIGN PO) Take by mouth daily.    [provider]  VITAMIN D, CHOLECALCIFEROL, PO Take 400 mg by mouth.    [provider]  omeprazole (PRILOSEC) 40 MG capsule TAKE 1 CAPSULE BY MOUTH ONCE DAILY 10/08/19 01/10/20  Binnie Rail, MD    Allergies    Penicillins and Lipitor [atorvastatin]  Review of Systems   Review of Systems  Physical Exam Updated Vital Signs BP 136/72   Pulse 71   Temp 97.9 F (36.6 C) (Oral)   Resp 20   Ht 1.651 m (_0 )   Wt 91.2 kg   SpO2 95%   BMI 33.45 kg/m   Physical Exam Vitals and nursing note reviewed.  Constitutional:      Appearance: She is well-developed.  HENT:     Head: Normocephalic and atraumatic.     Right Ear: External ear normal.     Left Ear: External ear normal.     Nose: Nose normal.  Eyes:     Conjunctiva/sclera: Conjunctivae normal.     Pupils: Pupils are equal, round, and reactive to light.  Neck:     Thyroid: No thyromegaly.      Vascular: No JVD.     Trachea: No tracheal deviation.  Cardiovascular:     Rate and Rhythm: Normal rate and regular rhythm.     Heart sounds: Normal heart sounds.  Pulmonary:     Effort: Pulmonary effort is normal.     Breath sounds: Examination of the right-upper field reveals wheezing. Examination of the left-upper  field reveals wheezing. Examination of the right-middle field reveals wheezing. Examination of the left-middle field reveals wheezing. Wheezing present.  Abdominal:     General: Bowel sounds are normal.     Palpations: Abdomen is soft. There is no mass.     Tenderness: There is no abdominal tenderness. There is no guarding.  Musculoskeletal:        General: Normal range of motion.     Cervical back: Normal range of motion and neck supple.  Lymphadenopathy:     Cervical: No cervical adenopathy.  Skin:    General: Skin is warm and dry.  Neurological:     Mental Status: She is alert and oriented to person, place, and time.     GCS: GCS eye subscore is 4. GCS verbal subscore is 5. GCS motor subscore is 6.     Cranial Nerves: No cranial nerve deficit.     Sensory: No sensory deficit.     Gait: Gait normal.     Deep Tendon Reflexes: Reflexes are normal and symmetric. Babinski sign absent on the right side. Babinski sign absent on the left side.     Reflex Scores:      Bicep reflexes are 2+ on the right side and 2+ on the left side.      Patellar reflexes are 2+ on the right side and 2+ on the left side.    Comments: Strength is normal and equal throughout. Cranial nerves grossly intact. Patient fluent. No gross ataxia and patient able to ambulate without difficulty.  Psychiatric:        Behavior: Behavior normal.        Thought Content: Thought content normal.        Judgment: Judgment normal.    ED Results / Procedures / Treatments   Labs (all labs ordered are listed, but only abnormal results are displayed) Labs Reviewed  CBC - Abnormal; Notable for the  following components:      Result Value   RBC 5.46 (*)    Hemoglobin 15.2 (*)    HCT 47.5 (*)    All other components within normal limits  BASIC METABOLIC PANEL    EKG EKG Interpretation  Date/Time:  Wednesday November 18 2020 16:17:06 EDT Ventricular Rate:  69 PR Interval:  152 QRS Duration: 82 QT Interval:  362 QTC Calculation: 387 R Axis:   60 Text Interpretation: Normal sinus rhythm Normal ECG Confirmed by Pattricia Boss [phone]) on 11/18/2020 5:41:16 PM  Radiology DG Chest 2 View  Result Date: 11/18/2020 CLINICAL DATA:  Cough, diagnosed with bronchitis EXAM: CHEST - 2 VIEW COMPARISON:  Chest radiograph 11/10/2019 FINDINGS: The cardiomediastinal silhouette is normal. Linear opacities in the left base likely reflect subsegmental atelectasis. There is no other focal consolidation. There is no pulmonary edema. There is no pleural effusion or pneumothorax. There is no acute osseous abnormality. IMPRESSION: Left basilar subsegmental atelectasis. Otherwise, clear lungs with no radiographic evidence of acute cardiopulmonary process. Electronically Signed   By: Valetta Mole M.D.   On: 11/18/2020 13:29   CT Angio Chest PE W and/or Wo Contrast  Result Date: 11/18/2020 CLINICAL DATA:  Chest pain or shortness of breath, pleurisy or effusion suspected EXAM: CT ANGIOGRAPHY CHEST WITH CONTRAST TECHNIQUE: Multidetector CT imaging of the chest was performed using the standard protocol during bolus administration of intravenous contrast. Multiplanar CT image reconstructions and MIPs were obtained to evaluate the vascular anatomy. CONTRAST:  33m OMNIPAQUE IOHEXOL 350 MG/ML SOLN COMPARISON:  None. FINDINGS: Cardiovascular: Satisfactory opacification  of the pulmonary arteries to the segmental level. No evidence of pulmonary embolism. Normal heart size. No pericardial effusion. Mediastinum/Nodes: No enlarged lymph nodes. Thyroid and esophagus are unremarkable. Lungs/Pleura: Minimal subsegmental  atelectasis/scarring at the lung bases. Mild peribronchial thickening. No pleural effusion. Upper Abdomen: No acute abnormality. Musculoskeletal: No acute osseous abnormality. Review of the MIP images confirms the above findings. IMPRESSION: No acute pulmonary embolism. Mild peribronchial thickening may reflect bronchitis. Electronically Signed   By: Macy Mis M.D.   On: 11/18/2020 17:54    Procedures Procedures   Medications Ordered in ED Medications  albuterol (VENTOLIN HFA) 108 (90 Base) MCG/ACT inhaler 2 puff (has no administration in time range)  dextrose 5 % bolus 500 mL (500 mLs Intravenous New Bag/Given 11/18/20 1610)  methylPREDNISolone sodium succinate (SOLU-MEDROL) 125 mg/2 mL injection 125 mg (125 mg Intravenous Given 11/18/20 1558)  albuterol (PROVENTIL) (2.5 MG/3ML) 0.083% nebulizer solution 5 mg (5 mg Nebulization Given 11/18/20 1543)  doxycycline (VIBRA-TABS) tablet 100 mg (100 mg Oral Given 11/18/20 1558)  iohexol (OMNIPAQUE) 350 MG/ML injection 75 mL (75 mLs Intravenous Contrast Given 11/18/20 1706)    ED Course  I have reviewed the triage vital signs and the nursing notes.  Pertinent labs & imaging results that were available during my care of the patient were reviewed by me and considered in my medical decision making (see chart for details).    MDM Rules/Calculators/A&P                           Patient with cough, dyspnea, history of fever and chills and asthma.  Chest x-Ninah Moccio without acute abnormality.  CT obtained shows no evidence of PE but does have some bronchial thickening consistent with bronchitis.  She is some upper airway wheezing but seem to move air well throughout her lower airways.  She received Solu-Medrol, Doxy, and albuterol here in the ED.  She has been on some prednisone at home but does not like she has been taking consistent dose.  Plan to use albuterol, prednisone and will prescribe Doxy.  She was previously teetered treated with Zithromax Final Clinical  Impression(s) / ED Diagnoses Final diagnoses:  Bronchitis    Rx / DC Orders ED Discharge Orders     None        Pattricia Boss, MD 11/19/20 1656

## 2020-11-18 NOTE — Telephone Encounter (Signed)
[facility] FYI  Caller states, patient was treated at Urgent Care for bronchitis. Now having chest tightness, shortness of breath. Has appt. with office on Friday. May need a chest X-Ray.   Advised to PCP within 24 hrs. Patient decided to go to [facility] or ED instead.

## 2020-11-18 NOTE — Telephone Encounter (Signed)
Noted  

## 2020-11-18 NOTE — Discharge Instructions (Addendum)
Please continue albuterol 2 puffs every 4 hour.  Please continue your prednisone until you have completed the course Take doxycycline until course is complete There is no evidence of blood clots or definite pneumonia seen on your CT scan. Return if you are getting worse at any time or need reevaluation

## 2020-11-18 NOTE — ED Triage Notes (Addendum)
Patient was diagnosed with bronchitis this past Wednesday.  Was placed on Prednisone (still taking) and Z-pack (completed).  Pt stated she did not have any relief with the medicine.  Was tested for Covid x 2 with negative results.

## 2020-11-20 ENCOUNTER — Other Ambulatory Visit: Payer: Self-pay

## 2020-11-20 ENCOUNTER — Ambulatory Visit (INDEPENDENT_AMBULATORY_CARE_PROVIDER_SITE_OTHER): Payer: Medicare Other | Admitting: Internal Medicine

## 2020-11-20 ENCOUNTER — Encounter: Payer: Self-pay | Admitting: Internal Medicine

## 2020-11-20 VITALS — BP 120/80 | HR 66 | Temp 98.2°F | Ht 65.0 in | Wt 203.0 lb

## 2020-11-20 DIAGNOSIS — J209 Acute bronchitis, unspecified: Secondary | ICD-10-CM

## 2020-11-20 MED ORDER — METHYLPREDNISOLONE ACETATE 80 MG/ML IJ SUSP
80.0000 mg | Freq: Once | INTRAMUSCULAR | Status: AC
  Administered 2020-11-20: 80 mg via INTRAMUSCULAR

## 2020-11-20 MED ORDER — METHYLPREDNISOLONE ACETATE 40 MG/ML IJ SUSP
40.0000 mg | Freq: Once | INTRAMUSCULAR | Status: AC
  Administered 2020-11-20: 40 mg via INTRAMUSCULAR

## 2020-11-20 NOTE — Patient Instructions (Addendum)
  A steroid injection was given.    Medications changes include :  none - continue your current medications

## 2020-11-20 NOTE — Progress Notes (Signed)
Subjective:    Patient ID: Dana Mccoy, female    DOB: Apr 13, 1955, 65 y.o.   MRN: [id number]  This visit occurred during the SARS-CoV-2 public health emergency.  Safety protocols were in place, including screening questions prior to the visit, additional usage of staff PPE, and extensive cleaning of exam room while observing appropriate contact time as indicated for disinfecting solutions.    HPI The patient is here for an acute visit.  Just over two weeks ago started with deep cough.  Got sinus symptoms.  Saw NP - zpak, prednisone.  Finished abx and had to space out prednisone due to stomach upset - just finished it now.  She had difficulty walking due to SOB and had cough.   Went to ED on 9/7 - CT chest showed bronchitis.  Given solu-medrol 125 mg  x 1, which helped.  She was prescribed doxycycline.  She has been using her albuterol.  She is doing the breo.  The past couple of days she feels a little better - she was able to sleep last night.   Medications and allergies reviewed with patient and updated if appropriate.  Patient Active Problem List   Diagnosis Date Noted   Acute bronchitis with bronchospasm 11/11/2020   Acute cystitis 08/17/2020   Asthma exacerbation, mild 02/12/2020   Adverse food reaction 01/30/2020   Angio-edema 01/17/2020   Frequent headaches 01/17/2020   DDD (degenerative disc disease), cervical 09/24/2019   Numbness and tingling 09/06/2019   Low iron 09/06/2019   Tremor 09/06/2019   Restless leg syndrome 07/27/2018   Chronic pain of left knee 04/17/2018   Right low back pain 04/17/2018   Fatigue 08/21/2017   Bradycardia 08/21/2017   Atypical chest pain 08/21/2017   Anxiety 12/08/2013   Obesity (BMI 30-39.9) 08/02/2013   Other allergic rhinitis 08/02/2013   Acute bronchitis 04/02/2013   Cervical stenosis of spine 04/26/2011   Hyperlipidemia 06/05/2007   Reactive airway disease 06/05/2007   Gastroesophageal reflux disease 06/05/2007     Current Outpatient Medications on File Prior to Visit  Medication Sig Dispense Refill   acyclovir (ZOVIRAX) 400 MG tablet Take 1 tablet (400 mg total) by mouth 2 (two) times daily. 180 tablet 1   albuterol (VENTOLIN HFA) 108 (90 Base) MCG/ACT inhaler Inhale 1-2 puffs into the lungs every 4 (four) hours as needed for wheezing or shortness of breath. 18 g 1   benzonatate (TESSALON) 100 MG capsule Take 1 capsule (100 mg total) by mouth 2 (two) times daily as needed for cough. 20 capsule 0   COVID-19 At Home Antigen Test KIT Use as directed 4 kit 0   doxycycline (VIBRAMYCIN) 100 MG capsule Take 1 capsule (100 mg total) by mouth 2 (two) times daily. 20 capsule 0   EPINEPHrine 0.3 mg/0.3 mL IJ SOAJ injection Inject 0.3 mg into the muscle as needed for anaphylaxis. 1 each 1   FLUoxetine (PROZAC) 20 MG capsule Take 1 capsule (20 mg total) by mouth daily. Take 20 mg by mouth daily. 90 capsule 0   fluticasone (VERAMYST) 27.5 MCG/SPRAY nasal spray Place 2 sprays into the nose daily.     fluticasone furoate-vilanterol (BREO ELLIPTA) 100-25 MCG/INH AEPB Inhale 1 puff into the lungs daily. 28 each 5   hydrochlorothiazide (MICROZIDE) 12.5 MG capsule TAKE 1 CAPSULE BY MOUTH ONCE DAILY 90 capsule 2   HYDROcodone bit-homatropine (HYCODAN) 5-1.5 MG/5ML syrup Take 5 mLs by mouth every 8 (eight) hours as needed for cough. 120 mL 0  loratadine (CLARITIN) 10 MG tablet Take 1 tablet (10 mg total) by mouth 2 (two) times daily. 60 tablet 5   mometasone (ELOCON) 0.1 % cream mometasone 0.1 % topical cream  APPLY A THIN LAYER TO THE AFFECTED AREA OF THE SKIN DAILY     pantoprazole (PROTONIX) 40 MG tablet TAKE 1 TABLET BY MOUTH TWO TIMES DAILY 30 MIN BEFORE BREAKFAST AND DINNER 180 tablet 3   predniSONE (DELTASONE) 10 MG tablet 4 tabs by mouth once daily for 2 days, then 3 tabs daily x 2 days, then 2 tabs daily x 2 days, then 1 tab daily x 2 days 20 tablet 0   VITAMIN D, CHOLECALCIFEROL, PO Take 400 mg by mouth.      [DISCONTINUED] omeprazole (PRILOSEC) 40 MG capsule TAKE 1 CAPSULE BY MOUTH ONCE DAILY 90 capsule 0   No current facility-administered medications on file prior to visit.    Past Medical History:  Diagnosis Date   Angio-edema    Asthma exacerbation, mild 02/12/2020   GERD (gastroesophageal reflux disease)    Hyperlipidemia    Hypertension    Reactive airway disease    post CAP    Past Surgical History:  Procedure Laterality Date   CARDIAC CATHETERIZATION  1997   negative   COLONOSCOPY  2007   Dr Cristina Gong   DILATION AND CURETTAGE OF UTERUS     UPPER GI ENDOSCOPY     X 2   WISDOM TOOTH EXTRACTION      Social History   Socioeconomic History   Marital status: Single    Spouse name: Not on file   Number of children: Not on file   Years of education: Not on file   Highest education level: Bachelor's degree (e.g., BA, AB, BS)  Occupational History    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  Tobacco Use   Smoking status: Former    Types: Cigarettes    Quit date: 03/14/1984    Years since quitting: 36.7   Smokeless tobacco: Never   Tobacco comments:    smoked 1975-1986, up to 1.5 ppd  Vaping Use   Vaping Use: Never used  Substance and Sexual Activity   Alcohol use: No    Comment: very rarely   Drug use: No   Sexual activity: Not on file  Other Topics Concern   Not on file  Social History Narrative   Exercise: 2-4 times a week   Social Determinants of Health   Financial Resource Strain: Not on file  Food Insecurity: Not on file  Transportation Needs: Not on file  Physical Activity: Not on file  Stress: Not on file  Social Connections: Not on file    Family History  Adopted: Yes  Problem Relation Age of Onset   Allergic rhinitis Mother    Asthma Neg Hx    Eczema Neg Hx    Urticaria Neg Hx     Review of Systems  Constitutional:  Negative for chills and fever.  HENT:  Positive for postnasal drip. Negative for ear pain, sinus pain and sore throat.   Respiratory:   Positive for cough, shortness of breath and wheezing (upper airway - improved). Negative for chest tightness.   Neurological:  Positive for headaches.      Objective:   Vitals:   11/20/20 1349  BP: 120/80  Pulse: 66  Temp: 98.2 F (36.8 C)  SpO2: 96%   BP Readings from Last 3 Encounters:  11/20/20 120/80  11/18/20 (!) 156/90  10/27/20 132/80  Wt Readings from Last 3 Encounters:  11/20/20 203 lb (92.1 kg)  11/18/20 201 lb (91.2 kg)  10/27/20 207 lb 6.4 oz (94.1 kg)   Body mass index is 33.78 kg/m.   Physical Exam    Constitutional: Appears well-developed and well-nourished. No distress.  Head: Normocephalic and atraumatic.  Neck: Neck supple. No tracheal deviation present. No thyromegaly present.  No cervical lymphadenopathy Cardiovascular: Normal rate, regular rhythm and normal heart sounds.   No edema Pulmonary/Chest: Effort normal. No respiratory distress. Diffuse expiratory wheeze. No rales.  Skin: Skin is warm and dry. Not diaphoretic.        Assessment & Plan:    See Problem List for Assessment and Plan of chronic medical problems.

## 2020-11-21 NOTE — Assessment & Plan Note (Signed)
Acute Has taken zpak prednisone taper - which she took over a long period of time because it caused stomach upset-no significant improvement Went to ED-CT of chest showed bronchitis-taking doxycycline and given a dose of Solu-Medrol Slowly getting better, but diffuse wheeze on exam Continue and finish doxycycline Depo-Medrol 120 mg IM today-she does not tolerate oral steroids well Continue Breo, albuterol inhaler as needed and over-the-counter cold medications She will call if she is not improving

## 2020-11-24 ENCOUNTER — Encounter: Payer: Self-pay | Admitting: Internal Medicine

## 2020-11-29 ENCOUNTER — Encounter: Payer: Self-pay | Admitting: Internal Medicine

## 2020-12-01 NOTE — Telephone Encounter (Signed)
Pt has OV with Dr. Mitchel Honour 12/02/20.

## 2020-12-02 ENCOUNTER — Ambulatory Visit (INDEPENDENT_AMBULATORY_CARE_PROVIDER_SITE_OTHER): Payer: Medicare Other

## 2020-12-02 ENCOUNTER — Other Ambulatory Visit: Payer: Self-pay

## 2020-12-02 ENCOUNTER — Encounter: Payer: Self-pay | Admitting: Emergency Medicine

## 2020-12-02 ENCOUNTER — Ambulatory Visit (INDEPENDENT_AMBULATORY_CARE_PROVIDER_SITE_OTHER): Payer: Medicare Other | Admitting: Emergency Medicine

## 2020-12-02 VITALS — BP 122/70 | HR 74 | Temp 98.1°F | Ht 65.0 in | Wt 202.0 lb

## 2020-12-02 DIAGNOSIS — R059 Cough, unspecified: Secondary | ICD-10-CM | POA: Diagnosis not present

## 2020-12-02 DIAGNOSIS — J40 Bronchitis, not specified as acute or chronic: Secondary | ICD-10-CM

## 2020-12-02 DIAGNOSIS — B370 Candidal stomatitis: Secondary | ICD-10-CM

## 2020-12-02 MED ORDER — FLUCONAZOLE 150 MG PO TABS: 150.0000 mg | ORAL_TABLET | Freq: Once | ORAL | 0 refills | Status: AC

## 2020-12-02 MED ORDER — NYSTATIN 100000 UNIT/ML MT SUSP: 5.0000 mL | Freq: Four times a day (QID) | OROMUCOSAL | 1 refills | Status: AC

## 2020-12-02 NOTE — Patient Instructions (Signed)
Oral Thrush, Adult Oral thrush is an infection in your mouth and throat and on your tongue. It causes white patches to form in your mouth and on your tongue. Many cases of thrush are mild. But, sometimes, thrush can be serious. People who have a weak body defense system (immune system) or other diseases can be affected more. What are the causes? This condition is caused by a type of fungus called yeast. The fungus is normally present in small amounts in the mouth and nose. If a person has a long-term illness or a weak body defense system, the fungus can grow and spread quickly. This causes thrush. What increases the risk? You are more likely to develop this condition if: You have a weak body defense system. You are an older adult. You have diabetes, cancer, or HIV. You have a dry mouth. You are pregnant or breastfeeding. You do not take good care of your teeth. This risk is greater for people who have false teeth (dentures). You use antibiotic or steroid medicines. What are the signs or symptoms? Symptoms of this condition include: A burning feeling in the mouth and throat. White patches that stick to the mouth and tongue. A bad taste in the mouth or trouble tasting foods. A feeling like you have cotton in your mouth. Pain when you eat and swallow. Not wanting to eat as much as usual. Cracking at the corners of the mouth. How is this treated? This condition is treated with medicines called antifungals. These medicines prevent a fungus from growing. The medicines are either put right on the area (topical) or swallowed (oral). Your doctor will also treat other problems that you may have, such as diabetes or HIV. Follow these instructions at home: Medicines Take or use over-the-counter and prescription medicines only as told by your doctor. Ask your doctor about an over-the-counter medicine called gentian violet. Helping with pain and soreness To lessen your pain: Drink cold liquids,  like water and iced tea. Eat frozen ice pops or frozen juices. Eat foods that are easy to swallow, like gelatin and ice cream. Drink from a straw if you have too much pain in your mouth.  General instructions Eat plain yogurt that has live cultures in it. Read the label to make sure that there are live cultures in your yogurt. If you wear false teeth: Take them out before you go to bed. Brush them well. Soak them in a cleaner. Rinse your mouth with warm salt-water many times a day. To make the salt-water mixture, dissolve -1 teaspoon (3-6 g) of salt in 1 cup (237 mL) of warm water. Contact a doctor if: Your problems do not get better within 7 days of treatment. Your infection is spreading. This may show as white areas on the skin outside of your mouth. You are nursing your baby and you have redness and pain in the nipples. Summary Oral thrush is an infection in your mouth and throat. It is caused by a fungus. You are more likely to get this condition if you have a weak body defense system. Diseases like diabetes, cancer, or HIV also add to your risk. This condition is treated with medicines called antifungals. Contact a doctor if you do not get better within 7 days of starting treatment. This information is not intended to replace advice given to you by your health care provider. Make sure you discuss any questions you have with your health care provider. Document Revised: 01/04/2019 Document Reviewed: 01/04/2019 Elsevier Patient Education  2022 [employer] Inc[employer]  

## 2020-12-02 NOTE — Progress Notes (Signed)
Dana Mccoy 65 y.o.   Chief Complaint  Patient presents with   Thrush    Pt states she has dry mouth, white spots in mouth and taste of food is bad.   Shortness of Breath    X 2 wks, pt previous had a cough and she states that she feels fatigue more often    HISTORY OF PRESENT ILLNESS: Visit today for acute problem. This is a 65 y.o. female complaining of possible thrush in her mouth. Has had acute bronchitis that started about 4 weeks ago.  Went to the emergency room where she was kept for about 6 hours and given IV antibiotics.  COVID test has been negative.  Took antibiotics as well as corticosteroids. No history of diabetes. Bronchitis feels about 50% better.  Still has some residual cough.  Some difficulty breathing on exertion and still feeling fatigued at times. No other complaints or medical concerns today.     Prior to Admission medications   Medication Sig Start Date End Date Taking? Authorizing Provider  acyclovir (ZOVIRAX) 400 MG tablet Take 1 tablet (400 mg total) by mouth 2 (two) times daily. 09/16/20  Yes Burns, Claudina Lick, MD  albuterol (VENTOLIN HFA) 108 (90 Base) MCG/ACT inhaler Inhale 1-2 puffs into the lungs every 4 (four) hours as needed for wheezing or shortness of breath. 10/27/20  Yes Valentina Shaggy, MD  COVID-19 At Home Antigen Test KIT Use as directed 09/03/20  Yes Margie Ege, [facility]  EPINEPHrine 0.3 mg/0.3 mL IJ SOAJ injection Inject 0.3 mg into the muscle as needed for anaphylaxis. 10/27/20 10/27/21 Yes Valentina Shaggy, MD  FLUoxetine (PROZAC) 20 MG capsule Take 1 capsule (20 mg total) by mouth daily. Take 20 mg by mouth daily. 09/16/20  Yes Burns, Claudina Lick, MD  fluticasone (VERAMYST) 27.5 MCG/SPRAY nasal spray Place 2 sprays into the nose daily.   Yes [provider]  fluticasone furoate-vilanterol (BREO ELLIPTA) 100-25 MCG/INH AEPB Inhale 1 puff into the lungs daily. 10/27/20  Yes Valentina Shaggy, MD  hydrochlorothiazide  (MICROZIDE) 12.5 MG capsule TAKE 1 CAPSULE BY MOUTH ONCE DAILY 07/21/20 07/21/21 Yes Burns, Claudina Lick, MD  HYDROcodone bit-homatropine (HYCODAN) 5-1.5 MG/5ML syrup Take 5 mLs by mouth every 8 (eight) hours as needed for cough. 11/13/20  Yes Burns, Claudina Lick, MD  loratadine (CLARITIN) 10 MG tablet Take 1 tablet (10 mg total) by mouth 2 (two) times daily. 10/27/20  Yes Valentina Shaggy, MD  mometasone (ELOCON) 0.1 % cream mometasone 0.1 % topical cream  APPLY A THIN LAYER TO THE AFFECTED AREA OF THE SKIN DAILY   Yes [provider]  pantoprazole (PROTONIX) 40 MG tablet TAKE 1 TABLET BY MOUTH TWO TIMES DAILY 30 MIN BEFORE BREAKFAST AND DINNER 01/24/20 01/23/21 Yes Buccini, Robert, MD  VITAMIN D, CHOLECALCIFEROL, PO Take 400 mg by mouth.   Yes [provider]  benzonatate (TESSALON) 100 MG capsule Take 1 capsule (100 mg total) by mouth 2 (two) times daily as needed for cough. Patient not taking: Reported on 12/02/2020 09/11/20   Billie Ruddy, MD  doxycycline (VIBRAMYCIN) 100 MG capsule Take 1 capsule (100 mg total) by mouth 2 (two) times daily. Patient not taking: Reported on 12/02/2020 11/18/20   Pattricia Boss, MD  omeprazole (PRILOSEC) 40 MG capsule TAKE 1 CAPSULE BY MOUTH ONCE DAILY 10/08/19 01/10/20  Binnie Rail, MD    Allergies  Allergen Reactions   Penicillins     RASH, amoxicillin   Lipitor [Atorvastatin]  Cognitive impact    Patient Active Problem List   Diagnosis Date Noted   Acute bronchitis with bronchospasm 11/11/2020   Acute cystitis 08/17/2020   Asthma exacerbation, mild 02/12/2020   Adverse food reaction 01/30/2020   Angio-edema 01/17/2020   Frequent headaches 01/17/2020   DDD (degenerative disc disease), cervical 09/24/2019   Numbness and tingling 09/06/2019   Low iron 09/06/2019   Tremor 09/06/2019   Restless leg syndrome 07/27/2018   Chronic pain of left knee 04/17/2018   Right low back pain 04/17/2018   Fatigue 08/21/2017   Bradycardia 08/21/2017    Atypical chest pain 08/21/2017   Anxiety 12/08/2013   Obesity (BMI 30-39.9) 08/02/2013   Other allergic rhinitis 08/02/2013   Acute bronchitis 04/02/2013   Cervical stenosis of spine 04/26/2011   Hyperlipidemia 06/05/2007   Reactive airway disease 06/05/2007   Gastroesophageal reflux disease 06/05/2007    Past Medical History:  Diagnosis Date   Angio-edema    Asthma exacerbation, mild 02/12/2020   GERD (gastroesophageal reflux disease)    Hyperlipidemia    Hypertension    Reactive airway disease    post CAP    Past Surgical History:  Procedure Laterality Date   [place]   negative   COLONOSCOPY  2007   Dr Cristina Gong   DILATION AND CURETTAGE OF UTERUS     UPPER GI ENDOSCOPY     X 2   WISDOM TOOTH EXTRACTION      Social History   Socioeconomic History   Marital status: Single    Spouse name: Not on file   Number of children: Not on file   Years of education: Not on file   Highest education level: Bachelor's degree (e.g., BA, AB, BS)  Occupational History    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  Tobacco Use   Smoking status: Former    Types: Cigarettes    Quit date: 03/14/1984    Years since quitting: 36.7   Smokeless tobacco: Never   Tobacco comments:    smoked 1975-1986, up to 1.5 ppd  Vaping Use   Vaping Use: Never used  Substance and Sexual Activity   Alcohol use: No    Comment: very rarely   Drug use: No   Sexual activity: Not on file  Other Topics Concern   Not on file  Social History Narrative   Exercise: 2-4 times a week   Social Determinants of Health   Financial Resource Strain: Not on file  Food Insecurity: Not on file  Transportation Needs: Not on file  Physical Activity: Not on file  Stress: Not on file  Social Connections: Not on file  Intimate Partner Violence: Not on file    Family History  Adopted: Yes  Problem Relation Age of Onset   Allergic rhinitis Mother    Asthma Neg Hx    Eczema Neg Hx    Urticaria Neg Hx       Review of Systems  Constitutional:  Positive for malaise/fatigue. Negative for chills and fever.  HENT: Negative.  Negative for congestion and sore throat.   Respiratory:  Positive for cough and shortness of breath.   Cardiovascular:  Negative for chest pain and palpitations.  Gastrointestinal:  Negative for abdominal pain, diarrhea, nausea and vomiting.  Genitourinary: Negative.   Skin: Negative.  Negative for rash.  Neurological: Negative.  Negative for dizziness and headaches.  All other systems reviewed and are negative.  Vitals:   12/02/20 1103  BP: 122/70  Pulse: 74  Temp:  98.1 F (36.7 C)  SpO2: 94%    Physical Exam Vitals reviewed.  Constitutional:      Appearance: She is well-developed.  HENT:     Head: Normocephalic.  Eyes:     Extraocular Movements: Extraocular movements intact.     Pupils: Pupils are equal, round, and reactive to light.  Cardiovascular:     Rate and Rhythm: Normal rate and regular rhythm.     Pulses: Normal pulses.     Heart sounds: Normal heart sounds.  Pulmonary:     Effort: Pulmonary effort is normal.     Breath sounds: Normal breath sounds. No wheezing or rales.  Musculoskeletal:     Cervical back: Normal range of motion and neck supple. No tenderness.     Right lower leg: No edema.     Left lower leg: No edema.  Lymphadenopathy:     Cervical: No cervical adenopathy.  Skin:    General: Skin is warm and dry.     Capillary Refill: Capillary refill takes less than 2 seconds.  Neurological:     General: No focal deficit present.     Mental Status: She is alert and oriented to person, place, and time.  Psychiatric:        Mood and Affect: Mood normal.        Behavior: Behavior normal.   DG Chest 2 View  Result Date: 12/02/2020 CLINICAL DATA:  Recent bronchitis with persistent cough EXAM: CHEST - 2 VIEW COMPARISON:  11/18/2020 FINDINGS: The heart size and mediastinal contours are within normal limits. Both lungs are clear. No  pleural effusion. The visualized skeletal structures are unremarkable. IMPRESSION: No active cardiopulmonary disease. Electronically Signed   By: Macy Mis M.D.   On: 12/02/2020 11:58     ASSESSMENT & PLAN: Clinically improving.  No red flag signs or symptoms.  Stable vitals with normal O2 sat and normal chest x-ray.  No signs of bronchospasm on physical examination.  Infection running its course. Katlin was seen today for thrush and shortness of breath.  Diagnoses and all orders for this visit:  Oral thrush -     fluconazole (DIFLUCAN) 150 MG tablet; Take 1 tablet (150 mg total) by mouth once for 1 dose. -     nystatin (MYCOSTATIN) 100000 UNIT/ML suspension; Take 5 mLs (500,000 Units total) by mouth 4 (four) times daily for 5 days.  Cough -     DG Chest 2 View  Bronchitis Comments: Improving Orders: -     DG Chest 2 View  Patient Instructions  Oral Thrush, Adult Oral thrush is an infection in your mouth and throat and on your tongue. It causes white patches to form in your mouth and on your tongue. Many cases of thrush are mild. But, sometimes, thrush can be serious. People who have a weak body defense system (immune system) or other diseases can be affected more. What are the causes? This condition is caused by a type of fungus called yeast. The fungus is normally present in small amounts in the mouth and nose. If a person has a long-term illness or a weak body defense system, the fungus can grow and spread quickly. This causes thrush. What increases the risk? You are more likely to develop this condition if: You have a weak body defense system. You are an older adult. You have diabetes, cancer, or HIV. You have a dry mouth. You are pregnant or breastfeeding. You do not take good care of your teeth. This risk  is greater for people who have false teeth (dentures). You use antibiotic or steroid medicines. What are the signs or symptoms? Symptoms of this condition include: A  burning feeling in the mouth and throat. White patches that stick to the mouth and tongue. A bad taste in the mouth or trouble tasting foods. A feeling like you have cotton in your mouth. Pain when you eat and swallow. Not wanting to eat as much as usual. Cracking at the corners of the mouth. How is this treated? This condition is treated with medicines called antifungals. These medicines prevent a fungus from growing. The medicines are either put right on the area (topical) or swallowed (oral). Your doctor will also treat other problems that you may have, such as diabetes or HIV. Follow these instructions at home: Medicines Take or use over-the-counter and prescription medicines only as told by your doctor. Ask your doctor about an over-the-counter medicine called gentian violet. Helping with pain and soreness To lessen your pain: Drink cold liquids, like water and iced tea. Eat frozen ice pops or frozen juices. Eat foods that are easy to swallow, like gelatin and ice cream. Drink from a straw if you have too much pain in your mouth.  General instructions Eat plain yogurt that has live cultures in it. Read the label to make sure that there are live cultures in your yogurt. If you wear false teeth: Take them out before you go to bed. Brush them well. Soak them in a cleaner. Rinse your mouth with warm salt-water many times a day. To make the salt-water mixture, dissolve -1 teaspoon (3-6 g) of salt in 1 cup (237 mL) of warm water. Contact a doctor if: Your problems do not get better within 7 days of treatment. Your infection is spreading. This may show as white areas on the skin outside of your mouth. You are nursing your baby and you have redness and pain in the nipples. Summary Oral thrush is an infection in your mouth and throat. It is caused by a fungus. You are more likely to get this condition if you have a weak body defense system. Diseases like diabetes, cancer, or HIV also  add to your risk. This condition is treated with medicines called antifungals. Contact a doctor if you do not get better within 7 days of starting treatment. This information is not intended to replace advice given to you by your health care provider. Make sure you discuss any questions you have with your health care provider. Document Revised: 01/04/2019 Document Reviewed: 01/04/2019 Elsevier Patient Education  2022 [place], [place] [facility]

## 2020-12-07 ENCOUNTER — Other Ambulatory Visit: Payer: Self-pay | Admitting: Internal Medicine

## 2020-12-22 LAB — HM DIABETES EYE EXAM

## 2020-12-25 ENCOUNTER — Encounter: Payer: Self-pay | Admitting: Internal Medicine

## 2020-12-26 MED ORDER — VALACYCLOVIR HCL 500 MG PO TABS: 500.0000 mg | ORAL_TABLET | Freq: Two times a day (BID) | ORAL | 3 refills | Status: DC

## 2020-12-29 ENCOUNTER — Encounter: Payer: Self-pay | Admitting: Internal Medicine

## 2020-12-29 NOTE — Progress Notes (Signed)
Outside notes received. Information abstracted. Notes sent to scan.  

## 2020-12-31 ENCOUNTER — Encounter: Payer: Self-pay | Admitting: Internal Medicine

## 2020-12-31 NOTE — Progress Notes (Signed)
Outside notes received. Information abstracted. Notes sent to scan.  

## 2021-01-18 ENCOUNTER — Telehealth: Payer: Self-pay | Admitting: Internal Medicine

## 2021-01-18 DIAGNOSIS — J45909 Unspecified asthma, uncomplicated: Secondary | ICD-10-CM

## 2021-01-18 NOTE — Telephone Encounter (Signed)
Patient scheduled 01/26/21 for asthma consult with Dr. Shearon Stalls. Per office asthma protocol Ige and cbc with diff are recommended.  Patient stated she would have labs drawn 01/21/21.  Order placed. Nothing further at this time.

## 2021-01-21 ENCOUNTER — Other Ambulatory Visit (INDEPENDENT_AMBULATORY_CARE_PROVIDER_SITE_OTHER): Payer: Medicare Other

## 2021-01-21 DIAGNOSIS — J45909 Unspecified asthma, uncomplicated: Secondary | ICD-10-CM

## 2021-01-21 LAB — CBC WITH DIFFERENTIAL/PLATELET
Basophils Absolute: 0 10*3/uL (ref 0.0–0.1)
Basophils Relative: 0.3 % (ref 0.0–3.0)
Eosinophils Absolute: 0.1 10*3/uL (ref 0.0–0.7)
Eosinophils Relative: 1.8 % (ref 0.0–5.0)
HCT: 41.8 % (ref 36.0–46.0)
Hemoglobin: 13.3 g/dL (ref 12.0–15.0)
Lymphocytes Relative: 16.5 % (ref 12.0–46.0)
Lymphs Abs: 1 10*3/uL (ref 0.7–4.0)
MCHC: 31.9 g/dL (ref 30.0–36.0)
MCV: 88.1 fl (ref 78.0–100.0)
Monocytes Absolute: 0.5 10*3/uL (ref 0.1–1.0)
Monocytes Relative: 8.2 % (ref 3.0–12.0)
Neutro Abs: 4.4 10*3/uL (ref 1.4–7.7)
Neutrophils Relative %: 73.2 % (ref 43.0–77.0)
Platelets: 274 10*3/uL (ref 150.0–400.0)
RBC: 4.74 Mil/uL (ref 3.87–5.11)
RDW: 14.1 % (ref 11.5–15.5)
WBC: 6 10*3/uL (ref 4.0–10.5)

## 2021-01-22 LAB — IGE: IgE (Immunoglobulin E), Serum: 144 kU/L — ABNORMAL HIGH (ref <=114)

## 2021-01-26 ENCOUNTER — Encounter: Payer: Self-pay | Admitting: Internal Medicine

## 2021-01-26 ENCOUNTER — Other Ambulatory Visit: Payer: Self-pay

## 2021-01-26 ENCOUNTER — Ambulatory Visit (INDEPENDENT_AMBULATORY_CARE_PROVIDER_SITE_OTHER): Payer: Medicare Other | Admitting: Internal Medicine

## 2021-01-26 VITALS — BP 134/76 | HR 89 | Temp 98.0°F | Ht 65.0 in | Wt 205.4 lb

## 2021-01-26 DIAGNOSIS — J4550 Severe persistent asthma, uncomplicated: Secondary | ICD-10-CM

## 2021-01-26 DIAGNOSIS — J310 Chronic rhinitis: Secondary | ICD-10-CM | POA: Diagnosis not present

## 2021-01-26 MED ORDER — MONTELUKAST SODIUM 10 MG PO TABS: 10.0000 mg | ORAL_TABLET | Freq: Every day | ORAL | 11 refills | Status: DC

## 2021-01-26 MED ORDER — ADVAIR HFA 230-21 MCG/ACT IN AERO: 2.0000 | INHALATION_SPRAY | Freq: Two times a day (BID) | RESPIRATORY_TRACT | 12 refills | Status: DC

## 2021-01-26 NOTE — Patient Instructions (Signed)
Please schedule follow up scheduled with myself in 1 months.  If my schedule is not open yet, we will contact you with a reminder closer to that time.  Will have you do breathing testing at next visit  Stop Breo. Switch to Advair 22 puffs twice a day gargle after use.  Start singulair for your rhinitis and asthma. 1 pill once a day.   By learning about asthma and how it can be controlled, you take an important step toward managing this disease. Work closely with your asthma care team to learn all you can about your asthma, how to avoid triggers, what your medications do, and how to take them correctly. With proper care, you can live free of asthma symptoms and maintain a normal, healthy lifestyle.   What is asthma? Asthma is a chronic disease that affects the airways of the lungs. During normal breathing, the bands of muscle that surround the airways are relaxed and air moves freely. During an asthma episode or "attack," there are three main changes that stop air from moving easily through the airways: The bands of muscle that surround the airways tighten and make the airways narrow. This tightening is called bronchospasm.  The lining of the airways becomes swollen or inflamed.  The cells that line the airways produce more mucus, which is thicker than normal and clogs the airways.  These three factors - bronchospasm, inflammation, and mucus production - cause symptoms such as difficulty breathing, wheezing, and coughing.  What are the most common symptoms of asthma? Asthma symptoms are not the same for everyone. They can even change from episode to episode in the same person. Also, you may have only one symptom of asthma, such as cough, but another person may have all the symptoms of asthma. It is important to know all the symptoms of asthma and to be aware that your asthma can present in any of these ways at any time. The most common symptoms include: Coughing, especially at night  Shortness of  breath  Wheezing  Chest tightness, pain, or pressure   Who is affected by asthma? Asthma affects 22 million Americans; about 6 million of these are children under age 30. People who have a family history of asthma have an increased risk of developing the disease. Asthma is also more common in people who have allergies or who are exposed to tobacco smoke. However, anyone can develop asthma at any time. Some people may have asthma all of their lives, while others may develop it as adults.  What causes asthma? The airways in a person with asthma are very sensitive and react to many things, or "triggers." Contact with these triggers causes asthma symptoms. One of the most important parts of asthma control is to identify your triggers and then avoid them when possible. The only trigger you do not want to avoid is exercise. Pre-treatment with medicines before exercise can allow you to stay active yet avoid asthma symptoms. Common asthma triggers include: Infections (colds, viruses, flu, sinus infections)  Exercise  Weather (changes in temperature and/or humidity, cold air)  Tobacco smoke  Allergens (dust mites, pollens, pets, mold spores, cockroaches, and sometimes foods)  Irritants (strong odors from cleaning products, perfume, wood smoke, air pollution)  Strong emotions such as crying or laughing hard  Some medications   How is asthma diagnosed? To diagnose asthma, your doctor will first review your medical history, family history, and symptoms. Your doctor will want to know any past history of breathing problems  you may have had, as well as a family history of asthma, allergies, eczema (a bumpy, itchy skin rash caused by allergies), or other lung disease. It is important that you describe your symptoms in detail (cough, wheeze, shortness of breath, chest tightness), including when and how often they occur. The doctor will perform a physical examination and listen to your heart and lungs. He or she  may also order breathing tests, allergy tests, blood tests, and chest and sinus X-rays. The tests will find out if you do have asthma and if there are any other conditions that are contributing factors.  How is asthma treated? Asthma can be controlled, but not cured. It is not normal to have frequent symptoms, trouble sleeping, or trouble completing tasks. Appropriate asthma care will prevent symptoms and visits to the emergency room and hospital. Asthma medicines are one of the mainstays of asthma treatment. The drugs used to treat asthma are explained below.  Anti-inflammatories: These are the most important drugs for most people with asthma. Anti-inflammatory drugs reduce swelling and mucus production in the airways. As a result, airways are less sensitive and less likely to react to triggers. These medications need to be taken daily and may need to be taken for several weeks before they begin to control asthma. Anti-inflammatory medicines lead to fewer symptoms, better airflow, less sensitive airways, less airway damage, and fewer asthma attacks. If taken every day, they CONTROL or prevent asthma symptoms.   Bronchodilators: These drugs relax the muscle bands that tighten around the airways. This action opens the airways, letting more air in and out of the lungs and improving breathing. Bronchodilators also help clear mucus from the lungs. As the airways open, the mucus moves more freely and can be coughed out more easily. In short-acting forms, bronchodilators RELIEVE or stop asthma symptoms by quickly opening the airways and are very helpful during an asthma episode. In long-acting forms, bronchodilators provide CONTROL of asthma symptoms and prevent asthma episodes.  Asthma drugs can be taken in a variety of ways. Inhaling the medications by using a metered dose inhaler, dry powder inhaler, or nebulizer is one way of taking asthma medicines. Oral medicines (pills or liquids you swallow) may also be  prescribed.  Asthma severity Asthma is classified as either "intermittent" (comes and goes) or "persistent" (lasting). Persistent asthma is further described as being mild, moderate, or severe. The severity of asthma is based on how often you have symptoms both during the day and night, as well as by the results of lung function tests and by how well you can perform activities. The "severity" of asthma refers to how "intense" or "strong" your asthma is.  Asthma control Asthma control is the goal of asthma treatment. Regardless of your asthma severity, it may or may not be controlled. Asthma control means: You are able to do everything you want to do at work and home  You have no (or minimal) asthma symptoms  You do not wake up from your sleep or earlier than usual in the morning due to asthma  You rarely need to use your reliever medicine (inhaler)  Another major part of your treatment is that you are happy with your asthma care and believe your asthma is controlled.  Monitoring symptoms A key part of treatment is keeping track of how well your lungs are working. Monitoring your symptoms  what they are, how and when they happen, and how severe they are  is an important part of being able  to control your asthma.  Sometimes asthma is monitored using a peak flow meter. A peak flow (PF) meter measures how fast the air comes out of your lungs. It can help you know when your asthma is getting worse, sometimes even before you have symptoms. By taking daily peak flow readings, you can learn when to adjust medications to keep asthma under good control. It is also used to create your asthma action plan (see below). Your doctor can use your peak flow readings to adjust your treatment plan in some cases.  Asthma Action Plan Based on your history and asthma severity, you and your doctor will develop a care plan called an "asthma action plan." The asthma action plan describes when and how to use your  medicines, actions to take when asthma worsens, and when to seek emergency care. Make sure you understand this plan. If you do not, ask your asthma care provider any questions you may have. Your asthma action plan is one of the keys to controlling asthma. Keep it readily available to remind you of what you need to do every day to control asthma and what you need to do when symptoms occur.  Goals of asthma therapy These are the goals of asthma treatment: Live an active, normal life  Prevent chronic and troublesome symptoms  Attend work or school every day  Perform daily activities without difficulty  Stop urgent visits to the doctor, emergency department, or hospital  Use and adjust medications to control asthma with few or no side effects

## 2021-01-26 NOTE — Progress Notes (Signed)
Dana Mccoy    [id number]    02/28/1956  Primary Care Physician:Burns, Claudina Lick, MD  Referring Physician: Binnie Rail, MD [place],  [place] [address] Reason for Consultation: bronchitis Date of Consultation: 01/26/2021  Chief complaint:   Chief Complaint  Patient presents with   Consult    Patient says she's had bronchitis 5 times this year and wants to talk to doctor about it      HPI: Dana Mccoy is a 65 y.o. woman with recurrent bronchitis who presents for new patient evaluation for bronchitis.   She typically gets episodes of bronchitis a couple of times a year and this year has missed 1-2 months of work.   She has had episode of allergic reactions and carries an epi pen. (Had tongue swelling and questionable angioedema?) has allergies to some foods including nuts.   Denies childhood asthma or respiratory disease. She does remember getting worsening respiratory disease and URIs worse than her peers. She had pneumonia in college and saw Dr Linna Darner previously. She had recurrent sinus issues.   She is usually treated with supportive care, otherwise has been treated with azithromycin or doxycline with prednisone. The prednisone does help. She does get side effects with indigestion and insomnia.   Episodes are triggered by URIs because she takes care of her grandchildren - they go to pre-school in [place] and she flies up to help take care of them.    She does have environmental allergies in the fall - dust, mold, grass. She takes claritin twice a day for unknown allergic reactions.  She has been on Breo for a long time- has taken maintenance inhaler everyday for 15 years.    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  Exposures: lives at home alone with dog.  Smoking history: former smoker 15 pack years, quit in 1987.   Social History   Occupational  History    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  Tobacco Use   Smoking status: Former    Types: Cigarettes    Quit date: 03/14/1984    Years since quitting: 36.8   Smokeless tobacco: Never   Tobacco comments:    smoked 1975-1986, up to 1.5 ppd  Vaping Use   Vaping Use: Never used  Substance and Sexual Activity   Alcohol use: No    Comment: very rarely   Drug use: No   Sexual activity: Not on file    Relevant family history:  Family History  Adopted: Yes  Problem Relation Age of Onset   Allergic rhinitis Mother    Asthma Neg Hx    Eczema Neg Hx    Urticaria Neg Hx     Past Medical History:  Diagnosis Date   Angio-edema    Asthma exacerbation, mild 02/12/2020   GERD (gastroesophageal reflux disease)    Hyperlipidemia    Hypertension    Reactive airway disease    post CAP    Past Surgical History:  Procedure Laterality Date   CARDIAC CATHETERIZATION  1997   negative   COLONOSCOPY  2007   Dr Cristina Gong   DILATION AND CURETTAGE OF UTERUS     UPPER GI ENDOSCOPY     X 2   WISDOM TOOTH EXTRACTION       Physical Exam: Blood pressure 134/76, pulse 89, temperature 98 F (36.7 C), temperature source Oral, height 5\' 5"  (  1.651 m), weight 205 lb 6.4 oz (93.2 kg), SpO2 97 %. Gen:      No acute distress, hoarse voice ENT:  no nasal polyps, mucus membranes moist Lungs:    No increased respiratory effort, symmetric chest wall excursion, clear to auscultation bilaterally, no wheezes or crackles CV:         Regular rate and rhythm; no murmurs, rubs, or gallops.  No pedal edema Abd:      + bowel sounds; soft, non-tender; no distension MSK: no acute synovitis of DIP or PIP joints, no mechanics hands.  Skin:      Warm and dry; no rashes Neuro: normal speech, no focal facial asymmetry Psych: alert and oriented x3, normal mood and affect   Data Reviewed/Medical Decision Making:  Independent interpretation of tests: Imaging:  Review of patient's chest xray 11/2020 images revealed no acute  cardiopulmonary disease. Ctpe study 11/2020 shows mild peribronchial thickening. The patient's images have been independently reviewed by me.    PFTs: I have personally reviewed the patient's PFTs and spirometry 10/2020 shows no airflow limitation   Labs:  No eosinophilia. Elevated IgE 144 Lab Results  Component Value Date   WBC 6.0 01/21/2021   HGB 13.3 01/21/2021   HCT 41.8 01/21/2021   MCV 88.1 01/21/2021   PLT 274.0 01/21/2021   Lab Results  Component Value Date   NA 139 11/18/2020   K 3.7 11/18/2020   CL 100 11/18/2020   CO2 27 11/18/2020    Immunization status:  Immunization History  Administered Date(s) Administered   Influenza, High Dose Seasonal PF 12/10/2020   Influenza,inj,Quad PF,6+ Mos 11/16/2016   Influenza-Unspecified 12/12/2017   Pfizer Covid-19 Vaccine Bivalent Booster 83yrs & up 12/24/2020   Pneumococcal Polysaccharide-23 07/31/2012     I reviewed prior external note(s) from allergy, pcp, ed  I reviewed the result(s) of the labs and imaging as noted above.   I have ordered spriometry and feno  Assessment:  Severe persistent asthma, poorly controlled GERD, controlled Chronic allergic rhinitis, poorly controlled  Plan/Recommendations: Poorly controlled asthma. Increase ICS to Advair HFA 230 2 puffs bid.  Continue prn albuterol Continue PPI for GERD Continue claritin for rhinitis and her allergies.  Modified technique for flonase. Will add singulair for rhinitis.   We discussed disease management and progression at length today for asthma. I think she will benefit from biologic therapy if no improvement. I will see her back in a few weeks after starting increased dose ICS-LABA.   Return to Care: Return in about 4 weeks (around 02/23/2021).  Lenice Llamas, MD [facility] [place]  CC: Binnie Rail, MD

## 2021-01-28 ENCOUNTER — Telehealth: Payer: Self-pay | Admitting: Internal Medicine

## 2021-01-28 NOTE — Telephone Encounter (Signed)
ATC left detailed message that spacers were available on-line or we would be happy to send a Rx if needed. Ask the pt to call back when decided. Nothing further needed at this time.

## 2021-02-17 NOTE — Progress Notes (Shared)
[facility] Note  02/23/2021     CHIEF COMPLAINT Patient presents for No chief complaint on file.   HISTORY OF PRESENT ILLNESS: Dana Mccoy is a 65 y.o. female who presents to the clinic today for:     Referring physician: Binnie Rail, MD [place],  [place] [address]  HISTORICAL INFORMATION:   Selected notes from the MEDICAL RECORD NUMBER Referred by Dr. Herbert Deaner for eval of ret tuft OS   CURRENT MEDICATIONS: No current outpatient medications on file. (Ophthalmic Drugs)   No current facility-administered medications for this visit. (Ophthalmic Drugs)   Current Outpatient Medications (Other)  Medication Sig   albuterol (VENTOLIN HFA) 108 (90 Base) MCG/ACT inhaler Inhale 1-2 puffs into the lungs every 4 (four) hours as needed for wheezing or shortness of breath.   COVID-19 At Home Antigen Test KIT Use as directed   EPINEPHrine 0.3 mg/0.3 mL IJ SOAJ injection Inject 0.3 mg into the muscle as needed for anaphylaxis.   FLUoxetine (PROZAC) 20 MG capsule TAKE 1 CAPSULE (20 MG TOTAL) BY MOUTH DAILY   fluticasone (VERAMYST) 27.5 MCG/SPRAY nasal spray Place 2 sprays into the nose daily.   fluticasone-salmeterol (ADVAIR HFA) 230-21 MCG/ACT inhaler Inhale 2 puffs into the lungs 2 (two) times daily.   hydrochlorothiazide (MICROZIDE) 12.5 MG capsule TAKE 1 CAPSULE BY MOUTH ONCE DAILY   HYDROcodone bit-homatropine (HYCODAN) 5-1.5 MG/5ML syrup Take 5 mLs by mouth every 8 (eight) hours as needed for cough. (Patient not taking: Reported on 01/26/2021)   loratadine (CLARITIN) 10 MG tablet Take 1 tablet (10 mg total) by mouth 2 (two) times daily.   mometasone (ELOCON) 0.1 % cream mometasone 0.1 % topical cream  APPLY A THIN LAYER TO THE AFFECTED AREA OF THE SKIN DAILY   montelukast (SINGULAIR) 10 MG tablet Take 1 tablet (10 mg total) by mouth at bedtime.   pantoprazole (PROTONIX) 40 MG tablet TAKE 1 TABLET BY MOUTH TWO TIMES DAILY 30 MIN  BEFORE BREAKFAST AND DINNER   valACYclovir (VALTREX) 500 MG tablet Take 1 tablet (500 mg total) by mouth 2 (two) times daily. (Patient not taking: Reported on 01/26/2021)   VITAMIN D, CHOLECALCIFEROL, PO Take 400 mg by mouth.   No current facility-administered medications for this visit. (Other)      REVIEW OF SYSTEMS:    ALLERGIES Allergies  Allergen Reactions   Penicillins     RASH, amoxicillin   Lipitor [Atorvastatin]     Cognitive impact    PAST MEDICAL HISTORY Past Medical History:  Diagnosis Date   Angio-edema    Asthma exacerbation, mild 02/12/2020   GERD (gastroesophageal reflux disease)    Hyperlipidemia    Hypertension    Reactive airway disease    post CAP   Past Surgical History:  Procedure Laterality Date   CARDIAC CATHETERIZATION  1997   negative   COLONOSCOPY  2007   Dr Cristina Gong   DILATION AND CURETTAGE OF UTERUS     UPPER GI ENDOSCOPY     X 2   WISDOM TOOTH EXTRACTION      FAMILY HISTORY Family History  Adopted: Yes  Problem Relation Age of Onset   Allergic rhinitis Mother    Asthma Neg Hx    Eczema Neg Hx    Urticaria Neg Hx     SOCIAL HISTORY Social History   Tobacco Use   Smoking status: Former    Types: Cigarettes    Quit date: 03/14/1984  Years since quitting: 36.9   Smokeless tobacco: Never   Tobacco comments:    smoked 1975-1986, up to 1.5 ppd  Vaping Use   Vaping Use: Never used  Substance Use Topics   Alcohol use: No    Comment: very rarely   Drug use: No         OPHTHALMIC EXAM:  Not recorded     IMAGING AND PROCEDURES  Imaging and Procedures for 02/23/2021           ASSESSMENT/PLAN:    ICD-10-CM   1. Retinal edema  H35.81       1.  2.  3.  Ophthalmic Meds Ordered this visit:  No orders of the defined types were placed in this encounter.      No follow-ups on file.  There are no Patient Instructions on file for this visit.   Explained the diagnoses, plan, and follow up with the  patient and they expressed understanding.  Patient expressed understanding of the importance of proper follow up care.   This document serves as a record of services personally performed by Gardiner Sleeper, MD, PhD. It was created on their behalf by Estill Bakes, COT an ophthalmic technician. The creation of this record is the provider's dictation and/or activities during the visit.    Electronically signed by: Estill Bakes, COT 12.7.22 @ 11:04 AM   Gardiner Sleeper, M.D., Ph.D. Diseases & Surgery of the Retina and Vitreous [facility]: M myopia (nearsighted); A astigmatism; H hyperopia (farsighted); P presbyopia; Mrx spectacle prescription;  CTL contact lenses; OD right eye; OS left eye; OU both eyes  XT exotropia; ET esotropia; PEK punctate epithelial keratitis; PEE punctate epithelial erosions; DES dry eye syndrome; MGD meibomian gland dysfunction; ATs artificial tears; PFAT's preservative free artificial tears; [place] nuclear sclerotic cataract; PSC posterior subcapsular cataract; ERM epi-retinal membrane; PVD posterior vitreous detachment; RD retinal detachment; DM diabetes mellitus; DR diabetic retinopathy; NPDR non-proliferative diabetic retinopathy; PDR proliferative diabetic retinopathy; CSME clinically significant macular edema; DME diabetic macular edema; dbh dot blot hemorrhages; CWS cotton wool spot; POAG primary open angle glaucoma; C/D cup-to-disc ratio; HVF humphrey visual field; GVF goldmann visual field; OCT optical coherence tomography; IOP intraocular pressure; BRVO Branch retinal vein occlusion; CRVO central retinal vein occlusion; CRAO central retinal artery occlusion; BRAO branch retinal artery occlusion; RT retinal tear; SB scleral buckle; PPV pars plana vitrectomy; VH Vitreous hemorrhage; PRP panretinal laser photocoagulation; IVK intravitreal kenalog; VMT vitreomacular traction; MH Macular hole;  NVD neovascularization of the disc; NVE  neovascularization elsewhere; AREDS age related eye disease study; ARMD age related macular degeneration; POAG primary open angle glaucoma; EBMD epithelial/anterior basement membrane dystrophy; ACIOL anterior chamber intraocular lens; IOL intraocular lens; PCIOL posterior chamber intraocular lens; Phaco/IOL phacoemulsification with intraocular lens placement; [place] photorefractive keratectomy; LASIK laser assisted in situ keratomileusis; HTN hypertension; DM diabetes mellitus; COPD chronic obstructive pulmonary disease

## 2021-02-22 ENCOUNTER — Encounter (INDEPENDENT_AMBULATORY_CARE_PROVIDER_SITE_OTHER): Payer: Self-pay | Admitting: Ophthalmology

## 2021-02-22 ENCOUNTER — Ambulatory Visit (INDEPENDENT_AMBULATORY_CARE_PROVIDER_SITE_OTHER): Payer: Medicare Other | Admitting: Ophthalmology

## 2021-02-22 ENCOUNTER — Other Ambulatory Visit: Payer: Self-pay

## 2021-02-22 DIAGNOSIS — H3540 Unspecified peripheral retinal degeneration: Secondary | ICD-10-CM | POA: Diagnosis not present

## 2021-02-22 DIAGNOSIS — I10 Essential (primary) hypertension: Secondary | ICD-10-CM

## 2021-02-22 DIAGNOSIS — H35033 Hypertensive retinopathy, bilateral: Secondary | ICD-10-CM | POA: Diagnosis not present

## 2021-02-22 DIAGNOSIS — H3581 Retinal edema: Secondary | ICD-10-CM

## 2021-02-22 DIAGNOSIS — H25813 Combined forms of age-related cataract, bilateral: Secondary | ICD-10-CM

## 2021-02-22 DIAGNOSIS — H53001 Unspecified amblyopia, right eye: Secondary | ICD-10-CM

## 2021-02-22 NOTE — Progress Notes (Signed)
[facility] - Clinic Note  02/22/2021     CHIEF COMPLAINT Patient presents for Retina Evaluation   HISTORY OF PRESENT ILLNESS: Dana Mccoy is a 65 y.o. female who presents to the clinic today for:   HPI     Retina Evaluation   In left eye.  This started 9 weeks ago.  Duration of 9 weeks.  I, the attending physician,  performed the HPI with the patient and updated documentation appropriately.        Comments   Pt here for ret eval, referred by Dr. Weaver for retinal tuft OS. Pt states she was in for her annual visit at [facility]. Pt reports she had a hemorrhage in the back of OS when she had her retinal photos done. Pt has not had any reports of vision changes though she does note that OS is a lazy eye and OD is her dominant eye. Pt is a glaucoma suspect per notes from [facility]. Pt reports for the past year she has had some severe bronchitis with consistent hard coughing.       Last edited by Zamora, Brian, MD on 02/23/2021 11:48 AM.    Pt is here on the referral of Dr. Weaver for concern of VR tuft OS, pt states she has occasional fol and floaters OS, but nothing "major", pt was dx with amblyopia OD at 65 years old, she used a patch at that time, pt states she has a "little bit of hypertension" and takes HCTZ, pt denies any trauma or eye surgeries  Referring physician: Weaver, Christopher D, MD [address] [place],  [place] [address]  HISTORICAL INFORMATION:   Selected notes from the medical record:  Referred by Dr. Weaver for concern of VR tuft LEE:  Ocular Hx- PMH-    CURRENT MEDICATIONS: No current outpatient medications on file. (Ophthalmic Drugs)   No current facility-administered medications for this visit. (Ophthalmic Drugs)   Current Outpatient Medications (Other)  Medication Sig   albuterol (VENTOLIN HFA) 108 (90 Base) MCG/ACT inhaler Inhale 1-2 puffs into the lungs every 4 (four) hours as needed for  wheezing or shortness of breath.   EPINEPHrine 0.3 mg/0.3 mL IJ SOAJ injection Inject 0.3 mg into the muscle as needed for anaphylaxis.   FLUoxetine (PROZAC) 20 MG capsule TAKE 1 CAPSULE (20 MG TOTAL) BY MOUTH DAILY   fluticasone (VERAMYST) 27.5 MCG/SPRAY nasal spray Place 2 sprays into the nose daily.   fluticasone-salmeterol (ADVAIR HFA) 230-21 MCG/ACT inhaler Inhale 2 puffs into the lungs 2 (two) times daily.   hydrochlorothiazide (MICROZIDE) 12.5 MG capsule TAKE 1 CAPSULE BY MOUTH ONCE DAILY   loratadine (CLARITIN) 10 MG tablet Take 1 tablet (10 mg total) by mouth 2 (two) times daily.   mometasone (ELOCON) 0.1 % cream mometasone 0.1 % topical cream  APPLY A THIN LAYER TO THE AFFECTED AREA OF THE SKIN DAILY   montelukast (SINGULAIR) 10 MG tablet Take 1 tablet (10 mg total) by mouth at bedtime.   VITAMIN D, CHOLECALCIFEROL, PO Take 400 mg by mouth.   COVID-19 At Home Antigen Test KIT Use as directed   HYDROcodone bit-homatropine (HYCODAN) 5-1.5 MG/5ML syrup Take 5 mLs by mouth every 8 (eight) hours as needed for cough. (Patient not taking: Reported on 01/26/2021)   pantoprazole (PROTONIX) 40 MG tablet TAKE 1 TABLET BY MOUTH TWO TIMES DAILY 30 MIN BEFORE BREAKFAST AND DINNER   valACYclovir (VALTREX) 500 MG tablet Take 1 tablet (500 mg total)   by mouth 2 (two) times daily. (Patient not taking: Reported on 01/26/2021)   No current facility-administered medications for this visit. (Other)   REVIEW OF SYSTEMS: ROS   Positive for: Gastrointestinal, Cardiovascular, Respiratory Last edited by Simpson, Makenzie E, COT on 02/22/2021  2:00 PM.     ALLERGIES Allergies  Allergen Reactions   Penicillins     RASH, amoxicillin   Lipitor [Atorvastatin]     Cognitive impact   PAST MEDICAL HISTORY Past Medical History:  Diagnosis Date   Angio-edema    Asthma exacerbation, mild 02/12/2020   GERD (gastroesophageal reflux disease)    Hyperlipidemia    Hypertension    Reactive airway disease     post CAP   Past Surgical History:  Procedure Laterality Date   CARDIAC CATHETERIZATION  1997   negative   COLONOSCOPY  2007   Dr Buccini   DILATION AND CURETTAGE OF UTERUS     UPPER GI ENDOSCOPY     X 2   WISDOM TOOTH EXTRACTION      FAMILY HISTORY Family History  Adopted: Yes  Problem Relation Age of Onset   Allergic rhinitis Mother    Asthma Neg Hx    Eczema Neg Hx    Urticaria Neg Hx     SOCIAL HISTORY Social History   Tobacco Use   Smoking status: Former    Types: Cigarettes    Quit date: 03/14/1984    Years since quitting: 36.9   Smokeless tobacco: Never   Tobacco comments:    smoked 1975-1986, up to 1.5 ppd  Vaping Use   Vaping Use: Never used  Substance Use Topics   Alcohol use: No    Comment: very rarely   Drug use: No       OPHTHALMIC EXAM:  Base Eye Exam     Visual Acuity (Snellen - Linear)       Right Left   Dist cc 20/50 -2 20/25 -1   Dist ph cc 20/50 NI+    Correction: Glasses         Tonometry (Tonopen, 2:21 PM)       Right Left   Pressure 11 16         Pupils       Dark Light Shape React APD   Right 3 2 Round Brisk None   Left 3 2 Round Brisk None         Visual Fields (Counting fingers)       Left Right    Full Full         Extraocular Movement       Right Left    Full, Ortho Full, Ortho         Neuro/Psych     Oriented x3: Yes   Mood/Affect: Normal         Dilation     Both eyes: 1.0% Mydriacyl, 2.5% Phenylephrine @ 2:21 PM           Slit Lamp and Fundus Exam     Slit Lamp Exam       Right Left   Lids/Lashes Dermatochalasis - upper lid Dermatochalasis - upper lid   Conjunctiva/Sclera White and quiet White and quiet   Cornea Clear Clear   Anterior Chamber deep, clear, narrow temporal angle deep, clear, narrow temporal angle   Iris Round and dilated Round and dilated   Lens 1-2+ Nuclear sclerosis, 1-2+ Cortical cataract 1-2+ Nuclear sclerosis, 1-2+ Cortical cataract   Anterior Vitreous  Vitreous syneresis, Posterior   vitreous detachment, vitreous condensations, Weiss ring Vitreous syneresis, Posterior vitreous detachment, Weiss ring         Fundus Exam       Right Left   Disc Pink and Sharp Pink and Sharp   C/D Ratio 0.3 0.4   Macula Flat, Good foveal reflex, mild RPE mottling, No heme or edema Flat, Good foveal reflex, mild RPE mottling, No heme or edema   Vessels mild attenuation, mild tortuousity mild attenuation, mild tortuousity   Periphery Attached, scattered reticular degeneration greatest nasal periphery, No heme Attached, scattered reticular degeneration greatest nasal periphery, no RT/RD or VR tufts, No heme           Refraction     Wearing Rx       Sphere Cylinder Axis Add   Right +3.00 +0.25 074 +2.00   Left +2.25 +0.50 169 +2.00         Manifest Refraction       Sphere Cylinder Axis Dist VA   Right +3.00 +0.25 075 20/60   Left +1.25 +0.75 170 20/30-2            IMAGING AND PROCEDURES  Imaging and Procedures for 02/22/2021  OCT, Retina - OU - Both Eyes       Right Eye Quality was good. Central Foveal Thickness: 268. Progression has no prior data. Findings include normal foveal contour, no IRF, no SRF.   Left Eye Quality was good. Central Foveal Thickness: 256. Progression has no prior data. Findings include normal foveal contour, no IRF, no SRF.   Notes *Images captured and stored on drive  Diagnosis / Impression:  NFP; no IRF/SRF OU   Clinical management:  See below  Abbreviations: NFP - Normal foveal profile. CME - cystoid macular edema. PED - pigment epithelial detachment. IRF - intraretinal fluid. SRF - subretinal fluid. EZ - ellipsoid zone. ERM - epiretinal membrane. ORA - outer retinal atrophy. ORT - outer retinal tubulation. SRHM - subretinal hyper-reflective material. IRHM - intraretinal hyper-reflective material            ASSESSMENT/PLAN:    ICD-10-CM   1. Peripheral reticular degeneration of both eyes   H35.40     2. Retinal edema  H35.81 OCT, Retina - OU - Both Eyes    3. Essential hypertension  I10     4. Hypertensive retinopathy of both eyes  H35.033     5. Combined forms of age-related cataract of both eyes  H25.813     6. Amblyopia of right eye  H53.001       1,2. Peripheral reticular degeneration OU  - scattered reticular degeneration greatest nasal periphery OU  - no RT/RD or VR tuft OU - pt is cleared from a retina standpoint for release to [facility] and resumption of primary eye care  - f/u here PRN  3,4. Hypertensive retinopathy OU - discussed importance of tight BP control - monitor  5. Mixed Cataract OU - The symptoms of cataract, surgical options, and treatments and risks were discussed with patient. - discussed diagnosis and progression - under the expert care of [facility]  6. Amblyopia OD  Ophthalmic Meds Ordered this visit:  No orders of the defined types were placed in this encounter.    Return if symptoms worsen or fail to improve.  There are no Patient Instructions on file for this visit.   Explained the diagnoses, plan, and follow up with the patient and they expressed understanding.  Patient expressed   understanding of the importance of proper follow up care.   This document serves as a record of services personally performed by Gardiner Sleeper, MD, PhD. It was created on their behalf by San Jetty. Owens Shark, OA an ophthalmic technician. The creation of this record is the provider's dictation and/or activities during the visit.    Electronically signed by: San Jetty. Marguerita Merles 12.12.2022 12:23 PM   Gardiner Sleeper, M.D., Ph.D. Diseases & Surgery of the Retina and Vitreous [facility]  I have reviewed the above documentation for accuracy and completeness, and I agree with the above. Gardiner Sleeper, M.D., Ph.D. 02/23/21 12:23 PM   Abbreviations: M myopia (nearsighted); A astigmatism; H  hyperopia (farsighted); P presbyopia; Mrx spectacle prescription;  CTL contact lenses; OD right eye; OS left eye; OU both eyes  XT exotropia; ET esotropia; PEK punctate epithelial keratitis; PEE punctate epithelial erosions; DES dry eye syndrome; MGD meibomian gland dysfunction; ATs artificial tears; PFAT's preservative free artificial tears; [place] nuclear sclerotic cataract; PSC posterior subcapsular cataract; ERM epi-retinal membrane; PVD posterior vitreous detachment; RD retinal detachment; DM diabetes mellitus; DR diabetic retinopathy; NPDR non-proliferative diabetic retinopathy; PDR proliferative diabetic retinopathy; CSME clinically significant macular edema; DME diabetic macular edema; dbh dot blot hemorrhages; CWS cotton wool spot; POAG primary open angle glaucoma; C/D cup-to-disc ratio; HVF humphrey visual field; GVF goldmann visual field; OCT optical coherence tomography; IOP intraocular pressure; BRVO Branch retinal vein occlusion; CRVO central retinal vein occlusion; CRAO central retinal artery occlusion; BRAO branch retinal artery occlusion; RT retinal tear; SB scleral buckle; PPV pars plana vitrectomy; VH Vitreous hemorrhage; PRP panretinal laser photocoagulation; IVK intravitreal kenalog; VMT vitreomacular traction; MH Macular hole;  NVD neovascularization of the disc; NVE neovascularization elsewhere; AREDS age related eye disease study; ARMD age related macular degeneration; POAG primary open angle glaucoma; EBMD epithelial/anterior basement membrane dystrophy; ACIOL anterior chamber intraocular lens; IOL intraocular lens; PCIOL posterior chamber intraocular lens; Phaco/IOL phacoemulsification with intraocular lens placement; [place] photorefractive keratectomy; LASIK laser assisted in situ keratomileusis; HTN hypertension; DM diabetes mellitus; COPD chronic obstructive pulmonary disease

## 2021-02-23 ENCOUNTER — Encounter: Payer: Self-pay | Admitting: Internal Medicine

## 2021-02-23 ENCOUNTER — Ambulatory Visit (INDEPENDENT_AMBULATORY_CARE_PROVIDER_SITE_OTHER): Payer: Medicare Other | Admitting: Internal Medicine

## 2021-02-23 ENCOUNTER — Encounter (INDEPENDENT_AMBULATORY_CARE_PROVIDER_SITE_OTHER): Payer: Medicare Other | Admitting: Ophthalmology

## 2021-02-23 ENCOUNTER — Other Ambulatory Visit: Payer: Self-pay | Admitting: Internal Medicine

## 2021-02-23 ENCOUNTER — Encounter (INDEPENDENT_AMBULATORY_CARE_PROVIDER_SITE_OTHER): Payer: Self-pay

## 2021-02-23 ENCOUNTER — Encounter (INDEPENDENT_AMBULATORY_CARE_PROVIDER_SITE_OTHER): Payer: Self-pay | Admitting: Ophthalmology

## 2021-02-23 VITALS — BP 120/86 | HR 74 | Ht 65.0 in | Wt 204.4 lb

## 2021-02-23 DIAGNOSIS — J301 Allergic rhinitis due to pollen: Secondary | ICD-10-CM

## 2021-02-23 DIAGNOSIS — H3581 Retinal edema: Secondary | ICD-10-CM

## 2021-02-23 DIAGNOSIS — J4550 Severe persistent asthma, uncomplicated: Secondary | ICD-10-CM | POA: Diagnosis not present

## 2021-02-23 NOTE — Patient Instructions (Addendum)
Please schedule follow up scheduled with myself in 4 months.  If my schedule is not open yet, we will contact you with a reminder closer to that time. Please call [phone] if you haven't heard from [name] a month before.   Before your next visit I would like you to have: Blood work today   Continue advair with spacer. Continue albuterol as needed.

## 2021-02-23 NOTE — Progress Notes (Signed)
Dana Mccoy    [id number]    December 04, 1955  Primary Care Physician:Burns, Claudina Lick, MD Date of Appointment: 02/23/2021 Established Patient Visit  Chief complaint:   Chief Complaint  Patient presents with   Follow-up    No new concerns     HPI: Dana Mccoy is a 65 y.o. woman with recurrent bronchitis who presents for new patient evaluation for bronchitis.   Interval Updates: Feeling much better. She is feeling much better with montelukast - making a huge difference with rhinitis. She does have vivid dreams but not a problem and she is ok to continue.   No thrush. Bought a spacer online to help with hfa.   Having dyspnea with exertion still - starting to use albuterol more often with exertion and it is helping.   Planning to visit grandchildren in [place] over holidays.   Current Regimen: Asthma Triggers: Advair 230 HFA 2 puffs twice a day, albuterol She does have environmental allergies in the fall - dust, mold, grass. URIs Exacerbations in the last year: multiple requiring prednisone in the last year History of hospitalization or intubation: never intubated Allergy Testing: yes food allergies tested multiple - all negative. Eats nuts fine. Had SPT done for environmental allergies many years ago.  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  Exposures: lives at home alone with dog.  Smoking history: former smoker 15 pack years, quit in 1987.   I have reviewed the patient's family social and past medical history and updated as appropriate.   Past Medical History:  Diagnosis Date   Angio-edema    Asthma exacerbation, mild 02/12/2020   GERD (gastroesophageal reflux disease)     Hyperlipidemia    Hypertension    Reactive airway disease    post CAP    Past Surgical History:  Procedure Laterality Date   CARDIAC CATHETERIZATION  1997   negative   COLONOSCOPY  2007   Dr Cristina Gong   DILATION AND CURETTAGE OF UTERUS     UPPER GI ENDOSCOPY     X 2   WISDOM TOOTH EXTRACTION      Family History  Adopted: Yes  Problem Relation Age of Onset   Allergic rhinitis Mother    Asthma Neg Hx    Eczema Neg Hx    Urticaria Neg Hx     Social History   Occupational History    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  Tobacco Use   Smoking status: Former    Types: Cigarettes    Quit date: 03/14/1984    Years since quitting: 36.9   Smokeless tobacco: Never   Tobacco comments:    smoked 1975-1986, up to 1.5 ppd  Vaping Use   Vaping Use: Never used  Substance and Sexual Activity   Alcohol use: No    Comment: very rarely   Drug use: No   Sexual activity: Not on file     Physical Exam: Blood pressure 120/86, pulse 74, height 5\' 5"  (1.651 m), weight 204 lb 6.4 oz (92.7 kg), SpO2 98 %.  Gen:      No acute distress, hoarse voice ENT:  no thrush, no nasal polyps, mucus membranes moist Lungs:    No  increased respiratory effort, symmetric chest wall excursion, clear to auscultation bilaterally, no wheezes or crackles CV:         Regular rate and rhythm; no murmurs, rubs, or gallops.  No pedal edema   Data Reviewed: Imaging: I have personally reviewed the chest xray 11/2020 no acute pulmonary process  PFTs: No flowsheet data found. Feno is 11 ppb today.  Spirometry normal 10/27/20  Labs: IgE 144 in Nov 2022  Immunization status: Immunization History  Administered Date(s) Administered   Influenza, High Dose Seasonal PF 12/10/2020   Influenza,inj,Quad PF,6+ Mos 11/16/2016   Influenza-Unspecified 12/12/2017   Pfizer Covid-19 Vaccine Bivalent Booster 80yrs & up 12/24/2020   Pneumococcal Polysaccharide-23 07/31/2012    External Records Personally Reviewed: Plmonary,  allergy, ED visits  Assessment:  Moderate persistent asthma Allergic rhinitis   Plan/Recommendations: Will obtain region 2 allergy panel - with elevated IgE may be able to try xolair.  Continue advair HFA with spacer Continue albuteorl prn Continue montelukast with claritin   Return to Care: Return in about 4 months (around 06/24/2021).   Lenice Llamas, MD Pulmonary and [place]

## 2021-02-24 LAB — RESPIRATORY ALLERGY PROFILE REGION II ~~LOC~~
Allergen, A. alternata, m6: <0.1 kU/L
Allergen, Cedar tree, t12: 0.11 kU/L — ABNORMAL HIGH
Allergen, Comm Silver Birch, t9: 0.12 kU/L — ABNORMAL HIGH
Allergen, Cottonwood, t14: 0.34 kU/L — ABNORMAL HIGH
Allergen, D pternoyssinus,d7: <0.1 kU/L
Allergen, Mouse Urine Protein, e78: <0.1 kU/L
Allergen, Mulberry, t76: <0.1 kU/L
Allergen, Oak,t7: 0.15 kU/L — ABNORMAL HIGH
Allergen, P. notatum, m1: <0.1 kU/L
Aspergillus fumigatus, m3: <0.1 kU/L
Bermuda Grass: <0.1 kU/L
Box Elder IgE: 0.16 kU/L — ABNORMAL HIGH
CLADOSPORIUM HERBARUM (M2) IGE: <0.1 kU/L
COMMON RAGWEED (SHORT) (W1) IGE: 0.17 kU/L — ABNORMAL HIGH
Cat Dander: <0.1 kU/L
Class: 0
Class: 0
Class: 0
Class: 0
Class: 0
Class: 0
Class: 0
Class: 0
Class: 0
Class: 0
Class: 0
Class: 0
Class: 0
Class: 0
Class: 0
Class: 0
Cockroach: <0.1 kU/L
D. farinae: <0.1 kU/L
Dog Dander: <0.1 kU/L
Elm IgE: 0.33 kU/L — ABNORMAL HIGH
IgE (Immunoglobulin E), Serum: 144 kU/L — ABNORMAL HIGH (ref <=114)
Johnson Grass: <0.1 kU/L
Pecan/Hickory Tree IgE: 0.11 kU/L — ABNORMAL HIGH
Rough Pigweed  IgE: <0.1 kU/L
Sheep Sorrel IgE: <0.1 kU/L
Timothy Grass: <0.1 kU/L

## 2021-02-24 LAB — INTERPRETATION:

## 2021-02-28 IMAGING — CT CT HEART MORP W/ CTA COR W/ SCORE W/ CA W/CM &/OR W/O CM
4 of 7 series · 8 of 20 positions shown, 9 images · IV contrast (APPLIED)
Comparison: None.

Addendum:
CLINICAL DATA: Chest pain

EXAM:
Cardiac CTA
MEDICATIONS:
Sub lingual nitro. 4mg x 2
TECHNIQUE: The patient was scanned on a Siemens [REDACTED]ice scanner. Gantry
rotation speed was 250 msecs. Collimation was 0.6 mm. A 100 kV
prospective scan was triggered in the ascending thoracic aorta at
35-75% of the R-R interval. Average HR during the scan was 60 bpm.
The 3D data set was interpreted on a dedicated work station using
MPR, MIP and VRT modes. A total of 80cc of contrast was used.

[Series 6: best diast 77 % · axial · 0.39mm/px · z∈[+1169,+1206]mm · 2 of 276 slices shown]
[im 92/276  vessel]
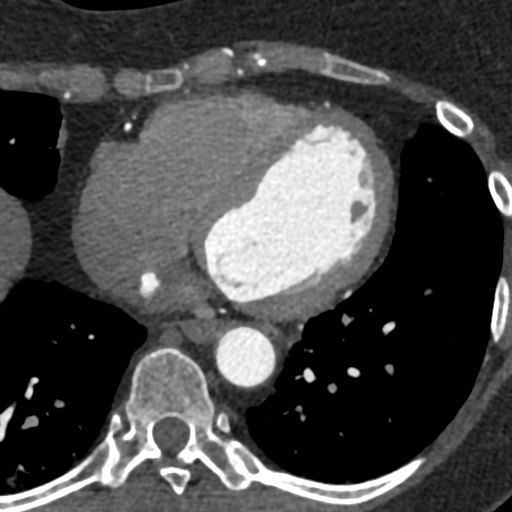
[im 184/276  vessel]
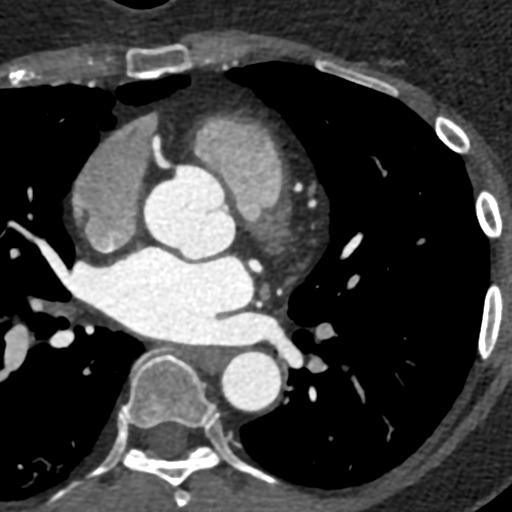

[Series 7: ts diast sharp · axial · 0.39mm/px · z∈[+1169,+1206]mm · 2 of 276 slices shown, 3 images]
[im 92/276  vessel]
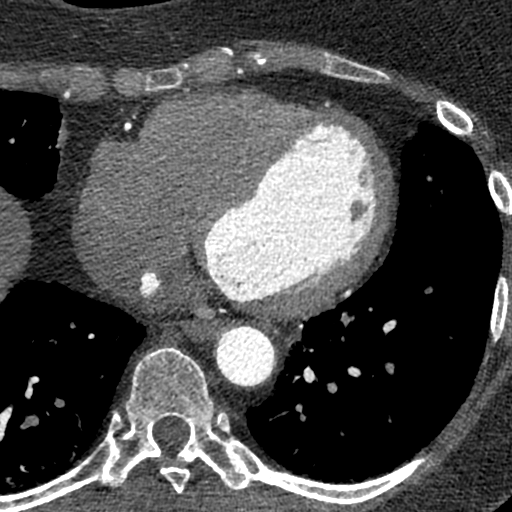
[im 92/276  lung]
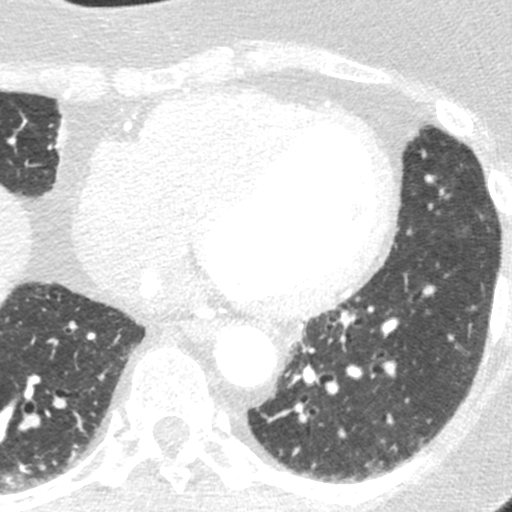
[im 184/276  vessel]
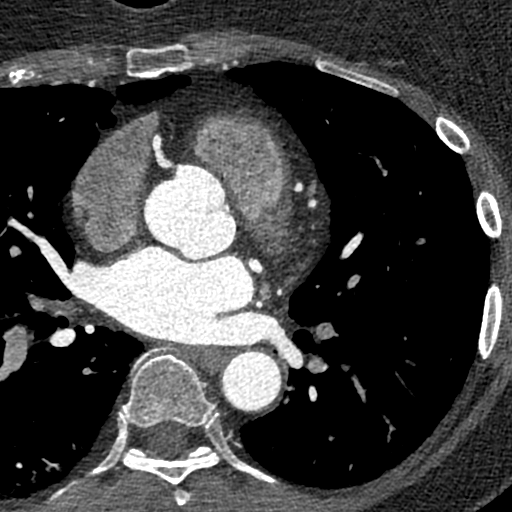

[Series 8: best syst 36 % · axial · 0.39mm/px · z∈[+1169,+1206]mm · 2 of 276 slices shown]
[im 92/276  vessel]
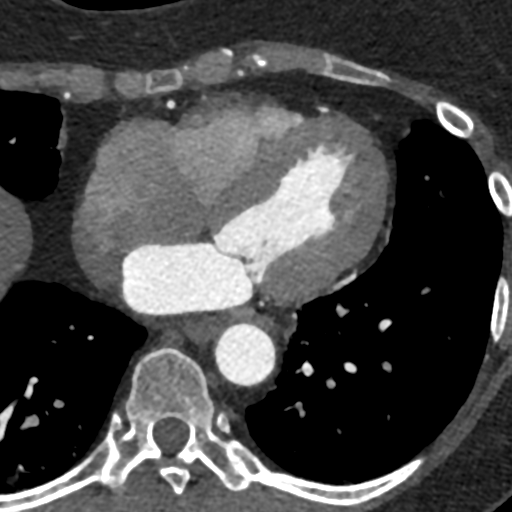
[im 184/276  vessel]
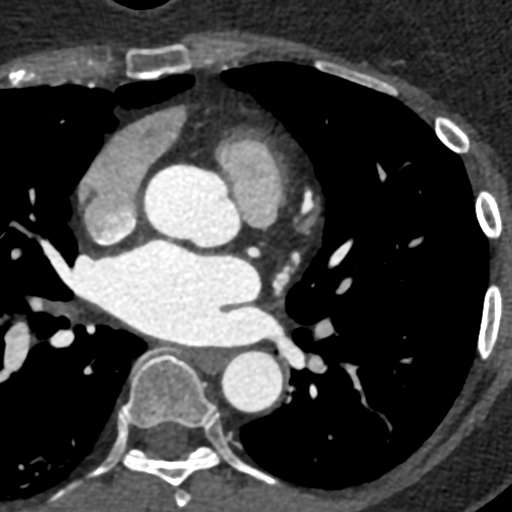

[Series 9: ts syst sharp · axial · 0.39mm/px · z∈[+1169,+1206]mm · 2 of 276 slices shown]
[im 92/276  lung]
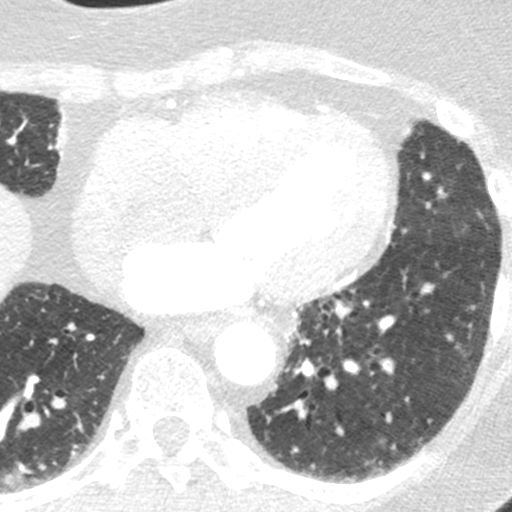
[im 184/276  lung]
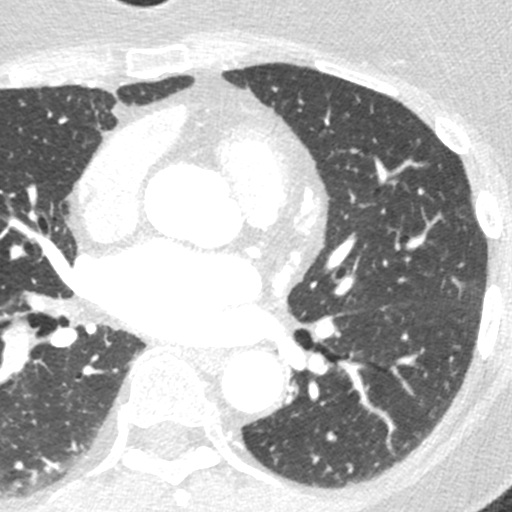

[8 of 20 positions shown; findings below may reference images not displayed]

FINDINGS: Non-cardiac: See separate report from [REDACTED].

Pulmonary veins drain normally to the left atrium. No LA appendage
thrombus.

Calcium Score: 0 Agatston units.

Coronary Arteries: Right dominant with no anomalies

LM: No plaque or stenosis.

LAD system:  No plaque or stenosis.

Circumflex system: No plaque or stenosis.

RCA system:  No plaque or stenosis.
IMPRESSION: 1. Coronary calcium score 0 Agatston units, suggesting low risk for
future cardiac events.

2.  No significant coronary disease noted.

Naamin Sajida

EXAM:
OVER-READ INTERPRETATION  CT CHEST

The following report is an over-read performed by radiologist Dr.
over-read does not include interpretation of cardiac or coronary
anatomy or pathology. The coronary CTA and coronary calcium score
interpretation by the cardiologist is attached.
FINDINGS: Vascular: No central obstructing pulmonary emboli. No pericardial
effusion noted.

Mediastinum/nodes: No mass or adenopathy.

Lungs/pleura: The lung bases appear clear. Calcified subpleural
granuloma noted in the posterior left base. No suspicious lung
nodules identified.

Upper abdomen: No acute abnormality.

Musculoskeletal: No acute or suspicious osseous findings.
IMPRESSION: No active cardiopulmonary abnormalities.  Negative over read

*** End of Addendum ***
FINDINGS: Non-cardiac: See separate report from [REDACTED].

Pulmonary veins drain normally to the left atrium. No LA appendage
thrombus.

Calcium Score: 0 Agatston units.

Coronary Arteries: Right dominant with no anomalies

LM: No plaque or stenosis.

LAD system:  No plaque or stenosis.

Circumflex system: No plaque or stenosis.

RCA system:  No plaque or stenosis.
IMPRESSION: 1. Coronary calcium score 0 Agatston units, suggesting low risk for
future cardiac events.

2.  No significant coronary disease noted.

Naamin Sajida

## 2021-03-18 ENCOUNTER — Other Ambulatory Visit: Payer: Self-pay | Admitting: Allergy & Immunology

## 2021-03-30 DIAGNOSIS — K219 Gastro-esophageal reflux disease without esophagitis: Secondary | ICD-10-CM | POA: Diagnosis not present

## 2021-04-12 DIAGNOSIS — Z0289 Encounter for other administrative examinations: Secondary | ICD-10-CM

## 2021-04-18 MED ORDER — VALACYCLOVIR HCL 500 MG PO TABS: 500.0000 mg | ORAL_TABLET | Freq: Two times a day (BID) | ORAL | 3 refills | Status: DC

## 2021-04-18 MED ORDER — HYDROCHLOROTHIAZIDE 12.5 MG PO CAPS: ORAL_CAPSULE | Freq: Every day | ORAL | 2 refills | Status: DC

## 2021-04-18 NOTE — Addendum Note (Signed)
Addended by: Binnie Rail on: 04/18/2021 06:57 PM   Modules accepted: Orders

## 2021-04-20 ENCOUNTER — Ambulatory Visit (INDEPENDENT_AMBULATORY_CARE_PROVIDER_SITE_OTHER): Payer: Medicare Other | Admitting: Family Medicine

## 2021-04-20 ENCOUNTER — Other Ambulatory Visit: Payer: Self-pay

## 2021-04-20 ENCOUNTER — Encounter (INDEPENDENT_AMBULATORY_CARE_PROVIDER_SITE_OTHER): Payer: Self-pay | Admitting: Family Medicine

## 2021-04-20 VITALS — BP 125/80 | HR 58 | Temp 97.7°F | Ht 65.0 in | Wt 203.0 lb

## 2021-04-20 DIAGNOSIS — E669 Obesity, unspecified: Secondary | ICD-10-CM

## 2021-04-20 DIAGNOSIS — R0602 Shortness of breath: Secondary | ICD-10-CM | POA: Diagnosis not present

## 2021-04-20 DIAGNOSIS — E7849 Other hyperlipidemia: Secondary | ICD-10-CM

## 2021-04-20 DIAGNOSIS — R5383 Other fatigue: Secondary | ICD-10-CM

## 2021-04-20 DIAGNOSIS — I10 Essential (primary) hypertension: Secondary | ICD-10-CM

## 2021-04-20 DIAGNOSIS — R7303 Prediabetes: Secondary | ICD-10-CM | POA: Diagnosis not present

## 2021-04-20 DIAGNOSIS — F39 Unspecified mood [affective] disorder: Secondary | ICD-10-CM

## 2021-04-20 DIAGNOSIS — J4541 Moderate persistent asthma with (acute) exacerbation: Secondary | ICD-10-CM | POA: Diagnosis not present

## 2021-04-20 DIAGNOSIS — K588 Other irritable bowel syndrome: Secondary | ICD-10-CM | POA: Insufficient documentation

## 2021-04-20 DIAGNOSIS — Z6833 Body mass index (BMI) 33.0-33.9, adult: Secondary | ICD-10-CM | POA: Diagnosis not present

## 2021-04-21 LAB — CBC WITH DIFFERENTIAL/PLATELET
Basophils Absolute: 0 10*3/uL (ref 0.0–0.2)
Basos: 1 %
EOS (ABSOLUTE): 0.1 10*3/uL (ref 0.0–0.4)
Eos: 2 %
Hematocrit: 42.4 % (ref 34.0–46.6)
Hemoglobin: 13.8 g/dL (ref 11.1–15.9)
Immature Grans (Abs): 0 10*3/uL (ref 0.0–0.1)
Immature Granulocytes: 0 %
Lymphocytes Absolute: 1.1 10*3/uL (ref 0.7–3.1)
Lymphs: 17 %
MCH: 28 pg (ref 26.6–33.0)
MCHC: 32.5 g/dL (ref 31.5–35.7)
MCV: 86 fL (ref 79–97)
Monocytes Absolute: 0.6 10*3/uL (ref 0.1–0.9)
Monocytes: 9 %
Neutrophils Absolute: 4.6 10*3/uL (ref 1.4–7.0)
Neutrophils: 71 %
Platelets: 278 10*3/uL (ref 150–450)
RBC: 4.92 x10E6/uL (ref 3.77–5.28)
RDW: 12.5 % (ref 11.7–15.4)
WBC: 6.4 10*3/uL (ref 3.4–10.8)

## 2021-04-21 LAB — INSULIN, RANDOM: INSULIN: 11.4 u[IU]/mL (ref 2.6–24.9)

## 2021-04-21 LAB — COMPREHENSIVE METABOLIC PANEL
ALT: 18 IU/L (ref 0–32)
AST: 28 IU/L (ref 0–40)
Albumin/Globulin Ratio: 1.8 (ref 1.2–2.2)
Albumin: 4.3 g/dL (ref 3.8–4.8)
Alkaline Phosphatase: 130 IU/L — ABNORMAL HIGH (ref 44–121)
BUN/Creatinine Ratio: 17 (ref 12–28)
BUN: 13 mg/dL (ref 8–27)
Bilirubin Total: 0.5 mg/dL (ref 0.0–1.2)
CO2: 26 mmol/L (ref 20–29)
Calcium: 9.7 mg/dL (ref 8.7–10.3)
Chloride: 101 mmol/L (ref 96–106)
Creatinine, Ser: 0.77 mg/dL (ref 0.57–1.00)
Globulin, Total: 2.4 g/dL (ref 1.5–4.5)
Glucose: 81 mg/dL (ref 70–99)
Potassium: 3.6 mmol/L (ref 3.5–5.2)
Sodium: 142 mmol/L (ref 134–144)
Total Protein: 6.7 g/dL (ref 6.0–8.5)
eGFR: 86 mL/min/{1.73_m2} (ref >59)

## 2021-04-21 LAB — FOLATE: Folate: >20 ng/mL (ref >3.0)

## 2021-04-21 LAB — HEMOGLOBIN A1C
Est. average glucose Bld gHb Est-mCnc: 117 mg/dL
Hgb A1c MFr Bld: 5.7 % — ABNORMAL HIGH (ref 4.8–5.6)

## 2021-04-21 LAB — LIPID PANEL WITH LDL/HDL RATIO
Cholesterol, Total: 264 mg/dL — ABNORMAL HIGH (ref 100–199)
HDL: 59 mg/dL (ref >39)
LDL Chol Calc (NIH): 168 mg/dL — ABNORMAL HIGH (ref 0–99)
LDL/HDL Ratio: 2.8 ratio (ref 0.0–3.2)
Triglycerides: 200 mg/dL — ABNORMAL HIGH (ref 0–149)
VLDL Cholesterol Cal: 37 mg/dL (ref 5–40)

## 2021-04-21 LAB — TSH: TSH: 4.27 u[IU]/mL (ref 0.450–4.500)

## 2021-04-21 LAB — T4, FREE: Free T4: 0.91 ng/dL (ref 0.82–1.77)

## 2021-04-21 LAB — VITAMIN B12: Vitamin B-12: 1319 pg/mL — ABNORMAL HIGH (ref 232–1245)

## 2021-04-21 LAB — VITAMIN D 25 HYDROXY (VIT D DEFICIENCY, FRACTURES): Vit D, 25-Hydroxy: 29.2 ng/mL — ABNORMAL LOW (ref 30.0–100.0)

## 2021-04-26 NOTE — Progress Notes (Signed)
Chief Complaint:   OBESITY Dana Mccoy (MR# [id number]) is a 66 y.o. female who presents for evaluation and treatment of obesity and related comorbidities. Current BMI is Body mass index is 33.78 kg/m. Kimberlye has been struggling with her weight for many years and has been unsuccessful in either losing weight, maintaining weight loss, or reaching her healthy weight goal.  Dana Mccoy is currently in the action stage of change and ready to dedicate time achieving and maintaining a healthier weight. Dana Mccoy is interested in becoming our patient and working on intensive lifestyle modifications including (but not limited to) diet and exercise for weight loss.  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 She desires a 60 lb weight loss within 8 months. She has tried multiple diets int he past and "non-restrictive ones" work best. Pt craves cereal, nuts. Milk, chips. She drinks caloric beverages.  Dana Mccoy's habits were reviewed today and are as follows: her desired weight loss is 58 lbs, she has been heavy most of her life, she started gaining weight after childbirth, her heaviest weight ever was 217 pounds, she has significant food cravings issues, she snacks frequently in the evenings, she wakes up frequently in the middle of the night to eat, she skips meals frequently, she is frequently drinking liquids with calories, she frequently makes poor food choices, she has problems with excessive hunger, she has binge eating behaviors, and she struggles with emotional eating.  Depression Screen [name]'s Food and Mood (modified PHQ-9) score was 5.  Depression screen PHQ 2/9 04/20/2021  Decreased Interest 1  Down, Depressed, Hopeless 1  PHQ - 2 Score 2  Altered sleeping 0  Tired, decreased energy 1  Change in appetite 1  Feeling bad or failure about yourself  0  Trouble concentrating 1  Moving slowly or fidgety/restless 0  Suicidal thoughts 0  PHQ-9 Score 5  Difficult doing  work/chores Not difficult at all  Some recent data might be hidden   Subjective:   1. Other fatigue Dana Mccoy admits to daytime somnolence and admits to waking up still tired. Patient has a history of symptoms of daytime fatigue, morning fatigue, and morning headache. Dana Mccoy generally gets  7-9  hours of sleep per night, and states that she has generally restful sleep. Snoring "unsure" present. Apneic episodes are present. Epworth Sleepiness Score is 9. Pt denies concerns with sleep apnea or excessive snoring or daytime sleepiness.  2. Shortness of breath on exertion Dana Mccoy notes increasing shortness of breath with exercising and seems to be worsening over time with weight gain. She notes getting out of breath sooner with activity than she used to. This has gotten worse recently. Dana Mccoy denies shortness of breath at rest or orthopnea.  3. Essential hypertension Pt denies headache or dizziness. Medication: HCTZ  4. Moderate persistent reactive airway disease with acute exacerbation Latrease has no issues or concerns today. Medication: Advair, Singulair, Albuterol  5. Mood disorder ([place]) with emotional eating Pt has anxiety and depression. Symptoms mild currently, as PHQ 5. She occasionally eats when she is stressed. Medication: Prozac 20 mg  6. Other hyperlipidemia Pt had side effects with statins 8-10 years ago and has not taken any since. She is managed by PCP.   7. Prediabetes Dx with metabolic syndrome in the past. Pt's A1c was 5.7 several years ago. Medication: None  Assessment/Plan:   1. Other fatigue Dana Mccoy does feel that her weight is causing her energy to be lower  than it should be. Fatigue may be related to obesity, depression or many other causes. Labs will be ordered, and in the meanwhile, Dana Mccoy will focus on self care including making healthy food choices, increasing physical activity and focusing on stress reduction. Check labs today.  - Folate - EKG 12-Lead - Vitamin B12 - T4, free -  TSH - VITAMIN D 25 Hydroxy (Vit-D Deficiency, Fractures)  2. Shortness of breath on exertion Dana Mccoy does feel that she gets out of breath more easily that she used to when she exercises. Dana Mccoy's shortness of breath appears to be obesity related and exercise induced. She has agreed to work on weight loss and gradually increase exercise to treat her exercise induced shortness of breath. Will continue to monitor closely.  3. Essential hypertension At goal. Dana Mccoy is working on healthy weight loss and exercise to improve blood pressure control. We will watch for signs of hypotension as she continues her lifestyle modifications. Check labs today.  - Comprehensive metabolic panel - CBC with Differential/Platelet  4. Moderate persistent reactive airway disease with acute exacerbation Symptoms stable. Continue current treatment plan.  5. Mood disorder ([place]) with emotional eating Behavior modification techniques were discussed today to help Dana Mccoy deal with her emotional/non-hunger eating behaviors.  Orders and follow up as documented in patient record. Pt declines need for counseling or referral for CBT at this time.  6. Other hyperlipidemia Cardiovascular risk and specific lipid/LDL goals reviewed.  We discussed several lifestyle modifications today and Dana Mccoy will continue to work on diet, exercise and weight loss efforts. Orders and follow up as documented in patient record.   Counseling Intensive lifestyle modifications are the first line treatment for this issue. Dietary changes: Increase soluble fiber. Decrease simple carbohydrates. Exercise changes: Moderate to vigorous-intensity aerobic activity 150 minutes per week if tolerated. Lipid-lowering medications: see documented in medical record. Check labs today.  - Lipid Panel With LDL/HDL Ratio  7. Prediabetes Focus on prudent nutritional plan, decrease simple carbs, and increase protein intake- engage in weight loss. Discussed labs with patient  today.  - Hemoglobin A1c - Insulin, random  8. Obesity with current BMI of 33.9 Dana Mccoy is currently in the action stage of change and her goal is to continue with weight loss efforts. I recommend Pina begin the structured treatment plan as follows:  She has agreed to the Category 2 Plan with breakfast options.  Exercise goals:  As is    Behavioral modification strategies: no skipping meals and planning for success.  She was informed of the importance of frequent follow-up visits to maximize her success with intensive lifestyle modifications for her multiple health conditions. She was informed we would discuss her lab results at her next visit unless there is a critical issue that needs to be addressed sooner. Hila agreed to keep her next visit at the agreed upon time to discuss these results.  Objective:   Blood pressure 125/80, pulse (!) 58, temperature 97.7 F (36.5 C), height 5\' 5"  (1.651 m), weight 203 lb (92.1 kg), SpO2 95 %. Body mass index is 33.78 kg/m.  EKG: Abnormal-sinus rhythm, rate 56.  Indirect Calorimeter completed today shows a VO2 of 243 and a REE of 1670.  Her calculated basal metabolic rate is 3235 thus her basal metabolic rate is better than expected.  General: Cooperative, alert, well developed, in no acute distress. HEENT: Conjunctivae and lids unremarkable. Cardiovascular: Regular rhythm.  Lungs: Normal work of breathing. Neurologic: No focal deficits.   Lab Results  Component Value  Date   CREATININE 0.77 04/20/2021   BUN 13 04/20/2021   NA 142 04/20/2021   K 3.6 04/20/2021   CL 101 04/20/2021   CO2 26 04/20/2021   Lab Results  Component Value Date   ALT 18 04/20/2021   AST 28 04/20/2021   ALKPHOS 130 (H) 04/20/2021   BILITOT 0.5 04/20/2021   Lab Results  Component Value Date   HGBA1C 5.7 (H) 04/20/2021   HGBA1C 5.5 09/17/2015   HGBA1C 5.7 01/24/2006   Lab Results  Component Value Date   INSULIN 11.4 04/20/2021   Lab Results  Component  Value Date   TSH 4.270 04/20/2021   Lab Results  Component Value Date   CHOL 264 (H) 04/20/2021   HDL 59 04/20/2021   LDLCALC 168 (H) 04/20/2021   LDLDIRECT 150.0 09/06/2019   TRIG 200 (H) 04/20/2021   CHOLHDL 5 09/06/2019   Lab Results  Component Value Date   WBC 6.4 04/20/2021   HGB 13.8 04/20/2021   HCT 42.4 04/20/2021   MCV 86 04/20/2021   PLT 278 04/20/2021   Lab Results  Component Value Date   IRON 62 09/06/2019   TIBC 401 09/06/2019   FERRITIN 16 09/06/2019   Obesity Behavioral Intervention:   Approximately 15 minutes were spent on the discussion below.  ASK: We discussed the diagnosis of obesity with Lattie Haw today and Serenitie agreed to give [name] permission to discuss obesity behavioral modification therapy today.  ASSESS: Ranesha has the diagnosis of obesity and her BMI today is 33.9. Venia is in the action stage of change.   ADVISE: Violeta was educated on the multiple health risks of obesity as well as the benefit of weight loss to improve her health. She was advised of the need for long term treatment and the importance of lifestyle modifications to improve her current health and to decrease her risk of future health problems.  AGREE: Multiple dietary modification options and treatment options were discussed and Celestine agreed to follow the recommendations documented in the above note.  ARRANGE: Tuwana was educated on the importance of frequent visits to treat obesity as outlined per CMS and USPSTF guidelines and agreed to schedule her next follow up appointment today.  Attestation Statements:   Reviewed by clinician on day of visit: allergies, medications, problem list, medical history, surgical history, family history, social history, and previous encounter notes.  Coral Ceo, CMA, am acting as transcriptionist for [employer], DO.  I have reviewed the above documentation for accuracy and completeness, and I agree with the above. Marjory Sneddon,  D.O.  The [place] was signed into law in 2016 which includes the topic of electronic health records.  This provides immediate access to information in MyChart.  This includes consultation notes, operative notes, office notes, lab results and pathology reports.  If you have any questions about what you read please let us know at your next visit so we can discuss your concerns and take corrective action if need be.  We are right here with you.

## 2021-04-29 ENCOUNTER — Encounter: Payer: Self-pay | Admitting: Allergy & Immunology

## 2021-04-29 ENCOUNTER — Ambulatory Visit: Payer: Medicare Other | Admitting: Allergy & Immunology

## 2021-04-29 ENCOUNTER — Other Ambulatory Visit: Payer: Self-pay

## 2021-04-29 VITALS — BP 118/72 | HR 90 | Temp 98.6°F | Resp 16 | Ht 65.0 in | Wt 203.5 lb

## 2021-04-29 DIAGNOSIS — T7840XD Allergy, unspecified, subsequent encounter: Secondary | ICD-10-CM

## 2021-04-29 DIAGNOSIS — K219 Gastro-esophageal reflux disease without esophagitis: Secondary | ICD-10-CM

## 2021-04-29 DIAGNOSIS — J4540 Moderate persistent asthma, uncomplicated: Secondary | ICD-10-CM

## 2021-04-29 DIAGNOSIS — T783XXD Angioneurotic edema, subsequent encounter: Secondary | ICD-10-CM

## 2021-04-29 NOTE — Progress Notes (Signed)
FOLLOW UP  Date of Service/Encounter:  04/29/21   Assessment:   Angioedema - considering addition of Xolair to help with both angioedema and asthma (IgE level of 144 in November 2022)   Allergic rhinitis (trees, ragweed)   Moderate persistent asthma, uncomplicated - managed by Pulmonology   GERD  Plan/Recommendations:   1. Moderate persistent reactive airway disease without complication - Lung testing not done today. - It seems that Dr. Shearon Stalls has a good handle on your symptoms. - I like the idea of Xolair (anti-IgE injection) since that can help to decrease anaphylaxis episodes. - Information on Xolair provided today.  - Daily controller medication(s): Advair 230/11mcg two puffs twice daily with spacer - Prior to physical activity: albuterol 2 puffs 10-15 minutes before physical activity. - Rescue medications: albuterol 4 puffs every 4-6 hours as needed - Asthma control goals:  * Full participation in all desired activities (may need albuterol before activity) * Albuterol use two time or less a week on average (not counting use with activity) * Cough interfering with sleep two time or less a month * Oral steroids no more than once a year * No hospitalizations  2. Gastroesophageal reflux disease - Continue with pantoprazole 40mg  twice daily.  3. Allergic reaction - unknown trigger - EpiPen is up to date.  - Continue with Claritin 10mg  twice daily as you are doing.  4. Return in about 6 months (around 10/27/2021).   Subjective:   Dana Mccoy is a 66 y.o. female presenting today for follow up of  Chief Complaint  Patient presents with   Follow-up    Dana Mccoy has a history of the following: Patient Active Problem List   Diagnosis Date Noted   Prediabetes 04/20/2021   Essential hypertension 04/20/2021   Other irritable bowel syndrome 04/20/2021   Acute bronchitis with bronchospasm 11/11/2020   Acute cystitis 08/17/2020   Asthma  exacerbation, mild 02/12/2020   Adverse food reaction 01/30/2020   Angio-edema 01/17/2020   Frequent headaches 01/17/2020   DDD (degenerative disc disease), cervical 09/24/2019   Numbness and tingling 09/06/2019   Low iron 09/06/2019   Tremor 09/06/2019   Restless leg syndrome 07/27/2018   Chronic pain of left knee 04/17/2018   Right low back pain 04/17/2018   Fatigue 08/21/2017   Bradycardia 08/21/2017   Atypical chest pain 08/21/2017   Anxiety 12/08/2013   Obesity (BMI 30-39.9) 08/02/2013   Other allergic rhinitis 08/02/2013   Acute bronchitis 04/02/2013   Cervical stenosis of spine 04/26/2011   Hyperlipidemia 06/05/2007   Reactive airway disease 06/05/2007   Gastroesophageal reflux disease 06/05/2007    History obtained from: chart review and patient.  Dana Mccoy is a 66 y.o. female presenting for a follow up visit.  She was last seen in August 2022.  At that time, her lung testing looked normal. We continued with Breo one puff once daily. For her GERD, we continued with pantoprazole 40mg  daily. She had not experienced any anphylaxis episodes.   Since the last visit, she has done well. She developed some shortness of breathing and coughing a few weeks after we saw her in August 2022.  Asthma/Respiratory Symptom History: Her O2 sats go down to 90% when she sits down. She is no longer on Breo and is now on Advair two puffs BID.  She had an asthma exacerbation back in the fall and she was treated for bronchitis. She was treated on azithromycin which did not help. Then she got ot the point  where she could not breathe. She was then sent to [facility]. She did a lot of scans and was diagnosed with atelectasis. She was changed to the Advair 230/21 two puffs twice daily. She is unsure whether the coughing at night improved with the Advair. This cough at night tends to be dry.   Allergic Rhinitis Symptom History: She remains on the montelukast. This is what really helped her out a  lot. She does cough at night.  She had the environmental allergy testing that was positive to trees as well as ragweed. She never did skin testing for environmental allergies because we originally saw her for swelling only. She had an IgE environmental allergy panel that  demonstrated positives to trees and ragweed in December 2022.   Food Allergy Symptom History: She has not had any additional reactions. Currently she is not actively avoiding anything. She is trying to eat items with less fat. She started going to the [facility] for two months.  EpiPen is up to date. She has put tree nuts back into her diet.   Otherwise, there have been no changes to her past medical history, surgical history, family history, or social history.    Review of Systems  Constitutional: Negative.  Negative for chills, fever, malaise/fatigue and weight loss.  HENT: Negative.  Negative for congestion, ear discharge and ear pain.   Eyes:  Negative for pain, discharge and redness.  Respiratory:  Negative for cough, sputum production, shortness of breath and wheezing.   Cardiovascular: Negative.  Negative for chest pain and palpitations.  Gastrointestinal:  Negative for abdominal pain, blood in stool, constipation, diarrhea, heartburn, nausea and vomiting.  Skin: Negative.  Negative for itching and rash.  Neurological:  Negative for dizziness and headaches.  Endo/Heme/Allergies:  Negative for environmental allergies. Does not bruise/bleed easily.      Objective:   Blood pressure 118/72, pulse 90, temperature 98.6 F (37 C), resp. rate 16, height 5\' 5"  (1.651 m), weight 203 lb 8 oz (92.3 kg), SpO2 95 %. Body mass index is 33.86 kg/m.   Physical Exam:  Physical Exam Vitals reviewed.  Constitutional:      Appearance: She is well-developed.     Comments: Talkative female.  Cooperative with the exam. Lovely.  HENT:     Head: Normocephalic and atraumatic.     Right Ear: Tympanic membrane, ear  canal and external ear normal.     Left Ear: Tympanic membrane, ear canal and external ear normal.     Nose: No nasal deformity, septal deviation, mucosal edema or rhinorrhea.     Right Turbinates: Enlarged and swollen.     Left Turbinates: Enlarged and swollen.     Right Sinus: No maxillary sinus tenderness or frontal sinus tenderness.     Left Sinus: No maxillary sinus tenderness or frontal sinus tenderness.     Comments: No nasal polyps noted.     Mouth/Throat:     Mouth: Mucous membranes are not pale and not dry.     Pharynx: Uvula midline.  Eyes:     General: Lids are normal. No allergic shiner.       Right eye: No discharge.        Left eye: No discharge.     Conjunctiva/sclera: Conjunctivae normal.     Right eye: Right conjunctiva is not injected. No chemosis.    Left eye: Left conjunctiva is not injected. No chemosis.    Pupils: Pupils are equal, round, and reactive to  light.  Cardiovascular:     Rate and Rhythm: Normal rate and regular rhythm.     Heart sounds: Normal heart sounds.  Pulmonary:     Effort: Pulmonary effort is normal. No tachypnea, accessory muscle usage or respiratory distress.     Breath sounds: Normal breath sounds. No wheezing, rhonchi or rales.  Chest:     Chest wall: No tenderness.  Lymphadenopathy:     Cervical: No cervical adenopathy.  Skin:    General: Skin is warm.     Capillary Refill: Capillary refill takes less than 2 seconds.     Coloration: Skin is not pale.     Findings: No abrasion, erythema, petechiae or rash. Rash is not papular, urticarial or vesicular.     Comments: No eczematous or urticarial lesions noted. No angioedema.   Neurological:     Mental Status: She is alert.  Psychiatric:        Behavior: Behavior is cooperative.     Diagnostic studies: none      Salvatore Marvel, MD  [facility]

## 2021-04-29 NOTE — Patient Instructions (Addendum)
1. Moderate persistent reactive airway disease without complication - Lung testing not done today. - It seems that Dr. Shearon Stalls has a good handle on your symptoms. - I like the idea of Xolair (anti-IgE injection) since that can help to decrease anaphylaxis episodes. - Information on Xolair provided today.  - Daily controller medication(s): Advair 230/79mcg two puffs twice daily with spacer - Prior to physical activity: albuterol 2 puffs 10-15 minutes before physical activity. - Rescue medications: albuterol 4 puffs every 4-6 hours as needed - Asthma control goals:  * Full participation in all desired activities (may need albuterol before activity) * Albuterol use two time or less a week on average (not counting use with activity) * Cough interfering with sleep two time or less a month * Oral steroids no more than once a year * No hospitalizations  2. Gastroesophageal reflux disease - Continue with pantoprazole 40mg  twice daily.  3. Allergic reaction - unknown trigger - EpiPen is up to date.  - Continue with Claritin 10mg  twice daily as you are doing.  4. Return in about 6 months (around 10/27/2021).   Please inform us of any Emergency Department visits, hospitalizations, or changes in symptoms. Call us before going to the ED for breathing or allergy symptoms since we might be able to fit you in for a sick visit. Feel free to contact us anytime with any questions, problems, or concerns.  It was a pleasure to see you again today! Enjoy your trip!  Websites that have reliable patient information: 1. American Academy of Asthma, Allergy, and Immunology: www.aaaai.org 2. Food Allergy Research and Education (FARE): foodallergy.org 3. Mothers of Asthmatics: http://www.asthmacommunitynetwork.org 4. American College of Allergy, Asthma, and Immunology: www.acaai.org   COVID-19 Vaccine Information can be found at: ShippingScam.co.uk For  questions related to vaccine distribution or appointments, please email vaccine@[place] .[email or web address] or call [phone].   We realize that you might be concerned about having an allergic reaction to the COVID19 vaccines. To help with that concern, WE ARE OFFERING THE COVID19 VACCINES IN OUR OFFICE! Ask the front desk for dates!     Like [name] on [employer] and Instagram for our latest updates!      A healthy democracy works best when [employer] participate! Make sure you are registered to vote! If you have moved or changed any of your contact information, you will need to get this updated before voting!  In some cases, you MAY be able to register to vote online: [email or web address]

## 2021-04-30 ENCOUNTER — Other Ambulatory Visit: Payer: Self-pay | Admitting: Internal Medicine

## 2021-04-30 DIAGNOSIS — Z1231 Encounter for screening mammogram for malignant neoplasm of breast: Secondary | ICD-10-CM

## 2021-05-02 ENCOUNTER — Encounter: Payer: Self-pay | Admitting: Allergy & Immunology

## 2021-05-04 ENCOUNTER — Ambulatory Visit (INDEPENDENT_AMBULATORY_CARE_PROVIDER_SITE_OTHER): Payer: Medicare Other | Admitting: Family Medicine

## 2021-05-04 ENCOUNTER — Other Ambulatory Visit: Payer: Self-pay

## 2021-05-04 ENCOUNTER — Other Ambulatory Visit (INDEPENDENT_AMBULATORY_CARE_PROVIDER_SITE_OTHER): Payer: Self-pay | Admitting: Family Medicine

## 2021-05-04 ENCOUNTER — Encounter (INDEPENDENT_AMBULATORY_CARE_PROVIDER_SITE_OTHER): Payer: Self-pay | Admitting: Family Medicine

## 2021-05-04 VITALS — BP 121/76 | HR 68 | Temp 98.1°F | Ht 65.0 in | Wt 199.0 lb

## 2021-05-04 DIAGNOSIS — E559 Vitamin D deficiency, unspecified: Secondary | ICD-10-CM | POA: Diagnosis not present

## 2021-05-04 DIAGNOSIS — E669 Obesity, unspecified: Secondary | ICD-10-CM

## 2021-05-04 DIAGNOSIS — E7849 Other hyperlipidemia: Secondary | ICD-10-CM

## 2021-05-04 DIAGNOSIS — R7303 Prediabetes: Secondary | ICD-10-CM

## 2021-05-04 DIAGNOSIS — I10 Essential (primary) hypertension: Secondary | ICD-10-CM

## 2021-05-04 DIAGNOSIS — Z6833 Body mass index (BMI) 33.0-33.9, adult: Secondary | ICD-10-CM

## 2021-05-04 MED ORDER — VITAMIN D (ERGOCALCIFEROL) 1.25 MG (50000 UNIT) PO CAPS: 50000.0000 [IU] | ORAL_CAPSULE | ORAL | 0 refills | Status: DC

## 2021-05-04 NOTE — Patient Instructions (Signed)
The 10-year ASCVD risk score (Arnett DK, et al., 2019) is: 7.5%   Values used to calculate the score:     Age: 66 years     Sex: Female     Is Non-Hispanic African American: No     Diabetic: No     Tobacco smoker: No     Systolic Blood Pressure: 856 mmHg     Is BP treated: Yes     HDL Cholesterol: 59 mg/dL     Total Cholesterol: 264 mg/dL

## 2021-05-04 NOTE — Telephone Encounter (Signed)
Pt seen today and is requesting a 90 day supply for Vitamin D. Thank you

## 2021-05-04 NOTE — Telephone Encounter (Signed)
Dr.Opalski ?

## 2021-05-05 NOTE — Telephone Encounter (Signed)
Pt advised.

## 2021-05-05 NOTE — Progress Notes (Signed)
Chief Complaint:   OBESITY Dana Mccoy is here to discuss her progress with her obesity treatment plan along with follow-up of her obesity related diagnoses. Dana Mccoy is on the Category 2 Plan with breakfast options and states she is following her eating plan approximately 95% of the time. [name] states she is walking for 60 minutes 3 times per week.  Today's visit was #: 2 Starting weight: 203 lbs Starting date: 04/20/2021 Today's weight: 199 lbs Today's date: 05/04/2021 Total lbs lost to date: 4 Total lbs lost since last in-office visit: 4  Interim History: Dana Mccoy is here today for her first follow-up office visit since starting the program with Dana Mccoy. All blood work/ lab tests that were recently ordered by myself or an outside provider were reviewed with patient today per their request. Extended time was spent counseling her on all new disease processes that were discovered or preexisting ones that are affected by BMI. She understands that many of these abnormalities will need to monitored regularly along with the current treatment plan of prudent dietary changes, in which we are making each and every office visit, to improve these health parameters.  We reviewed her meal plan and questions were answered. Patient's food recall appears to be accurate and consistent with what is on plan when she is following it. When eating on plan, her hunger and cravings are well controlled. Snacks = Yasso bars and almonds, but not always measuring amounts.   Subjective:   1. Pre-diabetes Donald has a diagnosis of pre-diabetes based on her elevated HgA1c, as well as her fasting insulin. She is not on medications. She was informed this puts her at greater risk of developing diabetes. She continues to work on diet and exercise to decrease her risk of diabetes. She denies nausea or hypoglycemia. I discussed labs with the patient today.  2. Essential hypertension Dana Mccoy denies headache, dizziness, or vision  changes. Her blood pressure is at goal. I discussed labs with the patient today.  BP Readings from Last 3 Encounters:  05/04/21 121/76  04/29/21 118/72  04/20/21 125/80   3. Other hyperlipidemia with incease in HDL and Triglycerides Dana Mccoy had side effects to statin 8-10 years ago.   - Pt's EMR and previous notes were reviewed.   - She was seen by Cardiology within the last 1-2 yrs, and had a negative coronary calcium Ct scan- which I reviewed from 12/06/19. Results showed: Coronary calcium score 0 Agatston units.   She was told no need for statins at that time. One year and a half ago her LDL was 150 on 09/06/2019 today it is worse at 168.  I discussed labs with the patient today in extensive detail.   The 10-year ASCVD risk score (Arnett DK, et al., 2019) is: 7.5%   Values used to calculate the score:     Age: 81 years     Sex: Female     Is Non-Hispanic African American: No     Diabetic: No     Tobacco smoker: No     Systolic Blood Pressure: 324 mmHg     Is BP treated: Yes     HDL Cholesterol: 59 mg/dL     Total Cholesterol: 264 mg/dL   4. Vitamin D deficiency Dana Mccoy has a new diagnosis of Vitamin D deficiency. She takes multivitamins and 1 calcium plus Vit D supplementations of 600-800 IU daily. Last bone density test was 2 years ago, and was within normal limits. I discussed labs with  the patient today.    Assessment/Plan:   Meds ordered this encounter  Medications   Vitamin D, Ergocalciferol, (DRISDOL) 1.25 MG (50000 UNIT) CAPS capsule    Sig: Take 1 capsule (50,000 Units total) by mouth every 7 (seven) days.    Dispense:  4 capsule    Refill:  0    30 d supply;  ** OV for RF **   Do not send RF request     1. Pre-diabetes Worsening A1c from prior at 5.5, now 5.7. Dana Mccoy will continue to work on achieving weight loss to improve her A1c. - I counseled patient on pathophysiology of the disease process of IR and Pre-DM.  - Stressed importance of dietary and lifestyle  modifications to result in weight loss as first line txmnt - in addition we discussed the risks and benefits of medications which can help Dana Mccoy in the management of this disease process as well as with weight loss.  Will consider starting one of these meds in future, as we will focus on prudent nutritional plan at this time.  - continue to decrease simple carbs; increase fiber and proteins -> follow meal plan  - handouts provided on PRE-DM after education provided.  All concerns/questions addressed.   - anticipatory guidance given.   - We will recheck A1c and fasting insulin level in approximately 3 months from last check, or as deemed appropriate.   2. Essential hypertension BP at goal. [name] will continue her HCTZ and with her prudent nutritional plan with low salt to improve blood pressure control. We will watch for signs of hypotension as she continues her lifestyle modifications.  3. Other hyperlipidemia with incease in HDL and Triglycerides Worse from prior.   Elevated LDL and TG's.   Dana Mccoy meets the criteria with 10 year risk at 7.5% to go on medications.  However, she will follow up with her primary care physician in regards to if she recommends medications or not. ( She will discuss with pcp esp considering her statin hx, her CT scan results etc).    - I discussed the risks and benefits of medications with pt as well -  I stressed the importance that she continue her prudent nutritional plan with low saturated and trans fats, and low fatty carbs to improve these numbers  4. Vitamin D deficiency Not at goal Dana Mccoy agreed to start prescription Vitamin D 50,000 IU every week with no refills. She will continue OTC calcium plus Vit D and multivitamins. We will recheck labs in 3 months. - I discussed the importance of vitamin D to the patient's health and well-being.  - I reviewed possible symptoms of low Vitamin D:  low energy, depressed mood, muscle aches, joint aches, osteoporosis etc. was  reviewed with patient - low Vitamin D levels may be linked to an increased risk of cardiovascular events and even increased risk of cancers- such as colon and breast.  - ideal vitamin D levels reviewed with patient  - I recommend pt take a weekly prescription vit D - see script below   - Informed patient this may be a lifelong thing, and she was encouraged to continue to take the medicine until told otherwise.    - weight loss will likely improve availability of vitamin D, thus encouraged Yetzali to continue with meal plan and their weight loss efforts to further improve this condition.  Thus, we will need to monitor levels regularly (every 3-4 mo on average) to keep levels within normal limits and prevent  over supplementation. - pt's questions and concerns regarding this condition addressed.  - Vitamin D, Ergocalciferol, (DRISDOL) 1.25 MG (50000 UNIT) CAPS capsule; Take 1 capsule (50,000 Units total) by mouth every 7 (seven) days.  Dispense: 4 capsule; Refill: 0   5. Obesity with current BMI of 33.2 Janiyah is currently in the action stage of change. As such, her goal is to continue with weight loss efforts. She has agreed to the Category 2 Plan with breakfast options.   Exercise goals: As is.  Behavioral modification strategies: increasing lean protein intake, decreasing simple carbohydrates, avoiding temptations, and planning for success.  Christyanna has agreed to follow-up with our clinic in 2 to 3 weeks. She was informed of the importance of frequent follow-up visits to maximize her success with intensive lifestyle modifications for her multiple health conditions.   Objective:   Blood pressure 121/76, pulse 68, temperature 98.1 F (36.7 C), temperature source Oral, height 5\' 5"  (1.651 m), weight 199 lb (90.3 kg), SpO2 96 %. Body mass index is 33.12 kg/m.  General: Cooperative, alert, well developed, in no acute distress. HEENT: Conjunctivae and lids unremarkable. Cardiovascular: Regular rhythm.   Lungs: Normal work of breathing. Neurologic: No focal deficits.   Lab Results  Component Value Date   CREATININE 0.77 04/20/2021   BUN 13 04/20/2021   NA 142 04/20/2021   K 3.6 04/20/2021   CL 101 04/20/2021   CO2 26 04/20/2021   Lab Results  Component Value Date   ALT 18 04/20/2021   AST 28 04/20/2021   ALKPHOS 130 (H) 04/20/2021   BILITOT 0.5 04/20/2021   Lab Results  Component Value Date   HGBA1C 5.7 (H) 04/20/2021   HGBA1C 5.5 09/17/2015   HGBA1C 5.7 01/24/2006   Lab Results  Component Value Date   INSULIN 11.4 04/20/2021   Lab Results  Component Value Date   TSH 4.270 04/20/2021   Lab Results  Component Value Date   CHOL 264 (H) 04/20/2021   HDL 59 04/20/2021   LDLCALC 168 (H) 04/20/2021   LDLDIRECT 150.0 09/06/2019   TRIG 200 (H) 04/20/2021   CHOLHDL 5 09/06/2019   Lab Results  Component Value Date   VD25OH 29.2 (L) 04/20/2021   Lab Results  Component Value Date   WBC 6.4 04/20/2021   HGB 13.8 04/20/2021   HCT 42.4 04/20/2021   MCV 86 04/20/2021   PLT 278 04/20/2021   Lab Results  Component Value Date   IRON 62 09/06/2019   TIBC 401 09/06/2019   FERRITIN 16 09/06/2019   Attestation Statements:   Reviewed by clinician on day of visit: allergies, medications, problem list, medical history, surgical history, family history, social history, and previous encounter notes.  Time spent on visit including pre-visit chart review and post-visit care and charting was 45 minutes.    Wilhemena Durie, am acting as transcriptionist for [employer], DO.  I have reviewed the above documentation for accuracy and completeness, and I agree with the above. Marjory Sneddon, D.O.  The [place] was signed into law in 2016 which includes the topic of electronic health records.  This provides immediate access to information in MyChart.  This includes consultation notes, operative notes, office notes, lab results and pathology reports.  If you  have any questions about what you read please let Dana Mccoy know at your next visit so we can discuss your concerns and take corrective action if need be.  We are right here with you.

## 2021-05-11 ENCOUNTER — Ambulatory Visit
Admission: RE | Admit: 2021-05-11 | Discharge: 2021-05-11 | Disposition: A | Discharge status: Home / Self Care | Payer: Medicare Other | Source: Ambulatory Visit | Attending: Internal Medicine | Admitting: Internal Medicine

## 2021-05-11 ENCOUNTER — Other Ambulatory Visit: Payer: Self-pay

## 2021-05-11 DIAGNOSIS — Z1231 Encounter for screening mammogram for malignant neoplasm of breast: Secondary | ICD-10-CM | POA: Diagnosis not present

## 2021-05-17 ENCOUNTER — Telehealth: Payer: Self-pay | Admitting: *Deleted

## 2021-05-17 NOTE — Telephone Encounter (Signed)
Spoke to patient and advised that Dr Ernst Bowler wanted to start her on Xolair. Will mail app for patient assistance due to her [facility] coverage ?

## 2021-05-17 NOTE — Telephone Encounter (Signed)
-----   Message from Valentina Shaggy, MD sent at 05/02/2021  9:29 AM EST ----- ?Discussed starting Xolair for management of asthma and prevention of angioedema episodes.  ?

## 2021-05-17 NOTE — Telephone Encounter (Signed)
L/m for patient to contact me to discuss Xolair through patient assistance ?

## 2021-05-24 NOTE — Progress Notes (Deleted)
? ? ?  Subjective:  ? ? Patient ID: Dana Mccoy, female    DOB: 11/28/1955, 66 y.o.   MRN: [id number] ? ?This visit occurred during the SARS-CoV-2 public health emergency.  Safety protocols were in place, including screening questions prior to the visit, additional usage of staff PPE, and extensive cleaning of exam room while observing appropriate contact time as indicated for disinfecting solutions. ? ? ? ?HPI ?Taylr is here for No chief complaint on file. ? ? ? ? ? ? ? ?Medications and allergies reviewed with patient and updated if appropriate. ? ?Current Outpatient Medications on File Prior to Visit  ?Medication Sig Dispense Refill  ? acetaminophen (TYLENOL) 500 MG tablet Take 500 mg by mouth as needed.    ? albuterol (VENTOLIN HFA) 108 (90 Base) MCG/ACT inhaler INHALE 1-2 PUFFS INTO THE LUNGS EVERY 4 HOURS AS NEEDED FOR WHEEZING OR SHORTNESS OF BREATH. 18 each 1  ? Calcium Carbonate-Vit D-Min (CALCIUM 600+D PLUS MINERALS) 600-400 MG-UNIT CHEW Chew 1 each by mouth daily.    ? EPINEPHrine 0.3 mg/0.3 mL IJ SOAJ injection Inject 0.3 mg into the muscle as needed for anaphylaxis. 1 each 1  ? FLUoxetine (PROZAC) 20 MG capsule TAKE 1 CAPSULE BY MOUTH EVERY DAY 90 capsule 0  ? fluticasone (VERAMYST) 27.5 MCG/SPRAY nasal spray Place 2 sprays into the nose daily.    ? fluticasone-salmeterol (ADVAIR HFA) 230-21 MCG/ACT inhaler Inhale 2 puffs into the lungs 2 (two) times daily. 1 each 12  ? hydrochlorothiazide (MICROZIDE) 12.5 MG capsule TAKE 1 CAPSULE BY MOUTH ONCE DAILY 90 capsule 2  ? LACTASE ENZYME PO Take 1 each by mouth as needed.    ? loratadine (CLARITIN) 10 MG tablet Take 1 tablet (10 mg total) by mouth 2 (two) times daily. 60 tablet 5  ? mometasone (ELOCON) 0.1 % cream mometasone 0.1 % topical cream ? APPLY A THIN LAYER TO THE AFFECTED AREA OF THE SKIN DAILY    ? montelukast (SINGULAIR) 10 MG tablet Take 1 tablet (10 mg total) by mouth at bedtime. 30 tablet 11  ? Multiple Vitamin (MULTIVITAMIN) tablet Take 1  tablet by mouth daily.    ? pantoprazole (PROTONIX) 40 MG tablet Take 40 mg by mouth 2 (two) times daily.    ? valACYclovir (VALTREX) 500 MG tablet Take 1 tablet (500 mg total) by mouth 2 (two) times daily. 180 tablet 3  ? vitamin C (ASCORBIC ACID) 500 MG tablet Take 500 mg by mouth daily.    ? Vitamin D, Ergocalciferol, (DRISDOL) 1.25 MG (50000 UNIT) CAPS capsule Take 1 capsule (50,000 Units total) by mouth every 7 (seven) days. 4 capsule 0  ? [DISCONTINUED] omeprazole (PRILOSEC) 40 MG capsule TAKE 1 CAPSULE BY MOUTH ONCE DAILY 90 capsule 0  ? ?No current facility-administered medications on file prior to visit.  ? ? ?Review of Systems ? ?   ?Objective:  ?There were no vitals filed for this visit. ?BP Readings from Last 3 Encounters:  ?05/04/21 121/76  ?04/29/21 118/72  ?04/20/21 125/80  ? ?Wt Readings from Last 3 Encounters:  ?05/04/21 199 lb (90.3 kg)  ?04/29/21 203 lb 8 oz (92.3 kg)  ?04/20/21 203 lb (92.1 kg)  ? ?There is no height or weight on file to calculate BMI. ? ?  ?Physical Exam ?   ? ? ? ? ? ?Assessment & Plan:  ? ? ?See Problem List for Assessment and Plan of chronic medical problems.  ? ? ? ? ?

## 2021-05-25 ENCOUNTER — Ambulatory Visit (INDEPENDENT_AMBULATORY_CARE_PROVIDER_SITE_OTHER): Payer: Medicare Other | Admitting: Internal Medicine

## 2021-05-25 ENCOUNTER — Encounter: Payer: Self-pay | Admitting: Internal Medicine

## 2021-05-25 ENCOUNTER — Other Ambulatory Visit: Payer: Self-pay

## 2021-05-25 VITALS — BP 130/72 | HR 72 | Temp 97.7°F | Ht 65.0 in | Wt 200.6 lb

## 2021-05-25 DIAGNOSIS — Z0000 Encounter for general adult medical examination without abnormal findings: Secondary | ICD-10-CM | POA: Diagnosis not present

## 2021-05-25 DIAGNOSIS — I10 Essential (primary) hypertension: Secondary | ICD-10-CM

## 2021-05-25 DIAGNOSIS — E782 Mixed hyperlipidemia: Secondary | ICD-10-CM

## 2021-05-25 DIAGNOSIS — J069 Acute upper respiratory infection, unspecified: Secondary | ICD-10-CM | POA: Diagnosis not present

## 2021-05-25 DIAGNOSIS — J45901 Unspecified asthma with (acute) exacerbation: Secondary | ICD-10-CM

## 2021-05-25 DIAGNOSIS — F419 Anxiety disorder, unspecified: Secondary | ICD-10-CM

## 2021-05-25 DIAGNOSIS — R7303 Prediabetes: Secondary | ICD-10-CM | POA: Diagnosis not present

## 2021-05-25 MED ORDER — DOXYCYCLINE HYCLATE 100 MG PO TABS: 100.0000 mg | ORAL_TABLET | Freq: Two times a day (BID) | ORAL | 0 refills | Status: AC

## 2021-05-25 MED ORDER — HYDROCODONE BIT-HOMATROP MBR 5-1.5 MG/5ML PO SOLN: 5.0000 mL | Freq: Three times a day (TID) | ORAL | 0 refills | Status: DC | PRN

## 2021-05-25 MED ORDER — ROSUVASTATIN CALCIUM 5 MG PO TABS: 5.0000 mg | ORAL_TABLET | Freq: Every day | ORAL | 3 refills | Status: DC

## 2021-05-25 MED ORDER — MOMETASONE FUROATE 0.1 % EX CREA: TOPICAL_CREAM | CUTANEOUS | 0 refills | Status: DC

## 2021-05-25 NOTE — Patient Instructions (Addendum)
? ? ? ? ?Medications changes include :   doxycycline, cough syrup and crestor 5 mg daily ? ? ?Your prescription(s) have been sent to your pharmacy.  ? ? ? ?Return in about 1 year (around 05/26/2022) for CPE. ? ? ?Health Maintenance, Female ?Adopting a healthy lifestyle and getting preventive care are important in promoting health and wellness. Ask your health care provider about: ?The right schedule for you to have regular tests and exams. ?Things you can do on your own to prevent diseases and keep yourself healthy. ?What should I know about diet, weight, and exercise? ?Eat a healthy diet ? ?Eat a diet that includes plenty of vegetables, fruits, low-fat dairy products, and lean protein. ?Do not eat a lot of foods that are high in solid fats, added sugars, or sodium. ?Maintain a healthy weight ?Body mass index (BMI) is used to identify weight problems. It estimates body fat based on height and weight. Your health care provider can help determine your BMI and help you achieve or maintain a healthy weight. ?Get regular exercise ?Get regular exercise. This is one of the most important things you can do for your health. Most adults should: ?Exercise for at least 150 minutes each week. The exercise should increase your heart rate and make you sweat (moderate-intensity exercise). ?Do strengthening exercises at least twice a week. This is in addition to the moderate-intensity exercise. ?Spend less time sitting. Even light physical activity can be beneficial. ?Watch cholesterol and blood lipids ?Have your blood tested for lipids and cholesterol at 66 years of age, then have this test every 5 years. ?Have your cholesterol levels checked more often if: ?Your lipid or cholesterol levels are high. ?You are older than 66 years of age. ?You are at high risk for heart disease. ?What should I know about cancer screening? ?Depending on your health history and family history, you may need to have cancer screening at various ages. This  may include screening for: ?Breast cancer. ?Cervical cancer. ?Colorectal cancer. ?Skin cancer. ?Lung cancer. ?What should I know about heart disease, diabetes, and high blood pressure? ?Blood pressure and heart disease ?High blood pressure causes heart disease and increases the risk of stroke. This is more likely to develop in people who have high blood pressure readings or are overweight. ?Have your blood pressure checked: ?Every 3-5 years if you are 21-61 years of age. ?Every year if you are 27 years old or older. ?Diabetes ?Have regular diabetes screenings. This checks your fasting blood sugar level. Have the screening done: ?Once every three years after age 47 if you are at a normal weight and have a low risk for diabetes. ?More often and at a younger age if you are overweight or have a high risk for diabetes. ?What should I know about preventing infection? ?Hepatitis B ?If you have a higher risk for hepatitis B, you should be screened for this virus. Talk with your health care provider to find out if you are at risk for hepatitis B infection. ?Hepatitis C ?Testing is recommended for: ?Everyone born from [birth year] through 1965. ?Anyone with known risk factors for hepatitis C. ?Sexually transmitted infections (STIs) ?Get screened for STIs, including gonorrhea and chlamydia, if: ?You are sexually active and are younger than 66 years of age. ?You are older than 66 years of age and your health care provider tells you that you are at risk for this type of infection. ?Your sexual activity has changed since you were last screened, and you are at  increased risk for chlamydia or gonorrhea. Ask your health care provider if you are at risk. ?Ask your health care provider about whether you are at high risk for HIV. Your health care provider may recommend a prescription medicine to help prevent HIV infection. If you choose to take medicine to prevent HIV, you should first get tested for HIV. You should then be tested every 3  months for as long as you are taking the medicine. ?Pregnancy ?If you are about to stop having your period (premenopausal) and you may become pregnant, seek counseling before you get pregnant. ?Take 400 to 800 micrograms (mcg) of folic acid every day if you become pregnant. ?Ask for birth control (contraception) if you want to prevent pregnancy. ?Osteoporosis and menopause ?Osteoporosis is a disease in which the bones lose minerals and strength with aging. This can result in bone fractures. If you are 79 years old or older, or if you are at risk for osteoporosis and fractures, ask your health care provider if you should: ?Be screened for bone loss. ?Take a calcium or vitamin D supplement to lower your risk of fractures. ?Be given hormone replacement therapy (HRT) to treat symptoms of menopause. ?Follow these instructions at home: ?Alcohol use ?Do not drink alcohol if: ?Your health care provider tells you not to drink. ?You are pregnant, may be pregnant, or are planning to become pregnant. ?If you drink alcohol: ?Limit how much you have to: ?0-1 drink a day. ?Know how much alcohol is in your drink. In the [place] one drink equals one 12 oz bottle of beer (355 mL), one 5 oz glass of wine (148 mL), or one 1? oz glass of hard liquor (44 mL). ?Lifestyle ?Do not use any products that contain nicotine or tobacco. These products include cigarettes, chewing tobacco, and vaping devices, such as e-cigarettes. If you need help quitting, ask your health care provider. ?Do not use street drugs. ?Do not share needles. ?Ask your health care provider for help if you need support or information about quitting drugs. ?General instructions ?Schedule regular health, dental, and eye exams. ?Stay current with your vaccines. ?Tell your health care provider if: ?You often feel depressed. ?You have ever been abused or do not feel safe at home. ?Summary ?Adopting a healthy lifestyle and getting preventive care are important in promoting health  and wellness. ?Follow your health care provider's instructions about healthy diet, exercising, and getting tested or screened for diseases. ?Follow your health care provider's instructions on monitoring your cholesterol and blood pressure. ?This information is not intended to replace advice given to you by your health care provider. Make sure you discuss any questions you have with your health care provider. ?Document Revised: 07/20/2020 Document Reviewed: 07/20/2020 ?Elsevier Patient Education ? [place]. ? ?

## 2021-05-25 NOTE — Progress Notes (Signed)
? ? ?Subjective:  ? ? Patient ID: Dana Mccoy, female    DOB: 10-15-1955, 66 y.o.   MRN: [id number] ? ? ?This visit occurred during the SARS-CoV-2 public health emergency.  Safety protocols were in place, including screening questions prior to the visit, additional usage of staff PPE, and extensive cleaning of exam room while observing appropriate contact time as indicated for disinfecting solutions. ? ? ? ?HPI ?Dana Mccoy is here for  ?Chief Complaint  ?Patient presents with  ? URI  ? Annual Exam  ? ? ? ?Dana Mccoy - symptoms started just over one week ago.  She states nasal congestion, some ear pain, postnasal drip, sinus pressure, sore throat, productive cough, mild shortness of breath and chest tightness.  She has not had any fevers.  She is using her inhalers and allergy medications.  Her asthma has been well controlled overall. ? ? ? ?Intermittent ache-intermittent hits wall and is so fatigued she can not do anything.  She had a neg sleep study.  It can happen a couple of times a week.  If she sleeps a couple of hours she feels refreshed.  She may get 9 hrs of sleep at night and will still get this so its not related to her lack of sleep.  This has been going on for a while and no one has been able to tell her why. ? ? ?She had blood work done with the [facility].  She knows she should probably be on a statin. ? ? ?Medications and allergies reviewed with patient and updated if appropriate. ? ? ? ?Current Outpatient Medications on File Prior to Visit  ?Medication Sig Dispense Refill  ? acetaminophen (TYLENOL) 500 MG tablet Take 500 mg by mouth as needed.    ? albuterol (VENTOLIN HFA) 108 (90 Base) MCG/ACT inhaler INHALE 1-2 PUFFS INTO THE LUNGS EVERY 4 HOURS AS NEEDED FOR WHEEZING OR SHORTNESS OF BREATH. 18 each 1  ? Calcium Carbonate-Vit D-Min (CALCIUM 600+D PLUS MINERALS) 600-400 MG-UNIT CHEW Chew 1 each by mouth daily.    ? EPINEPHrine 0.3 mg/0.3 mL IJ SOAJ injection Inject 0.3 mg into the  muscle as needed for anaphylaxis. 1 each 1  ? FLUoxetine (PROZAC) 20 MG capsule TAKE 1 CAPSULE BY MOUTH EVERY DAY 90 capsule 0  ? fluticasone (VERAMYST) 27.5 MCG/SPRAY nasal spray Place 2 sprays into the nose daily.    ? fluticasone-salmeterol (ADVAIR HFA) 230-21 MCG/ACT inhaler Inhale 2 puffs into the lungs 2 (two) times daily. 1 each 12  ? hydrochlorothiazide (MICROZIDE) 12.5 MG capsule TAKE 1 CAPSULE BY MOUTH ONCE DAILY 90 capsule 2  ? LACTASE ENZYME PO Take 1 each by mouth as needed.    ? loratadine (CLARITIN) 10 MG tablet Take 1 tablet (10 mg total) by mouth 2 (two) times daily. 60 tablet 5  ? montelukast (SINGULAIR) 10 MG tablet Take 1 tablet (10 mg total) by mouth at bedtime. 30 tablet 11  ? Multiple Vitamin (MULTIVITAMIN) tablet Take 1 tablet by mouth daily.    ? pantoprazole (PROTONIX) 40 MG tablet Take 40 mg by mouth 2 (two) times daily.    ? valACYclovir (VALTREX) 500 MG tablet Take 1 tablet (500 mg total) by mouth 2 (two) times daily. 180 tablet 3  ? vitamin C (ASCORBIC ACID) 500 MG tablet Take 500 mg by mouth daily.    ? Vitamin D, Ergocalciferol, (DRISDOL) 1.25 MG (50000 UNIT) CAPS capsule Take 1 capsule (50,000 Units total) by mouth every 7 (seven)  days. 4 capsule 0  ? [DISCONTINUED] omeprazole (PRILOSEC) 40 MG capsule TAKE 1 CAPSULE BY MOUTH ONCE DAILY 90 capsule 0  ? ?No current facility-administered medications on file prior to visit.  ? ? ?Review of Systems  ?Constitutional:  Negative for fever.  ?HENT:  Positive for congestion, ear pain, postnasal drip, sinus pressure and sore throat.   ?Eyes:  Negative for visual disturbance.  ?Respiratory:  Positive for cough, chest tightness and shortness of breath (at times). Negative for wheezing.   ?Cardiovascular:  Positive for palpitations (occ, transient). Negative for chest pain and leg swelling.  ?Gastrointestinal:  Negative for abdominal pain, blood in stool, constipation, diarrhea and nausea.  ?     Occ gerd  ?Genitourinary:  Negative for dysuria and  hematuria.  ?Musculoskeletal:  Negative for arthralgias and back pain.  ?Skin:  Negative for rash.  ?Neurological:  Positive for headaches (with URI). Negative for light-headedness.  ?Psychiatric/Behavioral:  Negative for dysphoric mood. The patient is not nervous/anxious.   ? ?   ?Objective:  ? ?Vitals:  ? 05/25/21 1318  ?BP: 130/72  ?Pulse: 72  ?Temp: 97.7 ?F (36.5 ?C)  ?SpO2: 97%  ? ?Filed Weights  ? 05/25/21 1318  ?Weight: 200 lb 9.6 oz (91 kg)  ? ?Body mass index is 33.38 kg/m?. ? ?BP Readings from Last 3 Encounters:  ?05/25/21 130/72  ?05/04/21 121/76  ?04/29/21 118/72  ? ? ?Wt Readings from Last 3 Encounters:  ?05/25/21 200 lb 9.6 oz (91 kg)  ?05/04/21 199 lb (90.3 kg)  ?04/29/21 203 lb 8 oz (92.3 kg)  ? ? ?Depression screen [facility] 2/9 05/25/2021 04/20/2021 01/17/2020 07/27/2018 11/16/2016  ?Decreased Interest 0 1 0 0 0  ?Down, Depressed, Hopeless 0 1 0 0 0  ?PHQ - 2 Score 0 2 0 0 0  ?Altered sleeping - 0 - 0 -  ?Tired, decreased energy - 1 - 0 -  ?Change in appetite - 1 - 0 -  ?Feeling bad or failure about yourself  - 0 - 0 -  ?Trouble concentrating - 1 - 0 -  ?Moving slowly or fidgety/restless - 0 - 0 -  ?Suicidal thoughts - 0 - 0 -  ?PHQ-9 Score - 5 - 0 -  ?Difficult doing work/chores - Not difficult at all - Not difficult at all -  ?Some recent data might be hidden  ? ? ? ?GAD 7 : Generalized Anxiety Score 07/27/2018  ?Nervous, Anxious, on Edge 0  ?Control/stop worrying 0  ?Worry too much - different things 0  ?Trouble relaxing 0  ?Restless 0  ?Easily annoyed or irritable 0  ?Afraid - awful might happen 0  ?Total GAD 7 Score 0  ?Anxiety Difficulty Not difficult at all  ? ? ? ? ?  ?Physical Exam ?Constitutional: She appears well-developed and well-nourished. No distress.  ?HENT:  ?Head: Normocephalic and atraumatic.  ?Right Ear: External ear normal. Normal ear canal and TM ?Left Ear: External ear normal.  Normal ear canal and TM ?Mouth/Throat: Oropharynx is clear and moist.  ?Eyes: Conjunctivae and EOM are normal.   ?Neck: Neck supple. No tracheal deviation present. No thyromegaly present.  ?No carotid bruit  ?Cardiovascular: Normal rate, regular rhythm and normal heart sounds.   ?No murmur heard.  No edema. ?Pulmonary/Chest: Effort normal and breath sounds normal. No respiratory distress. She has no wheezes. She has no rales.  ?Breast: deferred   ?Abdominal: Soft. She exhibits no distension. There is no tenderness.  ?Lymphadenopathy: She has no cervical adenopathy.  ?  Skin: Skin is warm and dry. She is not diaphoretic.  ?Psychiatric: She has a normal mood and affect. Her behavior is normal.  ? ? ? ?Lab Results  ?Component Value Date  ? WBC 6.4 04/20/2021  ? HGB 13.8 04/20/2021  ? HCT 42.4 04/20/2021  ? PLT 278 04/20/2021  ? GLUCOSE 81 04/20/2021  ? CHOL 264 (H) 04/20/2021  ? TRIG 200 (H) 04/20/2021  ? HDL 59 04/20/2021  ? LDLDIRECT 150.0 09/06/2019  ? LDLCALC 168 (H) 04/20/2021  ? ALT 18 04/20/2021  ? AST 28 04/20/2021  ? NA 142 04/20/2021  ? K 3.6 04/20/2021  ? CL 101 04/20/2021  ? CREATININE 0.77 04/20/2021  ? BUN 13 04/20/2021  ? CO2 26 04/20/2021  ? TSH 4.270 04/20/2021  ? HGBA1C 5.7 (H) 04/20/2021  ? ? ?The 10-year ASCVD risk score (Arnett DK, et al., 2019) is: 9.5% ?  Values used to calculate the score: ?    Age: 34 years ?    Sex: Female ?    Is Non-Hispanic African American: No ?    Diabetic: No ?    Tobacco smoker: No ?    Systolic Blood Pressure: 161 mmHg ?    Is BP treated: Yes ?    HDL Cholesterol: 59 mg/dL ?    Total Cholesterol: 264 mg/dL ? ? ?   ?Assessment & Plan:  ? ?Physical exam: ?Screening blood work reviewed ?Exercise  walking ?Weight  working on weight loss ?Substance abuse  none ? ? ?Reviewed recommended immunizations. ? ? ?Health Maintenance  ?Topic Date Due  ? Pneumonia Vaccine 55+ Years old (2 - PCV) 07/31/2013  ? COVID-19 Vaccine (2 - Pfizer series) 06/10/2021 (Originally 01/14/2021)  ? Zoster Vaccines- Shingrix (1 of 2) 08/25/2021 (Originally 05/24/2005)  ? TETANUS/TDAP  05/26/2022 (Originally  05/25/1974)  ? MAMMOGRAM  05/12/2023  ? DEXA SCAN  07/27/2023  ? COLONOSCOPY (Pts 45-86yr Insurance coverage will need to be confirmed)  12/17/2026  ? INFLUENZA VACCINE  Completed  ? Hepatitis C Screening  Comp

## 2021-05-26 DIAGNOSIS — J069 Acute upper respiratory infection, unspecified: Secondary | ICD-10-CM | POA: Insufficient documentation

## 2021-05-26 NOTE — Assessment & Plan Note (Signed)
Chronic Controlled, Stable Continue fluoxetine 20 mg daily 

## 2021-05-26 NOTE — Assessment & Plan Note (Signed)
Acute ?Related to URI ?Currently well controlled with inhalers-Advair twice daily, albuterol as needed-continue ?No need for steroids at this time ?

## 2021-05-26 NOTE — Assessment & Plan Note (Signed)
Chronic ?Blood pressure well controlled ?Continue HCTZ 12.5 mg daily ?

## 2021-05-26 NOTE — Assessment & Plan Note (Signed)
Acute ?Concern for bacterial cause ?Has mild asthma exacerbation that is controlled with current inhalers ?Start doxycycline 100 mg twice daily x10 days, Hycodan cough syrup ?Continue over-the-counter cold medications and allergy medications ?Call if no improvement ?

## 2021-05-26 NOTE — Assessment & Plan Note (Addendum)
Chronic ?Has been on Lipitor in the past, but did have some cognitive issues ?LDL again elevated and ASCVD risk is 9.5 %, so she does need to be on a statin which she is agreeable to ?Start Crestor 5 mg daily, which I think she will tolerate better-discussed possible side effects-this is less likely to cause cognitive issues and muscle pain ?She is working on weight loss ?Healthy diet, regular exercise encouraged ?Check lipid panel, hepatic function in 6-8 weeks ?

## 2021-05-26 NOTE — Assessment & Plan Note (Signed)
Chronic ?Lab Results  ?Component Value Date  ? HGBA1C 5.7 (H) 04/20/2021  ? ?She is working with the [facility] and weight loss ?Low sugar/carbohydrate diet ?Exercise ?

## 2021-05-27 ENCOUNTER — Encounter (INDEPENDENT_AMBULATORY_CARE_PROVIDER_SITE_OTHER): Payer: Self-pay | Admitting: Family Medicine

## 2021-05-27 ENCOUNTER — Other Ambulatory Visit (INDEPENDENT_AMBULATORY_CARE_PROVIDER_SITE_OTHER): Payer: Self-pay | Admitting: Family Medicine

## 2021-05-27 ENCOUNTER — Ambulatory Visit (INDEPENDENT_AMBULATORY_CARE_PROVIDER_SITE_OTHER): Payer: Medicare Other | Admitting: Family Medicine

## 2021-05-27 ENCOUNTER — Other Ambulatory Visit: Payer: Self-pay

## 2021-05-27 VITALS — BP 107/63 | HR 91 | Temp 98.3°F | Ht 65.0 in | Wt 197.0 lb

## 2021-05-27 DIAGNOSIS — Z6832 Body mass index (BMI) 32.0-32.9, adult: Secondary | ICD-10-CM

## 2021-05-27 DIAGNOSIS — E7849 Other hyperlipidemia: Secondary | ICD-10-CM

## 2021-05-27 DIAGNOSIS — E559 Vitamin D deficiency, unspecified: Secondary | ICD-10-CM | POA: Diagnosis not present

## 2021-05-27 DIAGNOSIS — E669 Obesity, unspecified: Secondary | ICD-10-CM

## 2021-05-27 MED ORDER — VITAMIN D (ERGOCALCIFEROL) 1.25 MG (50000 UNIT) PO CAPS: 50000.0000 [IU] | ORAL_CAPSULE | ORAL | 0 refills | Status: DC

## 2021-05-30 ENCOUNTER — Other Ambulatory Visit (INDEPENDENT_AMBULATORY_CARE_PROVIDER_SITE_OTHER): Payer: Self-pay | Admitting: Family Medicine

## 2021-05-30 DIAGNOSIS — E559 Vitamin D deficiency, unspecified: Secondary | ICD-10-CM

## 2021-05-31 NOTE — Telephone Encounter (Signed)
Dr.Opalski ?

## 2021-06-02 ENCOUNTER — Telehealth: Payer: Self-pay | Admitting: Internal Medicine

## 2021-06-02 NOTE — Telephone Encounter (Signed)
Called and spoke with patient. She stated that she has been having more SOB and wheezing episodes for the past few weeks. She contacted her PCP and was prescribed doxy. She has finished the abx but has not noticed any relief. She has been using her Advair and albuterol.  ? ?I was advised her since she has already had an abx and is not better, she will need to come in for an OV. She has been scheduled to see Beth tomorrow at 1130am.  ? ?Nothing further needed at time of call.  ?

## 2021-06-02 NOTE — Progress Notes (Signed)
? ? ? ?Chief Complaint:  ? ?OBESITY ?Dana Mccoy is here to discuss her progress with her obesity treatment plan along with follow-up of her obesity related diagnoses. Dana Mccoy is on the Category 2 Plan with breakfast options and states she is following her eating plan approximately 93% of the time. Dana Mccoy states she is not currently exercising. ? ?Today's visit was #: 3 ?Starting weight: 203 lbs ?Starting date: 04/20/2021 ?Today's weight: 197 lbs ?Today's date: 05/27/2021 ?Total lbs lost to date: 6 ?Total lbs lost since last in-office visit: 2 ? ?Interim History: Dana Mccoy traveled to [place] for 2 weeks, and she had her birthday [birth year] ago. She got sick with upper respiratory infection symptoms. She has been on antibiotics for 2 days now for "bronchitis". She hasn't been able to eat everything on the plan, and with being sick, often times she just eats whatever tastes good. She has no issues with the meal plan. She did follow up with her PCP in regards to cholesterol and CPE since we saw her. Notes were reviewed. ? ?Subjective:  ? ?1. Other hyperlipidemia ?Briella did meet with her PCP recently and they started her on Crestor. She took the first dose last night, and tolerated it well. ? ?2. Vitamin D deficiency ?Dana Mccoy was started on Ergocalciferol at her last office visit. She had a normal bone density in 2020. ? ?Assessment/Plan:  ?No orders of the defined types were placed in this encounter. ? ? ?Medications Discontinued During This Encounter  ?Medication Reason  ? Vitamin D, Ergocalciferol, (DRISDOL) 1.25 MG (50000 UNIT) CAPS capsule Reorder  ?  ? ?Meds ordered this encounter  ?Medications  ? Vitamin D, Ergocalciferol, (DRISDOL) 1.25 MG (50000 UNIT) CAPS capsule  ?  Sig: Take 1 capsule (50,000 Units total) by mouth every 7 (seven) days.  ?  Dispense:  4 capsule  ?  Refill:  0  ?  30 d supply;  ** OV for RF **   Do not send RF request  ?  ? ?1. Other hyperlipidemia ?Dana Mccoy will continue Crestor per her PCP. She will decreased sat and  trans fats by following her meal plan. She is ok to start walking. ? ?2. Vitamin D deficiency ?We will refill prescription Vitamin D for 1 month. We recommend every 2 years bone density tests for osteoporosis prevention. Walking is great for exercise.  ? ?- Vitamin D, Ergocalciferol, (DRISDOL) 1.25 MG (50000 UNIT) CAPS capsule; Take 1 capsule (50,000 Units total) by mouth every 7 (seven) days.  Dispense: 4 capsule; Refill: 0 ? ?3. Obesity with current BMI of 32.8 ?Dana Mccoy is currently in the action stage of change. As such, her goal is to continue with weight loss efforts. She has agreed to the Category 2 Plan with breakfast options.  ? ?Exercise goals: All adults should avoid inactivity. Some physical activity is better than none, and adults who participate in any amount of physical activity gain some health benefits. Ok to walk some. ? ?Behavioral modification strategies: no skipping meals and avoiding temptations. ? ?Dana Mccoy has agreed to follow-up with our clinic in 2 weeks with Dana Pacific, NP. She was informed of the importance of frequent follow-up visits to maximize her success with intensive lifestyle modifications for her multiple health conditions.  ? ?Objective:  ? ?Blood pressure 107/63, pulse 91, temperature 98.3 ?F (36.8 ?C), height '5\' 5"'$  (1.651 m), weight 197 lb (89.4 kg), SpO2 98 %. ?Body mass index is 32.78 kg/m?. ? ?General: Cooperative, alert, well developed, in no acute  distress. ?HEENT: Conjunctivae and lids unremarkable. ?Cardiovascular: Regular rhythm.  ?Lungs: Normal work of breathing. ?Neurologic: No focal deficits.  ? ?Lab Results  ?Component Value Date  ? CREATININE 0.77 04/20/2021  ? BUN 13 04/20/2021  ? NA 142 04/20/2021  ? K 3.6 04/20/2021  ? CL 101 04/20/2021  ? CO2 26 04/20/2021  ? ?Lab Results  ?Component Value Date  ? ALT 18 04/20/2021  ? AST 28 04/20/2021  ? ALKPHOS 130 (H) 04/20/2021  ? BILITOT 0.5 04/20/2021  ? ?Lab Results  ?Component Value Date  ? HGBA1C 5.7 (H) 04/20/2021   ? HGBA1C 5.5 09/17/2015  ? HGBA1C 5.7 01/24/2006  ? ?Lab Results  ?Component Value Date  ? INSULIN 11.4 04/20/2021  ? ?Lab Results  ?Component Value Date  ? TSH 4.270 04/20/2021  ? ?Lab Results  ?Component Value Date  ? CHOL 264 (H) 04/20/2021  ? HDL 59 04/20/2021  ? LDLCALC 168 (H) 04/20/2021  ? LDLDIRECT 150.0 09/06/2019  ? TRIG 200 (H) 04/20/2021  ? CHOLHDL 5 09/06/2019  ? ?Lab Results  ?Component Value Date  ? VD25OH 29.2 (L) 04/20/2021  ? ?Lab Results  ?Component Value Date  ? WBC 6.4 04/20/2021  ? HGB 13.8 04/20/2021  ? HCT 42.4 04/20/2021  ? MCV 86 04/20/2021  ? PLT 278 04/20/2021  ? ?Lab Results  ?Component Value Date  ? IRON 62 09/06/2019  ? TIBC 401 09/06/2019  ? FERRITIN 16 09/06/2019  ? ? ?Obesity Behavioral Intervention:  ? ?Approximately 15 minutes were spent on the discussion below. ? ?ASK: ?We discussed the diagnosis of obesity with Lattie Haw today and Dana Mccoy agreed to give [name] permission to discuss obesity behavioral modification therapy today. ? ?ASSESS: ?[name] has the diagnosis of obesity and her BMI today is 32.8. Dana Mccoy is in the action stage of change.  ? ?ADVISE: ?Dana Mccoy was educated on the multiple health risks of obesity as well as the benefit of weight loss to improve her health. She was advised of the need for long term treatment and the importance of lifestyle modifications to improve her current health and to decrease her risk of future health problems. ? ?AGREE: ?Multiple dietary modification options and treatment options were discussed and Dana Mccoy agreed to follow the recommendations documented in the above note. ? ?ARRANGE: ?Dana Mccoy was educated on the importance of frequent visits to treat obesity as outlined per CMS and USPSTF guidelines and agreed to schedule her next follow up appointment today. ? ?Attestation Statements:  ? ?Reviewed by clinician on day of visit: allergies, medications, problem list, medical history, surgical history, family history, social history, and previous encounter  notes. ? ? ?I, [name], am acting as transcriptionist for [employer], DO. ? ?I have reviewed the above documentation for accuracy and completeness, and I agree with the above. Marjory Sneddon, D.O. ? ?The [place] was signed into law in 2016 which includes the topic of electronic health records.  This provides immediate access to information in MyChart.  This includes consultation notes, operative notes, office notes, lab results and pathology reports.  If you have any questions about what you read please let us know at your next visit so we can discuss your concerns and take corrective action if need be.  We are right here with you. ? ? ?

## 2021-06-03 ENCOUNTER — Ambulatory Visit: Payer: Medicare Other | Admitting: Primary Care

## 2021-06-03 ENCOUNTER — Other Ambulatory Visit: Payer: Self-pay

## 2021-06-03 ENCOUNTER — Encounter: Payer: Self-pay | Admitting: Primary Care

## 2021-06-03 VITALS — BP 112/78 | HR 68 | Temp 97.7°F | Ht 65.0 in | Wt 198.8 lb

## 2021-06-03 DIAGNOSIS — J4550 Severe persistent asthma, uncomplicated: Secondary | ICD-10-CM | POA: Diagnosis not present

## 2021-06-03 DIAGNOSIS — J45901 Unspecified asthma with (acute) exacerbation: Secondary | ICD-10-CM

## 2021-06-03 LAB — NITRIC OXIDE: Nitric Oxide: 11

## 2021-06-03 MED ORDER — PREDNISONE 10 MG PO TABS: ORAL_TABLET | ORAL | 0 refills | Status: DC

## 2021-06-03 NOTE — Patient Instructions (Addendum)
Continue Advair two puffs morning and evening ?Continue Claritin and Singulair  ?Finish Doxycycline course. Feno was normal.  ?I will send in low dose prednisone '20mg'$  x 5 days for your symptoms ?Your allergy testing was positive for several allergens and IGE was elevated at 144.  ?We will start the process for getting you approval for biologic to treat your asthma called Xolair.  ?Follow-up:  ?6-8 weeks with Dr. Shearon Stalls or sooner if needed  ?_____________________________________________________________________________________ ?Immunoglobulin E (IgE) plays a central role in the pathogenesis of allergic diseases, including asthma.  IgE is produced by B lymphocytes under the direction of two cytokines. Omalizumab is a recombinant humanized IgG1 monoclonal antibody that binds IgE with high affinity and has been developed for the treatment of allergic diseases. Omalizumab is the only available anti-IgE therapy in use ?OMALIZUMAB (oh mah lye ZOO mab) is used to help treat allergic asthma, chronic hives, and nasal polyps. It may be given with other treatments for these conditions. ?This medicine may be used for other purposes; ask your health care provider or pharmacist if you have questions. ?COMMON BRAND NAME(S): Xolair, Xolair Prefilled ?What should I tell my care team before I take this medication? ?They need to know if you have any of these conditions: ?cancer ?have or have had a parasite infection ?an unusual or allergic reaction to omalizumab, hamster proteins, latex, other medicines, foods, dyes, or preservatives ?pregnant or trying to get pregnant ?breast-feeding ?How should I use this medication? ?This drug is injected under the skin. It is usually given by a health care provider in a hospital or clinic setting. It may also be given at home. If you get this medicine at home, you will be taught how to prepare and give it. Use exactly as directed. Take it as directed on the prescription label. Keep taking it unless  your health care provider tells you to stop. ?It is important that you put your used needles and syringes in a special sharps container. Do not put them in a trash can. If you do not have a sharps container, call your pharmacist or healthcare provider to get one. ?This medicine comes with INSTRUCTIONS FOR USE. Ask your pharmacist for directions on how to use this medicine. Read the information carefully. Talk to your pharmacist or health care provider if you have questions. ?A special MedGuide will be given to you before each treatment or by the pharmacist with each prescription and refill. Be sure to read this information carefully each time. ?Talk to your pediatrician regarding the use of this drug in children. While this drug may be prescribed for children as young as 6 years for selected conditions, precautions do apply. ?Overdosage: If you think you have taken too much of this medicine contact a poison control center or emergency room at once. ?NOTE: This medicine is only for you. Do not share this medicine with others. ?What if I miss a dose? ?If you get this medicine at the hospital or clinic: It is important not to miss your dose. Call your health care provider if you are unable to keep an appointment. ?If you give yourself this medicine at home: If you miss a dose, take it as soon as you can. Then continue your normal schedule. If it is almost time for your next dose, take only that dose. Do not take double or extra doses. Call your health care provider with questions. ?What may interact with this medication? ?Interactions are not expected. ?This list may not describe all  possible interactions. Give your health care provider a list of all the medicines, herbs, non-prescription drugs, or dietary supplements you use. Also tell them if you smoke, drink alcohol, or use illegal drugs. Some items may interact with your medicine. ?What should I watch for while using this medication? ?After receiving each  injection, you will be monitored in your doctor's office or clinic. Your doctor will determine how long you will be observed after each injection. You may need blood work done while you are taking this medicine. ?Improvement of your condition will not be immediate. Improvements will occur gradually. Do not stop taking any of your other treatments unless otherwise directed by your doctor. ?Some people who are at a high risk for parasite (worm) infections, get a parasite infection after receiving this drug. Your healthcare provider can test your stool to check for this type of infection if you are at risk. ?What side effects may I notice from receiving this medication? ?Side effects that you should report to your doctor or health care professional as soon as possible: ?allergic reactions like skin rash, itching or hives, swelling of the face, lips, or tongue ?breathing problems ?changes in vision ?chest pain ?pain, tingling, numbness in the hands or feet ?sudden numbness or weakness of the face, arm or leg ?symptoms of joint pain or swelling, fever, and rash ?unusual bleeding or bruising ?unusually weak or tired ?Side effects that usually do not require medical attention (report to your doctor or health care professional if they continue or are bothersome): ?headache ?nausea ?pain, redness or irritation at site where injected ?runny nose ?sore throat ?This list may not describe all possible side effects. Call your doctor for medical advice about side effects. You may report side effects to FDA at 1-800-FDA-1088. ?Where should I keep my medication? ?Keep out of the reach of children and pets. ?Store in the refrigerator. Do not freeze. Keep this medicine in the original container until you are ready to take it. Protect from light. Get rid of any unused medicine after the expiration date. ?To get rid of medicines that are no longer needed or have expired: ?Take the medicine to a medicine take-back program. Check with  your pharmacy or law enforcement to find a location. ?If you cannot return the medicine, ask your pharmacist or health care provider how to get rid of this medicine safely. ?NOTE: This sheet is a summary. It may not cover all possible information. If you have questions about this medicine, talk to your doctor, pharmacist, or health care provider. ?? 2022 Elsevier/Gold Standard (2020-11-17 00:00:00) ? ?

## 2021-06-03 NOTE — Progress Notes (Signed)
? ?'@Patient'$  ID: De Burrs, female    DOB: 1955-07-25, 66 y.o.   MRN: [id number] ? ?Chief Complaint  ?Patient presents with  ? Follow-up  ?  She has some intermittent wheezing, hoarseness, short of breath with exertion, mostly dry cough, and intermittent headaches.   ? ? ?Referring provider: ?Binnie Rail, MD ? ?HPI: ?66 year old female, former smoker quit 1987 (18-pack-year history).  Past medical history significant for moderate persistent asthma, allergic rhinitis  Patient of Dr. Shearon Stalls, last seen in office on 02/23/2021. Ordered for allergy panel IGE.  ? ?06/03/2021 ?Patient presents today for acute office visit.  She reports acute symptoms of wheezing and chest tightness. She has low energy, sinus congestion, voice hoarseness. Symptoms originally started out as sinus congestion on March 6th. Dr. Quay Burow put her on doxycyline on the 14th. Cough has improved. She compliant with Advair 230 HFA 2 puffs twice daily and Singulair. Using albuterol 1-2 times a day. She has multiple allergens on respiratory allergy panel, IgE 144 in December 2022.  She'd followed by Dr. Ernst Bowler with asthma/allergy and has recently started the process for Xolair approval.  ? ?Allergies  ?Allergen Reactions  ? Penicillins   ?  RASH, amoxicillin  ? Lipitor [Atorvastatin] Other (See Comments)  ?  Cognitive impact  ? ? ?Immunization History  ?Administered Date(s) Administered  ? Influenza, High Dose Seasonal PF 12/10/2020  ? Influenza,inj,Quad PF,6+ Mos 11/16/2016  ? Influenza-Unspecified 12/12/2017  ? Pension scheme manager 58yr & up 12/24/2020  ? Pneumococcal Polysaccharide-23 07/31/2012  ? ? ?Past Medical History:  ?Diagnosis Date  ? Anemia   ? Angio-edema   ? Anxiety   ? Asthma exacerbation, mild 02/12/2020  ? Back pain   ? Cervical stenosis of spine   ? Frequent headaches   ? GERD (gastroesophageal reflux disease)   ? Hyperlipidemia   ? Hypertension   ? IBS (irritable bowel syndrome)   ? Joint pain   ? Metabolic  syndrome   ? Osteoarthritis   ? Other fatigue   ? Palpitations   ? Prediabetes   ? Reactive airway disease   ? post CAP  ? Shortness of breath   ? Shortness of breath on exertion   ? Swallowing difficulty   ? ? ?Tobacco History: ?Social History  ? ?Tobacco Use  ?Smoking Status Former  ? Packs/day: 1.50  ? Years: 12.00  ? Pack years: 18.00  ? Types: Cigarettes  ? Start date: 03/14/1973  ? Quit date: 03/14/1985  ? Years since quitting: 36.2  ?Smokeless Tobacco Never  ?Tobacco Comments  ? smoked 1975-1986, up to 1.5 ppd  ? ?Counseling given: Not Answered ?Tobacco comments: smoked 1975-1986, up to 1.5 ppd ? ? ?Outpatient Medications Prior to Visit  ?Medication Sig Dispense Refill  ? acetaminophen (TYLENOL) 500 MG tablet Take 500 mg by mouth as needed.    ? albuterol (VENTOLIN HFA) 108 (90 Base) MCG/ACT inhaler INHALE 1-2 PUFFS INTO THE LUNGS EVERY 4 HOURS AS NEEDED FOR WHEEZING OR SHORTNESS OF BREATH. 18 each 1  ? Calcium Carbonate-Vit D-Min (CALCIUM 600+D PLUS MINERALS) 600-400 MG-UNIT CHEW Chew 1 each by mouth daily.    ? doxycycline (VIBRA-TABS) 100 MG tablet Take 1 tablet (100 mg total) by mouth 2 (two) times daily for 10 days. 20 tablet 0  ? EPINEPHrine 0.3 mg/0.3 mL IJ SOAJ injection Inject 0.3 mg into the muscle as needed for anaphylaxis. 1 each 1  ? FLUoxetine (PROZAC) 20 MG capsule TAKE 1 CAPSULE BY MOUTH  EVERY DAY 90 capsule 0  ? fluticasone (VERAMYST) 27.5 MCG/SPRAY nasal spray Place 2 sprays into the nose daily.    ? fluticasone-salmeterol (ADVAIR HFA) 230-21 MCG/ACT inhaler Inhale 2 puffs into the lungs 2 (two) times daily. 1 each 12  ? hydrochlorothiazide (MICROZIDE) 12.5 MG capsule TAKE 1 CAPSULE BY MOUTH ONCE DAILY 90 capsule 2  ? HYDROcodone bit-homatropine (HYCODAN) 5-1.5 MG/5ML syrup Take 5 mLs by mouth every 8 (eight) hours as needed for cough. 120 mL 0  ? LACTASE ENZYME PO Take 1 each by mouth as needed.    ? loratadine (CLARITIN) 10 MG tablet Take 1 tablet (10 mg total) by mouth 2 (two) times daily. 60  tablet 5  ? mometasone (ELOCON) 0.1 % cream APPLY A THIN LAYER TO THE AFFECTED AREA OF THE SKIN DAILY 45 g 0  ? montelukast (SINGULAIR) 10 MG tablet Take 1 tablet (10 mg total) by mouth at bedtime. 30 tablet 11  ? Multiple Vitamin (MULTIVITAMIN) tablet Take 1 tablet by mouth daily.    ? pantoprazole (PROTONIX) 40 MG tablet Take 40 mg by mouth 2 (two) times daily.    ? rosuvastatin (CRESTOR) 5 MG tablet Take 1 tablet (5 mg total) by mouth daily. 90 tablet 3  ? valACYclovir (VALTREX) 500 MG tablet Take 1 tablet (500 mg total) by mouth 2 (two) times daily. 180 tablet 3  ? vitamin C (ASCORBIC ACID) 500 MG tablet Take 500 mg by mouth daily.    ? Vitamin D, Ergocalciferol, (DRISDOL) 1.25 MG (50000 UNIT) CAPS capsule Take 1 capsule (50,000 Units total) by mouth every 7 (seven) days. 4 capsule 0  ? ?No facility-administered medications prior to visit.  ? ? ?Review of Systems ? ?Review of Systems  ?Constitutional: Negative.   ?HENT:  Positive for congestion.   ?Respiratory:  Positive for cough, chest tightness, shortness of breath and wheezing.   ? ? ?Physical Exam ? ?BP 112/78 (BP Location: Left Arm, Patient Position: Sitting, Cuff Size: Normal)   Pulse 68   Temp 97.7 ?F (36.5 ?C) (Oral)   Ht '5\' 5"'$  (1.651 m)   Wt 198 lb 12.8 oz (90.2 kg)   SpO2 95%   BMI 33.08 kg/m?  ?Physical Exam ?Constitutional:   ?   Appearance: Normal appearance.  ?HENT:  ?   Head: Normocephalic and atraumatic.  ?Cardiovascular:  ?   Rate and Rhythm: Normal rate and regular rhythm.  ?Pulmonary:  ?   Effort: Pulmonary effort is normal.  ?   Breath sounds: Normal breath sounds. No wheezing, rhonchi or rales.  ?Musculoskeletal:     ?   General: Normal range of motion.  ?Skin: ?   General: Skin is warm and dry.  ?Neurological:  ?   General: No focal deficit present.  ?   Mental Status: She is alert and oriented to person, place, and time. Mental status is at baseline.  ?Psychiatric:     ?   Mood and Affect: Mood normal.     ?   Behavior: Behavior  normal.     ?   Thought Content: Thought content normal.     ?   Judgment: Judgment normal.  ?  ? ?Lab Results: ? ?CBC ?   ?Component Value Date/Time  ? WBC 6.4 04/20/2021 0922  ? WBC 6.0 01/21/2021 1154  ? RBC 4.92 04/20/2021 0922  ? RBC 4.74 01/21/2021 1154  ? HGB 13.8 04/20/2021 0922  ? HCT 42.4 04/20/2021 0922  ? PLT 278 04/20/2021 0922  ?  MCV 86 04/20/2021 0922  ? MCH 28.0 04/20/2021 0922  ? MCH 27.8 11/18/2020 1557  ? MCHC 32.5 04/20/2021 0922  ? MCHC 31.9 01/21/2021 1154  ? RDW 12.5 04/20/2021 0922  ? LYMPHSABS 1.1 04/20/2021 0922  ? MONOABS 0.5 01/21/2021 1154  ? EOSABS 0.1 04/20/2021 0922  ? BASOSABS 0.0 04/20/2021 9753  ? ? ?BMET ?   ?Component Value Date/Time  ? NA 142 04/20/2021 0922  ? K 3.6 04/20/2021 0922  ? CL 101 04/20/2021 0922  ? CO2 26 04/20/2021 0922  ? GLUCOSE 81 04/20/2021 0922  ? GLUCOSE 81 11/18/2020 1557  ? GLUCOSE 77 01/24/2006 1211  ? BUN 13 04/20/2021 0922  ? CREATININE 0.77 04/20/2021 0922  ? CALCIUM 9.7 04/20/2021 0922  ? GFRNONAA >60 11/18/2020 1557  ? GFRAA >60 11/10/2019 0032  ? ? ?BNP ?No results found for: BNP ? ?ProBNP ?No results found for: PROBNP ? ?Imaging: ?No results found. ? ? ?Assessment & Plan:  ? ?Asthma exacerbation, mild ?- Patient has acute symptoms of shortness of breath, wheezing and chest tightness. Associated fatigue, sinus congestion and voice hoarseness. Multiple allergens on respiratory allergy panel. IgE 144 in December 2022. She is compliant with Advair 230-21 two puffs twice daily and Singulair '10mg'$  qhs. In the process of getting Xolair approved with asthma/allergy. Sending in prednisone '20mg'$  x 5 days. Follow-up if symptoms do not improve or worsen.  ? ? ?Martyn Ehrich, NP ?06/11/2021 ? ?

## 2021-06-10 ENCOUNTER — Ambulatory Visit (INDEPENDENT_AMBULATORY_CARE_PROVIDER_SITE_OTHER): Payer: Medicare Other | Admitting: Nurse Practitioner

## 2021-06-10 ENCOUNTER — Encounter (INDEPENDENT_AMBULATORY_CARE_PROVIDER_SITE_OTHER): Payer: Self-pay | Admitting: Nurse Practitioner

## 2021-06-10 VITALS — BP 111/68 | HR 74 | Temp 97.9°F | Ht 65.0 in | Wt 195.0 lb

## 2021-06-10 DIAGNOSIS — E7849 Other hyperlipidemia: Secondary | ICD-10-CM

## 2021-06-10 DIAGNOSIS — R7303 Prediabetes: Secondary | ICD-10-CM | POA: Diagnosis not present

## 2021-06-10 DIAGNOSIS — E559 Vitamin D deficiency, unspecified: Secondary | ICD-10-CM | POA: Diagnosis not present

## 2021-06-10 DIAGNOSIS — E669 Obesity, unspecified: Secondary | ICD-10-CM

## 2021-06-10 DIAGNOSIS — Z6832 Body mass index (BMI) 32.0-32.9, adult: Secondary | ICD-10-CM

## 2021-06-11 NOTE — Assessment & Plan Note (Addendum)
-   Patient has acute symptoms of shortness of breath, wheezing and chest tightness. Associated fatigue, sinus congestion and voice hoarseness. Multiple allergens on respiratory allergy panel. IgE 144 in December 2022. She is compliant with Advair 230-21 two puffs twice daily and Singulair '10mg'$  qhs. In the process of getting Xolair approved with asthma/allergy. Sending in prednisone '20mg'$  x 5 days. Follow-up if symptoms do not improve or worsen.  ?

## 2021-06-11 NOTE — Progress Notes (Signed)
? ? ? ?Chief Complaint:  ? ?OBESITY ?Dana Mccoy is here to discuss her progress with her obesity treatment plan along with follow-up of her obesity related diagnoses. Dana Mccoy is on the Category 2 Plan with breakfast options and states she is following her eating plan approximately 99% of the time. Dana Mccoy states she is walking 7,000 steps and dancing 3-4 times per week. ? ?Today's visit was #: 4 ?Starting weight: 203 lbs ?Starting date: 04/20/2021 ?Today's weight: 195 lbs ?Today's date: 06/10/2021 ?Total lbs lost to date: 8 lbs ?Total lbs lost since last in-office visit: 2 lbs ? ?Interim History: Dana Mccoy is doing well with weight loss since last visit. Since her last visit she had to take prednisone due to  an asthma exacerbation. She notes hunger in the evenings. She denies cravings. She is drinking 64 ounces of water daily. She limits coffee due to reflux.  ? ?Subjective:  ? ?1. Other hyperlipidemia ?Dana Mccoy is taking Crestor 5 mg. She denies side effects.  ? ?2. Vitamin D deficiency ?Dana Mccoy is taking Vitamin D 50,000 IU weekly. She denies side effects nausea, vomiting and muscle weakness.  ? ?3. Pre-diabetes ?Dana Mccoy's last A1C was 5.7. She is not on medications.  ? ?Assessment/Plan:  ? ?1. Other hyperlipidemia ?Cardiovascular risk and specific lipid/LDL goals reviewed.  Dana Mccoy will continue to follow up with primary care physician. She will continue medication as directed. We discussed several lifestyle modifications today and Dana Mccoy will continue to work on diet, exercise and weight loss efforts. Orders and follow up as documented in patient record.  ? ?Counseling ?Intensive lifestyle modifications are the first line treatment for this issue. ?Dietary changes: Increase soluble fiber. Decrease simple carbohydrates. ?Exercise changes: Moderate to vigorous-intensity aerobic activity 150 minutes per week if tolerated. ?Lipid-lowering medications: see documented in medical record. ? ?2. Vitamin D deficiency ?Low Vitamin D level contributes to  fatigue and are associated with obesity, breast, and colon cancer. Dana Mccoy agrees to continue to take prescription Vitamin D 50,000 IU every week and she will follow-up for routine testing of Vitamin D, at least 2-3 times per year to avoid over-replacement. We discussed side effects.  ? ?3. Pre-diabetes ?Dana Mccoy will continue to work on weight loss, exercise, and decreasing simple carbohydrates to help decrease the risk of diabetes.  ? ?4. Obesity with current BMI of 32.4 ?Dana Mccoy is currently in the action stage of change. As such, her goal is to continue with weight loss efforts. She has agreed to the Category 2 Plan and keeping a food journal and adhering to recommended goals of 300-400 calories and 30 plus grams of protein lunch.  ? ?Handouts given: Protein content of foods, additional breakfast and lunch options.  ? ?Exercise goals:  As is. ? ?Behavioral modification strategies: increasing lean protein intake, increasing water intake, no skipping meals, and meal planning and cooking strategies. ? ?Dana Mccoy has agreed to follow-up with our clinic in 2 weeks. She was informed of the importance of frequent follow-up visits to maximize her success with intensive lifestyle modifications for her multiple health conditions.  ? ?Objective:  ? ?Blood pressure 111/68, pulse 74, temperature 97.9 ?F (36.6 ?C), height '5\' 5"'$  (1.651 m), weight 195 lb (88.5 kg), SpO2 95 %. ?Body mass index is 32.45 kg/m?. ? ?General: Cooperative, alert, well developed, in no acute distress. ?HEENT: Conjunctivae and lids unremarkable. ?Cardiovascular: Regular rhythm.  ?Lungs: Normal work of breathing. ?Neurologic: No focal deficits.  ? ?Lab Results  ?Component Value Date  ? CREATININE 0.77 04/20/2021  ? BUN  13 04/20/2021  ? NA 142 04/20/2021  ? K 3.6 04/20/2021  ? CL 101 04/20/2021  ? CO2 26 04/20/2021  ? ?Lab Results  ?Component Value Date  ? ALT 18 04/20/2021  ? AST 28 04/20/2021  ? ALKPHOS 130 (H) 04/20/2021  ? BILITOT 0.5 04/20/2021  ? ?Lab Results   ?Component Value Date  ? HGBA1C 5.7 (H) 04/20/2021  ? HGBA1C 5.5 09/17/2015  ? HGBA1C 5.7 01/24/2006  ? ?Lab Results  ?Component Value Date  ? INSULIN 11.4 04/20/2021  ? ?Lab Results  ?Component Value Date  ? TSH 4.270 04/20/2021  ? ?Lab Results  ?Component Value Date  ? CHOL 264 (H) 04/20/2021  ? HDL 59 04/20/2021  ? LDLCALC 168 (H) 04/20/2021  ? LDLDIRECT 150.0 09/06/2019  ? TRIG 200 (H) 04/20/2021  ? CHOLHDL 5 09/06/2019  ? ?Lab Results  ?Component Value Date  ? VD25OH 29.2 (L) 04/20/2021  ? ?Lab Results  ?Component Value Date  ? WBC 6.4 04/20/2021  ? HGB 13.8 04/20/2021  ? HCT 42.4 04/20/2021  ? MCV 86 04/20/2021  ? PLT 278 04/20/2021  ? ?Lab Results  ?Component Value Date  ? IRON 62 09/06/2019  ? TIBC 401 09/06/2019  ? FERRITIN 16 09/06/2019  ? ?Attestation Statements:  ? ?Reviewed by clinician on day of visit: allergies, medications, problem list, medical history, surgical history, family history, social history, and previous encounter notes. ? ?I, Lizbeth Bark, RMA, am acting as Location manager for [name], FNP. ? ?I have reviewed the above documentation for accuracy and completeness, and I agree with the above. [name], FNP ? ?

## 2021-06-16 ENCOUNTER — Telehealth: Payer: Self-pay | Admitting: *Deleted

## 2021-06-16 NOTE — Telephone Encounter (Signed)
L/m for patient delivery of Xolair from patient assistance approval for 4/11 and can reach out to make appt ?

## 2021-06-16 NOTE — Telephone Encounter (Signed)
error 

## 2021-06-21 ENCOUNTER — Ambulatory Visit
Admission: RE | Admit: 2021-06-21 | Discharge: 2021-06-21 | Disposition: A | Discharge status: Home / Self Care | Payer: Medicare Other | Source: Ambulatory Visit | Attending: Family Medicine | Admitting: Family Medicine

## 2021-06-21 ENCOUNTER — Telehealth: Payer: Self-pay | Admitting: *Deleted

## 2021-06-21 DIAGNOSIS — R0602 Shortness of breath: Secondary | ICD-10-CM | POA: Diagnosis not present

## 2021-06-21 NOTE — Telephone Encounter (Signed)
Can you please put in a 2 view chest xray with a stat read? Thank you

## 2021-06-21 NOTE — Telephone Encounter (Signed)
Chest x ray has been ordered for stat read. Called patient and informed, patient is going to go to [place] today to have chest x ray performed.  ?

## 2021-06-21 NOTE — Telephone Encounter (Signed)
Patient was given antibiotics in March for an upper respiratory infection and then was experiencing issues with wheezing and shortness of breath, her Pulmonologist gave her a short prednisone burst of prednisone towards the end of March. She states she has finished it and is still having issues with shortness of breath and now fatigue, I asked if she tested for COVID and she said no. She is going to take an at home test to be safe. She is using her Advair inhaler and I advised to increase her albuterol to 4 puffs every 4-6 hours per Dr. Ernst Bowler. Do you have any further recommendation?  ?

## 2021-06-21 NOTE — Telephone Encounter (Signed)
Any fever, sweats or chills? Sick contacts? Is she coughing? Any mucus production or dry cough

## 2021-06-21 NOTE — Telephone Encounter (Signed)
Patients at home COVID test was negative. She denies any fever, body aches, or chills. She does have a cough, she states that it is more of a dry cough but she does have some mucous that is yellow and a little brown. She states that she is going to [place] this week to visit with her daughter and she worries that with her shortness of breath she may have difficulty breathing while walking through the airport.  ?

## 2021-06-21 NOTE — Addendum Note (Signed)
Addended by: Chip Boer R on: 06/21/2021 04:19 PM ? ? Modules accepted: Orders ? ?

## 2021-06-21 NOTE — Progress Notes (Signed)
Can you please let this patient know that her chest xray is clear. Lets add a Flovent 110-2 puffs twice a day with a spacer to her existing routine. Please have her call the clinic with any worsening of symptoms or if no improvement in her symptoms. Thank you

## 2021-06-22 ENCOUNTER — Other Ambulatory Visit: Payer: Self-pay

## 2021-06-22 ENCOUNTER — Telehealth: Payer: Self-pay

## 2021-06-22 MED ORDER — FLUOXETINE HCL 20 MG PO CAPS: ORAL_CAPSULE | ORAL | 1 refills | Status: DC

## 2021-06-22 NOTE — Telephone Encounter (Signed)
Sent in today 

## 2021-06-22 NOTE — Telephone Encounter (Signed)
Pt calling requesting a refill for: ?FLUoxetine (PROZAC) 20 MG capsule ? ?Pharmacy: ?[facility] [place], [address] AT [address] ? ?LOV 05/25/21 ?ROV 05/27/22 ?

## 2021-06-24 ENCOUNTER — Ambulatory Visit (INDEPENDENT_AMBULATORY_CARE_PROVIDER_SITE_OTHER): Payer: Medicare Other | Admitting: Nurse Practitioner

## 2021-06-25 ENCOUNTER — Other Ambulatory Visit (INDEPENDENT_AMBULATORY_CARE_PROVIDER_SITE_OTHER): Payer: Self-pay | Admitting: Family Medicine

## 2021-06-25 ENCOUNTER — Other Ambulatory Visit: Payer: Self-pay | Admitting: *Deleted

## 2021-06-25 ENCOUNTER — Telehealth: Payer: Self-pay

## 2021-06-25 DIAGNOSIS — E559 Vitamin D deficiency, unspecified: Secondary | ICD-10-CM

## 2021-06-25 MED ORDER — FLUTICASONE PROPIONATE HFA 110 MCG/ACT IN AERO: INHALATION_SPRAY | RESPIRATORY_TRACT | 5 refills | Status: DC

## 2021-06-25 MED ORDER — FLOVENT HFA 110 MCG/ACT IN AERO: INHALATION_SPRAY | RESPIRATORY_TRACT | 5 refills | Status: DC

## 2021-06-25 NOTE — Telephone Encounter (Signed)
Patient returned call - DOB verified - advised of chest x-ray results and provider notations. ? ?Patient states she's presently out of town - electronically sending Rx Flovent 110 mcg 2 puffs twice a day with a spacer to the [facility]. ? ?Patient verbalized understanding, no further questions at this time. ?

## 2021-06-25 NOTE — Telephone Encounter (Signed)
-----   Message from Dara Hoyer, FNP sent at 06/21/2021 10:41 PM EDT ----- ?Can you please let this patient know that her chest xray is clear. Lets add a Flovent 110-2 puffs twice a day with a spacer to her existing routine. Please have her call the clinic with any worsening of symptoms or if no improvement in her symptoms.  ?Thank you ?

## 2021-06-30 ENCOUNTER — Ambulatory Visit (INDEPENDENT_AMBULATORY_CARE_PROVIDER_SITE_OTHER): Payer: Medicare Other

## 2021-06-30 DIAGNOSIS — J4540 Moderate persistent asthma, uncomplicated: Secondary | ICD-10-CM

## 2021-06-30 MED ORDER — OMALIZUMAB 150 MG/ML ~~LOC~~ SOSY
225.0000 mg | PREFILLED_SYRINGE | SUBCUTANEOUS | Status: DC
  Administered 2021-06-30 – 2021-11-25 (×10): 225 mg via SUBCUTANEOUS

## 2021-06-30 NOTE — Addendum Note (Signed)
Addended by: Carin Hock on: 06/30/2021 02:13 PM ? ? Modules accepted: Orders ? ?

## 2021-07-07 ENCOUNTER — Ambulatory Visit: Payer: Medicare Other | Admitting: Internal Medicine

## 2021-07-07 ENCOUNTER — Encounter: Payer: Self-pay | Admitting: Internal Medicine

## 2021-07-07 VITALS — BP 126/68 | HR 67 | Temp 97.9°F | Ht 65.0 in | Wt 195.8 lb

## 2021-07-07 DIAGNOSIS — R768 Other specified abnormal immunological findings in serum: Secondary | ICD-10-CM | POA: Diagnosis not present

## 2021-07-07 DIAGNOSIS — J4550 Severe persistent asthma, uncomplicated: Secondary | ICD-10-CM | POA: Diagnosis not present

## 2021-07-07 MED ORDER — SPIRIVA RESPIMAT 1.25 MCG/ACT IN AERS: 2.0000 | INHALATION_SPRAY | Freq: Every day | RESPIRATORY_TRACT | 3 refills | Status: DC

## 2021-07-07 NOTE — Progress Notes (Signed)
? ?      ?ONYINYECHI HUANTE    [id number]    1955-05-02 ? ?Primary Care Physician:Burns, Claudina Lick, MD ?Date of Appointment: 07/07/2021 ?Established Patient Visit ? ?Chief complaint:   ?Chief Complaint  ?Patient presents with  ? Follow-up  ?  Sob-same, cough-yellow  ? ? ? ?HPI: ?Dana Mccoy is a 66 y.o. woman with moderate persistent asthma with elevated IgE and sensitivity to aeroallergens.  ?Allergist - Dr. Ernst Bowler ? ? ?Interval Updates: ?Here for follow up after starting xolair last week.  ?Has neeed prednisone twice since December for asthma exacerbation.  ?She is feeling frustrated today because she feels very limited in her ADLs due to dyspnea, cough and fatigue.  ?Today she is feeling ok, but not most days averaged over the last two weeks.  ? ?Current Regimen: Advair 230 HFA 2 puffs twice a day, flovent 110 2 puffs twice a day, albuterol ?Asthma Triggers: She does have environmental allergies in the fall - dust, mold, grass. ?URIs ?Exacerbations in the last year: multiple requiring prednisone in the last year ?History of hospitalization or intubation: never intubated ?Allergy Testing: yes food allergies tested multiple - all negative. Eats nuts fine. Had SPT done for environmental allergies many years ago.  ?GERD: denies reflux/heart burn ?Allergic Rhinitis: yes on claritin and montelukast ?Does have unexplained recurrent urticaria ?ACT:  ?Asthma Control Test ACT Total Score  ?07/07/2021 ?11:34 AM 9  ?02/23/2021 ?10:56 AM 16  ? ?FeNO: March 2023 - 11 ppb ? ? ? ?Social history: ? 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  ?Exposures: lives at home alone with dog.  ?Smoking history: former smoker 15 pack years, quit in 1987.  ? ?I have reviewed the patient's family social and past medical history and updated as appropriate.  ? ?Past Medical History:  ?Diagnosis Date  ? Anemia   ? Angio-edema   ? Anxiety   ? Asthma exacerbation, mild  02/12/2020  ? Back pain   ? Cervical stenosis of spine   ? Frequent headaches   ? GERD (gastroesophageal reflux disease)   ? Hyperlipidemia   ? Hypertension   ? IBS (irritable bowel syndrome)   ? Joint pain   ? Metabolic syndrome   ? Osteoarthritis   ? Other fatigue   ? Palpitations   ? Prediabetes   ? Reactive airway disease   ? post CAP  ? Shortness of breath   ? Shortness of breath on exertion   ? Swallowing difficulty   ? ? ?Past Surgical History:  ?Procedure Laterality Date  ? CARDIAC CATHETERIZATION  1997  ? negative  ? COLONOSCOPY  2007  ? Dr Cristina Gong  ? DILATION AND CURETTAGE OF UTERUS    ? UPPER GI ENDOSCOPY    ? X 2  ? WISDOM TOOTH EXTRACTION    ? ? ?Family History  ?Adopted: Yes  ?Problem Relation Age of Onset  ? Cancer Mother   ? Allergic rhinitis Mother   ? Asthma Neg Hx   ? Eczema Neg Hx   ? Urticaria Neg Hx   ? ? ?Social History  ? ?Occupational History  ? Occupation: Equities trader  ?  Employer: [employer]  ?  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 Smoking status: Former  ?  Packs/day: 1.50  ?  Years: 12.00  ?  Pack years: 18.00  ?  Types: Cigarettes  ?  Start date: 03/14/1973  ?  Quit date: 03/14/1985  ?  Years since quitting: 36.3  ? Smokeless tobacco: Never  ? Tobacco comments:  ?  smoked 1975-1986, up to 1.5 ppd  ?Vaping Use  ? Vaping Use: Never used  ?Substance and Sexual Activity  ? Alcohol use: No  ?  Comment: very rarely  ? Drug use: No  ? Sexual activity: Not Currently  ? ? ? ?Physical Exam: ?Blood pressure 126/68, pulse 67, temperature 97.9 ?F (36.6 ?C), temperature source Temporal, height '5\' 5"'$  (1.651 m), weight 195 lb 12.8 oz (88.8 kg), SpO2 92 %. ? ?Gen:      Voice hoarseness ?ENT: no nasal polyps ?Lungs:    no wheezes or crackles, good air entry bilterally ?CV:         RRR no mrg ? ? ?Data Reviewed: ?Imaging: ?I have personally reviewed the chest xray 11/2020 no acute pulmonary process ? ?PFTs: ? ?   ? View : No data to display.  ?  ?  ?  ? ?Feno is 11 ppb today.  ?Spirometry normal  10/27/20 ? ?Labs: ?IgE 144 in Nov 2022 ? ?Immunization status: ?Immunization History  ?Administered Date(s) Administered  ? Influenza, High Dose Seasonal PF 12/10/2020  ? Influenza,inj,Quad PF,6+ Mos 11/16/2016  ? Influenza-Unspecified 12/12/2017  ? Pension scheme manager 65yr & up 12/24/2020  ? Pneumococcal Polysaccharide-23 07/31/2012  ? ? ?External Records Personally Reviewed: pulmonary, allergy,  ? ?Assessment:  ?Severe persistent asthma not well controlled with dependence on oral glucocorticoids ?Allergic rhinitis ? ? ?Plan/Recommendations: ? ?She is already on a very aggressive ICS regimen. Continue xolair. Can try spiriva once daily in addition to give the xolair more time to work.  ?Continue advair HFA and flovent with spacer ?Continue albuterol prn ?Continue montelukast with claritin ? ?Could consider dupixent if no improvement with xolair.  ? ?Return to Care: ?Return in about 3 months (around 10/06/2021). ? ? ?NLenice Llamas MD ?Pulmonary and Critical Care Medicine ?[place]?[phone] ? ? ? ? ? ?

## 2021-07-07 NOTE — Patient Instructions (Addendum)
Please schedule follow up scheduled with myself in 3 months.  If my schedule is not open yet, we will contact you with a reminder closer to that time. Please call [phone] if you haven't heard from [name] a month before.  ? ?Add spiriva 2 puffs once a day to your inhaler regimen. Continue advair and flovent. ? ?Let's see if giving the xolair more time works. ? ?Continue flonase. I also want you to be more aggressive about nasal saline lavage you can use nasal saline spray or a neti pot with distilled or boiled water to help flush out your sinuses.  ? ?Flonase - 1 spray on each side of your nose twice a day for first week, then 1 spray on each side.  ? Instructions for use: ?If you also use a saline nasal spray or rinse, use that first. ?Position the head with the chin slightly tucked. Use the right hand to spray into the left nostril and the right hand to spray into the left nostril.   Point the bottle away from the septum of your nose (cartilage that divides the two sides of your nose).  ?Hold the nostril closed on the opposite side from where you will spray ?Spray once and gently sniff to pull the medicine into the higher parts of your nose.  Don't sniff too hard as the medicine will drain down the back of your throat instead. ?Repeat with a second spray on the same side if prescribed. ?Repeat on the other side of your nose. ? ?

## 2021-07-14 ENCOUNTER — Ambulatory Visit: Payer: Medicare Other

## 2021-07-14 DIAGNOSIS — J4540 Moderate persistent asthma, uncomplicated: Secondary | ICD-10-CM

## 2021-07-19 ENCOUNTER — Ambulatory Visit (INDEPENDENT_AMBULATORY_CARE_PROVIDER_SITE_OTHER): Payer: Medicare Other | Admitting: Nurse Practitioner

## 2021-07-19 ENCOUNTER — Encounter (INDEPENDENT_AMBULATORY_CARE_PROVIDER_SITE_OTHER): Payer: Self-pay | Admitting: Nurse Practitioner

## 2021-07-19 VITALS — BP 102/62 | HR 60 | Temp 97.6°F | Ht 65.0 in | Wt 191.0 lb

## 2021-07-19 DIAGNOSIS — E7849 Other hyperlipidemia: Secondary | ICD-10-CM

## 2021-07-19 DIAGNOSIS — Z6831 Body mass index (BMI) 31.0-31.9, adult: Secondary | ICD-10-CM

## 2021-07-19 DIAGNOSIS — E559 Vitamin D deficiency, unspecified: Secondary | ICD-10-CM | POA: Diagnosis not present

## 2021-07-19 DIAGNOSIS — E669 Obesity, unspecified: Secondary | ICD-10-CM | POA: Diagnosis not present

## 2021-07-19 DIAGNOSIS — R7303 Prediabetes: Secondary | ICD-10-CM

## 2021-07-19 MED ORDER — METFORMIN HCL 500 MG PO TABS: 500.0000 mg | ORAL_TABLET | Freq: Every day | ORAL | 0 refills | Status: DC

## 2021-07-19 MED ORDER — VITAMIN D (ERGOCALCIFEROL) 1.25 MG (50000 UNIT) PO CAPS: 50000.0000 [IU] | ORAL_CAPSULE | ORAL | 0 refills | Status: DC

## 2021-07-20 LAB — COMPREHENSIVE METABOLIC PANEL
ALT: 18 IU/L (ref 0–32)
AST: 23 IU/L (ref 0–40)
Albumin/Globulin Ratio: 1.5 (ref 1.2–2.2)
Albumin: 4.4 g/dL (ref 3.8–4.8)
BUN/Creatinine Ratio: 18 (ref 12–28)
BUN: 16 mg/dL (ref 8–27)
Bilirubin Total: 0.3 mg/dL (ref 0.0–1.2)
Creatinine, Ser: 0.91 mg/dL (ref 0.57–1.00)
Globulin, Total: 3 g/dL (ref 1.5–4.5)
Glucose: 81 mg/dL (ref 70–99)
Total Protein: 7.4 g/dL (ref 6.0–8.5)
eGFR: 70 mL/min/{1.73_m2} (ref >59)

## 2021-07-20 LAB — LIPID PANEL WITH LDL/HDL RATIO
Cholesterol, Total: 168 mg/dL (ref 100–199)
HDL: 52 mg/dL (ref >39)
LDL Chol Calc (NIH): 100 mg/dL — ABNORMAL HIGH (ref 0–99)
LDL/HDL Ratio: 1.9 ratio (ref 0.0–3.2)
Triglycerides: 83 mg/dL (ref 0–149)
VLDL Cholesterol Cal: 16 mg/dL (ref 5–40)

## 2021-07-20 LAB — HEMOGLOBIN A1C
Est. average glucose Bld gHb Est-mCnc: 117 mg/dL
Hgb A1c MFr Bld: 5.7 % — ABNORMAL HIGH (ref 4.8–5.6)

## 2021-07-20 LAB — VITAMIN D 25 HYDROXY (VIT D DEFICIENCY, FRACTURES): Vit D, 25-Hydroxy: 76.8 ng/mL (ref 30.0–100.0)

## 2021-07-21 ENCOUNTER — Other Ambulatory Visit: Payer: Self-pay | Admitting: Allergy & Immunology

## 2021-07-21 NOTE — Progress Notes (Signed)
? ? ? ?Chief Complaint:  ? ?OBESITY ?Dana Mccoy is here to discuss her progress with her obesity treatment plan along with follow-up of her obesity related diagnoses. Dana Mccoy is on the Category 2 Plan and keeping a food journal and adhering to recommended goals of 300-400 calories and 30 grams of protein at lunch and states she is following her eating plan approximately 97% of the time. Dana Mccoy states she is walking for 15 minutes 4 times per week. ? ?Today's visit was #: 5 ?Starting weight: 203 lbs ?Starting date: 04/20/2021 ?Today's weight: 191 lbs ?Today's date: 07/19/2021 ?Total lbs lost to date: 12 lbs ?Total lbs lost since last in-office visit: 4 lbs ? ?Interim History: Dana Mccoy has done well weight loss since her last visit. She started Spiriva a couple weeks and is struggling with hunger in the evening since starting Spiriva. She went to a wedding this past weekend. She's craving salty foods.  ? ?Subjective:  ? ?1. Vitamin D deficiency ?Dana Mccoy is currently taking Vitamin D 50,000 IU weekly. She denies side effects nausea, vomiting, and muscle weakness.  ? ?2. Other hyperlipidemia ?Dana Mccoy is taking Crestor 5 mg currently. She denies side effects.  ? ?3. Pre-diabetes ?Dana Mccoy's last A1C was 5.7. She reports polyphagia and cravings.  ? ?Assessment/Plan:  ? ?1. Vitamin D deficiency ?Low Vitamin D level contributes to fatigue and are associated with obesity, breast, and colon cancer. We will refill  prescription Vitamin D 50,000 IU every week for 1 month with no refills and Dana Mccoy will follow-up for routine testing of Vitamin D, at least 2-3 times per year to avoid over-replacement. Labs were obtained today.  ? ?- Vitamin D, Ergocalciferol, (DRISDOL) 1.25 MG (50000 UNIT) CAPS capsule; Take 1 capsule (50,000 Units total) by mouth every 7 (seven) days.  Dispense: 4 capsule; Refill: 0 ?- Comprehensive metabolic panel ?- VITAMIN D 25 Hydroxy (Vit-D Deficiency, Fractures) ? ?2. Other hyperlipidemia ?Cardiovascular risk and specific lipid/LDL  goals reviewed.  Labs obtained today. Dana Mccoy will continue to follow up with primary care physician. She will continue medication as directed. We discussed several lifestyle modifications today and Dana Mccoy will continue to work on diet, exercise and weight loss efforts. Orders and follow up as documented in patient record.  ? ?Counseling ?Intensive lifestyle modifications are the first line treatment for this issue. ?Dietary changes: Increase soluble fiber. Decrease simple carbohydrates. ?Exercise changes: Moderate to vigorous-intensity aerobic activity 150 minutes per week if tolerated. ?Lipid-lowering medications: see documented in medical record. ? ?- Comprehensive metabolic panel ?- Lipid Panel With LDL/HDL Ratio ? ?3. Pre-diabetes ?Dana Mccoy agrees to start Metformin 500 mg for 1 month with no refills. Labs obtained today. We discussed side effects. She will continue to work on weight loss, exercise, and decreasing simple carbohydrates to help decrease the risk of diabetes.  ? ?- metFORMIN (GLUCOPHAGE) 500 MG tablet; Take 1 tablet (500 mg total) by mouth daily with breakfast.  Dispense: 30 tablet; Refill: 0 ?- Hemoglobin A1c ? ?4. Obesity with current BMI of 31.9 ?Dana Mccoy is currently in the action stage of change. As such, her goal is to continue with weight loss efforts. She has agreed to the Category 2 Plan.  ? ?Exercise goals:  As is.  ? ?Behavioral modification strategies: increasing water intake and no skipping meals. ? ?Dana Mccoy has agreed to follow-up with our clinic in 4 weeks. She was informed of the importance of frequent follow-up visits to maximize her success with intensive lifestyle modifications for her multiple health conditions.  ? ?Objective:  ? ?  Blood pressure 102/62, pulse 60, temperature 97.6 ?F (36.4 ?C), height '5\' 5"'$  (1.651 m), weight 191 lb (86.6 kg), SpO2 96 %. ?Body mass index is 31.78 kg/m?. ? ?General: Cooperative, alert, well developed, in no acute distress. ?HEENT: Conjunctivae and lids  unremarkable. ?Cardiovascular: Regular rhythm.  ?Lungs: Normal work of breathing. ?Neurologic: No focal deficits.  ? ?Lab Results  ?Component Value Date  ? CREATININE 0.91 07/19/2021  ? BUN 16 07/19/2021  ? NA CANCELED 07/19/2021  ? K CANCELED 07/19/2021  ? CL CANCELED 07/19/2021  ? CO2 CANCELED 07/19/2021  ? ?Lab Results  ?Component Value Date  ? ALT 18 07/19/2021  ? AST 23 07/19/2021  ? ALKPHOS CANCELED 07/19/2021  ? BILITOT 0.3 07/19/2021  ? ?Lab Results  ?Component Value Date  ? HGBA1C 5.7 (H) 07/19/2021  ? HGBA1C 5.7 (H) 04/20/2021  ? HGBA1C 5.5 09/17/2015  ? HGBA1C 5.7 01/24/2006  ? ?Lab Results  ?Component Value Date  ? INSULIN 11.4 04/20/2021  ? ?Lab Results  ?Component Value Date  ? TSH 4.270 04/20/2021  ? ?Lab Results  ?Component Value Date  ? CHOL 168 07/19/2021  ? HDL 52 07/19/2021  ? LDLCALC 100 (H) 07/19/2021  ? LDLDIRECT 150.0 09/06/2019  ? TRIG 83 07/19/2021  ? CHOLHDL 5 09/06/2019  ? ?Lab Results  ?Component Value Date  ? VD25OH 76.8 07/19/2021  ? VD25OH 29.2 (L) 04/20/2021  ? ?Lab Results  ?Component Value Date  ? WBC 6.4 04/20/2021  ? HGB 13.8 04/20/2021  ? HCT 42.4 04/20/2021  ? MCV 86 04/20/2021  ? PLT 278 04/20/2021  ? ?Lab Results  ?Component Value Date  ? IRON 62 09/06/2019  ? TIBC 401 09/06/2019  ? FERRITIN 16 09/06/2019  ? ? ?Obesity Behavioral Intervention:  ? ?Approximately 15 minutes were spent on the discussion below. ? ?ASK: ?We discussed the diagnosis of obesity with Dana Mccoy today and Dana Mccoy agreed to give [name] permission to discuss obesity behavioral modification therapy today. ? ?ASSESS: ?Dana Mccoy has the diagnosis of obesity and her BMI today is 191 lbs. Dana Mccoy is in the action stage of change.  ? ?ADVISE: ?Dana Mccoy was educated on the multiple health risks of obesity as well as the benefit of weight loss to improve her health. She was advised of the need for long term treatment and the importance of lifestyle modifications to improve her current health and to decrease her risk of future health  problems. ? ?AGREE: ?Multiple dietary modification options and treatment options were discussed and Dana Mccoy agreed to follow the recommendations documented in the above note. ? ?ARRANGE: ?Dana Mccoy was educated on the importance of frequent visits to treat obesity as outlined per CMS and USPSTF guidelines and agreed to schedule her next follow up appointment today. ? ?Attestation Statements:  ? ?Reviewed by clinician on day of visit: allergies, medications, problem list, medical history, surgical history, family history, social history, and previous encounter notes. ? ?I, Dana Mccoy, RMA, am acting as Location manager for [name], FNP. ? ?I have reviewed the above documentation for accuracy and completeness, and I agree with the above. [name], FNP  ?

## 2021-07-22 ENCOUNTER — Telehealth (INDEPENDENT_AMBULATORY_CARE_PROVIDER_SITE_OTHER): Payer: Self-pay

## 2021-07-22 ENCOUNTER — Ambulatory Visit (INDEPENDENT_AMBULATORY_CARE_PROVIDER_SITE_OTHER): Payer: Medicare Other | Admitting: Family Medicine

## 2021-07-22 ENCOUNTER — Encounter: Payer: Self-pay | Admitting: Family Medicine

## 2021-07-22 ENCOUNTER — Telehealth (INDEPENDENT_AMBULATORY_CARE_PROVIDER_SITE_OTHER): Payer: Self-pay | Admitting: Nurse Practitioner

## 2021-07-22 VITALS — BP 116/78 | HR 60 | Temp 97.6°F | Ht 65.0 in | Wt 194.0 lb

## 2021-07-22 DIAGNOSIS — R5383 Other fatigue: Secondary | ICD-10-CM

## 2021-07-22 DIAGNOSIS — Z23 Encounter for immunization: Secondary | ICD-10-CM

## 2021-07-22 DIAGNOSIS — R7303 Prediabetes: Secondary | ICD-10-CM

## 2021-07-22 DIAGNOSIS — I10 Essential (primary) hypertension: Secondary | ICD-10-CM | POA: Diagnosis not present

## 2021-07-22 DIAGNOSIS — S6991XA Unspecified injury of right wrist, hand and finger(s), initial encounter: Secondary | ICD-10-CM | POA: Diagnosis not present

## 2021-07-22 NOTE — Telephone Encounter (Signed)
Pt wanted to know if she can come in to get her labs redone. Pt stated she received a message from her mychart and has another appt in the area and wanted to see if she needed to set a time up to come in today.  ?

## 2021-07-22 NOTE — Telephone Encounter (Signed)
[name], [place] ?Nurse Practitioner  ? ?Conversation ?(Newest Message First) ?Jul 22, 2021 ?Marcille Blanco ?to Me   ?CS ?  12:04 PM ?Note ?Last Ov with Colletta Maryland  ?  ? ?  ? ?AR ?  11:34 AM ?Pricilla Handler routed this conversation to [facility]  ?Pricilla Handler ?  ?AR ?  11:34 AM ?Note ?Pt wanted to know if she can come in to get her labs redone. Pt stated she received a message from her mychart and has another appt in the area and wanted to see if she needed to set a time up to come in today.   ?  ? ?

## 2021-07-22 NOTE — Patient Instructions (Signed)
Tdap (Tetanus, Diphtheria, Pertussis) Vaccine: What You Need to Know 1. Why get vaccinated? Tdap vaccine can prevent tetanus, diphtheria, and pertussis. Diphtheria and pertussis spread from person to person. Tetanus enters the body through cuts or wounds. TETANUS (T) causes painful stiffening of the muscles. Tetanus can lead to serious health problems, including being unable to open the mouth, having trouble swallowing and breathing, or death. DIPHTHERIA (D) can lead to difficulty breathing, heart failure, paralysis, or death. PERTUSSIS (aP), also known as "whooping cough," can cause uncontrollable, violent coughing that makes it hard to breathe, eat, or drink. Pertussis can be extremely serious especially in babies and young children, causing pneumonia, convulsions, brain damage, or death. In teens and adults, it can cause weight loss, loss of bladder control, passing out, and rib fractures from severe coughing. 2. Tdap vaccine Tdap is only for children 7 years and older, adolescents, and adults.  Adolescents should receive a single dose of Tdap, preferably at age 11 or 12 years. Pregnant people should get a dose of Tdap during every pregnancy, preferably during the early part of the third trimester, to help protect the newborn from pertussis. Infants are most at risk for severe, life-threatening complications from pertussis. Adults who have never received Tdap should get a dose of Tdap. Also, adults should receive a booster dose of either Tdap or Td (a different vaccine that protects against tetanus and diphtheria but not pertussis) every 10 years, or after 5 years in the case of a severe or dirty wound or burn. Tdap may be given at the same time as other vaccines. 3. Talk with your health care provider Tell your vaccine provider if the person getting the vaccine: Has had an allergic reaction after a previous dose of any vaccine that protects against tetanus, diphtheria, or pertussis, or has any  severe, life-threatening allergies Has had a coma, decreased level of consciousness, or prolonged seizures within 7 days after a previous dose of any pertussis vaccine (DTP, DTaP, or Tdap) Has seizures or another nervous system problem Has ever had Guillain-Barr Syndrome (also called "GBS") Has had severe pain or swelling after a previous dose of any vaccine that protects against tetanus or diphtheria In some cases, your health care provider may decide to postpone Tdap vaccination until a future visit. People with minor illnesses, such as a cold, may be vaccinated. People who are moderately or severely ill should usually wait until they recover before getting Tdap vaccine.  Your health care provider can give you more information. 4. Risks of a vaccine reaction Pain, redness, or swelling where the shot was given, mild fever, headache, feeling tired, and nausea, vomiting, diarrhea, or stomachache sometimes happen after Tdap vaccination. People sometimes faint after medical procedures, including vaccination. Tell your provider if you feel dizzy or have vision changes or ringing in the ears.  As with any medicine, there is a very remote chance of a vaccine causing a severe allergic reaction, other serious injury, or death. 5. What if there is a serious problem? An allergic reaction could occur after the vaccinated person leaves the clinic. If you see signs of a severe allergic reaction (hives, swelling of the face and throat, difficulty breathing, a fast heartbeat, dizziness, or weakness), call 9-1-1 and get the person to the nearest hospital. For other signs that concern you, call your health care provider.  Adverse reactions should be reported to the Vaccine Adverse Event Reporting System (VAERS). Your health care provider will usually file this report, or you   can do it yourself. Visit the VAERS website at www.vaers.hhs.gov or call 1-800-822-7967. VAERS is only for reporting reactions, and VAERS staff  members do not give medical advice. 6. The National Vaccine Injury Compensation Program The National Vaccine Injury Compensation Program (VICP) is a federal program that was created to compensate people who may have been injured by certain vaccines. Claims regarding alleged injury or death due to vaccination have a time limit for filing, which may be as short as two years. Visit the VICP website at www.hrsa.gov/vaccinecompensation or call 1-800-338-2382 to learn about the program and about filing a claim. 7. How can I learn more? Ask your health care provider. Call your local or state health department. Visit the website of the Food and Drug Administration (FDA) for vaccine package inserts and additional information at www.fda.gov/vaccines-blood-biologics/vaccines. Contact the Centers for Disease Control and Prevention (CDC): Call 1-800-232-4636 (1-800-CDC-INFO) or Visit CDC's website at www.cdc.gov/vaccines. Source: CDC Vaccine Information Statement Tdap (Tetanus, Diphtheria, Pertussis) Vaccine (10/18/2019) This same material is available at www.cdc.gov for no charge. This information is not intended to replace advice given to you by your health care provider. Make sure you discuss any questions you have with your health care provider. Document Revised: 01/27/2021 Document Reviewed: 11/30/2020 Elsevier Patient Education  2023 Elsevier Inc.  

## 2021-07-22 NOTE — Telephone Encounter (Signed)
Last Ov with Colletta Maryland

## 2021-07-22 NOTE — Progress Notes (Signed)
? ?Subjective:  ? ? ? Patient ID: Dana Mccoy, female    DOB: 08-11-1955, 66 y.o.   MRN: [id number] ? ?Chief Complaint  ?Patient presents with  ? Hand Injury  ?  Hand laceration from a rusty fence, would like tetanus shot  ? ? ?HPI ?Patient is in today for a wound of her right hand. States she cut her hand on a rusty fence this morning. She would like a tetanus vaccine. Cannot recall her last one.  ?She cleaned the wound, applied antibiotic ointment and has a Band-Aid on the area. ?Feels fine otherwise ? ?Health Maintenance Due  ?Topic Date Due  ? Pneumonia Vaccine 37+ Years old (2 - PCV) 07/31/2013  ? ? ?Past Medical History:  ?Diagnosis Date  ? Anemia   ? Angio-edema   ? Anxiety   ? Asthma exacerbation, mild 02/12/2020  ? Back pain   ? Cervical stenosis of spine   ? Frequent headaches   ? GERD (gastroesophageal reflux disease)   ? Hyperlipidemia   ? Hypertension   ? IBS (irritable bowel syndrome)   ? Joint pain   ? Metabolic syndrome   ? Osteoarthritis   ? Other fatigue   ? Palpitations   ? Prediabetes   ? Reactive airway disease   ? post CAP  ? Shortness of breath   ? Shortness of breath on exertion   ? Swallowing difficulty   ? ? ?Past Surgical History:  ?Procedure Laterality Date  ? CARDIAC CATHETERIZATION  1997  ? negative  ? COLONOSCOPY  2007  ? Dr Cristina Gong  ? DILATION AND CURETTAGE OF UTERUS    ? UPPER GI ENDOSCOPY    ? X 2  ? WISDOM TOOTH EXTRACTION    ? ? ?Family History  ?Adopted: Yes  ?Problem Relation Age of Onset  ? Cancer Mother   ? Allergic rhinitis Mother   ? Asthma Neg Hx   ? Eczema Neg Hx   ? Urticaria Neg Hx   ? ? ?Social History  ? ?Socioeconomic History  ? Marital status: Single  ?  Spouse name: Not on file  ? Number of children: Not on file  ? Years of education: Not on file  ? Highest education level: Bachelor's degree (e.g., BA, AB, BS)  ?Occupational History  ? Occupation: Equities trader  ?  Employer: [employer]  ?  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 Smoking status: Former   ?  Packs/day: 1.50  ?  Years: 12.00  ?  Pack years: 18.00  ?  Types: Cigarettes  ?  Start date: 03/14/1973  ?  Quit date: 03/14/1985  ?  Years since quitting: 36.3  ? Smokeless tobacco: Never  ? Tobacco comments:  ?  smoked 1975-1986, up to 1.5 ppd  ?Vaping Use  ? Vaping Use: Never used  ?Substance and Sexual Activity  ? Alcohol use: No  ?  Comment: very rarely  ? Drug use: No  ? Sexual activity: Not Currently  ?Other Topics Concern  ? Not on file  ?Social History Narrative  ? Exercise: 2-4 times a week  ? ?Social Determinants of Health  ? ?Financial Resource Strain: Not on file  ?Food Insecurity: Not on file  ?Transportation Needs: Not on file  ?Physical Activity: Not on file  ?Stress: Not on file  ?Social Connections: Not on file  ?Intimate Partner Violence: Not on file  ? ? ?Outpatient Medications Prior to Visit  ?Medication Sig Dispense Refill  ? acetaminophen (  TYLENOL) 500 MG tablet Take 500 mg by mouth as needed.    ? albuterol (VENTOLIN HFA) 108 (90 Base) MCG/ACT inhaler INHALE 1-2 PUFFS INTO THE LUNGS EVERY 4 HOURS AS NEEDED FOR WHEEZING OR SHORTNESS OF BREATH 8.5 g 1  ? Calcium Carbonate-Vit D-Min (CALCIUM 600+D PLUS MINERALS) 600-400 MG-UNIT CHEW Chew 1 each by mouth daily.    ? EPINEPHrine 0.3 mg/0.3 mL IJ SOAJ injection Inject 0.3 mg into the muscle as needed for anaphylaxis. 1 each 1  ? FLOVENT HFA 110 MCG/ACT inhaler 2 puffs twice a day with spacer. 12 g 5  ? FLUoxetine (PROZAC) 20 MG capsule TAKE 1 CAPSULE BY MOUTH EVERY DAY 90 capsule 1  ? fluticasone (VERAMYST) 27.5 MCG/SPRAY nasal spray Place 2 sprays into the nose daily.    ? fluticasone-salmeterol (ADVAIR HFA) 230-21 MCG/ACT inhaler Inhale 2 puffs into the lungs 2 (two) times daily. 1 each 12  ? hydrochlorothiazide (MICROZIDE) 12.5 MG capsule TAKE 1 CAPSULE BY MOUTH ONCE DAILY 90 capsule 2  ? HYDROcodone bit-homatropine (HYCODAN) 5-1.5 MG/5ML syrup Take 5 mLs by mouth every 8 (eight) hours as needed for cough. 120 mL 0  ? LACTASE ENZYME PO Take 1  each by mouth as needed.    ? loratadine (CLARITIN) 10 MG tablet Take 1 tablet (10 mg total) by mouth 2 (two) times daily. 60 tablet 5  ? mometasone (ELOCON) 0.1 % cream APPLY A THIN LAYER TO THE AFFECTED AREA OF THE SKIN DAILY 45 g 0  ? montelukast (SINGULAIR) 10 MG tablet Take 1 tablet (10 mg total) by mouth at bedtime. 30 tablet 11  ? Multiple Vitamin (MULTIVITAMIN) tablet Take 1 tablet by mouth daily.    ? pantoprazole (PROTONIX) 40 MG tablet Take 40 mg by mouth 2 (two) times daily.    ? rosuvastatin (CRESTOR) 5 MG tablet Take 1 tablet (5 mg total) by mouth daily. 90 tablet 3  ? Tiotropium Bromide Monohydrate (SPIRIVA RESPIMAT) 1.25 MCG/ACT AERS Inhale 2 puffs into the lungs daily. 1 each 3  ? valACYclovir (VALTREX) 500 MG tablet Take 1 tablet (500 mg total) by mouth 2 (two) times daily. 180 tablet 3  ? vitamin C (ASCORBIC ACID) 500 MG tablet Take 500 mg by mouth daily.    ? Vitamin D, Ergocalciferol, (DRISDOL) 1.25 MG (50000 UNIT) CAPS capsule Take 1 capsule (50,000 Units total) by mouth every 7 (seven) days. 4 capsule 0  ? metFORMIN (GLUCOPHAGE) 500 MG tablet Take 1 tablet (500 mg total) by mouth daily with breakfast. (Patient not taking: Reported on 07/22/2021) 30 tablet 0  ? predniSONE (DELTASONE) 10 MG tablet Take '20mg'$  daily x 5 days 10 tablet 0  ? ?Facility-Administered Medications Prior to Visit  ?Medication Dose Route Frequency Provider Last Rate Last Admin  ? omalizumab Arvid Right) prefilled syringe 225 mg  225 mg Subcutaneous Q14 Days Valentina Shaggy, MD   225 mg at 07/14/21 1352  ? ? ?Allergies  ?Allergen Reactions  ? Penicillins   ?  RASH, amoxicillin  ? Lipitor [Atorvastatin] Other (See Comments)  ?  Cognitive impact  ? ? ?ROS ? ?   ?Objective:  ?  ?Physical Exam ? ?BP 116/78 (BP Location: Left Arm, Patient Position: Sitting, Cuff Size: Large)   Pulse 60   Temp 97.6 ?F (36.4 ?C) (Temporal)   Ht '5\' 5"'$  (1.651 m)   Wt 194 lb (88 kg)   SpO2 96%   BMI 32.28 kg/m?  ?Wt Readings from Last 3  Encounters:  ?07/22/21 194  lb (88 kg)  ?07/19/21 191 lb (86.6 kg)  ?07/07/21 195 lb 12.8 oz (88.8 kg)  ? ?1/2 to 2 cm superficial skin tear and partially open wound of the dorsum of her right hand.  She has good sensation and range of motion of her hand. ? ?   ?Assessment & Plan:  ? ?Problem List Items Addressed This Visit   ?None ?Visit Diagnoses   ? ? Hand injury, right, initial encounter    -  Primary  ? Relevant Orders  ? Tdap vaccine greater than or equal to 7yo IM  ? Need for Tdap vaccination      ? ?  ? ?We discussed signs of infection and to let us know if she were to develop any of them..  Counseling done on potential side effects of the Tdap vaccine.  Follow-up as needed. ? ?I have discontinued Carlton Adam Borman's predniSONE. I am also having her maintain her fluticasone, loratadine, EPINEPHrine, montelukast, Advair HFA, hydrochlorothiazide, valACYclovir, pantoprazole, vitamin C, multivitamin, LACTASE ENZYME PO, acetaminophen, Calcium 600+D Plus Minerals, rosuvastatin, HYDROcodone bit-homatropine, mometasone, FLUoxetine, Flovent HFA, Spiriva Respimat, Vitamin D (Ergocalciferol), metFORMIN, and albuterol. We will continue to administer omalizumab. ? ?No orders of the defined types were placed in this encounter. ? ? ?

## 2021-07-22 NOTE — Telephone Encounter (Signed)
Message from Haynes Bast, [employer] phlebotomist, that [place] needs to be repeated.  Call to patient, patient will come in and have labs repeated today.  CMP has to be reordered.   ?

## 2021-07-22 NOTE — Telephone Encounter (Signed)
Message from Haynes Bast, [employer] phlebotomist, that [place] needs to be repeated.  Call to patient, patient will come in and have labs repeated today.  CMP has to be reordered.   ?

## 2021-07-23 LAB — COMPREHENSIVE METABOLIC PANEL
ALT: 18 IU/L (ref 0–32)
AST: 26 IU/L (ref 0–40)
Albumin/Globulin Ratio: 2 (ref 1.2–2.2)
Albumin: 4.4 g/dL (ref 3.8–4.8)
Alkaline Phosphatase: 115 IU/L (ref 44–121)
BUN/Creatinine Ratio: 23 (ref 12–28)
BUN: 18 mg/dL (ref 8–27)
Bilirubin Total: 0.3 mg/dL (ref 0.0–1.2)
CO2: 25 mmol/L (ref 20–29)
Calcium: 10.1 mg/dL (ref 8.7–10.3)
Chloride: 104 mmol/L (ref 96–106)
Creatinine, Ser: 0.79 mg/dL (ref 0.57–1.00)
Globulin, Total: 2.2 g/dL (ref 1.5–4.5)
Glucose: 79 mg/dL (ref 70–99)
Potassium: 3.8 mmol/L (ref 3.5–5.2)
Sodium: 145 mmol/L — ABNORMAL HIGH (ref 134–144)
Total Protein: 6.6 g/dL (ref 6.0–8.5)
eGFR: 82 mL/min/{1.73_m2} (ref >59)

## 2021-08-11 ENCOUNTER — Ambulatory Visit: Payer: Medicare Other

## 2021-08-11 DIAGNOSIS — J4540 Moderate persistent asthma, uncomplicated: Secondary | ICD-10-CM

## 2021-08-16 ENCOUNTER — Ambulatory Visit (INDEPENDENT_AMBULATORY_CARE_PROVIDER_SITE_OTHER): Payer: Medicare Other | Admitting: Nurse Practitioner

## 2021-08-16 ENCOUNTER — Encounter (INDEPENDENT_AMBULATORY_CARE_PROVIDER_SITE_OTHER): Payer: Self-pay | Admitting: Nurse Practitioner

## 2021-08-16 VITALS — BP 105/62 | HR 71 | Temp 97.6°F | Ht 65.0 in | Wt 189.0 lb

## 2021-08-16 DIAGNOSIS — R7303 Prediabetes: Secondary | ICD-10-CM | POA: Diagnosis not present

## 2021-08-16 DIAGNOSIS — E559 Vitamin D deficiency, unspecified: Secondary | ICD-10-CM | POA: Diagnosis not present

## 2021-08-16 DIAGNOSIS — E7849 Other hyperlipidemia: Secondary | ICD-10-CM | POA: Diagnosis not present

## 2021-08-16 DIAGNOSIS — E669 Obesity, unspecified: Secondary | ICD-10-CM | POA: Diagnosis not present

## 2021-08-16 DIAGNOSIS — Z6831 Body mass index (BMI) 31.0-31.9, adult: Secondary | ICD-10-CM

## 2021-08-16 MED ORDER — VITAMIN D (ERGOCALCIFEROL) 1.25 MG (50000 UNIT) PO CAPS: 50000.0000 [IU] | ORAL_CAPSULE | ORAL | 0 refills | Status: DC

## 2021-08-17 NOTE — Progress Notes (Unsigned)
Chief Complaint:   OBESITY Dana Mccoy is here to discuss her progress with her obesity treatment plan along with follow-up of her obesity related diagnoses. Dana Mccoy is on the Category 2 Plan and states she is following her eating plan approximately 97% of the time. Dana Mccoy states she is walking 7,000 steps  5 times per week.  Today's visit was #: 6 Starting weight: 203 lbs Starting date: 04/20/2021 Today's weight: 189 lbs Today's date: 08/16/2021 Total lbs lost to date: 14 lbs Total lbs lost since last in-office visit: 2 lbs  Interim History: Dana Mccoy has overall done well with weight loss. She has been to [place] since her last visit. She went to the gym yesterday for the first time in the past month. She is working on getting back on track with going to the gym more.   Subjective:   1. Vitamin D deficiency Dana Mccoy is taking Vitamin D 50,000 IU weekly. She denies nausea, vomiting, and muscle weakness. Her last Vitamin D was increased from 29.2 to 16.8. We discussed labs today.   2. Pre-diabetes Dana Mccoy's last A1C was 5.7. She hasn't been taking Metformin but, plans to start. We discussed labs today.   3. Other hyperlipidemia Dana Mccoy is currently taking Crestor 5 mg. She denies side effects. We discussed labs today.   Assessment/Plan:   1. Vitamin D deficiency Low Vitamin D level contributes to fatigue and are associated with obesity, breast, and colon cancer. Dana Mccoy would like to continue Vitamin D 50,000 IU every 2 weeks. We will refill prescription Vitamin D 50,000 IU every 2 weeks for 1 month with 1 refill and Dana Mccoy will follow-up for routine testing of Vitamin D, at least 2-3 times per year to avoid over-replacement. We discussed side effects.   - Vitamin D, Ergocalciferol, (DRISDOL) 1.25 MG (50000 UNIT) CAPS capsule; Take 1 capsule (50,000 Units total) by mouth every 14 (fourteen) days.  Dispense: 2 capsule; Refill: 0  2. Pre-diabetes Dana Mccoy agrees to start Metformin as directed. We discussed side  effects. She will continue to work on weight loss, exercise, and decreasing simple carbohydrates to help decrease the risk of diabetes.   3. Other hyperlipidemia Cardiovascular risk and specific lipid/LDL goals reviewed.  Dana Mccoy will continue to follow up with her primary care physician. She will continue medications as directed. We discussed several lifestyle modifications today and Dana Mccoy will continue to work on diet, exercise and weight loss efforts. Orders and follow up as documented in patient record.   Counseling Intensive lifestyle modifications are the first line treatment for this issue. Dietary changes: Increase soluble fiber. Decrease simple carbohydrates. Exercise changes: Moderate to vigorous-intensity aerobic activity 150 minutes per week if tolerated. Lipid-lowering medications: see documented in medical record.  4. Obesity with current BMI of 31.5 Dana Mccoy is currently in the action stage of change. As such, her goal is to continue with weight loss efforts. She has agreed to the Category 2 Plan.   Exercise goals:  As is.   Behavioral modification strategies: increasing lean protein intake, increasing water intake, and meal planning and cooking strategies.  Dana Mccoy has agreed to follow-up with our clinic in 2-3 weeks. She was informed of the importance of frequent follow-up visits to maximize her success with intensive lifestyle modifications for her multiple health conditions.   Objective:   Blood pressure 105/62, pulse 71, temperature 97.6 F (36.4 C), height '5\' 5"'$  (1.651 m), weight 189 lb (85.7 kg), SpO2 95 %. Body mass index is 31.45 kg/m.  General:  Cooperative, alert, well developed, in no acute distress. HEENT: Conjunctivae and lids unremarkable. Cardiovascular: Regular rhythm.  Lungs: Normal work of breathing. Neurologic: No focal deficits.   Lab Results  Component Value Date   CREATININE 0.79 07/22/2021   BUN 18 07/22/2021   NA 145 (H) 07/22/2021   K 3.8 07/22/2021    CL 104 07/22/2021   CO2 25 07/22/2021   Lab Results  Component Value Date   ALT 18 07/22/2021   AST 26 07/22/2021   ALKPHOS 115 07/22/2021   BILITOT 0.3 07/22/2021   Lab Results  Component Value Date   HGBA1C 5.7 (H) 07/19/2021   HGBA1C 5.7 (H) 04/20/2021   HGBA1C 5.5 09/17/2015   HGBA1C 5.7 01/24/2006   Lab Results  Component Value Date   INSULIN 11.4 04/20/2021   Lab Results  Component Value Date   TSH 4.270 04/20/2021   Lab Results  Component Value Date   CHOL 168 07/19/2021   HDL 52 07/19/2021   LDLCALC 100 (H) 07/19/2021   LDLDIRECT 150.0 09/06/2019   TRIG 83 07/19/2021   CHOLHDL 5 09/06/2019   Lab Results  Component Value Date   VD25OH 76.8 07/19/2021   VD25OH 29.2 (L) 04/20/2021   Lab Results  Component Value Date   WBC 6.4 04/20/2021   HGB 13.8 04/20/2021   HCT 42.4 04/20/2021   MCV 86 04/20/2021   PLT 278 04/20/2021   Lab Results  Component Value Date   IRON 62 09/06/2019   TIBC 401 09/06/2019   FERRITIN 16 09/06/2019   Attestation Statements:   Reviewed by clinician on day of visit: allergies, medications, problem list, medical history, surgical history, family history, social history, and previous encounter notes.  I, Lizbeth Bark, RMA, am acting as Location manager for [name], FNP.  I have reviewed the above documentation for accuracy and completeness, and I agree with the above. [name], FNP

## 2021-08-25 ENCOUNTER — Ambulatory Visit: Payer: Medicare Other

## 2021-08-25 DIAGNOSIS — J4540 Moderate persistent asthma, uncomplicated: Secondary | ICD-10-CM | POA: Diagnosis not present

## 2021-08-30 ENCOUNTER — Telehealth: Payer: Self-pay | Admitting: Internal Medicine

## 2021-08-30 MED ORDER — SPIRIVA RESPIMAT 1.25 MCG/ACT IN AERS: 2.0000 | INHALATION_SPRAY | Freq: Every day | RESPIRATORY_TRACT | 5 refills | Status: DC

## 2021-08-30 NOTE — Telephone Encounter (Signed)
Rx for pt's Spiriva has been sent to preferred pharmacy for pt. Attempted to call pt to let her know this had been done but unable to reach. Left pt a detailed message letting her know that the Rx was sent in. Nothing further needed.

## 2021-09-08 ENCOUNTER — Ambulatory Visit (INDEPENDENT_AMBULATORY_CARE_PROVIDER_SITE_OTHER): Payer: Medicare Other | Admitting: Nurse Practitioner

## 2021-09-08 ENCOUNTER — Ambulatory Visit (INDEPENDENT_AMBULATORY_CARE_PROVIDER_SITE_OTHER): Payer: Medicare Other | Admitting: Bariatrics

## 2021-09-08 ENCOUNTER — Ambulatory Visit (INDEPENDENT_AMBULATORY_CARE_PROVIDER_SITE_OTHER): Payer: Medicare Other | Admitting: Family Medicine

## 2021-09-08 ENCOUNTER — Encounter (INDEPENDENT_AMBULATORY_CARE_PROVIDER_SITE_OTHER): Payer: Self-pay | Admitting: Bariatrics

## 2021-09-08 VITALS — BP 122/72 | HR 53 | Temp 97.7°F | Ht 65.0 in | Wt 187.0 lb

## 2021-09-08 DIAGNOSIS — Z6831 Body mass index (BMI) 31.0-31.9, adult: Secondary | ICD-10-CM

## 2021-09-08 DIAGNOSIS — R7303 Prediabetes: Secondary | ICD-10-CM

## 2021-09-08 DIAGNOSIS — E559 Vitamin D deficiency, unspecified: Secondary | ICD-10-CM | POA: Diagnosis not present

## 2021-09-08 DIAGNOSIS — E669 Obesity, unspecified: Secondary | ICD-10-CM

## 2021-09-08 MED ORDER — VITAMIN D (ERGOCALCIFEROL) 1.25 MG (50000 UNIT) PO CAPS: 50000.0000 [IU] | ORAL_CAPSULE | ORAL | 0 refills | Status: DC

## 2021-09-10 ENCOUNTER — Ambulatory Visit: Payer: Medicare Other

## 2021-09-10 DIAGNOSIS — J4540 Moderate persistent asthma, uncomplicated: Secondary | ICD-10-CM

## 2021-09-13 ENCOUNTER — Encounter (INDEPENDENT_AMBULATORY_CARE_PROVIDER_SITE_OTHER): Payer: Self-pay | Admitting: Bariatrics

## 2021-09-13 NOTE — Progress Notes (Signed)
Chief Complaint:   OBESITY Dana Mccoy is here to discuss her progress with her obesity treatment plan along with follow-up of her obesity related diagnoses. Dana Mccoy is on the Category 2 Plan and states she is following her eating plan approximately 95% of the time. Dana Mccoy states she is walking 6,000-7,000 steps 7 times per week.    Today's visit was #: 7 Starting weight: 203 lbs Starting date: 04/20/2021 Today's weight: 187 lbs Today's date: 09/08/2021 Total lbs lost to date: 16 Total lbs lost since last in-office visit: 2  Interim History: Dana Mccoy is down an additional 2 pounds since her last visit.  She had been out of town.  She is sometimes low on protein.  Subjective:   1. Vitamin D deficiency Dana Mccoy is currently taking prescription vitamin D.  She notes her energy is better.  2. Pre-diabetes Dana Mccoy has not been taking metformin as directed.  Assessment/Plan:   1. Vitamin D deficiency We will refill prescription vitamin D 50,000 units once every 14 days for 1 month.  - Vitamin D, Ergocalciferol, (DRISDOL) 1.25 MG (50000 UNIT) CAPS capsule; Take 1 capsule (50,000 Units total) by mouth every 14 (fourteen) days.  Dispense: 2 capsule; Refill: 0  2. Pre-diabetes Dana Mccoy is to continue taking her metformin as directed.  3. Obesity with current BMI of 31.2 Dana Mccoy is currently in the action stage of change. As such, her goal is to continue with weight loss efforts. She has agreed to the Category 2 Plan.   Meal planning and intentional eating were discussed.  Dana Mccoy is to increase her protein.  Exercise goals: As is.  Behavioral modification strategies: increasing lean protein intake, decreasing simple carbohydrates, increasing vegetables, increasing water intake, decreasing eating out, no skipping meals, meal planning and cooking strategies, keeping healthy foods in the home, and planning for success.  Dana Mccoy has agreed to follow-up with our clinic in 3 weeks with [name], FNP-C. She  was informed of the importance of frequent follow-up visits to maximize her success with intensive lifestyle modifications for her multiple health conditions.   Objective:   Blood pressure 122/72, pulse (!) 53, temperature 97.7 F (36.5 C), height '5\' 5"'$  (1.651 m), weight 187 lb (84.8 kg), SpO2 95 %. Body mass index is 31.12 kg/m.  General: Cooperative, alert, well developed, in no acute distress. HEENT: Conjunctivae and lids unremarkable. Cardiovascular: Regular rhythm.  Lungs: Normal work of breathing. Neurologic: No focal deficits.   Lab Results  Component Value Date   CREATININE 0.79 07/22/2021   BUN 18 07/22/2021   NA 145 (H) 07/22/2021   K 3.8 07/22/2021   CL 104 07/22/2021   CO2 25 07/22/2021   Lab Results  Component Value Date   ALT 18 07/22/2021   AST 26 07/22/2021   ALKPHOS 115 07/22/2021   BILITOT 0.3 07/22/2021   Lab Results  Component Value Date   HGBA1C 5.7 (H) 07/19/2021   HGBA1C 5.7 (H) 04/20/2021   HGBA1C 5.5 09/17/2015   HGBA1C 5.7 01/24/2006   Lab Results  Component Value Date   INSULIN 11.4 04/20/2021   Lab Results  Component Value Date   TSH 4.270 04/20/2021   Lab Results  Component Value Date   CHOL 168 07/19/2021   HDL 52 07/19/2021   LDLCALC 100 (H) 07/19/2021   LDLDIRECT 150.0 09/06/2019   TRIG 83 07/19/2021   CHOLHDL 5 09/06/2019   Lab Results  Component Value Date   VD25OH 76.8 07/19/2021   VD25OH 29.2 (L) 04/20/2021  Lab Results  Component Value Date   WBC 6.4 04/20/2021   HGB 13.8 04/20/2021   HCT 42.4 04/20/2021   MCV 86 04/20/2021   PLT 278 04/20/2021   Lab Results  Component Value Date   IRON 62 09/06/2019   TIBC 401 09/06/2019   FERRITIN 16 09/06/2019   Attestation Statements:   Reviewed by clinician on day of visit: allergies, medications, problem list, medical history, surgical history, family history, social history, and previous encounter notes.   Wilhemena Durie, am acting as Location manager for  [employer], DO.  I have reviewed the above documentation for accuracy and completeness, and I agree with the above. Jearld Lesch, DO

## 2021-09-23 ENCOUNTER — Ambulatory Visit: Payer: Medicare Other

## 2021-09-23 DIAGNOSIS — J4540 Moderate persistent asthma, uncomplicated: Secondary | ICD-10-CM

## 2021-09-29 ENCOUNTER — Encounter (INDEPENDENT_AMBULATORY_CARE_PROVIDER_SITE_OTHER): Payer: Self-pay | Admitting: Nurse Practitioner

## 2021-09-29 ENCOUNTER — Ambulatory Visit (INDEPENDENT_AMBULATORY_CARE_PROVIDER_SITE_OTHER): Payer: Medicare Other | Admitting: Nurse Practitioner

## 2021-09-29 ENCOUNTER — Encounter: Payer: Self-pay | Admitting: Internal Medicine

## 2021-09-29 VITALS — BP 101/75 | HR 87 | Temp 97.5°F | Ht 65.0 in | Wt 186.0 lb

## 2021-09-29 DIAGNOSIS — I10 Essential (primary) hypertension: Secondary | ICD-10-CM | POA: Diagnosis not present

## 2021-09-29 DIAGNOSIS — E559 Vitamin D deficiency, unspecified: Secondary | ICD-10-CM | POA: Diagnosis not present

## 2021-09-29 DIAGNOSIS — Z6831 Body mass index (BMI) 31.0-31.9, adult: Secondary | ICD-10-CM | POA: Diagnosis not present

## 2021-09-29 DIAGNOSIS — Z87891 Personal history of nicotine dependence: Secondary | ICD-10-CM

## 2021-09-29 DIAGNOSIS — E669 Obesity, unspecified: Secondary | ICD-10-CM

## 2021-09-29 MED ORDER — VITAMIN D (ERGOCALCIFEROL) 1.25 MG (50000 UNIT) PO CAPS: 50000.0000 [IU] | ORAL_CAPSULE | ORAL | 0 refills | Status: DC

## 2021-09-30 NOTE — Telephone Encounter (Signed)
Please advise if the generic would be a cheaper option for pt. Thanks.

## 2021-09-30 NOTE — Progress Notes (Signed)
Chief Complaint:   OBESITY Dana Mccoy is here to discuss her progress with her obesity treatment plan along with follow-up of her obesity related diagnoses. Dana Mccoy is on the Category 2 Plan and states she is following her eating plan approximately 90% of the time. Dana Mccoy states she is walking 20-25 minutes 2 times per week.  Today's visit was #: 8 Starting weight: 203 lbs Starting date: 04/20/2021 Today's weight: 186 lbs Today's date: 09/29/2021 Total lbs lost to date: 17 lbs Total lbs lost since last in-office visit: 1  Interim History: Dana Mccoy has overall done well with weight loss. She is under more stress due to packing and preparing to move. She is not sleeping well. Her house is going on sale this weekend. She is moving to [place] in Sept to live with her daughter.  Subjective:   1. Vitamin D deficiency Dana Mccoy is currently taking prescription Vit D 50,000 IU once a week. Denies any nausea, vomiting or muscle weakness.  2. Essential hypertension Dana Mccoy is currently taking HCTZ 12.5 mg. Denies any side effects.Denies chest pain, palpitations and dizziness. ( Concerned blood pressure is too low today).  Assessment/Plan:   1. Vitamin D deficiency We will refill Vit D 50,000 IU once weekly for 1 month with 0 refills.  Low Vitamin D level contributes to fatigue and are associated with obesity, breast, and colon cancer. She agrees to continue to take prescription Vitamin D '@50'$ ,000 IU every week and will follow-up for routine testing of Vitamin D, at least 2-3 times per year to avoid over-replacement.   -Refill Vitamin D, Ergocalciferol, (DRISDOL) 1.25 MG (50000 UNIT) CAPS capsule; Take 1 capsule (50,000 Units total) by mouth every 14 (fourteen) days.  Dispense: 2 capsule; Refill: 0  2. Essential hypertension Dana Mccoy will take HCTZ 12.5 mg every other day. She has an appointment with pulmonary at the end of the month. If blood pressure is elevated restart back daily.  Dana Mccoy is working on healthy weight  loss and exercise to improve blood pressure control. We will watch for signs of hypotension as she continues her lifestyle modifications.   3. Obesity with current BMI of 31.1 Dana Mccoy is currently in the action stage of change. As such, her goal is to continue with weight loss efforts. She has agreed to the Category 2 Plan.   Exercise goals: As is.  Behavioral modification strategies: increasing lean protein intake, increasing water intake, and planning for success.  Dana Mccoy has agreed to follow-up with our clinic in 2 weeks. She was informed of the importance of frequent follow-up visits to maximize her success with intensive lifestyle modifications for her multiple health conditions.   Objective:   Blood pressure 101/75, pulse 87, temperature (!) 97.5 F (36.4 C), height '5\' 5"'$  (1.651 m), weight 186 lb (84.4 kg), SpO2 96 %. Body mass index is 30.95 kg/m.  General: Cooperative, alert, well developed, in no acute distress. HEENT: Conjunctivae and lids unremarkable. Cardiovascular: Regular rhythm.  Lungs: Normal work of breathing. Neurologic: No focal deficits.   Lab Results  Component Value Date   CREATININE 0.79 07/22/2021   BUN 18 07/22/2021   NA 145 (H) 07/22/2021   K 3.8 07/22/2021   CL 104 07/22/2021   CO2 25 07/22/2021   Lab Results  Component Value Date   ALT 18 07/22/2021   AST 26 07/22/2021   ALKPHOS 115 07/22/2021   BILITOT 0.3 07/22/2021   Lab Results  Component Value Date   HGBA1C 5.7 (H) 07/19/2021  HGBA1C 5.7 (H) 04/20/2021   HGBA1C 5.5 09/17/2015   HGBA1C 5.7 01/24/2006   Lab Results  Component Value Date   INSULIN 11.4 04/20/2021   Lab Results  Component Value Date   TSH 4.270 04/20/2021   Lab Results  Component Value Date   CHOL 168 07/19/2021   HDL 52 07/19/2021   LDLCALC 100 (H) 07/19/2021   LDLDIRECT 150.0 09/06/2019   TRIG 83 07/19/2021   CHOLHDL 5 09/06/2019   Lab Results  Component Value Date   VD25OH 76.8 07/19/2021   VD25OH 29.2  (L) 04/20/2021   Lab Results  Component Value Date   WBC 6.4 04/20/2021   HGB 13.8 04/20/2021   HCT 42.4 04/20/2021   MCV 86 04/20/2021   PLT 278 04/20/2021   Lab Results  Component Value Date   IRON 62 09/06/2019   TIBC 401 09/06/2019   FERRITIN 16 09/06/2019   Attestation Statements:   Reviewed by clinician on day of visit: allergies, medications, problem list, medical history, surgical history, family history, social history, and previous encounter notes.  I, Brendell Tyus, RMA, am acting as transcriptionist for [name], FNP.  I have reviewed the above documentation for accuracy and completeness, and I agree with the above. [name], FNP

## 2021-10-08 ENCOUNTER — Ambulatory Visit: Payer: Medicare Other

## 2021-10-08 DIAGNOSIS — J4540 Moderate persistent asthma, uncomplicated: Secondary | ICD-10-CM

## 2021-10-11 ENCOUNTER — Encounter: Payer: Self-pay | Admitting: Internal Medicine

## 2021-10-11 ENCOUNTER — Ambulatory Visit (INDEPENDENT_AMBULATORY_CARE_PROVIDER_SITE_OTHER): Payer: Medicare Other | Admitting: Internal Medicine

## 2021-10-11 VITALS — BP 134/76 | HR 69 | Temp 98.0°F | Ht 67.0 in | Wt 192.0 lb

## 2021-10-11 DIAGNOSIS — R768 Other specified abnormal immunological findings in serum: Secondary | ICD-10-CM | POA: Diagnosis not present

## 2021-10-11 DIAGNOSIS — J4550 Severe persistent asthma, uncomplicated: Secondary | ICD-10-CM | POA: Diagnosis not present

## 2021-10-11 NOTE — Patient Instructions (Addendum)
Follow up as needed with me.   Good luck with your move to [place]!   Let me know if you need any assistance until getting established with a new pulmonologist and allergist.   Continue xolair and spiriva.  continue advair HFA and flovent with spacer - let me know if your insurance has a preferred formulary alternative.  continue albuterol prn continue montelukast with claritin

## 2021-10-11 NOTE — Progress Notes (Unsigned)
Dana Mccoy    [id number]    14-Feb-1956  Primary Care Physician:Burns, Claudina Lick, MD Date of Appointment: 10/11/2021 Established Patient Visit  Chief complaint:   Chief Complaint  Patient presents with   Follow-up    No c/o      HPI: Rhilyn Battle Mccoy is a 66 y.o. woman with severe persistent asthma with elevated IgE and sensitivity to aeroallergens on xolair since early 2023.  Allergist - Dr. Ernst Bowler  Interval Updates: Here for follow up. She is overall feeling good.  She is moving to [place] in September to be with her daughter and kids. Will need to establish with new pulmonologist and allergist there.  No hospitalizations or ED visits  Feels better on spiriva and xolair.  No adverse reactions with xolair.   Current Regimen: Advair 230 HFA 2 puffs twice a day, flovent 110 2 puffs twice a day, albuterol Asthma Triggers: She does have environmental allergies in the fall - dust, mold, grass. URIs Exacerbations in the last year: multiple requiring prednisone in the last year History of hospitalization or intubation: never intubated Allergy Testing: yes food allergies tested multiple - all negative. Eats nuts fine. Had SPT done for environmental allergies many years ago.  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  Exposures: lives at home alone with dog.  Smoking history: former smoker 15 pack years, quit in 1987.   I have reviewed the patient's family social and past medical history and updated as appropriate.   Past Medical History:  Diagnosis Date   Anemia    Angio-edema    Anxiety    Asthma exacerbation, mild  02/12/2020   Back pain    Cervical stenosis of spine    Frequent headaches    GERD (gastroesophageal reflux disease)    Hyperlipidemia    Hypertension    IBS (irritable bowel syndrome)    Joint pain    Metabolic syndrome    Osteoarthritis    Other fatigue    Palpitations    Prediabetes    Reactive airway disease    post CAP   Shortness of breath    Shortness of breath on exertion    Swallowing difficulty     Past Surgical History:  Procedure Laterality Date   CARDIAC CATHETERIZATION  1997   negative   COLONOSCOPY  2007   Dr Cristina Gong   DILATION AND CURETTAGE OF UTERUS     UPPER GI ENDOSCOPY     X 2   WISDOM TOOTH EXTRACTION      Family History  Adopted: Yes  Problem Relation Age of Onset   Cancer Mother    Allergic rhinitis Mother    Asthma Neg Hx    Eczema Neg Hx    Urticaria Neg Hx     Social History   Occupational History   Occupation: Surveyor, quantity: [employer]    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  Tobacco Use   Smoking status: Former    Packs/day: 1.50    Years: 12.00    Total pack years: 18.00    Types: Cigarettes  Start date: 03/14/1973    Quit date: 03/14/1985    Years since quitting: 36.6   Smokeless tobacco: Never   Tobacco comments:    smoked 1975-1986, up to 1.5 ppd  Vaping Use   Vaping Use: Never used  Substance and Sexual Activity   Alcohol use: No    Comment: very rarely   Drug use: No   Sexual activity: Not Currently     Physical Exam: Blood pressure 134/76, pulse 69, temperature 98 F (36.7 C), temperature source Oral, height '5\' 7"'$  (1.702 m), weight 192 lb (87.1 kg), SpO2 94 %.  Gen:     Well appearing, no distress. Voice quality improved Lungs:    no wheezes or crackles, good air entry bilaterally CV:         RRR no mrg   Data Reviewed: Imaging: I have personally reviewed the chest xray 11/2020 no acute pulmonary process  PFTs:      No data to display         Feno is 11 ppb today.  Spirometry normal  10/27/20  Labs: IgE 144 in Nov 2022  Immunization status: Immunization History  Administered Date(s) Administered   Influenza, High Dose Seasonal PF 12/10/2020   Influenza,inj,Quad PF,6+ Mos 11/16/2016   Influenza-Unspecified 12/12/2017   Pfizer Covid-19 Vaccine Bivalent Booster 58yr & up 12/24/2020   Pneumococcal Polysaccharide-23 07/31/2012   Tdap 07/22/2021    External Records Personally Reviewed: pulmonary, allergy,   Assessment:  Severe persistent asthma with improved control on biologic therapy.  Allergic rhinitis with aeroallergen sensitivity. Elevated IGE   Plan/Recommendations: Continue xolair and spiriva.  continue advair HFA and flovent with spacer - let me know if your insurance has a preferred formulary alternative.  continue albuterol prn continue montelukast with claritin   Return to Care: As needed. I am available to her if she needs refills prior to establishing care with new pulmonologist.    NLenice Llamas MD [facility]

## 2021-10-14 ENCOUNTER — Encounter (INDEPENDENT_AMBULATORY_CARE_PROVIDER_SITE_OTHER): Payer: Self-pay | Admitting: Nurse Practitioner

## 2021-10-14 ENCOUNTER — Ambulatory Visit (INDEPENDENT_AMBULATORY_CARE_PROVIDER_SITE_OTHER): Payer: Medicare Other | Admitting: Nurse Practitioner

## 2021-10-14 VITALS — BP 100/68 | HR 81 | Temp 98.4°F | Ht 67.0 in | Wt 186.0 lb

## 2021-10-14 DIAGNOSIS — E669 Obesity, unspecified: Secondary | ICD-10-CM

## 2021-10-14 DIAGNOSIS — Z6831 Body mass index (BMI) 31.0-31.9, adult: Secondary | ICD-10-CM | POA: Diagnosis not present

## 2021-10-14 DIAGNOSIS — I10 Essential (primary) hypertension: Secondary | ICD-10-CM | POA: Diagnosis not present

## 2021-10-14 DIAGNOSIS — R7303 Prediabetes: Secondary | ICD-10-CM | POA: Diagnosis not present

## 2021-10-19 NOTE — Progress Notes (Signed)
Chief Complaint:   OBESITY Dana Mccoy is here to discuss her progress with her obesity treatment plan along with follow-up of her obesity related diagnoses. Dana Mccoy is on the Category 2 Plan and states she is following her eating plan approximately 95% of the time. Dana Mccoy states she is walking 20 minutes 3 times per week.  Today's visit was #: 9 Starting weight: 203 lbs Starting date: 04/20/2021 Today's weight: 186 lbs Today's date: 10/14/2021 Total lbs lost to date: 17 lbs Total lbs lost since last in-office visit: 0  Interim History: Dana Mccoy has maintained her weight since her last visit. She has been eating more prepared foods due to moving. She was worried that she was going to gain weight due to increased sodium intake. Working on Designer, fashion/clothing intake. She has gotten somewhat off track due to packing but feels that she is now able to get back on track.  Subjective:   1. Pre-diabetes Dana Mccoy took Metformin 500 mg for one day. She felt bad all day and decided not to continue taking it. Last A1c was 5.7. Family history: unknown.  2. Essential hypertension Dana Mccoy is currently taking HCTZ 12.5 mg daily. Denies any side effects. Since her last visit she has tried taking HCTZ every other day for 2 days and noted some swelling in her hands. She is taking it daily now. Denies LEE. Denies any chest pain,shortness of breath or palpitations or dizziness.  Assessment/Plan:   1. Pre-diabetes Dana Mccoy will continue to work on weight loss, exercise, and decreasing simple carbohydrates to help decrease the risk of diabetes.   2. Essential hypertension Dana Mccoy is working on healthy weight loss and exercise to improve blood pressure control. We will watch for signs of hypotension as she continues her lifestyle modifications.  3. Obesity with current BMI of 31.0 Dana Mccoy is currently in the action stage of change. As such, her goal is to continue with weight loss efforts. She has agreed to the Category 2 Plan.    Exercise goals: As is.  Behavioral modification strategies: increasing lean protein intake, increasing vegetables, and increasing water intake.  Dana Mccoy has agreed to follow-up with our clinic in 3 weeks. She was informed of the importance of frequent follow-up visits to maximize her success with intensive lifestyle modifications for her multiple health conditions.   Objective:   Blood pressure 100/68, pulse 81, temperature 98.4 F (36.9 C), height '5\' 7"'$  (1.702 m), weight 186 lb (84.4 kg), SpO2 99 %. Body mass index is 29.13 kg/m.  General: Cooperative, alert, well developed, in no acute distress. HEENT: Conjunctivae and lids unremarkable. Cardiovascular: Regular rhythm.  Lungs: Normal work of breathing. Neurologic: No focal deficits.   Lab Results  Component Value Date   CREATININE 0.79 07/22/2021   BUN 18 07/22/2021   NA 145 (H) 07/22/2021   K 3.8 07/22/2021   CL 104 07/22/2021   CO2 25 07/22/2021   Lab Results  Component Value Date   ALT 18 07/22/2021   AST 26 07/22/2021   ALKPHOS 115 07/22/2021   BILITOT 0.3 07/22/2021   Lab Results  Component Value Date   HGBA1C 5.7 (H) 07/19/2021   HGBA1C 5.7 (H) 04/20/2021   HGBA1C 5.5 09/17/2015   HGBA1C 5.7 01/24/2006   Lab Results  Component Value Date   INSULIN 11.4 04/20/2021   Lab Results  Component Value Date   TSH 4.270 04/20/2021   Lab Results  Component Value Date   CHOL 168 07/19/2021   HDL 52 07/19/2021  LDLCALC 100 (H) 07/19/2021   LDLDIRECT 150.0 09/06/2019   TRIG 83 07/19/2021   CHOLHDL 5 09/06/2019   Lab Results  Component Value Date   VD25OH 76.8 07/19/2021   VD25OH 29.2 (L) 04/20/2021   Lab Results  Component Value Date   WBC 6.4 04/20/2021   HGB 13.8 04/20/2021   HCT 42.4 04/20/2021   MCV 86 04/20/2021   PLT 278 04/20/2021   Lab Results  Component Value Date   IRON 62 09/06/2019   TIBC 401 09/06/2019   FERRITIN 16 09/06/2019   Attestation Statements:   Reviewed by clinician on  day of visit: allergies, medications, problem list, medical history, surgical history, family history, social history, and previous encounter notes.  I, Brendell Tyus, RMA, am acting as transcriptionist for [name], FNP.  I have reviewed the above documentation for accuracy and completeness, and I agree with the above. [name], FNP

## 2021-10-20 ENCOUNTER — Encounter (INDEPENDENT_AMBULATORY_CARE_PROVIDER_SITE_OTHER): Payer: Self-pay

## 2021-10-22 ENCOUNTER — Ambulatory Visit: Payer: Medicare Other

## 2021-10-22 DIAGNOSIS — J4540 Moderate persistent asthma, uncomplicated: Secondary | ICD-10-CM

## 2021-10-27 ENCOUNTER — Other Ambulatory Visit (INDEPENDENT_AMBULATORY_CARE_PROVIDER_SITE_OTHER): Payer: Self-pay | Admitting: Nurse Practitioner

## 2021-10-27 DIAGNOSIS — E559 Vitamin D deficiency, unspecified: Secondary | ICD-10-CM

## 2021-10-28 ENCOUNTER — Encounter: Payer: Self-pay | Admitting: Allergy & Immunology

## 2021-10-28 ENCOUNTER — Ambulatory Visit: Payer: Medicare Other | Admitting: Allergy & Immunology

## 2021-10-28 VITALS — BP 110/60 | HR 61 | Temp 97.6°F | Resp 16 | Wt 190.2 lb

## 2021-10-28 DIAGNOSIS — J4550 Severe persistent asthma, uncomplicated: Secondary | ICD-10-CM

## 2021-10-28 DIAGNOSIS — J4540 Moderate persistent asthma, uncomplicated: Secondary | ICD-10-CM | POA: Diagnosis not present

## 2021-10-28 DIAGNOSIS — K219 Gastro-esophageal reflux disease without esophagitis: Secondary | ICD-10-CM | POA: Diagnosis not present

## 2021-10-28 DIAGNOSIS — T783XXD Angioneurotic edema, subsequent encounter: Secondary | ICD-10-CM

## 2021-10-28 DIAGNOSIS — T7840XD Allergy, unspecified, subsequent encounter: Secondary | ICD-10-CM

## 2021-10-28 NOTE — Progress Notes (Signed)
FOLLOW UP  Date of Service/Encounter:  10/28/21   Assessment:   Angioedema - doing well with the addition of Xolair to help with both angioedema and asthma (IgE level of 144 in November 2022)   Allergic rhinitis (trees, ragweed)   Moderate persistent asthma, uncomplicated - managed by Pulmonology   GERD  Plan/Recommendations:   1. Moderate persistent reactive airway disease without complication - Lung testing looks phenomenal.  - We are so sad to see you leave!  - Daily controller medication(s): Advair 230/5mg two puffs twice daily with spacer + Spiriva two puffs once daily + Xolair 150 mg every two weeks - Prior to physical activity: albuterol 2 puffs 10-15 minutes before physical activity. - Rescue medications: albuterol 4 puffs every 4-6 hours as needed - Asthma control goals:  * Full participation in all desired activities (may need albuterol before activity) * Albuterol use two time or less a week on average (not counting use with activity) * Cough interfering with sleep two time or less a month * Oral steroids no more than once a year * No hospitalizations  2. Gastroesophageal reflux disease - Continue with pantoprazole '40mg'$  twice daily. - She has been on and off other reflux medications in the past, including H2 blockers  3. Allergic reaction - unknown trigger - EpiPen is up to date.  - Continue with Claritin '10mg'$  twice daily as you are doing.  4. We will miss you!!   Subjective:   Dana GERMERis a 66y.o. female presenting today for follow up of  Chief Complaint  Patient presents with   Follow-up    Still having some problems but they are much better since she started the Xolair shot.     Dana MCNEWhas a history of the following: Patient Active Problem List   Diagnosis Date Noted   URI (upper respiratory infection) 05/26/2021   Prediabetes 04/20/2021   Essential hypertension 04/20/2021   Other irritable bowel syndrome 04/20/2021    Acute cystitis 08/17/2020   Asthma exacerbation, mild 02/12/2020   Adverse food reaction 01/30/2020   Angio-edema 01/17/2020   Frequent headaches 01/17/2020   DDD (degenerative disc disease), cervical 09/24/2019   Numbness and tingling 09/06/2019   Low iron 09/06/2019   Tremor 09/06/2019   Restless leg syndrome 07/27/2018   Chronic pain of left knee 04/17/2018   Right low back pain 04/17/2018   Bradycardia 08/21/2017   Anxiety 12/08/2013   Obesity (BMI 30-39.9) 08/02/2013   Other allergic rhinitis 08/02/2013   Cervical stenosis of spine 04/26/2011   Hyperlipidemia 06/05/2007   Reactive airway disease 06/05/2007   Gastroesophageal reflux disease 06/05/2007    History obtained from: chart review and patient.  LLeonetteis a 67y.o. female presenting for a follow up visit.  She was last seen in February 2023.  At that time, we did not do lung testing.  Her asthma was controlled with Advair 230 mcg 2 puffs twice daily.  She continues to follow with Dr. DShearon Stalls  We decided to add Xolair to help with both her breathing as well as her idiopathic anaphylaxis.  For her GERD, we continue with pantoprazole 40 mg twice daily.  In the interim, she did start Xolair.  She has been getting it every two weeks since April 2023.  Since last visit, she has done well. [place] She has an appointment with a Pulmonologist on October 2nd. She is moving up there on  September 23rd. She is going to be living with her daughter and her family.   Asthma/Respiratory Symptom History: She is doing better to improve. June was particularly good and July and August are typically worse because of the humidity.  She saw Dr. Shearon Stalls in July 2023 and she was happy with how everything is going for her. She remains on the Spiriva two puffs once daily.   Allergic Rhinitis Symptom History: She is using her Claritin daily. She does have some dry mouth from this.  Food Allergy Symptom History: She is  overall doing well.  She had one mini episode in March or April or May with tongue swelling. But this is it since the Xolair started. She took one prednisone to see if this would help. She was better within 12-24 hours. We have never figured out what triggers these symptoms.  She has talked to her dentist about her mouth and swelling. She tells me that her mouth is constantly sore and does not feel normal. She is brushing and washing her mouth out after using it. She is using baking soda and warm water. She has some stuff that she sprays on her tongue.   GERD Symptom History: She has hoarseness that varies throughout the day. She remains on Protonix BID. She was started on that over one year ago but she has been on all of these GERD medications for 30+ years.   Otherwise, there have been no changes to her past medical history, surgical history, family history, or social history.    Review of Systems  Constitutional: Negative.  Negative for chills, fever, malaise/fatigue and weight loss.  HENT: Negative.  Negative for congestion, ear discharge and ear pain.   Eyes:  Negative for pain, discharge and redness.  Respiratory:  Negative for cough, sputum production, shortness of breath and wheezing.   Cardiovascular: Negative.  Negative for chest pain and palpitations.  Gastrointestinal:  Negative for abdominal pain, blood in stool, constipation, diarrhea, heartburn, nausea and vomiting.  Skin: Negative.  Negative for itching and rash.  Neurological:  Negative for dizziness and headaches.  Endo/Heme/Allergies:  Negative for environmental allergies. Does not bruise/bleed easily.       Objective:   Blood pressure 110/60, pulse 61, temperature 97.6 F (36.4 C), temperature source Temporal, resp. rate 16, weight 190 lb 3.2 oz (86.3 kg), SpO2 94 %. Body mass index is 29.79 kg/m.    Physical Exam Vitals reviewed.  Constitutional:      Appearance: She is well-developed.     Comments: Talkative  female.  Cooperative with the exam. Lovely.  HENT:     Head: Normocephalic and atraumatic.     Right Ear: Tympanic membrane, ear canal and external ear normal.     Left Ear: Tympanic membrane, ear canal and external ear normal.     Nose: No nasal deformity, septal deviation, mucosal edema or rhinorrhea.     Right Turbinates: Enlarged, swollen and pale.     Left Turbinates: Enlarged, swollen and pale.     Right Sinus: No maxillary sinus tenderness or frontal sinus tenderness.     Left Sinus: No maxillary sinus tenderness or frontal sinus tenderness.     Comments: No nasal polyps noted.     Mouth/Throat:     Mouth: Mucous membranes are not pale and not dry.     Pharynx: Uvula midline.  Eyes:     General: Lids are normal. No allergic shiner.       Right eye: No discharge.  Left eye: No discharge.     Conjunctiva/sclera: Conjunctivae normal.     Right eye: Right conjunctiva is not injected. No chemosis.    Left eye: Left conjunctiva is not injected. No chemosis.    Pupils: Pupils are equal, round, and reactive to light.  Cardiovascular:     Rate and Rhythm: Normal rate and regular rhythm.     Heart sounds: Normal heart sounds.  Pulmonary:     Effort: Pulmonary effort is normal. No tachypnea, accessory muscle usage or respiratory distress.     Breath sounds: Normal breath sounds. No wheezing, rhonchi or rales.  Chest:     Chest wall: No tenderness.  Lymphadenopathy:     Cervical: No cervical adenopathy.  Skin:    General: Skin is warm.     Capillary Refill: Capillary refill takes less than 2 seconds.     Coloration: Skin is not pale.     Findings: No abrasion, erythema, petechiae or rash. Rash is not papular, urticarial or vesicular.     Comments: No eczematous or urticarial lesions noted. No angioedema.   Neurological:     Mental Status: She is alert.  Psychiatric:        Behavior: Behavior is cooperative.      Diagnostic studies:    Spirometry: results normal (FEV1:  2.41/101%, FVC: 3.01/98%, FEV1/FVC: 80%).    Spirometry consistent with normal pattern.   Allergy Studies: none        Salvatore Marvel, MD  [facility]

## 2021-10-28 NOTE — Patient Instructions (Addendum)
1. Moderate persistent reactive airway disease without complication - Lung testing looks phenomenal.  - We are so sad to see you leave!  - Daily controller medication(s): Advair 230/26mg two puffs twice daily with spacer + Spiriva two puffs once daily + Xolair 150 mg every two weeks - Prior to physical activity: albuterol 2 puffs 10-15 minutes before physical activity. - Rescue medications: albuterol 4 puffs every 4-6 hours as needed - Asthma control goals:  * Full participation in all desired activities (may need albuterol before activity) * Albuterol use two time or less a week on average (not counting use with activity) * Cough interfering with sleep two time or less a month * Oral steroids no more than once a year * No hospitalizations  2. Gastroesophageal reflux disease - Continue with pantoprazole '40mg'$  twice daily.  3. Allergic reaction - unknown trigger - EpiPen is up to date.  - Continue with Claritin '10mg'$  twice daily as you are doing.  4. We will miss you!!    Please inform [place] any Emergency Department visits, hospitalizations, or changes in symptoms. Call [place]efore going to the ED for breathing or allergy symptoms since we might be able to fit you in for a sick visit. Feel free to contact u[place]anytime with any questions, problems, or concerns.  It was a pleasure to see you again today! Enjoy your trip!  Websites that have reliable patient information: 1. American Academy of Asthma, Allergy, and Immunology: www.aaaai.org 2. Food Allergy Research and Education (FARE): foodallergy.org 3. Mothers of Asthmatics: http://www.asthmacommunitynetwork.org 4. American College of Allergy, Asthma, and Immunology: www.acaai.org   COVID-19 Vaccine Information can be found at: [email or web address] questions related to vaccine distribution or appointments, please email [email or web address] or call [phone]   We realize  that you might be concerned about having an allergic reaction to the COVID19 vaccines. To help with that concern, WE ARE OFFERING THE COVID19 VACCINES IN OUR OFFICE! Ask the front desk for dates!     "Like" [place] Facebook and Instagram for our latest updates!      A healthy democracy works best when ANew [employer] Life Insuranceparticipate! Make sure you are registered to vote! If you have moved or changed any of your contact information, you will need to get this updated before voting!  In some cases, you MAY be able to register to vote online: [email or web address]

## 2021-10-29 ENCOUNTER — Encounter: Payer: Self-pay | Admitting: Allergy & Immunology

## 2021-10-29 MED ORDER — PANTOPRAZOLE SODIUM 40 MG PO TBEC: 40.0000 mg | DELAYED_RELEASE_TABLET | Freq: Two times a day (BID) | ORAL | 0 refills | Status: DC

## 2021-10-29 MED ORDER — SPIRIVA RESPIMAT 1.25 MCG/ACT IN AERS: 2.0000 | INHALATION_SPRAY | Freq: Every day | RESPIRATORY_TRACT | 0 refills | Status: DC

## 2021-10-29 MED ORDER — ALBUTEROL SULFATE HFA 108 (90 BASE) MCG/ACT IN AERS
INHALATION_SPRAY | RESPIRATORY_TRACT | 0 refills | Status: DC
Start: 2021-10-29 — End: 2023-10-03

## 2021-10-29 MED ORDER — FLUTICASONE-SALMETEROL 230-21 MCG/ACT IN AERO: 2.0000 | INHALATION_SPRAY | Freq: Two times a day (BID) | RESPIRATORY_TRACT | 0 refills | Status: DC

## 2021-11-04 ENCOUNTER — Ambulatory Visit (INDEPENDENT_AMBULATORY_CARE_PROVIDER_SITE_OTHER): Payer: Medicare Other | Admitting: Nurse Practitioner

## 2021-11-04 ENCOUNTER — Encounter (INDEPENDENT_AMBULATORY_CARE_PROVIDER_SITE_OTHER): Payer: Self-pay | Admitting: Nurse Practitioner

## 2021-11-04 VITALS — BP 102/68 | HR 74 | Temp 98.2°F | Ht 65.0 in | Wt 188.0 lb

## 2021-11-04 DIAGNOSIS — E669 Obesity, unspecified: Secondary | ICD-10-CM | POA: Diagnosis not present

## 2021-11-04 DIAGNOSIS — E559 Vitamin D deficiency, unspecified: Secondary | ICD-10-CM

## 2021-11-04 DIAGNOSIS — Z6831 Body mass index (BMI) 31.0-31.9, adult: Secondary | ICD-10-CM

## 2021-11-04 MED ORDER — VITAMIN D (ERGOCALCIFEROL) 1.25 MG (50000 UNIT) PO CAPS: 50000.0000 [IU] | ORAL_CAPSULE | ORAL | 0 refills | Status: DC

## 2021-11-05 ENCOUNTER — Ambulatory Visit: Payer: Medicare Other | Admitting: *Deleted

## 2021-11-05 DIAGNOSIS — J4550 Severe persistent asthma, uncomplicated: Secondary | ICD-10-CM

## 2021-11-09 ENCOUNTER — Encounter: Payer: Self-pay | Admitting: Internal Medicine

## 2021-11-09 NOTE — Progress Notes (Unsigned)
Subjective:    Patient ID: Dana Mccoy, female    DOB: 08-21-1955, 66 y.o.   MRN: [id number]      HPI Dana Mccoy is here for No chief complaint on file.    Tongue sore in the back of mouth -    Iron, B12, folate,  ivrual, sjrogens  Medications and allergies reviewed with patient and updated if appropriate.  Current Outpatient Medications on File Prior to Visit  Medication Sig Dispense Refill   acetaminophen (TYLENOL) 500 MG tablet Take 500 mg by mouth as needed.     albuterol (VENTOLIN HFA) 108 (90 Base) MCG/ACT inhaler INHALE 1-2 PUFFS INTO THE LUNGS EVERY 4 HOURS AS NEEDED FOR WHEEZING OR SHORTNESS OF BREATH 54 g 0   Calcium Carbonate-Vit D-Min (CALCIUM 600+D PLUS MINERALS) 600-400 MG-UNIT CHEW Chew 1 each by mouth daily.     FLOVENT HFA 110 MCG/ACT inhaler 2 puffs twice a day with spacer. 12 g 5   FLUoxetine (PROZAC) 20 MG capsule TAKE 1 CAPSULE BY MOUTH EVERY DAY 90 capsule 1   fluticasone (VERAMYST) 27.5 MCG/SPRAY nasal spray Place 2 sprays into the nose daily.     fluticasone-salmeterol (ADVAIR HFA) 230-21 MCG/ACT inhaler Inhale 2 puffs into the lungs 2 (two) times daily. 36 g 0   hydrochlorothiazide (MICROZIDE) 12.5 MG capsule TAKE 1 CAPSULE BY MOUTH ONCE DAILY 90 capsule 2   LACTASE ENZYME PO Take 1 each by mouth as needed.     loratadine (CLARITIN) 10 MG tablet Take 1 tablet (10 mg total) by mouth 2 (two) times daily. 60 tablet 5   mometasone (ELOCON) 0.1 % cream APPLY A THIN LAYER TO THE AFFECTED AREA OF THE SKIN DAILY 45 g 0   montelukast (SINGULAIR) 10 MG tablet Take 1 tablet (10 mg total) by mouth at bedtime. 30 tablet 11   Multiple Vitamin (MULTIVITAMIN) tablet Take 1 tablet by mouth daily.     pantoprazole (PROTONIX) 40 MG tablet Take 1 tablet (40 mg total) by mouth 2 (two) times daily. 180 tablet 0   rosuvastatin (CRESTOR) 5 MG tablet Take 1 tablet (5 mg total) by mouth daily. 90 tablet 3   Tiotropium Bromide Monohydrate (SPIRIVA RESPIMAT) 1.25 MCG/ACT AERS  Inhale 2 puffs into the lungs daily. 12 g 0   valACYclovir (VALTREX) 500 MG tablet Take 1 tablet (500 mg total) by mouth 2 (two) times daily. 180 tablet 3   vitamin C (ASCORBIC ACID) 500 MG tablet Take 500 mg by mouth daily.     Vitamin D, Ergocalciferol, (DRISDOL) 1.25 MG (50000 UNIT) CAPS capsule Take 1 capsule (50,000 Units total) by mouth every 14 (fourteen) days. 2 capsule 0   [DISCONTINUED] omeprazole (PRILOSEC) 40 MG capsule TAKE 1 CAPSULE BY MOUTH ONCE DAILY 90 capsule 0   Current Facility-Administered Medications on File Prior to Visit  Medication Dose Route Frequency Provider Last Rate Last Admin   omalizumab Arvid Right) prefilled syringe 225 mg  225 mg Subcutaneous Q14 Days Valentina Shaggy, MD   225 mg at 11/05/21 1444    Review of Systems     Objective:  There were no vitals filed for this visit. BP Readings from Last 3 Encounters:  11/04/21 102/68  10/28/21 110/60  10/14/21 100/68   Wt Readings from Last 3 Encounters:  11/04/21 188 lb (85.3 kg)  10/28/21 190 lb 3.2 oz (86.3 kg)  10/14/21 186 lb (84.4 kg)   There is no height or weight on file to calculate BMI.  Physical Exam         Assessment & Plan:    See Problem List for Assessment and Plan of chronic medical problems.

## 2021-11-10 ENCOUNTER — Encounter: Payer: Self-pay | Admitting: Internal Medicine

## 2021-11-10 ENCOUNTER — Ambulatory Visit (INDEPENDENT_AMBULATORY_CARE_PROVIDER_SITE_OTHER): Payer: Medicare Other | Admitting: Internal Medicine

## 2021-11-10 DIAGNOSIS — K146 Glossodynia: Secondary | ICD-10-CM | POA: Diagnosis not present

## 2021-11-10 DIAGNOSIS — I10 Essential (primary) hypertension: Secondary | ICD-10-CM

## 2021-11-10 DIAGNOSIS — E782 Mixed hyperlipidemia: Secondary | ICD-10-CM

## 2021-11-10 MED ORDER — NYSTATIN 100000 UNIT/ML MT SUSP: 5.0000 mL | Freq: Four times a day (QID) | OROMUCOSAL | 0 refills | Status: DC

## 2021-11-10 NOTE — Assessment & Plan Note (Signed)
Chronic Lipids have been well controlled Continue Crestor 5 mg daily

## 2021-11-10 NOTE — Patient Instructions (Addendum)
     Medications changes include :   nystatin swish and spit    Your prescription(s) have been sent to your pharmacy.     Return if symptoms worsen or fail to improve.

## 2021-11-10 NOTE — Assessment & Plan Note (Addendum)
Acute She states soreness of the tongue has been going on for several months-sounds like a generalized soreness, but one particular area is very sore-left posterior tongue.  Occasion she does have some visible sores or ulcers.  She denies any white coating of the tongue. Her taste has changed History of thrush last fall, which she feels was successfully treated Uses inhalers daily-rinses her mouth out religiously The area of most concern is far enough back that its not able to be visualized, but otherwise no white coating or concerning We will treat for possible thrush-if this helps she may need to treat intermittently for thrush because of her chronic use of inhalers She does take a multivitamin regularly and eats a pretty well-balanced diet so vitamin deficiency seems less likely Advised her if this does not help I would recommend seeing an oral surgeon or dentist who can look in the area to make sure there is nothing concerning

## 2021-11-10 NOTE — Assessment & Plan Note (Signed)
Chronic Blood pressure well controlled Continue hydrochlorothiazide 12.5 mg daily

## 2021-11-11 ENCOUNTER — Other Ambulatory Visit: Payer: Self-pay | Admitting: *Deleted

## 2021-11-11 NOTE — Progress Notes (Signed)
Chief Complaint:   OBESITY Dana Mccoy is here to discuss her progress with her obesity treatment plan along with follow-up of her obesity related diagnoses. Dana Mccoy is on the Category 2 Plan and states she is following her eating plan approximately 92% of the time. Dana Mccoy states she is walking and biking 25-30 minutes 2 times per week.  Today's visit was #: 10 Starting weight: 203 lbs Starting date: 04/20/2021 Today's weight: 188 lbs Today's date: 11/04/2021 Total lbs lost to date: 15 lbs Total lbs lost since last in-office visit: +2 lbs  Interim History: Today is Dana Mccoy's last visit. She is moving to [place].  She has been under stress with moving.  Struggling with sleeping.  Eating during the night (cereal).  Not eating a lot of vegetables.  Some stress eating.  Eating protein and fruit.  Acidic foods bother her.  Trying to drink more water.  Rarely drinks a sugary drink.    Subjective:   1. Vitamin D deficiency She is currently taking prescription vitamin D 50,000 IU each week. She denies nausea, vomiting or muscle weakness.  Assessment/Plan:   1. Vitamin D deficiency Low Vitamin D level contributes to fatigue and are associated with obesity, breast, and colon cancer. She agrees to continue to take prescription Vitamin D '@50'$ ,000 IU every week and will follow-up for routine testing of Vitamin D, at least 2-3 times per year to avoid over-replacement.  Refill - Vitamin D, Ergocalciferol, (DRISDOL) 1.25 MG (50000 UNIT) CAPS capsule; Take 1 capsule (50,000 Units total) by mouth every 14 (fourteen) days.  Dispense: 2 capsule; Refill: 0  2. Obesity with current BMI of 31.4 Dana Mccoy is currently in the action stage of change. As such, her goal is to continue with weight loss efforts. She has agreed to the Category 2 Plan.   Exercise goals:  As is.   Behavioral modification strategies: increasing lean protein intake, increasing water intake, meal planning and cooking strategies, and travel eating  strategies.  Dana Mccoy has agreed to follow-up with our clinic in as needed. She was informed of the importance of frequent follow-up visits to maximize her success with intensive lifestyle modifications for her multiple health conditions.   Objective:   Blood pressure 102/68, pulse 74, temperature 98.2 F (36.8 C), height '5\' 5"'$  (1.651 m), weight 188 lb (85.3 kg), SpO2 94 %. Body mass index is 31.28 kg/m.  General: Cooperative, alert, well developed, in no acute distress. HEENT: Conjunctivae and lids unremarkable. Cardiovascular: Regular rhythm.  Lungs: Normal work of breathing. Neurologic: No focal deficits.   Lab Results  Component Value Date   CREATININE 0.79 07/22/2021   BUN 18 07/22/2021   NA 145 (H) 07/22/2021   K 3.8 07/22/2021   CL 104 07/22/2021   CO2 25 07/22/2021   Lab Results  Component Value Date   ALT 18 07/22/2021   AST 26 07/22/2021   ALKPHOS 115 07/22/2021   BILITOT 0.3 07/22/2021   Lab Results  Component Value Date   HGBA1C 5.7 (H) 07/19/2021   HGBA1C 5.7 (H) 04/20/2021   HGBA1C 5.5 09/17/2015   HGBA1C 5.7 01/24/2006   Lab Results  Component Value Date   INSULIN 11.4 04/20/2021   Lab Results  Component Value Date   TSH 4.270 04/20/2021   Lab Results  Component Value Date   CHOL 168 07/19/2021   HDL 52 07/19/2021   LDLCALC 100 (H) 07/19/2021   LDLDIRECT 150.0 09/06/2019   TRIG 83 07/19/2021   CHOLHDL 5 09/06/2019  Lab Results  Component Value Date   VD25OH 76.8 07/19/2021   VD25OH 29.2 (L) 04/20/2021   Lab Results  Component Value Date   WBC 6.4 04/20/2021   HGB 13.8 04/20/2021   HCT 42.4 04/20/2021   MCV 86 04/20/2021   PLT 278 04/20/2021   Lab Results  Component Value Date   IRON 62 09/06/2019   TIBC 401 09/06/2019   FERRITIN 16 09/06/2019    Obesity Behavioral Intervention:   Approximately 15 minutes were spent on the discussion below.  ASK: We discussed the diagnosis of obesity with Dana Mccoy today and Dana Mccoy agreed to give  [name] permission to discuss obesity behavioral modification therapy today.  ASSESS: Dana Mccoy has the diagnosis of obesity and her BMI today is 31.4. Dana Mccoy is in the action stage of change.   ADVISE: Afsheen was educated on the multiple health risks of obesity as well as the benefit of weight loss to improve her health. She was advised of the need for long term treatment and the importance of lifestyle modifications to improve her current health and to decrease her risk of future health problems.  AGREE: Multiple dietary modification options and treatment options were discussed and Dana Mccoy agreed to follow the recommendations documented in the above note.  ARRANGE: Nyah was educated on the importance of frequent visits to treat obesity as outlined per CMS and USPSTF guidelines and agreed to schedule her next follow up appointment today.  Attestation Statements:   Reviewed by clinician on day of visit: allergies, medications, problem list, medical history, surgical history, family history, social history, and previous encounter notes.  I, Dana Mccoy, RMA, am acting as transcriptionist for [name], FNP  I have reviewed the above documentation for accuracy and completeness, and I agree with the above. [name], FNP

## 2021-11-15 ENCOUNTER — Encounter: Payer: Self-pay | Admitting: Allergy & Immunology

## 2021-11-16 ENCOUNTER — Other Ambulatory Visit: Payer: Self-pay

## 2021-11-16 MED ORDER — EPINEPHRINE 0.3 MG/0.3ML IJ SOAJ: 0.3000 mg | Freq: Once | INTRAMUSCULAR | 1 refills | Status: AC

## 2021-11-24 ENCOUNTER — Ambulatory Visit: Payer: Medicare Other

## 2021-11-25 ENCOUNTER — Ambulatory Visit: Payer: Medicare Other | Admitting: *Deleted

## 2021-11-25 DIAGNOSIS — J4550 Severe persistent asthma, uncomplicated: Secondary | ICD-10-CM

## 2021-12-01 ENCOUNTER — Other Ambulatory Visit: Payer: Self-pay | Admitting: Internal Medicine

## 2021-12-01 ENCOUNTER — Other Ambulatory Visit (INDEPENDENT_AMBULATORY_CARE_PROVIDER_SITE_OTHER): Payer: Self-pay | Admitting: Nurse Practitioner

## 2021-12-01 DIAGNOSIS — E559 Vitamin D deficiency, unspecified: Secondary | ICD-10-CM

## 2022-01-03 ENCOUNTER — Other Ambulatory Visit: Payer: Self-pay | Admitting: *Deleted

## 2022-01-04 ENCOUNTER — Telehealth: Payer: Self-pay | Admitting: Internal Medicine

## 2022-01-04 DIAGNOSIS — J4550 Severe persistent asthma, uncomplicated: Secondary | ICD-10-CM

## 2022-01-04 DIAGNOSIS — J310 Chronic rhinitis: Secondary | ICD-10-CM

## 2022-01-04 NOTE — Telephone Encounter (Signed)
Called and left voicemail for patient to call office back to go over medications she is needing refilled and to verify pharmacy.

## 2022-01-05 MED ORDER — FLOVENT HFA 110 MCG/ACT IN AERO: INHALATION_SPRAY | RESPIRATORY_TRACT | 5 refills | Status: DC

## 2022-01-05 MED ORDER — ADVAIR HFA 230-21 MCG/ACT IN AERO: 2.0000 | INHALATION_SPRAY | Freq: Two times a day (BID) | RESPIRATORY_TRACT | 3 refills | Status: DC

## 2022-01-05 MED ORDER — MONTELUKAST SODIUM 10 MG PO TABS: 10.0000 mg | ORAL_TABLET | Freq: Every day | ORAL | 11 refills | Status: DC

## 2022-01-05 NOTE — Telephone Encounter (Signed)
Pt called back to verify which meds was needing to have refilled. I have sent meds to preferred pharmacy. Nothing further needed.

## 2022-01-07 ENCOUNTER — Other Ambulatory Visit: Payer: Self-pay | Admitting: Internal Medicine

## 2022-01-12 ENCOUNTER — Telehealth: Payer: Self-pay | Admitting: Internal Medicine

## 2022-01-12 MED ORDER — PANTOPRAZOLE SODIUM 40 MG PO TBEC: 40.0000 mg | DELAYED_RELEASE_TABLET | Freq: Two times a day (BID) | ORAL | 0 refills | Status: DC

## 2022-01-12 MED ORDER — VALACYCLOVIR HCL 500 MG PO TABS: 500.0000 mg | ORAL_TABLET | Freq: Two times a day (BID) | ORAL | 3 refills | Status: DC

## 2022-01-12 NOTE — Telephone Encounter (Signed)
Patients need the valcyclovir and pantoprazole - she needs a month's worth sent until she sees her new physician.    Please send to [facility]. [address], [place]

## 2022-01-12 NOTE — Telephone Encounter (Signed)
Faxed in today. 

## 2022-01-17 ENCOUNTER — Other Ambulatory Visit: Payer: Self-pay | Admitting: Internal Medicine

## 2022-05-27 ENCOUNTER — Encounter: Payer: Medicare Other | Admitting: Internal Medicine

## 2023-03-28 DIAGNOSIS — F419 Anxiety disorder, unspecified: Secondary | ICD-10-CM | POA: Insufficient documentation

## 2023-09-11 DIAGNOSIS — Z0289 Encounter for other administrative examinations: Secondary | ICD-10-CM

## 2023-09-27 ENCOUNTER — Telehealth: Payer: Self-pay

## 2023-09-27 ENCOUNTER — Other Ambulatory Visit: Payer: Self-pay

## 2023-09-27 ENCOUNTER — Ambulatory Visit: Admitting: Internal Medicine

## 2023-09-27 ENCOUNTER — Other Ambulatory Visit (HOSPITAL_COMMUNITY): Payer: Self-pay

## 2023-09-27 ENCOUNTER — Encounter: Payer: Self-pay | Admitting: Internal Medicine

## 2023-09-27 ENCOUNTER — Other Ambulatory Visit (HOSPITAL_BASED_OUTPATIENT_CLINIC_OR_DEPARTMENT_OTHER): Payer: Self-pay

## 2023-09-27 VITALS — BP 124/78 | HR 75 | Ht 65.0 in | Wt 197.2 lb

## 2023-09-27 DIAGNOSIS — J310 Chronic rhinitis: Secondary | ICD-10-CM | POA: Diagnosis not present

## 2023-09-27 DIAGNOSIS — K219 Gastro-esophageal reflux disease without esophagitis: Secondary | ICD-10-CM

## 2023-09-27 DIAGNOSIS — J4550 Severe persistent asthma, uncomplicated: Secondary | ICD-10-CM | POA: Diagnosis not present

## 2023-09-27 DIAGNOSIS — Z87891 Personal history of nicotine dependence: Secondary | ICD-10-CM

## 2023-09-27 DIAGNOSIS — R768 Other specified abnormal immunological findings in serum: Secondary | ICD-10-CM

## 2023-09-27 MED ORDER — AIRSUPRA 90-80 MCG/ACT IN AERO
2.0000 | INHALATION_SPRAY | RESPIRATORY_TRACT | 5 refills | Status: AC | PRN
  Filled 2023-09-27: qty 10.7, 15d supply, fill #0
  Filled 2023-11-30: qty 10.7, 15d supply, fill #1

## 2023-09-27 MED ORDER — SPIRIVA RESPIMAT 1.25 MCG/ACT IN AERS
2.0000 | INHALATION_SPRAY | Freq: Every day | RESPIRATORY_TRACT | 11 refills | Status: AC
  Filled 2023-09-27: qty 12, 90d supply, fill #0
  Filled 2024-01-06: qty 8, 60d supply, fill #1
  Filled 2024-01-08: qty 4, 30d supply, fill #1
  Filled 2024-04-03 – 2024-04-04 (×2): qty 12, 90d supply, fill #2

## 2023-09-27 MED ORDER — FLUTICASONE-SALMETEROL 230-21 MCG/ACT IN AERO
2.0000 | INHALATION_SPRAY | Freq: Two times a day (BID) | RESPIRATORY_TRACT | 12 refills | Status: DC
  Filled 2023-09-27: qty 12, 30d supply, fill #0
  Filled 2023-11-02: qty 12, 30d supply, fill #1
  Filled 2023-11-30: qty 12, 30d supply, fill #2
  Filled 2024-01-06: qty 12, 30d supply, fill #3
  Filled 2024-02-05: qty 12, 30d supply, fill #4
  Filled 2024-02-22: qty 12, 30d supply, fill #5

## 2023-09-27 MED ORDER — MONTELUKAST SODIUM 10 MG PO TABS
10.0000 mg | ORAL_TABLET | Freq: Every day | ORAL | 11 refills | Status: AC
  Filled 2023-09-27 – 2023-11-09 (×2): qty 30, 30d supply, fill #0
  Filled 2023-12-04: qty 30, 30d supply, fill #1
  Filled 2024-01-06: qty 30, 30d supply, fill #2
  Filled 2024-02-13: qty 30, 30d supply, fill #3

## 2023-09-27 NOTE — Telephone Encounter (Signed)
 Received message from provider that pt will be Dupixent New Start.  Submitted a Prior Authorization request to [facility] ADVANTAGE/RX ADVANCE for DUPIXENT via CoverMyMeds. Authorization has been APPROVED from 09/27/2023 to 03/25/2024.     Test Claim revealed that a 28 day supply has a copay of $68.87. The price is currently the same for both 4mL and 8mL.   Patient can fill through [facility]: [phone]     CMM KEY: [facility] Authorization#: [id number]   Attempted to contact pt to discuss, left VoiceMail requesting a return call. Direct office number provided.

## 2023-09-27 NOTE — Telephone Encounter (Signed)
 Spoke with pt, she states she is agreeable with copay. Will forward to [facility] for clinical f/u.

## 2023-09-27 NOTE — Telephone Encounter (Signed)
 Dupixent paperwork handed into pharmacy

## 2023-09-27 NOTE — Patient Instructions (Signed)
 It was a pleasure to see you today!  Please schedule follow up with myself in 3 months.  If my schedule is not open yet, we will contact you with a reminder closer to that time. Please call [phone] if you haven't heard from us  a month before, and always call us  sooner if issues or concerns arise. You can also send us  a message through MyChart, but but aware that this is not to be used for urgent issues and it may take up to 5-7 days to receive a reply. Please be aware that you will likely be able to view your results before I have a chance to respond to them. Please give us  5 business days to respond to any non-urgent results.    You are having exacerbations of asthma multiple times a year requiring steroids despite being on maximum inhaler therapy and xolair . I think we should switch your xolair  to dupixent.   Switching you from symbicort  to high dose advair  for your asthma due to insurance coverage. 2 puffs twice a day, gargle after use. Continue spiriva  once a day.   instead albuterol  for your rescue therapy, let's try albuterol -budesonide  aka airsupra . Every 4 hours as needed for shortness of breath, wheezing, cough, asthma symptoms.  Continue montelukast  with claritin 

## 2023-09-27 NOTE — Telephone Encounter (Signed)
 ATC patient to schedule DPX new start. Unable to reach. Left VM requesting return call

## 2023-09-27 NOTE — Progress Notes (Signed)
 Dana Mccoy    [id number]    1955/09/16  Primary Care Physician:Burns, Dana PARAS, MD Date of Appointment: 09/27/2023 Established Patient Visit  Chief complaint:   Chief Complaint  Patient presents with   Follow-up    Pt states      HPI: Dana Mccoy is a 68 y.o. woman with severe persistent asthma with elevated IgE and sensitivity to aeroallergens on xolair  since early 2023.  Allergist - Dr. Iva  Interval Updates: Here for follow up after 2 years. Had moved to [place] and now has moved back.   In [place] she was switched from xolair  to home injections monthly.  She does feel like the cough has improved from her rhinitis on current therapy.   Has had URI x 2, RSV (not hospitalized,) and pneumonia.   Symbicort  no longer covered.  Albuterol  use is before exercise and with humidity. Does perceive benefit.   Current Regimen: symbicort  160 2 puffs twice daily with spiriva , xolair .  Asthma Triggers: She does have environmental allergies in the fall - dust, mold, grass. URIs Exacerbations in the last year:  at least 4 exacerbations in the last 12 months.  History of hospitalization or intubation: never intubated Allergy Testing: yes food allergies tested multiple - all negative. Eats nuts fine. Had SPT done for environmental allergies many years ago.  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  Exposures: lives at home alone with dog.  Smoking history: former smoker 15 pack years, quit in 1987.   I have reviewed the patient's family social and past medical history and updated as appropriate.   Past Medical History:  Diagnosis  Date   Anemia    Angio-edema    Anxiety    Asthma exacerbation, mild 02/12/2020   Back pain    Cervical stenosis of spine    Frequent headaches    GERD (gastroesophageal reflux disease)    Hyperlipidemia    Hypertension    IBS (irritable bowel syndrome)    Joint pain    Metabolic syndrome    Osteoarthritis    Other fatigue    Palpitations    Prediabetes    Reactive airway disease    post CAP   Shortness of breath    Shortness of breath on exertion    Swallowing difficulty     Past Surgical History:  Procedure Laterality Date   CARDIAC CATHETERIZATION  1997   negative   COLONOSCOPY  2007   Dr Donnald   DILATION AND CURETTAGE OF UTERUS     UPPER GI ENDOSCOPY     X 2   WISDOM TOOTH EXTRACTION      Family History  Adopted: Yes  Problem Relation Age of Onset   Cancer Mother    Allergic rhinitis Mother    Asthma Neg Hx    Eczema Neg Hx    Urticaria Neg Hx     Social History   Occupational History   Occupation: Copywriter, advertising: [employer]    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  Gen:     Well appearing, no distress Lungs:    ctab no wheezes or crackles CV:         RRR no mrg  Data Reviewed: Imaging: I have personally reviewed the chest xray 11/2020 no acute pulmonary process  PFTs:      No data to display         Feno is 11 ppb today.  Spirometry  normal 10/27/20  Labs: IgE 144 in Nov 2022  Immunization status: Immunization History  Administered Date(s) Administered   Influenza, High Dose Seasonal PF 12/10/2020   Influenza,inj,Quad PF,6+ Mos 11/16/2016   Influenza-Unspecified 12/12/2017   Pfizer Covid-19 Vaccine Bivalent Booster 59yrs & up 12/24/2020   Pneumococcal Polysaccharide-23 07/31/2012   Tdap 07/22/2021    External Records Personally Reviewed: pulmonary, allergy,   Assessment:  Severe persistent asthma not well controlled, recurrent exacerbations requiring steroids Allergic rhinitis with aeroallergen sensitivity, controlled Elevated IGE GERD, controlled   Plan/Recommendations: You are having exacerbations of asthma multiple times a year requiring steroids despite being on maximum inhaler therapy and xolair . I think we should switch your xolair  to dupixent.   Switching you from symbicort  to high dose advair  for your asthma due to insurance coverage. 2 puffs twice a day, gargle after use. Continue spiriva  once a day.   instead albuterol  for your rescue therapy, let's try albuterol -budesonide  aka airsupra . Every 4 hours as needed for shortness of breath, wheezing, cough, asthma symptoms.  Continue montelukast  with claritin   Continue acid reflux treatment.    Return to Care: Return in about 3 months (around 12/28/2023).    Dana Gore, MD Pulmonary and Critical Care Medicine [facility] [place] [phone]

## 2023-09-28 NOTE — Telephone Encounter (Signed)
 Copied from CRM [id number]. Topic: Appointments - Scheduling Inquiry for Clinic >> Sep 28, 2023 10:49 AM Dana Mccoy wrote: Reason for CRM: Patient returning missed phone call from pharmacist to schedule her Dupixent injection.   Callback number: [phone]

## 2023-09-28 NOTE — Telephone Encounter (Signed)
 Patient scheduled for DPX new start on 10/03/2050. Will use sample

## 2023-10-02 NOTE — Progress Notes (Unsigned)
 HPI Patient presents today to [facility] to see pharmacy team for Dupixent  new start.  Past medical history includes severe persistent asthma, HTN, GERD, and HLD.  Respiratory Medications Current regimen: Advair  HFA 230-21 mcg/act. Inhale 2 puffs into the lungs 2 times daily. Spiriva  Respimat 1.25 mcg/act. Inhale 2 puffs into the lungs daily. Tried in past: Xolair  and Symbicort  Patient reports no known adherence challenges  OBJECTIVE Allergies  Allergen Reactions   Penicillins     RASH, amoxicillin   Lipitor [Atorvastatin ] Other (See Comments)    Cognitive impact    Outpatient Encounter Medications as of 10/03/2023  Medication Sig   acetaminophen (TYLENOL) 500 MG tablet Take 500 mg by mouth as needed.   albuterol  (VENTOLIN  HFA) 108 (90 Base) MCG/ACT inhaler INHALE 1-2 PUFFS INTO THE LUNGS EVERY 4 HOURS AS NEEDED FOR WHEEZING OR SHORTNESS OF BREATH   Albuterol -Budesonide  (AIRSUPRA ) 90-80 MCG/ACT AERO Inhale 2 puffs into the lungs every 4 (four) hours as needed (wheezing, shortness of breath).   Calcium  Carbonate-Vit D-Min (CALCIUM  600+D PLUS MINERALS) 600-400 MG-UNIT CHEW Chew 1 each by mouth daily.   FLUoxetine  (PROZAC ) 20 MG capsule TAKE 1 CAPSULE BY MOUTH EVERY DAY   fluticasone  (VERAMYST) 27.5 MCG/SPRAY nasal spray Place 2 sprays into the nose daily.   fluticasone -salmeterol (ADVAIR  HFA) 230-21 MCG/ACT inhaler Inhale 2 puffs into the lungs 2 (two) times daily.   hydrochlorothiazide  (MICROZIDE ) 12.5 MG capsule TAKE 1 CAPSULE BY MOUTH EVERY DAY   LACTASE ENZYME PO Take 1 each by mouth as needed.   loratadine  (CLARITIN ) 10 MG tablet Take 1 tablet (10 mg total) by mouth 2 (two) times daily.   mometasone  (ELOCON ) 0.1 % cream APPLY A THIN LAYER TO THE AFFECTED AREA OF THE SKIN DAILY   montelukast  (SINGULAIR ) 10 MG tablet Take 1 tablet (10 mg total) by mouth at bedtime.   Multiple Vitamin (MULTIVITAMIN) tablet Take 1 tablet by mouth daily.   nystatin  (MYCOSTATIN ) 100000 UNIT/ML  suspension Take 5 mLs (500,000 Units total) by mouth 4 (four) times daily. Use for 10 days   pantoprazole  (PROTONIX ) 40 MG tablet Take 1 tablet (40 mg total) by mouth 2 (two) times daily.   rosuvastatin  (CRESTOR ) 5 MG tablet Take 1 tablet (5 mg total) by mouth daily.   Tiotropium Bromide  Monohydrate (SPIRIVA  RESPIMAT) 1.25 MCG/ACT AERS Inhale 2 puffs into the lungs daily.   valACYclovir  (VALTREX ) 500 MG tablet Take 1 tablet (500 mg total) by mouth 2 (two) times daily.   vitamin C (ASCORBIC ACID) 500 MG tablet Take 500 mg by mouth daily.   Vitamin D , Ergocalciferol , (DRISDOL ) 1.25 MG (50000 UNIT) CAPS capsule Take 1 capsule (50,000 Units total) by mouth every 14 (fourteen) days.   [DISCONTINUED] omeprazole  (PRILOSEC) 40 MG capsule TAKE 1 CAPSULE BY MOUTH ONCE DAILY   Facility-Administered Encounter Medications as of 10/03/2023  Medication   omalizumab  (XOLAIR ) prefilled syringe 225 mg     Immunization History  Administered Date(s) Administered   Influenza, High Dose Seasonal PF 12/10/2020   Influenza,inj,Quad PF,6+ Mos 11/16/2016   Influenza-Unspecified 12/12/2017   Pfizer Covid-19 Vaccine Bivalent Booster 90yrs & up 12/24/2020   Pneumococcal Polysaccharide-23 07/31/2012   Tdap 07/22/2021     PFTs     No data to display           Eosinophils Most recent blood eosinophil count was 100 taken on 04/20/2021.   IgE: 144 on 02/23/2021   Assessment   Biologics training for dupilumab  (Dupixent )  Goals of therapy: Mechanism:  human monoclonal IgG4 antibody that inhibits interleukin-4 and interleukin-13 cytokine-induced responses, including release of proinflammatory cytokines, chemokines, and IgE Reviewed that Dupixent  is add-on medication and patient must continue maintenance inhaler regimen. Response to therapy: may take 4 months to determine efficacy. Discussed that patients generally feel improvement sooner than 4 months.  Side effects: injection site reaction (6-18%), antibody  development (5-16%), ophthalmic conjunctivitis (2-16%), transient blood eosinophilia (1-2%)  Dose: 600mg  at Week 0 (administered today in clinic) followed by 300mg  every 14 days thereafter  Administration/Storage:  Reviewed administration sites of thigh or abdomen (at least 2-3 inches away from abdomen). Reviewed the upper arm is only appropriate if caregiver is administering injection  Do not shake pen/syringe as this could lead to product foaming or precipitation. Do not use if solution is discolored or contains particulate matter or if window on prefilled pen is yellow (indicates pen has been used).  Reviewed storage of medication in refrigerator. Reviewed that Dupixent  can be stored at room temperature in unopened carton for up to 14 days.  Access: Approval of Dupixent  through: insurance  Patient self-administered Dupixent  300mg /27ml x 2 (total dose 600mg ) in left lower abdomen and left upper thigh using sample Dupixent  300mg /36mL autoinjector pen NDC: 9975-4084-79 Lot: [id number] Expiration: 03/13/2025  Patient monitored for 30 minutes for adverse reaction.  Patient tolerated injection well. Patient initially struggled with first injection due to improper hold of medication while self-administering. Patient required some help during the first injection but did not lose any of the dose. Patient adjusted grip on autoinjector pen which seemed to help. Second injection went smoothly. Patient advised to call or message us  if she has any questions or difficulty self administering in the future.   Injection site noted. Patient denies itchiness and irritation at injection., No swelling or redness noted., and Reviewed injection site reaction management with patient verbally and printed information for review in AVS  Medication Reconciliation  A drug regimen assessment was performed, including review of allergies, interactions, disease-state management, dosing and immunization history. Medications were  reviewed with the patient, including name, instructions, indication, goals of therapy, potential side effects, importance of adherence, and safe use.  Drug interaction(s): none   PLAN Continue Dupixent  300mg  every 14 days.  Next dose is due 10/17/2023 and every 14 days thereafter. Rx sent to: [facility]: [phone] .  Patient provided with pharmacy phone number and advised to call later this week to schedule shipment to home.  Continue maintenance inhaler regimen of: Advair  HFA 230-21 mcg/act. Inhale 2 puffs into the lungs 2 times daily. Spiriva  Respimat 1.25 mcg/act. Inhale 2 puffs into the lungs daily.  All questions encouraged and answered.  Instructed patient to reach out with any further questions or concerns.  Thank you for allowing pharmacy to participate in this patient's care.  This appointment required 45 minutes of patient care (this includes precharting, chart review, review of results, face-to-face care, etc.).

## 2023-10-03 ENCOUNTER — Ambulatory Visit (INDEPENDENT_AMBULATORY_CARE_PROVIDER_SITE_OTHER): Admitting: Pharmacist

## 2023-10-03 DIAGNOSIS — Z7189 Other specified counseling: Secondary | ICD-10-CM

## 2023-10-03 DIAGNOSIS — J4550 Severe persistent asthma, uncomplicated: Secondary | ICD-10-CM | POA: Diagnosis not present

## 2023-10-03 MED ORDER — DUPIXENT 300 MG/2ML ~~LOC~~ SOAJ
300.0000 mg | SUBCUTANEOUS | 1 refills | Status: DC
  Filled 2023-10-09: qty 4, 28d supply, fill #0
  Filled 2023-11-01: qty 4, 28d supply, fill #1
  Filled 2023-11-17: qty 4, 28d supply, fill #2
  Filled 2023-12-21: qty 4, 28d supply, fill #3

## 2023-10-03 NOTE — Patient Instructions (Signed)
 Your next Dupixent  dose is due on 10/17/2023, 10/31/2023, and every 14 days thereafter  CONTINUE Advair  HFA 230-21 mcg/act. Inhale 2 puffs into the lungs 2 times daily. Spiriva  Respimat 1.25 mcg/act. Inhale 2 puffs into the lungs daily.  Your prescription will be shipped from [facility]. Their phone number is [phone]. Please call to schedule shipment and confirm address. They will mail your medication to your home.  You will need to be seen by your provider in 3 to 4 months to assess how Dupixent  is working for you. Please ensure you have a follow-up appointment scheduled in 3 months. Call our clinic if you need to make this appointment.  Stay up to date on all routine vaccines: influenza, pneumonia, COVID19, Shingles  How to manage an injection site reaction: Remember the 5 C's: COUNTER - leave on the counter at least 30 minutes but up to overnight to bring medication to room temperature. This may help prevent stinging COLD - place something cold (like an ice gel pack or cold water bottle) on the injection site just before cleansing with alcohol. This may help reduce pain CLARITIN  - use Claritin  (generic name is loratadine ) for the first two weeks of treatment or the day of, the day before, and the day after injecting. This will help to minimize injection site reactions CORTISONE CREAM - apply if injection site is irritated and itching CALL ME - if injection site reaction is bigger than the size of your fist, looks infected, blisters, or if you develop hives

## 2023-10-06 ENCOUNTER — Other Ambulatory Visit: Payer: Self-pay

## 2023-10-09 ENCOUNTER — Other Ambulatory Visit: Payer: Self-pay

## 2023-10-09 ENCOUNTER — Other Ambulatory Visit (HOSPITAL_COMMUNITY): Payer: Self-pay

## 2023-10-09 NOTE — Progress Notes (Signed)
 Specialty Pharmacy Initial Fill Coordination Note  Dana Mccoy is a 68 y.o. female contacted today regarding initial fill of specialty medication(s) Dupilumab  (Dupixent )   Patient requested Delivery   Delivery date: 10/12/23   Verified address: [address]   [place] [address]   Medication will be filled on 10/11/23.   Patient is aware of $0 copayment.    *NOTE* Pt requests that medication be delivered directly to her door. Please add delivery note

## 2023-10-10 ENCOUNTER — Other Ambulatory Visit: Payer: Self-pay

## 2023-10-10 NOTE — Progress Notes (Unsigned)
 Subjective:    Patient ID: Dana Mccoy, female    DOB: 01-18-1956, 68 y.o.   MRN: [id number]     HPI Cheyane is here for follow up of her chronic medical problems.    Medications and allergies reviewed with patient and updated if appropriate.  Current Outpatient Medications on File Prior to Visit  Medication Sig Dispense Refill   acetaminophen (TYLENOL) 500 MG tablet Take 500 mg by mouth as needed.     Albuterol -Budesonide  (AIRSUPRA ) 90-80 MCG/ACT AERO Inhale 2 puffs into the lungs every 4 (four) hours as needed (wheezing, shortness of breath). 10.7 g 5   aspirin 81 MG chewable tablet Chew 81 mg by mouth daily.     Calcium  Carbonate-Vit D-Min (CALCIUM  600+D PLUS MINERALS) 600-400 MG-UNIT CHEW Chew 1 each by mouth daily.     Dupilumab  (DUPIXENT ) 300 MG/2ML SOAJ Inject 300 mg into the skin every 14 (fourteen) days. 12 mL 1   FLUoxetine  (PROZAC ) 20 MG capsule TAKE 1 CAPSULE BY MOUTH EVERY DAY 90 capsule 1   fluticasone  (VERAMYST) 27.5 MCG/SPRAY nasal spray Place 2 sprays into the nose daily.     fluticasone -salmeterol (ADVAIR  HFA) 230-21 MCG/ACT inhaler Inhale 2 puffs into the lungs 2 (two) times daily. 12 g 12   hydrochlorothiazide  (MICROZIDE ) 12.5 MG capsule TAKE 1 CAPSULE BY MOUTH EVERY DAY 90 capsule 2   LACTASE ENZYME PO Take 1 each by mouth as needed.     levothyroxine  (SYNTHROID ) 25 MCG tablet Take 25 mcg by mouth daily before breakfast.     loratadine  (CLARITIN ) 10 MG tablet Take 1 tablet (10 mg total) by mouth 2 (two) times daily. 60 tablet 5   losartan  (COZAAR ) 25 MG tablet Take 25 mg by mouth daily.     mometasone  (ELOCON ) 0.1 % cream APPLY A THIN LAYER TO THE AFFECTED AREA OF THE SKIN DAILY 45 g 0   montelukast  (SINGULAIR ) 10 MG tablet Take 1 tablet (10 mg total) by mouth at bedtime. 30 tablet 11   Multiple Vitamin (MULTIVITAMIN) tablet Take 1 tablet by mouth daily.     pantoprazole  (PROTONIX ) 40 MG tablet Take 1 tablet (40 mg total) by mouth 2 (two) times daily.  180 tablet 0   rosuvastatin  (CRESTOR ) 5 MG tablet Take 1 tablet (5 mg total) by mouth daily. 90 tablet 3   sucralfate  (CARAFATE ) 1 g tablet Take by mouth 4 (four) times daily as needed.     Tiotropium Bromide  Monohydrate (SPIRIVA  RESPIMAT) 1.25 MCG/ACT AERS Inhale 2 puffs into the lungs daily. 12 g 11   [DISCONTINUED] omeprazole  (PRILOSEC) 40 MG capsule TAKE 1 CAPSULE BY MOUTH ONCE DAILY 90 capsule 0   No current facility-administered medications on file prior to visit.     Review of Systems     Objective:  There were no vitals filed for this visit. BP Readings from Last 3 Encounters:  09/27/23 124/78  11/10/21 112/60  11/04/21 102/68   Wt Readings from Last 3 Encounters:  09/27/23 197 lb 3.2 oz (89.4 kg)  11/10/21 190 lb 6 oz (86.4 kg)  11/04/21 188 lb (85.3 kg)   There is no height or weight on file to calculate BMI.    Physical Exam     Lab Results  Component Value Date   WBC 6.4 04/20/2021   HGB 13.8 04/20/2021   HCT 42.4 04/20/2021   PLT 278 04/20/2021   GLUCOSE 79 07/22/2021   CHOL 168 07/19/2021   TRIG 83 07/19/2021  HDL 52 07/19/2021   LDLDIRECT 150.0 09/06/2019   LDLCALC 100 (H) 07/19/2021   ALT 18 07/22/2021   AST 26 07/22/2021   NA 145 (H) 07/22/2021   K 3.8 07/22/2021   CL 104 07/22/2021   CREATININE 0.79 07/22/2021   BUN 18 07/22/2021   CO2 25 07/22/2021   TSH 4.270 04/20/2021   HGBA1C 5.7 (H) 07/19/2021     Assessment & Plan:    See Problem List for Assessment and Plan of chronic medical problems.

## 2023-10-11 ENCOUNTER — Encounter: Payer: Self-pay | Admitting: Internal Medicine

## 2023-10-11 ENCOUNTER — Other Ambulatory Visit: Payer: Self-pay

## 2023-10-11 ENCOUNTER — Other Ambulatory Visit (HOSPITAL_BASED_OUTPATIENT_CLINIC_OR_DEPARTMENT_OTHER): Payer: Self-pay

## 2023-10-11 ENCOUNTER — Ambulatory Visit (INDEPENDENT_AMBULATORY_CARE_PROVIDER_SITE_OTHER): Admitting: Internal Medicine

## 2023-10-11 VITALS — BP 110/78 | HR 61 | Temp 98.1°F | Ht 65.0 in | Wt 194.0 lb

## 2023-10-11 DIAGNOSIS — R519 Headache, unspecified: Secondary | ICD-10-CM

## 2023-10-11 DIAGNOSIS — M8580 Other specified disorders of bone density and structure, unspecified site: Secondary | ICD-10-CM | POA: Insufficient documentation

## 2023-10-11 DIAGNOSIS — R7303 Prediabetes: Secondary | ICD-10-CM | POA: Diagnosis not present

## 2023-10-11 DIAGNOSIS — G8929 Other chronic pain: Secondary | ICD-10-CM | POA: Diagnosis not present

## 2023-10-11 DIAGNOSIS — E782 Mixed hyperlipidemia: Secondary | ICD-10-CM

## 2023-10-11 DIAGNOSIS — I10 Essential (primary) hypertension: Secondary | ICD-10-CM

## 2023-10-11 DIAGNOSIS — R251 Tremor, unspecified: Secondary | ICD-10-CM | POA: Diagnosis not present

## 2023-10-11 DIAGNOSIS — E063 Autoimmune thyroiditis: Secondary | ICD-10-CM | POA: Insufficient documentation

## 2023-10-11 DIAGNOSIS — R5383 Other fatigue: Secondary | ICD-10-CM

## 2023-10-11 DIAGNOSIS — R0602 Shortness of breath: Secondary | ICD-10-CM | POA: Diagnosis not present

## 2023-10-11 DIAGNOSIS — K219 Gastro-esophageal reflux disease without esophagitis: Secondary | ICD-10-CM | POA: Diagnosis not present

## 2023-10-11 DIAGNOSIS — E041 Nontoxic single thyroid nodule: Secondary | ICD-10-CM | POA: Insufficient documentation

## 2023-10-11 DIAGNOSIS — J383 Other diseases of vocal cords: Secondary | ICD-10-CM | POA: Diagnosis not present

## 2023-10-11 MED ORDER — FLUOXETINE HCL 20 MG PO CAPS
20.0000 mg | ORAL_CAPSULE | Freq: Every day | ORAL | 1 refills | Status: AC
  Filled 2023-10-11: qty 90, 90d supply, fill #0
  Filled 2024-02-13: qty 90, 90d supply, fill #1

## 2023-10-11 MED ORDER — LEVOTHYROXINE SODIUM 25 MCG PO TABS
25.0000 ug | ORAL_TABLET | Freq: Every day | ORAL | 2 refills | Status: DC
  Filled 2023-10-11: qty 90, 90d supply, fill #0

## 2023-10-11 MED ORDER — HYDROCHLOROTHIAZIDE 12.5 MG PO CAPS
12.5000 mg | ORAL_CAPSULE | Freq: Every day | ORAL | 2 refills | Status: AC
  Filled 2023-10-11: qty 90, 90d supply, fill #0
  Filled 2024-01-06: qty 90, 90d supply, fill #1
  Filled 2024-04-04: qty 90, 90d supply, fill #2

## 2023-10-11 MED ORDER — PANTOPRAZOLE SODIUM 40 MG PO TBEC: 40.0000 mg | DELAYED_RELEASE_TABLET | Freq: Every day | ORAL | Status: DC

## 2023-10-11 MED ORDER — PANTOPRAZOLE SODIUM 40 MG PO TBEC
40.0000 mg | DELAYED_RELEASE_TABLET | Freq: Every day | ORAL | 2 refills | Status: AC
  Filled 2023-10-11: qty 90, 90d supply, fill #0
  Filled 2024-02-13: qty 90, 90d supply, fill #1

## 2023-10-11 MED ORDER — ROSUVASTATIN CALCIUM 5 MG PO TABS
5.0000 mg | ORAL_TABLET | Freq: Every day | ORAL | 3 refills | Status: AC
  Filled 2023-10-11: qty 90, 90d supply, fill #0
  Filled 2024-02-01: qty 90, 90d supply, fill #1

## 2023-10-11 MED ORDER — LOSARTAN POTASSIUM 25 MG PO TABS
25.0000 mg | ORAL_TABLET | Freq: Every day | ORAL | 2 refills | Status: AC
  Filled 2023-10-11: qty 90, 90d supply, fill #0
  Filled 2024-02-01: qty 90, 90d supply, fill #1

## 2023-10-11 MED ORDER — EPINEPHRINE 0.3 MG/0.3ML IJ SOAJ
0.3000 mg | INTRAMUSCULAR | 3 refills | Status: AC | PRN
  Filled 2023-10-11: qty 2, 30d supply, fill #0

## 2023-10-11 NOTE — Assessment & Plan Note (Signed)
 Chronic Last ultrasound July 2024-due to recheck Ultrasound ordered TSH 2 months ago normal range

## 2023-10-11 NOTE — Assessment & Plan Note (Signed)
 New Has Hashimoto's Also has thyroid  nodules Last TSH 2 months ago in normal range Continue levothyroxine  25 mcg daily Ultrasound ordered to follow-up on thyroid  nodule

## 2023-10-11 NOTE — Assessment & Plan Note (Signed)
 Chronic Blood pressure well controlled Continue hydrochlorothiazide  12.5 mg daily, losartan  25 mg daily

## 2023-10-11 NOTE — Assessment & Plan Note (Signed)
 Chronic Occasional shortness of breath with minimal activity Had several heart tests over the last couple years which were all reassuring Has follow-up with cardiology in October Asthma is well-controlled

## 2023-10-11 NOTE — Assessment & Plan Note (Addendum)
 Chronic Family history unknown Occurs with activity ? Essential tremor Does drop things Hand writing has changed Can consider discussing with neurology

## 2023-10-11 NOTE — Assessment & Plan Note (Signed)
 Chronic GERD controlled Continue pantoprazole 40 mg daily

## 2023-10-11 NOTE — Assessment & Plan Note (Signed)
 Chronic Lipids have been well controlled-last blood work was a couple of months ago Continue Crestor  5 mg daily

## 2023-10-11 NOTE — Assessment & Plan Note (Signed)
 Chronic No obvious cause Had blood work done a couple of months ago that looked normal-vitamin B12, vitamin D  level and TSH are normal range.  Blood counts normal ?  Possible OSA-not sure how much her weight is changed but I wonder if that could be causing some of her fatigue Referral to neurology for further evaluation of possible sleep apnea

## 2023-10-11 NOTE — Assessment & Plan Note (Signed)
 Chronic Last A1c in prediabetic range Will recheck in 6 months

## 2023-10-11 NOTE — Assessment & Plan Note (Addendum)
 Chronic Intermittent hoarseness Saw ENT Vocal cords not closing completely If gets worse can consider speech therapy

## 2023-10-11 NOTE — Assessment & Plan Note (Signed)
 New Had bone density in [place]  over the past couple of years Continue calcium  and vitamin D  Continue regular exercise Will recheck after 2 years

## 2023-10-11 NOTE — Patient Instructions (Addendum)
    Medications changes include :   None    A referral was ordered for neurology and someone will call you to schedule an appointment.    A thyroid  ultrasound was ordered.      Return in about 6 months (around 04/12/2024) for follow up.

## 2023-10-13 ENCOUNTER — Ambulatory Visit
Admission: RE | Admit: 2023-10-13 | Discharge: 2023-10-13 | Disposition: A | Discharge status: Home / Self Care | Source: Ambulatory Visit | Attending: Internal Medicine | Admitting: Internal Medicine

## 2023-10-13 DIAGNOSIS — E041 Nontoxic single thyroid nodule: Secondary | ICD-10-CM

## 2023-10-13 NOTE — Progress Notes (Signed)
 Patient started Dupixent  in office on 10/03/2023  Continue Dupixent  300mg  every 14 days.  Next dose is due 10/17/2023 and every 14 days thereafter   Continue maintenance inhaler regimen of: Advair  HFA 230-21 mcg/act. Inhale 2 puffs into the lungs 2 times daily. Spiriva  Respimat 1.25 mcg/act. Inhale 2 puffs into the lungs daily.  Sherry Pennant, PharmD, MPH, BCPS, CPP Clinical Pharmacist (Rheumatology and Pulmonology)

## 2023-10-19 ENCOUNTER — Other Ambulatory Visit: Payer: Self-pay

## 2023-10-19 ENCOUNTER — Ambulatory Visit: Payer: Self-pay | Admitting: Internal Medicine

## 2023-10-19 DIAGNOSIS — E041 Nontoxic single thyroid nodule: Secondary | ICD-10-CM

## 2023-10-23 ENCOUNTER — Ambulatory Visit: Admitting: Internal Medicine

## 2023-10-24 ENCOUNTER — Encounter: Payer: Self-pay | Admitting: Internal Medicine

## 2023-10-24 ENCOUNTER — Other Ambulatory Visit: Payer: Self-pay | Admitting: Internal Medicine

## 2023-10-24 DIAGNOSIS — R5383 Other fatigue: Secondary | ICD-10-CM

## 2023-10-27 ENCOUNTER — Other Ambulatory Visit: Payer: Self-pay

## 2023-11-01 ENCOUNTER — Other Ambulatory Visit: Payer: Self-pay

## 2023-11-01 NOTE — Progress Notes (Signed)
 Specialty Pharmacy Refill Coordination Note  Dana Mccoy is a 68 y.o. female contacted today regarding refills of specialty medication(s) Dupilumab  (Dupixent )   Patient requested Delivery   Delivery date: 11/09/23   Verified address: [address]   [place] [address]   Medication will be filled on 11/08/23.

## 2023-11-02 ENCOUNTER — Other Ambulatory Visit: Payer: Self-pay

## 2023-11-02 ENCOUNTER — Other Ambulatory Visit (HOSPITAL_BASED_OUTPATIENT_CLINIC_OR_DEPARTMENT_OTHER): Payer: Self-pay

## 2023-11-03 ENCOUNTER — Other Ambulatory Visit: Payer: Self-pay

## 2023-11-03 NOTE — Progress Notes (Signed)
 Patient called back today stating that her injection misfired. Provided patient with Dupixent  [employer] phone number for replacement. Patient's refill is due now, so patient will pick up her refill today instead of having it mailed next week.

## 2023-11-06 ENCOUNTER — Encounter (INDEPENDENT_AMBULATORY_CARE_PROVIDER_SITE_OTHER): Payer: Self-pay

## 2023-11-07 ENCOUNTER — Encounter: Payer: Self-pay | Admitting: Nurse Practitioner

## 2023-11-07 ENCOUNTER — Ambulatory Visit (INDEPENDENT_AMBULATORY_CARE_PROVIDER_SITE_OTHER): Admitting: Nurse Practitioner

## 2023-11-07 VITALS — BP 129/78 | HR 63 | Temp 97.4°F | Ht 65.0 in | Wt 200.2 lb

## 2023-11-07 DIAGNOSIS — J383 Other diseases of vocal cords: Secondary | ICD-10-CM | POA: Diagnosis not present

## 2023-11-07 DIAGNOSIS — R42 Dizziness and giddiness: Secondary | ICD-10-CM | POA: Insufficient documentation

## 2023-11-07 DIAGNOSIS — J4550 Severe persistent asthma, uncomplicated: Secondary | ICD-10-CM

## 2023-11-07 DIAGNOSIS — R1310 Dysphagia, unspecified: Secondary | ICD-10-CM

## 2023-11-07 DIAGNOSIS — G4719 Other hypersomnia: Secondary | ICD-10-CM

## 2023-11-07 NOTE — Assessment & Plan Note (Signed)
 Difficulties with hoarse voice quality, DOE, and swallowing. Unremarkable GI workup. VCD evident on prior ENT eval. Referral to OP speech therapy

## 2023-11-07 NOTE — Assessment & Plan Note (Signed)
 Follow up with cardiology as scheduled.

## 2023-11-07 NOTE — Patient Instructions (Addendum)
 Given your symptoms, I am concerned that you may have sleep disordered breathing with sleep apnea. You will need a sleep study for further evaluation. Someone will contact you to schedule this.   We discussed how untreated sleep apnea puts an individual at risk for cardiac arrhthymias, pulm HTN, DM, stroke and increases their risk for daytime accidents. We also briefly reviewed treatment options including weight loss, side sleeping position, oral appliance, CPAP therapy or referral to ENT for possible surgical options  Use caution when driving and pull over if you become sleepy.  Referral to speech therapy for vocal cord dysfunction and swallowing practices   Follow up in 6 weeks with Dana Beckhem Isadore,NP to go over sleep study results, or sooner, if needed. Friday PM virtual clinic preferred

## 2023-11-07 NOTE — Assessment & Plan Note (Signed)
 She has snoring, excessive daytime sleepiness, restless sleep, morning headaches. BMI 33. History of RLS. Given this,  I am concerned she could have sleep disordered breathing with obstructive sleep apnea. She will need sleep study for further evaluation.    - discussed how weight can impact sleep and risk for sleep disordered breathing - discussed options to assist with weight loss: combination of diet modification, cardiovascular and strength training exercises   - had an extensive discussion regarding the adverse health consequences related to untreated sleep disordered breathing - specifically discussed the risks for hypertension, coronary artery disease, cardiac dysrhythmias, cerebrovascular disease, and diabetes - lifestyle modification discussed   - discussed how sleep disruption can increase risk of accidents, particularly when driving - safe driving practices were discussed  Patient Instructions  Given your symptoms, I am concerned that you may have sleep disordered breathing with sleep apnea. You will need a sleep study for further evaluation. Someone will contact you to schedule this.   We discussed how untreated sleep apnea puts an individual at risk for cardiac arrhthymias, pulm HTN, DM, stroke and increases their risk for daytime accidents. We also briefly reviewed treatment options including weight loss, side sleeping position, oral appliance, CPAP therapy or referral to ENT for possible surgical options  Use caution when driving and pull over if you become sleepy.  Referral to speech therapy for vocal cord dysfunction and swallowing practices   Follow up in 6 weeks with Katie Macklin Jacquin,NP to go over sleep study results, or sooner, if needed. Friday PM virtual clinic preferred

## 2023-11-07 NOTE — Progress Notes (Signed)
 @Patient  ID: Dana Mccoy, female    DOB: 04/23/1955, 68 y.o.   MRN: [id number]  Chief Complaint  Patient presents with   Follow-up    Pt states she is here for sleep.  Pt had sleep study done at [facility] 5 years ago. (05/31/2018) Pt is not currently using CPAP or BiPap    Referring provider: Geofm Glade PARAS, MD  HPI: 68 year old female, former smoker followed for asthma. She is a patient of Dr. Correne and last seen in office 09/27/2023. She is referred for sleep consult. Past medical history significant for HTN, GERD, hypothyroid, DDD, VCD, RLS, prediabetes, obesity, HLD.  TEST/EVENTS:  2020 PSG: negative for OSA; periodic limb movement  10/28/2021 spiro: FVC 98, FEV1 100, ratio 80. Normal lung function   09/27/2023: Ov with Dr. Meade. Severe persistent asthma with allergic phenotype. Follows with allergist. Had moved to [place] and now moved back. In [place] she was switched to home injections of Xolair . Cough improved with rhinitis management. Had RSV pna; not hospitalized. Albuterol  use before exercise and with humidity. On symbicort  and spiriva . Switched to high dose Advair . Change Xolair  to Dupixent  due to persistent exacerbations. Changed to Airsupra  for rescue.   11/07/2023: Today - sleep consult  Discussed the use of AI scribe software for clinical note transcription with the patient, who gave verbal consent to proceed.  History of Present Illness Dana Mccoy is a 68 year old female who presents with excessive daytime fatigue. She was referred by her primary doctor for a sleep study due to excessive daytime fatigue.  She experiences excessive daytime fatigue despite being on levothyroxine  for about a year, which has made some difference but not resolved the issue. Her TSH levels have normalized. A sleep study five years ago was negative for sleep apnea. There have been no major changes in her weight since. No drowsy driving, but she avoids long trips alone. She occasionally  wakes up gasping and has been told she snores in the past. No sleepwalking or sleep paralysis, but sometimes wakes up feeling disoriented. She wakes up multiple times a night, varying from three to five times, and sometimes has trouble falling asleep. She takes 5 mg of melatonin, which she feels helps. She experiences restless legs occasionally but has never taken medication for it. Doesn't feel like it causes significant problems. Occasional morning headaches.   She has a history of asthma and VCD. She sees Dr. Meade for follow up next month. She has noticed more frequent and severe asthma problems in the last couple of years. She's been stable since her last visit. She also reports having a hoarse voice chronically, which led to an ENT consultation where vocal cord dysfunction was noted. She briefly saw speech therapy when she lived in [place]. She occasionally has issues with swallowing and feels like it goes down the wrong way. She has not had any recent pneumonias or recurrent infections. She had an upper endoscopy last year which showed gastric polyps but no other significant findings. No changes in bowel habits, weight loss, bloody stools, abdominal pain. No fevers, hemoptysis.   She has a history of a low resting heart rate in the low fifties, which she considers problematic. She sometimes experiences dizziness or lightheadedness with changes in position. She's seeing a cardiologist regarding this. No chest pains, syncope.     Allergies  Allergen Reactions   Penicillins     RASH, amoxicillin   Lipitor [Atorvastatin ] Other (See Comments)  Cognitive impact   Statins     Other Reaction(s): cognitive issues    Immunization History  Administered Date(s) Administered   INFLUENZA, HIGH DOSE SEASONAL PF 12/10/2020, 02/04/2022   Influenza,inj,Quad PF,6+ Mos 11/16/2016   Influenza-Unspecified 12/12/2017   Pfizer Covid-19 Vaccine Bivalent Booster 22yrs & up 12/24/2020   Pneumococcal  Polysaccharide-23 07/31/2012, 01/26/2022   Tdap 07/22/2021    Past Medical History:  Diagnosis Date   Anemia    Angio-edema    Anxiety    Asthma exacerbation, mild 02/12/2020   Back pain    Cervical stenosis of spine    Frequent headaches    GERD (gastroesophageal reflux disease)    Hyperlipidemia    Hypertension    IBS (irritable bowel syndrome)    Joint pain    Metabolic syndrome    Osteoarthritis    Other fatigue    Palpitations    Prediabetes    Reactive airway disease    post CAP   Shortness of breath    Shortness of breath on exertion    Swallowing difficulty     Tobacco History: Social History   Tobacco Use  Smoking Status Former   Current packs/day: 0.00   Average packs/day: 1.5 packs/day for 12.0 years (18.0 ttl pk-yrs)   Types: Cigarettes   Start date: 03/14/1973   Quit date: 03/14/1985   Years since quitting: 38.6  Smokeless Tobacco Never  Tobacco Comments   smoked 1975-1986, up to 1.5 ppd   Counseling given: Not Answered Tobacco comments: smoked 1975-1986, up to 1.5 ppd   Outpatient Medications Prior to Visit  Medication Sig Dispense Refill   acetaminophen (TYLENOL) 500 MG tablet Take 500 mg by mouth as needed.     Albuterol -Budesonide  (AIRSUPRA ) 90-80 MCG/ACT AERO Inhale 2 puffs into the lungs every 4 (four) hours as needed (wheezing, shortness of breath). 10.7 g 5   aspirin 81 MG chewable tablet Chew 81 mg by mouth daily.     Calcium  Carbonate-Vit D-Min (CALCIUM  600+D PLUS MINERALS) 600-400 MG-UNIT CHEW Chew 1 each by mouth daily.     Dupilumab  (DUPIXENT ) 300 MG/2ML SOAJ Inject 300 mg into the skin every 14 (fourteen) days. 12 mL 1   EPINEPHrine  0.3 mg/0.3 mL IJ SOAJ injection Inject 0.3 mg into the muscle as needed for anaphylaxis. 2 each 3   FLUoxetine  (PROZAC ) 20 MG capsule Take 1 capsule (20 mg total) by mouth daily. 90 capsule 1   fluticasone  (VERAMYST) 27.5 MCG/SPRAY nasal spray Place 2 sprays into the nose daily.     fluticasone -salmeterol  (ADVAIR  HFA) 230-21 MCG/ACT inhaler Inhale 2 puffs into the lungs 2 (two) times daily. 12 g 12   hydrochlorothiazide  (MICROZIDE ) 12.5 MG capsule Take 1 capsule (12.5 mg total) by mouth daily. 90 capsule 2   LACTASE ENZYME PO Take 1 each by mouth as needed.     levothyroxine  (SYNTHROID ) 25 MCG tablet Take 1 tablet (25 mcg total) by mouth daily before breakfast. 90 tablet 2   loratadine  (CLARITIN ) 10 MG tablet Take 1 tablet (10 mg total) by mouth 2 (two) times daily. 60 tablet 5   losartan  (COZAAR ) 25 MG tablet Take 1 tablet (25 mg total) by mouth daily. 90 tablet 2   mometasone  (ELOCON ) 0.1 % cream APPLY A THIN LAYER TO THE AFFECTED AREA OF THE SKIN DAILY 45 g 0   montelukast  (SINGULAIR ) 10 MG tablet Take 1 tablet (10 mg total) by mouth at bedtime. 30 tablet 11   Multiple Vitamin (MULTIVITAMIN) tablet Take 1  tablet by mouth daily.     pantoprazole  (PROTONIX ) 40 MG tablet Take 1 tablet (40 mg total) by mouth daily. 90 tablet 2   rosuvastatin  (CRESTOR ) 5 MG tablet Take 1 tablet (5 mg total) by mouth daily. 90 tablet 3   sucralfate  (CARAFATE ) 1 g tablet Take by mouth 4 (four) times daily as needed.     Tiotropium Bromide  Monohydrate (SPIRIVA  RESPIMAT) 1.25 MCG/ACT AERS Inhale 2 puffs into the lungs daily. 12 g 11   valACYclovir  (VALTREX ) 500 MG tablet Take 500 mg by mouth 2 (two) times daily.     No facility-administered medications prior to visit.     Review of Systems: As above; otherwise negative     Physical Exam:  BP 129/78   Pulse 63   Temp (!) 97.4 F (36.3 C)   Ht 5' 5 (1.651 m)   Wt 200 lb 3.2 oz (90.8 kg)   SpO2 98% Comment: RA  BMI 33.32 kg/m   GEN: Pleasant, interactive, well-appearing; obese; in no acute distress HEENT:  Normocephalic and atraumatic. PERRLA. Sclera white. Nasal turbinates pink, moist and patent bilaterally. No rhinorrhea present. Oropharynx pink and moist, without exudate or edema. No lesions, ulcerations, or postnasal drip. Hoarse voice quality.  Mallampati III NECK:  Supple w/ fair ROM. No lymphadenopathy.   CV: RRR, no m/r/g, no peripheral edema. Pulses intact, +2 bilaterally. No cyanosis, pallor or clubbing. PULMONARY:  Unlabored, regular breathing. Clear bilaterally A&P w/o wheezes/rales/rhonchi. No accessory muscle use.  GI: BS present and normoactive. Soft, non-tender to palpation. No organomegaly or masses detected. MSK: No erythema, warmth or tenderness. Cap refil <2 sec all extrem.  Neuro: A/Ox3. No focal deficits noted.   Skin: Warm, no lesions or rashe Psych: Normal affect and behavior. Judgement and thought content appropriate.     Lab Results:  CBC    Component Value Date/Time   WBC 6.4 04/20/2021 0922   WBC 6.0 01/21/2021 1154   RBC 4.92 04/20/2021 0922   RBC 4.74 01/21/2021 1154   HGB 13.8 04/20/2021 0922   HCT 42.4 04/20/2021 0922   PLT 278 04/20/2021 0922   MCV 86 04/20/2021 0922   MCH 28.0 04/20/2021 0922   MCH 27.8 11/18/2020 1557   MCHC 32.5 04/20/2021 0922   MCHC 31.9 01/21/2021 1154   RDW 12.5 04/20/2021 0922   LYMPHSABS 1.1 04/20/2021 0922   MONOABS 0.5 01/21/2021 1154   EOSABS 0.1 04/20/2021 0922   BASOSABS 0.0 04/20/2021 0922    BMET    Component Value Date/Time   NA 145 (H) 07/22/2021 1608   K 3.8 07/22/2021 1608   CL 104 07/22/2021 1608   CO2 25 07/22/2021 1608   GLUCOSE 79 07/22/2021 1608   GLUCOSE 81 11/18/2020 1557   GLUCOSE 77 01/24/2006 1211   BUN 18 07/22/2021 1608   CREATININE 0.79 07/22/2021 1608   CALCIUM  10.1 07/22/2021 1608   GFRNONAA >60 11/18/2020 1557   GFRAA >60 11/10/2019 0032    BNP No results found for: BNP   Imaging:  US  THYROID  Result Date: 10/16/2023 CLINICAL DATA:  Other. History of Hashimoto's thyroiditis and history of prior thyroid  ultrasound seen on outside imaging. EXAM: THYROID  ULTRASOUND TECHNIQUE: Ultrasound examination of the thyroid  gland and adjacent soft tissues was performed. COMPARISON:  None Available. FINDINGS: Parenchymal  Echotexture: Moderately heterogenous Isthmus: 0.3 cm Right lobe: 4.0 x 2.1 x 1.4 cm Left lobe: 3.5 x 2.0 x 1.1 cm _________________________________________________________ Estimated total number of nodules >/= 1 cm: 0 Number of spongiform  nodules >/=  2 cm not described below (TR1): 0 Number of mixed cystic and solid nodules >/= 1.5 cm not described below (TR2): 0 _________________________________________________________ Diffusely heterogeneous thyroid  gland consistent with the clinical history of Hashimoto's thyroiditis. There is a solitary isoechoic solid nodule measuring 0.9 x 0.8 x 0.7 cm. This lesion would be consistent with TI-RADS category 3. Given size (<1.4 cm) and appearance, this nodule does NOT meet TI-RADS criteria for biopsy or dedicated follow-up. IMPRESSION: 1. Mildly heterogeneous thyroid  gland consistent with the provided clinical history of Hashimoto's thyroiditis. 2. A solitary 0.9 cm TI-RADS category 3 nodule is noted in the left upper gland. Given size (<1.4 cm) and appearance, this nodule does NOT meet TI-RADS criteria for biopsy or dedicated follow-up. The above is in keeping with the ACR TI-RADS recommendations - J Am Coll Radiol 2017;14:587-595. Electronically Signed   By: Wilkie Lent M.D.   On: 10/16/2023 09:44    Administration History     None           No data to display          Lab Results  Component Value Date   NITRICOXIDE 11 06/03/2021        Assessment & Plan:   Excessive daytime sleepiness She has snoring, excessive daytime sleepiness, restless sleep, morning headaches. BMI 33. History of RLS. Given this,  I am concerned she could have sleep disordered breathing with obstructive sleep apnea. She will need sleep study for further evaluation.    - discussed how weight can impact sleep and risk for sleep disordered breathing - discussed options to assist with weight loss: combination of diet modification, cardiovascular and strength training  exercises   - had an extensive discussion regarding the adverse health consequences related to untreated sleep disordered breathing - specifically discussed the risks for hypertension, coronary artery disease, cardiac dysrhythmias, cerebrovascular disease, and diabetes - lifestyle modification discussed   - discussed how sleep disruption can increase risk of accidents, particularly when driving - safe driving practices were discussed  Patient Instructions  Given your symptoms, I am concerned that you may have sleep disordered breathing with sleep apnea. You will need a sleep study for further evaluation. Someone will contact you to schedule this.   We discussed how untreated sleep apnea puts an individual at risk for cardiac arrhthymias, pulm HTN, DM, stroke and increases their risk for daytime accidents. We also briefly reviewed treatment options including weight loss, side sleeping position, oral appliance, CPAP therapy or referral to ENT for possible surgical options  Use caution when driving and pull over if you become sleepy.  Referral to speech therapy for vocal cord dysfunction and swallowing practices   Follow up in 6 weeks with Katie Charina Fons,NP to go over sleep study results, or sooner, if needed. Friday PM virtual clinic preferred       Vocal cord dysfunction Difficulties with hoarse voice quality, DOE, and swallowing. Unremarkable GI workup. VCD evident on prior ENT eval. Referral to OP speech therapy  Severe asthma without complication Stable today. Lung exam clear. No exacerbations since last OV. Action plan in place.   Orthostatic dizziness Follow up with cardiology as scheduled   Advised if symptoms do not ifmprove or worsen, to please contact office for sooner follow up or seek emergency care.   I spent 35 minutes of dedicated to the care of this patient on the date of this encounter to include pre-visit review of records, face-to-face time with the patient discussing  conditions above, post visit ordering of testing, clinical documentation with the electronic health record, making appropriate referrals as documented, and communicating necessary findings to members of the patients care team.  Comer LULLA Rouleau, NP 11/07/2023  Pt aware and understands NP's role.

## 2023-11-07 NOTE — Assessment & Plan Note (Signed)
 Stable today. Lung exam clear. No exacerbations since last OV. Action plan in place.

## 2023-11-09 ENCOUNTER — Other Ambulatory Visit (HOSPITAL_BASED_OUTPATIENT_CLINIC_OR_DEPARTMENT_OTHER): Payer: Self-pay

## 2023-11-17 ENCOUNTER — Other Ambulatory Visit: Payer: Self-pay

## 2023-11-17 DIAGNOSIS — H40033 Anatomical narrow angle, bilateral: Secondary | ICD-10-CM | POA: Diagnosis not present

## 2023-11-17 DIAGNOSIS — H02411 Mechanical ptosis of right eyelid: Secondary | ICD-10-CM | POA: Diagnosis not present

## 2023-11-17 NOTE — Progress Notes (Signed)
 Specialty Pharmacy Refill Coordination Note  Dana Mccoy is a 68 y.o. female contacted today regarding refills of specialty medication(s) Dupilumab  (Dupixent )   Patient requested [name] at [facility] [place] date: 11/29/23   Medication will be filled on 09.16.25.   Next injection 9.20.25

## 2023-11-20 ENCOUNTER — Encounter (INDEPENDENT_AMBULATORY_CARE_PROVIDER_SITE_OTHER): Payer: Self-pay | Admitting: Family Medicine

## 2023-11-20 ENCOUNTER — Ambulatory Visit (INDEPENDENT_AMBULATORY_CARE_PROVIDER_SITE_OTHER): Admitting: Family Medicine

## 2023-11-20 VITALS — BP 145/75 | HR 47 | Temp 97.6°F | Ht 64.0 in | Wt 197.0 lb

## 2023-11-20 DIAGNOSIS — F5089 Other specified eating disorder: Secondary | ICD-10-CM

## 2023-11-20 DIAGNOSIS — Z6833 Body mass index (BMI) 33.0-33.9, adult: Secondary | ICD-10-CM | POA: Diagnosis not present

## 2023-11-20 DIAGNOSIS — R0602 Shortness of breath: Secondary | ICD-10-CM

## 2023-11-20 DIAGNOSIS — R7303 Prediabetes: Secondary | ICD-10-CM | POA: Diagnosis not present

## 2023-11-20 DIAGNOSIS — R5383 Other fatigue: Secondary | ICD-10-CM | POA: Diagnosis not present

## 2023-11-20 DIAGNOSIS — F39 Unspecified mood [affective] disorder: Secondary | ICD-10-CM

## 2023-11-20 DIAGNOSIS — E782 Mixed hyperlipidemia: Secondary | ICD-10-CM

## 2023-11-20 DIAGNOSIS — I10 Essential (primary) hypertension: Secondary | ICD-10-CM

## 2023-11-20 DIAGNOSIS — Z1331 Encounter for screening for depression: Secondary | ICD-10-CM

## 2023-11-20 DIAGNOSIS — E669 Obesity, unspecified: Secondary | ICD-10-CM

## 2023-11-20 DIAGNOSIS — Z7689 Persons encountering health services in other specified circumstances: Secondary | ICD-10-CM | POA: Diagnosis not present

## 2023-11-20 NOTE — Progress Notes (Signed)
 Dana Mccoy, D.O.  ABFM, ABOM Specializing in Clinical Bariatric Medicine Office located at: [address]  [place], [place]  [address]    Bariatric Medicine Visit  Dear Dana Mccoy, Dana Mccoy PARAS, MD   Thank you for referring Dana Mccoy to our clinic today for evaluation.  We performed a consultation to discuss her options for treatment and educate the patient on her disease state.  The following note includes my evaluation and treatment recommendations.   Please do not hesitate to reach out to me directly if you have any further concerns.    Assessment and Plan:   Orders Placed This Encounter  Procedures   Vitamin B12   Folate   Insulin , random   Hemoglobin A1c   Lipid panel   Magnesium   VITAMIN D  25 Hydroxy (Vit-D Deficiency, Fractures)   TSH   T4, free   Comprehensive metabolic panel with GFR   CBC with Differential/Platelet   EKG 12-Lead    FOR THE DISEASE OF OBESITY:   Morbid obesity (HCC) Starting BMI 33.8; Obesity (BMI 30-39.9) Current BMI 33.8 Assessment & Plan: Dana Mccoy is currently in the action stage of change. As such, her goal is to start our weight management plan.  She has agreed to implement the CAT 1 MP with B & L options. She was provided the following journaling parameters for breakfast as a guide: 150-200 cal and 15+ grams protein.   Behavioral Intervention We discussed the following today: different high protein breakfast ideas, alternatives to peanut butter (e.g PB2), increasing lean protein intake to established goals and focusing on food with a 10:1 ratio of calories: grams of protein.  Additional resources provided today: Handout on CAT 1 meal plan , Handout on CAT 1-2 breakfast options, Handout on CAT 1-2 lunch options, and Handout on Healthy Tuna Salad Recipe   Evidence-based interventions for health behavior change were utilized today including the discussion of self monitoring techniques, problem-solving barriers and SMART goal  setting techniques.    Goal(s) for next OV: measure intake of lean proteins and veggies    Recommended Physical Activity Goals Dana Mccoy has been advised to work up to 300-450  minutes of moderate intensity aerobic activity a week and strengthening exercises 2-3 times per week for cardiovascular health, weight loss maintenance and preservation of muscle mass.   She has agreed to : maintain current level of activity.    Pharmacotherapy Has been trialed on Phentermine by another bariatric physician while living in [place] , but this was discontinued due to high blood pressure readings.     ASSOCIATED CONDITIONS ADDRESSED TODAY:   Fatigue Assessment & Plan: Dana Mccoy does feel that her weight is causing her energy to be lower than it should be. Fatigue may be related to obesity, depression or many other causes. she does not appear to have any red flag symptoms and this appears to most likely be related to her current lifestyle habits and dietary intake.  Labs will be ordered and reviewed with her at their next office visit in two weeks.  Epworth sleepiness scale is 7 and appears to be within normal limits. Dana Mccoy admits to daytime somnolence and admits to waking up still tired. Patient has morning headaches daily. Dana Mccoy generally gets 7 hours of sleep per night, and states that her sleep is not very restful. She is unsure if she snores. Apneic episodes are not present.   ECG: Performed and reviewed/ interpreted independently.  Sinus bradycardia with Borderline LAE, rate 46 bpm,  reassuring without any acute abnormalities, will continue to monitor for symptoms     Shortness of breath on exertion Assessment & Plan: Dana Mccoy does feel that she gets out of breath more easily than she used to when she exercises and seems to be worsening over time with weight gain.  This has gotten worse recently. Dana Mccoy denies shortness of breath at rest or orthopnea. Pt denies chest pain, dizziness, heart palpitations, or  excessive diaphoresis or nausea with activity.  This is not new and is ongoing.  Dana Mccoy's shortness of breath appears to be obesity related and exercise induced, as they do not appear to have any red flag symptoms/ concerns today.  Also, this condition appears to be related to a state of poor cardiovascular conditioning   Obtain labs today and will be reviewed with her at their next office visit in two weeks.  Indirect Calorimeter completed today to help guide our dietary regimen. It shows a VO2 of 187 and a REE of 1282.  Her calculated basal metabolic rate is 8464 thus her resting energy expenditure is worse than expected.  Patient agreed to work on weight loss at this time.  As Dana Mccoy progresses through our weight loss program, we will gradually increase exercise as tolerated to treat her current condition.   If Dana Mccoy follows our recommendations and loses 5-10% of their weight without improvement of her shortness of breath or if at any time, symptoms become more concerning, they agree to urgently follow up with their PCP/ specialist for further consideration/ evaluation.   Dana Mccoy verbalizes agreement with this plan.     Mood Disorder - Emotional Eating; Depression Screen.   Assessment & Plan: She has a history of mood disorder with more anxiety than depression. Her PHQ-9 score was 6 today. Denies SI/HI. On Prozac  20 mg daily and feels her mood is well controlled. She admits to emotional eating tendencies when stressed or angry. She desires to work on her prudent nutritional plan and mindful eating at this time. She is open to seeing our  bariatric psychologist in the future if needed.     Prediabetes Assessment & Plan: She has a long history of Pre-DM. States her Hgba1c was checked in 2025 and was either 5.7 or 5.8. She has previously tried Metformin  but states it was discontinued due to GI upset. She will begin balanced diet focusing on lean protein, fruits, and vegetables while limiting simple  carbohydrates. Check labs today.     Primary hypertension Assessment & Plan: BP Readings from Last 3 Encounters:  11/20/23 (!) 145/75  11/07/23 129/78  10/11/23 110/78   On hydrochlorothiazide  12.5 mg daily and Losartan  25 mg daily with reported good compliance and tolerance. Blood pressure at goal today. She mentions being seen in the ED for chest pain while living in [place] ; she reports that the work up done at that time was negative. Of note, Treadmill stress test done 03/2018 was unremarkable. CT coronary scan dated 12/09/2019 demonstrated a coronary calcium  score of 0.  She will continue all medications and begin implementation of her low sodium diet. F/up with cards/PCP as directed.    Mixed hyperlipidemia Assessment & Plan: Lab Results  Component Value Date   CHOL 168 07/19/2021   HDL 52 07/19/2021   LDLCALC 100 (H) 07/19/2021   LDLDIRECT 150.0 09/06/2019   TRIG 83 07/19/2021   CHOLHDL 5 09/06/2019   She did not tolerate Lipitor in the past because of cognitive side effects. She is currently doing well  on Crestor  5 mg daily. She will cont statin therapy and begin implementation of her heart-heathy, low cholesterol meal plan. She was advised to limit her red meat intake to no more than two days a week. Check lipid panel today.    FOLLOW UP:   Follow up in 2 weeks. She was informed of the importance of frequent follow up visits to maximize her success with intensive lifestyle modifications for her multiple health conditions.  Dana Mccoy is aware that we will review all of her lab results at our next visit.  She is aware that if anything is critical/ life threatening with the results, we will be contacting her via MyChart prior to the office visit to discuss management.     Chief Complaint:   OBESITY Dana Mccoy (MR# [id number]) is a pleasant 69 y.o. female who presents for evaluation and treatment of obesity and related comorbidities. Current BMI is Body mass  index is 33.81 kg/m. Dana Mccoy has been struggling with her weight for many years and has been unsuccessful in either losing weight, maintaining weight loss, or reaching her healthy weight goal.  Dana Mccoy is currently in the action stage of change and ready to dedicate time achieving and maintaining a healthier weight. Dana Mccoy is interested in becoming our patient and working on intensive lifestyle modifications including (but not limited to) diet and exercise for weight loss.  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 Patient is single and has 2 grown children. She lives alone.  Returning patient, last seen by Corean Scala NP on 11/04/2021. In the interim, she moved to [place]  and was followed by a physciain while living there.   Heaviest weight 217 LBS  Started gaining weight after her divorce and when she quit smoking.  Desires to be 148 lbs in 9 months to increase energy levels so she can play with her grandkids and hopefully discontinue some medications she's on.   Exercise: walks 15 minutes 4x daily, occasionally uses bike or rowing machine. Wears a fitness tracker and walks 5000-9000 steps daily.   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   Occasionally skips lunch.  Snacks on rice cakes with peanut butter, fruits, and almonds.  Craves salty foods like tortilla chips with salsa - these cravings are usually in the evenings.   She is not a picky eater, but dislikes eating meat daily.   Does not drink soda, but does consume milk, sweet tea with sugar, protein smoothies, and protein drinks.   Worst food habit: Eating too much peanut butter    Subjective:   This is the patient's first visit at [facility].  The patient's NEW PATIENT PACKET that they filled out prior to today's office visit was reviewed at length  and information from that paperwork was included within the following office visit note.    Included in the packet: current and past health history, medications, allergies, ROS, gynecologic history (women only), surgical history, family history, social history, weight history, weight loss surgery history (for those that have had weight loss surgery), nutritional evaluation, mood and food questionnaire along with a depression screening (PHQ9) on all patients, an Epworth questionnaire, sleep habits questionnaire, patient life and health improvement goals questionnaire. These will all be scanned into the patient's chart under the media tab.   Review of Systems: Please refer  to new patient packet scanned into media. Pertinent positives were addressed with patient today.  Reviewed by clinician on day of visit: allergies, medications, problem list, medical history, surgical history, family history, social history, and previous encounter notes.  During the visit, I independently reviewed the patient's EKG, bioimpedance scale results, and indirect calorimeter results. I used this information to tailor a meal plan for the patient that will help Dana Mccoy to lose weight and will improve her obesity-related conditions going forward.  I performed a medically necessary appropriate examination and/or evaluation. I discussed the assessment and treatment plan with the patient. The patient was provided an opportunity to ask questions and all were answered. The patient agreed with the plan and demonstrated an understanding of the instructions. Labs were ordered today (unless patient declined them) and will be reviewed with the patient at our next visit unless more critical results need to be addressed immediately. Clinical information was updated and documented in the EMR.    Objective:   PHYSICAL EXAM: Blood pressure (!) 145/75, pulse (!) 47, temperature 97.6 F (36.4 C), height 5' 4 (1.626 m), weight 197  lb (89.4 kg), SpO2 96%. Body mass index is 33.81 kg/m.  General: Well Developed, well nourished, and in no acute distress.  HEENT: Normocephalic, atraumatic; EOMI, sclerae are anicteric. Skin: Warm and dry, good turgor Chest:  Normal excursion, shape, no gross ABN Respiratory: No conversational dyspnea; speaking in full sentences NeuroM-Sk:  Normal gross ROM * 4 extremities  Psych: A and O *3, insight adequate, mood- full   Anthropometric Measurements Height: 5' 4 (1.626 m) Weight: 197 lb (89.4 kg) BMI (Calculated): 33.8 Weight at Last Visit: NA Weight Lost Since Last Visit: NA Weight Gained Since Last Visit: NA Starting Weight: 197lb Total Weight Loss (lbs): 0 lb (0 kg) Peak Weight: 217lb Waist Measurement : 40 inches   Body Composition  Body Fat %: 43.8 % Fat Mass (lbs): 86.2 lbs Muscle Mass (lbs): 105.2 lbs Total Body Water (lbs): 76.2 lbs Visceral Fat Rating : 13   Other Clinical Data RMR: 1282 Fasting: Yes Labs: Yes Today's Visit #: 1 Starting Date: 11/20/23 Comments: New Patient    DIAGNOSTIC DATA REVIEWED:  BMET    Component Value Date/Time   NA 145 (H) 07/22/2021 1608   K 3.8 07/22/2021 1608   CL 104 07/22/2021 1608   CO2 25 07/22/2021 1608   GLUCOSE 79 07/22/2021 1608   GLUCOSE 81 11/18/2020 1557   GLUCOSE 77 01/24/2006 1211   BUN 18 07/22/2021 1608   CREATININE 0.79 07/22/2021 1608   CALCIUM  10.1 07/22/2021 1608   GFRNONAA >60 11/18/2020 1557   GFRAA >60 11/10/2019 0032   Lab Results  Component Value Date   HGBA1C 5.7 (H) 07/19/2021   HGBA1C 5.7 01/24/2006   Lab Results  Component Value Date   INSULIN  11.4 04/20/2021   Lab Results  Component Value Date   TSH 4.270 04/20/2021   CBC    Component Value Date/Time   WBC 6.4 04/20/2021 0922   WBC 6.0 01/21/2021 1154   RBC 4.92 04/20/2021 0922   RBC 4.74 01/21/2021 1154   HGB 13.8 04/20/2021 0922   HCT 42.4 04/20/2021 0922   PLT 278 04/20/2021 0922   MCV 86 04/20/2021 0922   MCH  28.0 04/20/2021 0922   MCH 27.8 11/18/2020 1557   MCHC 32.5 04/20/2021 0922   MCHC 31.9 01/21/2021 1154   RDW 12.5 04/20/2021 9077   Iron Studies    Component Value Date/Time  IRON 62 09/06/2019 1420   TIBC 401 09/06/2019 1420   FERRITIN 16 09/06/2019 1420   IRONPCTSAT 15 (L) 09/06/2019 1420   Lipid Panel     Component Value Date/Time   CHOL 168 07/19/2021 1429   CHOL 260 (H) 09/05/2014 1026   TRIG 83 07/19/2021 1429   TRIG 161 (H) 09/05/2014 1026   HDL 52 07/19/2021 1429   HDL 59 09/05/2014 1026   CHOLHDL 5 09/06/2019 1420   VLDL 47.0 (H) 09/06/2019 1420   LDLCALC 100 (H) 07/19/2021 1429   LDLCALC 169 (H) 09/05/2014 1026   LDLDIRECT 150.0 09/06/2019 1420   Hepatic Function Panel     Component Value Date/Time   PROT 6.6 07/22/2021 1608   ALBUMIN 4.4 07/22/2021 1608   AST 26 07/22/2021 1608   ALT 18 07/22/2021 1608   ALKPHOS 115 07/22/2021 1608   BILITOT 0.3 07/22/2021 1608   BILIDIR 0.0 09/05/2014 1026      Component Value Date/Time   TSH 4.270 04/20/2021 9077   Nutritional Lab Results  Component Value Date   VD25OH 76.8 07/19/2021   VD25OH 29.2 (L) 04/20/2021     Attestation Statements:   I, Special Puri, acting as a Stage manager for Dana Jenkins, DO., have compiled all relevant documentation for today's office visit on behalf of Dana Jenkins, DO, while in the presence of [employer], DO.  I have spent 45 minutes in the care of the patient today including 32 minutes face-to-face assessing and reviewing listed medical problems above as outlined in office visit note, providing nutritional and behavioral counseling as outlined in obesity care plan, independently interpreting results and goals of care, see listed medical problems, and discussing biometric information and progress.   I have reviewed the above documentation for accuracy and completeness, and I agree with the above. Dana JINNY Mccoy, D.O.  The 21st Century Cures Act was signed into  law in 2016 which includes the topic of electronic health records.  This provides immediate access to information in MyChart.  This includes consultation notes, operative notes, office notes, lab results and pathology reports.  If you have any questions about what you read please let us  know at your next visit so we can discuss your concerns and take corrective action if need be.  We are right here with you.

## 2023-11-21 ENCOUNTER — Other Ambulatory Visit (HOSPITAL_BASED_OUTPATIENT_CLINIC_OR_DEPARTMENT_OTHER): Payer: Self-pay

## 2023-11-21 DIAGNOSIS — H40031 Anatomical narrow angle, right eye: Secondary | ICD-10-CM | POA: Diagnosis not present

## 2023-11-21 DIAGNOSIS — H40033 Anatomical narrow angle, bilateral: Secondary | ICD-10-CM | POA: Diagnosis not present

## 2023-11-21 LAB — CBC WITH DIFFERENTIAL/PLATELET
Basophils Absolute: 0 x10E3/uL (ref 0.0–0.2)
Basos: 1 %
EOS (ABSOLUTE): 0.1 x10E3/uL (ref 0.0–0.4)
Eos: 2 %
Hematocrit: 43.1 % (ref 34.0–46.6)
Hemoglobin: 13.3 g/dL (ref 11.1–15.9)
Immature Grans (Abs): 0 x10E3/uL (ref 0.0–0.1)
Immature Granulocytes: 0 %
Lymphocytes Absolute: 1.2 x10E3/uL (ref 0.7–3.1)
Lymphs: 17 %
MCH: 26.2 pg — ABNORMAL LOW (ref 26.6–33.0)
MCHC: 30.9 g/dL — ABNORMAL LOW (ref 31.5–35.7)
MCV: 85 fL (ref 79–97)
Monocytes Absolute: 0.5 x10E3/uL (ref 0.1–0.9)
Monocytes: 8 %
Neutrophils Absolute: 5 x10E3/uL (ref 1.4–7.0)
Neutrophils: 72 %
Platelets: 297 x10E3/uL (ref 150–450)
RBC: 5.07 x10E6/uL (ref 3.77–5.28)
RDW: 14.1 % (ref 11.7–15.4)
WBC: 6.9 x10E3/uL (ref 3.4–10.8)

## 2023-11-21 LAB — COMPREHENSIVE METABOLIC PANEL WITH GFR
ALT: 11 IU/L (ref 0–32)
AST: 22 IU/L (ref 0–40)
Albumin: 4.5 g/dL (ref 3.9–4.9)
Alkaline Phosphatase: 128 IU/L — ABNORMAL HIGH (ref 44–121)
BUN/Creatinine Ratio: 23 (ref 12–28)
BUN: 19 mg/dL (ref 8–27)
Bilirubin Total: 0.5 mg/dL (ref 0.0–1.2)
CO2: 24 mmol/L (ref 20–29)
Calcium: 10.1 mg/dL (ref 8.7–10.3)
Chloride: 101 mmol/L (ref 96–106)
Creatinine, Ser: 0.81 mg/dL (ref 0.57–1.00)
Globulin, Total: 2.5 g/dL (ref 1.5–4.5)
Glucose: 76 mg/dL (ref 70–99)
Potassium: 4.3 mmol/L (ref 3.5–5.2)
Sodium: 141 mmol/L (ref 134–144)
Total Protein: 7 g/dL (ref 6.0–8.5)
eGFR: 79 mL/min/1.73 (ref >59)

## 2023-11-21 LAB — HEMOGLOBIN A1C
Est. average glucose Bld gHb Est-mCnc: 120 mg/dL
Hgb A1c MFr Bld: 5.8 % — ABNORMAL HIGH (ref 4.8–5.6)

## 2023-11-21 LAB — LIPID PANEL
Chol/HDL Ratio: 3.2 ratio (ref 0.0–4.4)
Cholesterol, Total: 196 mg/dL (ref 100–199)
HDL: 61 mg/dL (ref >39)
LDL Chol Calc (NIH): 101 mg/dL — ABNORMAL HIGH (ref 0–99)
Triglycerides: 198 mg/dL — ABNORMAL HIGH (ref 0–149)
VLDL Cholesterol Cal: 34 mg/dL (ref 5–40)

## 2023-11-21 LAB — FOLATE: Folate: >20 ng/mL (ref >3.0)

## 2023-11-21 LAB — INSULIN, RANDOM: INSULIN: 13.5 u[IU]/mL (ref 2.6–24.9)

## 2023-11-21 LAB — VITAMIN B12: Vitamin B-12: 1292 pg/mL — ABNORMAL HIGH (ref 232–1245)

## 2023-11-21 LAB — T4, FREE: Free T4: 0.79 ng/dL — ABNORMAL LOW (ref 0.82–1.77)

## 2023-11-21 LAB — MAGNESIUM: Magnesium: 2.1 mg/dL (ref 1.6–2.3)

## 2023-11-21 LAB — TSH: TSH: 3.87 u[IU]/mL (ref 0.450–4.500)

## 2023-11-21 LAB — VITAMIN D 25 HYDROXY (VIT D DEFICIENCY, FRACTURES): Vit D, 25-Hydroxy: 26.9 ng/mL — ABNORMAL LOW (ref 30.0–100.0)

## 2023-11-21 MED ORDER — PREDNISOLONE ACETATE 1 % OP SUSP
1.0000 [drp] | Freq: Four times a day (QID) | OPHTHALMIC | 0 refills | Status: AC
  Filled 2023-11-21: qty 5, 4d supply, fill #0

## 2023-11-28 ENCOUNTER — Other Ambulatory Visit: Payer: Self-pay

## 2023-11-28 ENCOUNTER — Encounter: Payer: Self-pay | Admitting: Internal Medicine

## 2023-11-28 DIAGNOSIS — E063 Autoimmune thyroiditis: Secondary | ICD-10-CM

## 2023-11-30 ENCOUNTER — Ambulatory Visit: Admitting: Family Medicine

## 2023-11-30 ENCOUNTER — Other Ambulatory Visit: Payer: Self-pay

## 2023-11-30 ENCOUNTER — Other Ambulatory Visit (HOSPITAL_BASED_OUTPATIENT_CLINIC_OR_DEPARTMENT_OTHER): Payer: Self-pay

## 2023-11-30 MED ORDER — LEVOTHYROXINE SODIUM 25 MCG PO TABS
ORAL_TABLET | ORAL | 2 refills | Status: AC
  Filled 2023-11-30: qty 120, 90d supply, fill #0

## 2023-12-01 ENCOUNTER — Ambulatory Visit: Admitting: Nurse Practitioner

## 2023-12-01 ENCOUNTER — Ambulatory Visit

## 2023-12-01 ENCOUNTER — Other Ambulatory Visit (HOSPITAL_BASED_OUTPATIENT_CLINIC_OR_DEPARTMENT_OTHER): Payer: Self-pay

## 2023-12-01 VITALS — BP 118/80 | HR 72 | Temp 98.2°F | Ht 64.0 in | Wt 199.1 lb

## 2023-12-01 DIAGNOSIS — J069 Acute upper respiratory infection, unspecified: Secondary | ICD-10-CM

## 2023-12-01 LAB — POCT INFLUENZA A/B
Influenza A, POC: NEGATIVE
Influenza B, POC: NEGATIVE

## 2023-12-01 LAB — POC COVID19 BINAXNOW: SARS Coronavirus 2 Ag: NEGATIVE

## 2023-12-01 LAB — POCT RESPIRATORY SYNCYTIAL VIRUS: RSV Rapid Ag: NEGATIVE

## 2023-12-01 MED ORDER — PROMETHAZINE-DM 6.25-15 MG/5ML PO SYRP
5.0000 mL | ORAL_SOLUTION | Freq: Four times a day (QID) | ORAL | 0 refills | Status: DC | PRN
  Filled 2023-12-01: qty 118, 6d supply, fill #0

## 2023-12-01 MED ORDER — PREDNISONE 20 MG PO TABS
40.0000 mg | ORAL_TABLET | Freq: Every day | ORAL | 0 refills | Status: DC
  Filled 2023-12-01: qty 10, 5d supply, fill #0

## 2023-12-01 MED ORDER — AZITHROMYCIN 250 MG PO TABS
ORAL_TABLET | ORAL | 0 refills | Status: AC
  Filled 2023-12-01: qty 6, 5d supply, fill #0

## 2023-12-01 NOTE — Progress Notes (Signed)
 Established Patient Office Visit  Subjective   Patient ID: Dana Mccoy, female    DOB: [birth year]  Age: 68 y.o. MRN: [id number]  Chief Complaint  Patient presents with   Cough    Discussed the use of AI scribe software for clinical note transcription with the patient, who gave verbal consent to proceed.  History of Present Illness Dana Mccoy is a 68 year old female with severe asthma who presents with worsening respiratory symptoms and fatigue.  Respiratory symptoms - Worsening cough, especially at night, disrupting sleep - Shortness of breath with reduced activity tolerance - No chest pain or palpitations - History of severe asthma managed with Air Supra, albuterol , Advair , Singulair , Spiriva , and Dupixent  - Prone to sinus infections that often progress to bronchitis - No inhaler use today; used inhalers yesterday       Review of Systems  Constitutional:  Positive for chills and malaise/fatigue. Negative for fever.  Respiratory:  Positive for cough, shortness of breath and wheezing.   Cardiovascular:  Negative for chest pain and palpitations.  Gastrointestinal:  Negative for diarrhea, nausea and vomiting.      Objective:     BP 118/80   Pulse 72   Temp 98.2 F (36.8 C) (Temporal)   Ht 5' 4 (1.626 m)   Wt 199 lb 2 oz (90.3 kg)   SpO2 96%   BMI 34.18 kg/m  BP Readings from Last 3 Encounters:  12/01/23 118/80  11/20/23 (!) 145/75  11/07/23 129/78   Wt Readings from Last 3 Encounters:  12/01/23 199 lb 2 oz (90.3 kg)  11/20/23 197 lb (89.4 kg)  11/07/23 200 lb 3.2 oz (90.8 kg)      Physical Exam Vitals reviewed.  Constitutional:      General: She is not in acute distress.    Appearance: Normal appearance.  HENT:     Head: Normocephalic and atraumatic.  Neck:     Vascular: No carotid bruit.  Cardiovascular:     Rate and Rhythm: Normal rate and regular rhythm.     Pulses: Normal pulses.     Heart sounds: Normal heart sounds.   Pulmonary:     Effort: Pulmonary effort is normal.     Breath sounds: Normal breath sounds.  Skin:    General: Skin is warm and dry.  Neurological:     General: No focal deficit present.     Mental Status: She is alert and oriented to person, place, and time.  Psychiatric:        Mood and Affect: Mood normal.        Behavior: Behavior normal.        Judgment: Judgment normal.      No results found for any visits on 12/01/23.    The 10-year ASCVD risk score (Arnett DK, et al., 2019) is: 8.4%    Assessment & Plan:   Problem List Items Addressed This Visit       Respiratory   Upper respiratory tract infection - Primary   Acute viral upper respiratory infection Negative for flu, COVID, and RSV. Likely viral and self-limiting. Secondary bacterial infection possible if symptoms persist beyond 10-14 days. - Prescribe Z-Pak (azithromycin ) if symptoms persist beyond 10-14 days. - Advise to wait 10-14 days before starting antibiotic unless symptoms worsen. - Discuss importance of not using antibiotics prematurely. - Prescribe promethazine  with dextromethorphan for nighttime cough relief.  Asthma exacerbation Increased shortness of breath and cough. No chest pain, palpitations, or wheezing. - Prescribe  prednisone  40 mg daily for 5 days. Take with food, separate from aspirin. - Discuss risks of prednisone  including gastrointestinal bleeding. Advise Tylenol for headache, avoid NSAIDs. - Send prednisone  prescription to [facility].      Relevant Medications   predniSONE  (DELTASONE ) 20 MG tablet   promethazine -dextromethorphan (PROMETHAZINE -DM) 6.25-15 MG/5ML syrup   azithromycin  (ZITHROMAX ) 250 MG tablet   Assessment and Plan Assessment & Plan Acute viral upper respiratory infection Negative for flu, COVID, and RSV. Likely viral and self-limiting. Secondary bacterial infection possible if symptoms persist beyond 10-14 days. - Prescribe Z-Pak (azithromycin ) if symptoms  persist beyond 10-14 days. - Advise to wait 10-14 days before starting antibiotic unless symptoms worsen. - Discuss importance of not using antibiotics prematurely. - Prescribe promethazine  with dextromethorphan for nighttime cough relief.  Asthma exacerbation Increased shortness of breath and cough. No chest pain, palpitations, or wheezing. - Prescribe prednisone  40 mg daily for 5 days. Take with food, separate from aspirin. - Discuss risks of prednisone  including gastrointestinal bleeding. Advise Tylenol for headache, avoid NSAIDs. - Send prednisone  prescription to [facility].    Return if symptoms worsen or fail to improve.    Dana FORBES Pereyra, NP

## 2023-12-01 NOTE — Assessment & Plan Note (Signed)
 Acute viral upper respiratory infection Negative for flu, COVID, and RSV. Likely viral and self-limiting. Secondary bacterial infection possible if symptoms persist beyond 10-14 days. - Prescribe Z-Pak (azithromycin ) if symptoms persist beyond 10-14 days. - Advise to wait 10-14 days before starting antibiotic unless symptoms worsen. - Discuss importance of not using antibiotics prematurely. - Prescribe promethazine  with dextromethorphan for nighttime cough relief.  Asthma exacerbation Increased shortness of breath and cough. No chest pain, palpitations, or wheezing. - Prescribe prednisone  40 mg daily for 5 days. Take with food, separate from aspirin. - Discuss risks of prednisone  including gastrointestinal bleeding. Advise Tylenol for headache, avoid NSAIDs. - Send prednisone  prescription to [facility].

## 2023-12-01 NOTE — Addendum Note (Signed)
 Addended by: LEAR, Adley Mazurowski P on: 12/01/2023 05:06 PM   Modules accepted: Orders

## 2023-12-04 ENCOUNTER — Other Ambulatory Visit (HOSPITAL_BASED_OUTPATIENT_CLINIC_OR_DEPARTMENT_OTHER): Payer: Self-pay

## 2023-12-04 ENCOUNTER — Encounter (INDEPENDENT_AMBULATORY_CARE_PROVIDER_SITE_OTHER): Payer: Self-pay | Admitting: Family Medicine

## 2023-12-04 ENCOUNTER — Other Ambulatory Visit: Payer: Self-pay

## 2023-12-04 ENCOUNTER — Ambulatory Visit (INDEPENDENT_AMBULATORY_CARE_PROVIDER_SITE_OTHER): Admitting: Family Medicine

## 2023-12-04 DIAGNOSIS — E782 Mixed hyperlipidemia: Secondary | ICD-10-CM

## 2023-12-04 DIAGNOSIS — Z6833 Body mass index (BMI) 33.0-33.9, adult: Secondary | ICD-10-CM | POA: Diagnosis not present

## 2023-12-04 DIAGNOSIS — I10 Essential (primary) hypertension: Secondary | ICD-10-CM

## 2023-12-04 DIAGNOSIS — E669 Obesity, unspecified: Secondary | ICD-10-CM

## 2023-12-04 DIAGNOSIS — E559 Vitamin D deficiency, unspecified: Secondary | ICD-10-CM

## 2023-12-04 DIAGNOSIS — E063 Autoimmune thyroiditis: Secondary | ICD-10-CM

## 2023-12-04 DIAGNOSIS — R7303 Prediabetes: Secondary | ICD-10-CM

## 2023-12-04 MED ORDER — VITAMIN D (ERGOCALCIFEROL) 1.25 MG (50000 UNIT) PO CAPS
50000.0000 [IU] | ORAL_CAPSULE | ORAL | 0 refills | Status: DC
  Filled 2023-12-04 (×2): qty 4, 28d supply, fill #0

## 2023-12-04 NOTE — Progress Notes (Signed)
 Dana DOROTHA Mccoy, D.O.  ABFM, ABOM Clinical Bariatric Medicine Physician  Office located at: [address] [place], [place]  [address]   Assessment and Plan:   Meds ordered this encounter  Medications   Vitamin D , Ergocalciferol , (DRISDOL ) 1.25 MG (50000 UNIT) CAPS capsule    Sig: Take 1 capsule (50,000 Units total) by mouth every 7 (seven) days.    Dispense:  4 capsule    Refill:  0     FOR THE DISEASE OF OBESITY:  Obesity (BMI 30-39.9) Current BMI 33.28 Morbid obesity (HCC) Starting BMI 33.8 Assessment & Plan:  11/20/23 12/04/23 11:00   Body Fat % 43.8 % 42.7 %  Muscle Mass (lbs) 105.2 lbs 106 lbs  Fat Mass (lbs) 86.2 lbs 83 lbs  Total Body Water (lbs) 76.2 lbs 74.4 lbs  Visceral Fat Rating  13 13    Total lbs lost to date: -3  lbs Total weight loss percentage to date: -1.52 %   Recommended Dietary Goals Dana Mccoy is currently in the action stage of change. As such, her goal is to continue weight management plan.  She has agreed to: continue current plan   Behavioral Intervention We discussed the following today:low calorie salad dressings, hidden calories, having multiple small meals a day to get in all her meal plan foods, increasing water intake, high protein snack ideas,  increasing lean protein intake to established goals and decreasing simple carbohydrates   Additional resources provided today: Handout on protein equivalents of 2 ounces of meat or seafood and Handout on Healthy [employer] Recipe   Evidence-based interventions for health behavior change were utilized today including the discussion of self monitoring techniques, problem-solving barriers and SMART goal setting techniques.  Regarding patient's less desirable eating habits and patterns, we employed the technique of small changes.    Goal(s) for next OV: measure and increase protein intake   Recommended Physical Activity Goals Dana Mccoy has been advised to work up to 300-450 minutes of moderate  intensity aerobic activity a week and strengthening exercises 2-3 times per week for cardiovascular health, weight loss maintenance and preservation of muscle mass.   She may Continue to gradually increase the amount and intensity of exercise routine   Pharmacotherapy Continue with current nutritional and behavioral strategies   ASSOCIATED CONDITIONS ADDRESSED TODAY:   Prediabetes Assessment & Plan: Lab Results  Component Value Date   HGBA1C 5.8 (H) 11/20/2023   HGBA1C 5.7 (H) 07/19/2021   HGBA1C 5.7 (H) 04/20/2021   INSULIN  13.5 11/20/2023   INSULIN  11.4 04/20/2021   Lab Results  Component Value Date   CREATININE 0.81 11/20/2023   BUN 19 11/20/2023   NA 141 11/20/2023   K 4.3 11/20/2023   CL 101 11/20/2023   CO2 24 11/20/2023      Component Value Date/Time   PROT 7.0 11/20/2023 1057   ALBUMIN 4.5 11/20/2023 1057   AST 22 11/20/2023 1057   ALT 11 11/20/2023 1057   ALKPHOS 128 (H) 11/20/2023 1057   BILITOT 0.5 11/20/2023 1057   BILIDIR 0.0 09/05/2014 1026   Lab Results  Component Value Date   WBC 6.9 11/20/2023   HGB 13.3 11/20/2023   HCT 43.1 11/20/2023   MCV 85 11/20/2023   PLT 297 11/20/2023   Lab Results  Component Value Date   VITAMINB12 1,292 (H) 11/20/2023   FOLATE >20.0 11/20/2023   Diet/lifestyle approach. Hunger and cravings are stable when following her prudent nutritional plan. Was previously on Metformin  but states it  was discontinued due to GI upset. Reviewed labs with pt. A1c worsened from 5.7 to 5.8. Fasting insulin  is elevated at 13.5; optimal level <5. Kidney function, electrolytes, blood counts are WNL. Alkaline phosphatase was elevated at 128 which is likely related to her Vit D deficiency. Folate and magnesium at goal. No concerns with B12 level (not on B12 supplementation; she does add one small celsius powder stick to her water each day.  Patient aware of disease state and risk of progression. Explained role of simple carbs and insulin   levels on hunger and cravings and blood sugar levels Educated patient that having adequate amounts of protein with each meal is important for increasing muscle mass, stabilizing sugars, controlling hunger and cravings, and improving thermogenesis. Continue working on nutrition plan to decrease simple carbohydrates, increase lean proteins and exercise to promote weight loss and improve glycemic control and prevent progression to T2DM.     Hypothyroidism due to Hashimoto thyroiditis Assessment & Plan: Lab Results  Component Value Date   TSH 3.870 11/20/2023   FREET4 0.79 (L) 11/20/2023   TSH is at goal. Free T4 was slightly below goal at 0.79. Pt has been feeling tired and sluggish. She contacted her PCP and her Levothyroxine  dose was increased from 25 mcg daily to 50 mcg three times a week and 25 mcg four times a week around 11/28/2023. Cont regimen per PCP. Recheck labs in 6-8 weeks with primary care or at our clinic.     Primary hypertension Assessment & Plan: Last 3 blood pressure readings in our office are as follows: BP Readings from Last 3 Encounters:  12/04/23 113/68  12/01/23 118/80  11/20/23 (!) 145/75   BP at goal today. Cont hydrochlorothiazide  12.5 mg daily and Losartan  25 mg daily. Cont low sodium diet. F/up with cards/PCP as directed.    Mixed hyperlipidemia Assessment & Plan: Lab Results  Component Value Date   CHOL 196 11/20/2023   HDL 61 11/20/2023   LDLCALC 101 (H) 11/20/2023   LDLDIRECT 150.0 09/06/2019   TRIG 198 (H) 11/20/2023   CHOLHDL 3.2 11/20/2023   She did not tolerate Lipitor in the past because of cognitive side effects. She is currently doing well on Crestor  5 mg daily. Reviewed labs with pt. Triglycerides worsened from 83 to 198. LDL is mildly elevated at 898; optimal level <100. HDL is at goal. Maintain with Crestor ; Cont with our prudent nutritional plan that is low in saturated and trans fats, and low in fatty carbs to improve these numbers.      Vitamin D  deficiency Assessment & Plan: Lab Results  Component Value Date   VD25OH 26.9 (L) 11/20/2023   States she had a bone density scan earlier this year while she was living in [place] ; pt states it showed osteopenia. Reviewed Vit D levels with pt; they are not at goal of 50 to 70. She is currently taking Viactive daily which contains calcium , Vit D, and vitamin K. Discussed the importance of vitamin D  to the patient's health and well-being as well as to their ability to lose weight. After discussion of benefits, alternative treatment options and side effects patient will be started on once weekly prescription strength Vit D. Recheck levels: 3-4 months.      FOLLOW UP:   Return 12/28/2023 at 11:40 AM. She was informed of the importance of frequent follow up visits to maximize her success with intensive lifestyle modifications for her multiple health conditions.   Subjective:   Chief complaint: Obesity Dana Mccoy  is here to discuss her progress with her obesity treatment plan. She is on the CAT 1 MP with B & L options. She also has the option to journal 150-200 calories and 15+ grams protein for breakfast and states she is following her eating plan approximately 80 % of the time. She is walking 25 minutes 5-7 days per week.    Interval History:  Dana Mccoy is here today for her first follow-up office visit since starting the program with us . Patient is off to a good start and has lost 3 lbs since LOV on 11/20/2023. She was out of town for the first week and tried to make healthier choices while traveling. The second week, she returned home but got sick. Nevertheless, she focused  on getting greens through salads and increasing her lean protein intake.    Additional dietary and lifestyle habits:  - Tracking Calories/Macros: no  - Eating More Whole Foods: yes  - Adequate Protein Intake: yes  - Adequate Water Intake: no; drinks 7 cups of water daily.  - Skipping Meals:  no  - Sleeping 7-9 Hours/ Night: no, likely because of recent travels   Pharmacotherapy for weight loss: n/a   Review of Systems:  Pertinent positives were addressed with patient today.   Reviewed by clinician on day of visit: allergies, medications, problem list, medical history, surgical history, family history, social history, and previous encounter notes.  Weight Summary and Biometrics   Weight Lost Since Last Visit: 3lb  Weight Gained Since Last Visit: 0lb   Vitals Temp: 98.2 F (36.8 C) BP: 113/68 Pulse Rate: 85 SpO2: 97 %   Anthropometric Measurements Height: 5' 4 (1.626 m) Weight: 194 lb (88 kg) BMI (Calculated): 33.28 Weight at Last Visit: 197lb Weight Lost Since Last Visit: 3lb Weight Gained Since Last Visit: 0lb Starting Weight: 197lb Total Weight Loss (lbs): 3 lb (1.361 kg) Peak Weight: 271lb   Body Composition  Body Fat %: 42.7 % Fat Mass (lbs): 83 lbs Muscle Mass (lbs): 106 lbs Total Body Water (lbs): 74.4 lbs Visceral Fat Rating : 13   Other Clinical Data Fasting: no Labs: no Today's Visit #: 2 Starting Date: 11/20/23 Comments: NP FU    Objective:   PHYSICAL EXAM:  Blood pressure 113/68, pulse 85, temperature 98.2 F (36.8 C), height 5' 4 (1.626 m), weight 194 lb (88 kg), SpO2 97%. Body mass index is 33.3 kg/m.  General: she is overweight, cooperative and in no acute distress.   HEENT: EOMI, sclerae are anicteric. Lungs: Normal breathing effort, no conversational dyspnea. M-Sk:  Normal gross ROM * 4 extremities  PSYCH: Has normal mood, affect and thought process. Neurologic: No gross sensory or motor deficits. Well developed, A and O * 3  DIAGNOSTIC DATA REVIEWED:  BMET    Component Value Date/Time   NA 141 11/20/2023 1057   K 4.3 11/20/2023 1057   CL 101 11/20/2023 1057   CO2 24 11/20/2023 1057   GLUCOSE 76 11/20/2023 1057   GLUCOSE 81 11/18/2020 1557   GLUCOSE 77 01/24/2006 1211   BUN 19 11/20/2023 1057    CREATININE 0.81 11/20/2023 1057   CALCIUM  10.1 11/20/2023 1057   GFRNONAA >60 11/18/2020 1557   GFRAA >60 11/10/2019 0032   Lab Results  Component Value Date   HGBA1C 5.8 (H) 11/20/2023   HGBA1C 5.7 01/24/2006   Lab Results  Component Value Date   INSULIN  13.5 11/20/2023   INSULIN  11.4 04/20/2021   Lab Results  Component Value Date  TSH 3.870 11/20/2023   CBC    Component Value Date/Time   WBC 6.9 11/20/2023 1057   WBC 6.0 01/21/2021 1154   RBC 5.07 11/20/2023 1057   RBC 4.74 01/21/2021 1154   HGB 13.3 11/20/2023 1057   HCT 43.1 11/20/2023 1057   PLT 297 11/20/2023 1057   MCV 85 11/20/2023 1057   MCH 26.2 (L) 11/20/2023 1057   MCH 27.8 11/18/2020 1557   MCHC 30.9 (L) 11/20/2023 1057   MCHC 31.9 01/21/2021 1154   RDW 14.1 11/20/2023 1057   Iron Studies    Component Value Date/Time   IRON 62 09/06/2019 1420   TIBC 401 09/06/2019 1420   FERRITIN 16 09/06/2019 1420   IRONPCTSAT 15 (L) 09/06/2019 1420   Lipid Panel     Component Value Date/Time   CHOL 196 11/20/2023 1057   CHOL 260 (H) 09/05/2014 1026   TRIG 198 (H) 11/20/2023 1057   TRIG 161 (H) 09/05/2014 1026   HDL 61 11/20/2023 1057   HDL 59 09/05/2014 1026   CHOLHDL 3.2 11/20/2023 1057   CHOLHDL 5 09/06/2019 1420   VLDL 47.0 (H) 09/06/2019 1420   LDLCALC 101 (H) 11/20/2023 1057   LDLCALC 169 (H) 09/05/2014 1026   LDLDIRECT 150.0 09/06/2019 1420   Hepatic Function Panel     Component Value Date/Time   PROT 7.0 11/20/2023 1057   ALBUMIN 4.5 11/20/2023 1057   AST 22 11/20/2023 1057   ALT 11 11/20/2023 1057   ALKPHOS 128 (H) 11/20/2023 1057   BILITOT 0.5 11/20/2023 1057   BILIDIR 0.0 09/05/2014 1026      Component Value Date/Time   TSH 3.870 11/20/2023 1057   Nutritional Lab Results  Component Value Date   VD25OH 26.9 (L) 11/20/2023   VD25OH 76.8 07/19/2021   VD25OH 29.2 (L) 04/20/2021    Attestations:   I, Special Puri, acting as a Stage manager for Dana Jenkins, DO., have  compiled all relevant documentation for today's office visit on behalf of Dana Jenkins, DO, while in the presence of [employer], DO.  I have spent 54 minutes in the care of the patient today including 44 minutes was spent on face-to-face counseling and reviewing listed medical problems above as outlined in office visit note, providing nutritional and behavioral counseling as outlined in obesity care plan, independently interpreting results and goals of care, see listed medical problems, and discussing biometric information and progress. We reviewed her meal plan and discussed how the foods she's eating is affecting each one of her labs. Pt educated on why we want her to eat various foods in various amounts and has a better understanding of the nutritional plan because of this. All her questions were answered today.   I have reviewed the above documentation for accuracy and completeness, and I agree with the above. Dana Mccoy, D.O.  The 21st Century Cures Act was signed into law in 2016 which includes the topic of electronic health records.  This provides immediate access to information in MyChart.  This includes consultation notes, operative notes, office notes, lab results and pathology reports.  If you have any questions about what you read please let us  know at your next visit so we can discuss your concerns and take corrective action if need be.  We are right here with you.

## 2023-12-18 ENCOUNTER — Ambulatory Visit

## 2023-12-18 DIAGNOSIS — G4719 Other hypersomnia: Secondary | ICD-10-CM

## 2023-12-19 ENCOUNTER — Encounter: Payer: Self-pay | Admitting: Internal Medicine

## 2023-12-19 DIAGNOSIS — H40032 Anatomical narrow angle, left eye: Secondary | ICD-10-CM | POA: Diagnosis not present

## 2023-12-19 NOTE — Progress Notes (Unsigned)
 Subjective:    Patient ID: Dana Mccoy, female    DOB: 1955/11/23, 68 y.o.   MRN: [id number]      HPI Dana Mccoy is here for No chief complaint on file.        Medications and allergies reviewed with patient and updated if appropriate.  Current Outpatient Medications on File Prior to Visit  Medication Sig Dispense Refill   acetaminophen (TYLENOL) 500 MG tablet Take 500 mg by mouth as needed.     Albuterol -Budesonide  (AIRSUPRA ) 90-80 MCG/ACT AERO Inhale 2 puffs into the lungs every 4 (four) hours as needed (wheezing, shortness of breath). 10.7 g 5   aspirin 81 MG chewable tablet Chew 81 mg by mouth daily.     Calcium  Carbonate-Vit D-Min (CALCIUM  600+D PLUS MINERALS) 600-400 MG-UNIT CHEW Chew 1 each by mouth daily.     Dupilumab  (DUPIXENT ) 300 MG/2ML SOAJ Inject 300 mg into the skin every 14 (fourteen) days. 12 mL 1   EPINEPHrine  0.3 mg/0.3 mL IJ SOAJ injection Inject 0.3 mg into the muscle as needed for anaphylaxis. 2 each 3   FLUoxetine  (PROZAC ) 20 MG capsule Take 1 capsule (20 mg total) by mouth daily. 90 capsule 1   fluticasone  (VERAMYST) 27.5 MCG/SPRAY nasal spray Place 2 sprays into the nose daily.     fluticasone -salmeterol (ADVAIR  HFA) 230-21 MCG/ACT inhaler Inhale 2 puffs into the lungs 2 (two) times daily. 12 g 12   hydrochlorothiazide  (MICROZIDE ) 12.5 MG capsule Take 1 capsule (12.5 mg total) by mouth daily. 90 capsule 2   LACTASE ENZYME PO Take 1 each by mouth as needed.     levothyroxine  (SYNTHROID ) 25 MCG tablet Take 1 tablet daily 4 days a week, take 2 tablets daily 3 days a week.  Take 30 minutes prior to breakfast. 120 tablet 2   loratadine  (CLARITIN ) 10 MG tablet Take 1 tablet (10 mg total) by mouth 2 (two) times daily. 60 tablet 5   losartan  (COZAAR ) 25 MG tablet Take 1 tablet (25 mg total) by mouth daily. 90 tablet 2   mometasone  (ELOCON ) 0.1 % cream APPLY A THIN LAYER TO THE AFFECTED AREA OF THE SKIN DAILY 45 g 0   montelukast  (SINGULAIR ) 10 MG tablet Take 1  tablet (10 mg total) by mouth at bedtime. 30 tablet 11   Multiple Vitamin (MULTIVITAMIN) tablet Take 1 tablet by mouth daily.     pantoprazole  (PROTONIX ) 40 MG tablet Take 1 tablet (40 mg total) by mouth daily. 90 tablet 2   predniSONE  (DELTASONE ) 20 MG tablet Take 2 tablets (40 mg total) by mouth daily with breakfast. 10 tablet 0   promethazine -dextromethorphan (PROMETHAZINE -DM) 6.25-15 MG/5ML syrup Take 5 mLs by mouth 4 (four) times daily as needed for cough. 118 mL 0   rosuvastatin  (CRESTOR ) 5 MG tablet Take 1 tablet (5 mg total) by mouth daily. 90 tablet 3   sucralfate  (CARAFATE ) 1 g tablet Take by mouth 4 (four) times daily as needed.     Tiotropium Bromide  Monohydrate (SPIRIVA  RESPIMAT) 1.25 MCG/ACT AERS Inhale 2 puffs into the lungs daily. 12 g 11   valACYclovir  (VALTREX ) 500 MG tablet Take 500 mg by mouth 2 (two) times daily. (Patient taking differently: Take 500 mg by mouth daily as needed.)     Vitamin D , Ergocalciferol , (DRISDOL ) 1.25 MG (50000 UNIT) CAPS capsule Take 1 capsule (50,000 Units total) by mouth every 7 (seven) days. 4 capsule 0   [DISCONTINUED] omeprazole  (PRILOSEC) 40 MG capsule TAKE 1 CAPSULE BY MOUTH ONCE DAILY  90 capsule 0   No current facility-administered medications on file prior to visit.    Review of Systems     Objective:  There were no vitals filed for this visit. BP Readings from Last 3 Encounters:  12/04/23 113/68  12/01/23 118/80  11/20/23 (!) 145/75   Wt Readings from Last 3 Encounters:  12/04/23 194 lb (88 kg)  12/01/23 199 lb 2 oz (90.3 kg)  11/20/23 197 lb (89.4 kg)   There is no height or weight on file to calculate BMI.    Physical Exam         Assessment & Plan:    See Problem List for Assessment and Plan of chronic medical problems.

## 2023-12-20 ENCOUNTER — Encounter (INDEPENDENT_AMBULATORY_CARE_PROVIDER_SITE_OTHER): Payer: Self-pay

## 2023-12-20 ENCOUNTER — Ambulatory Visit (INDEPENDENT_AMBULATORY_CARE_PROVIDER_SITE_OTHER): Admitting: Internal Medicine

## 2023-12-20 VITALS — BP 110/70 | HR 60 | Temp 98.1°F | Ht 64.0 in | Wt 200.0 lb

## 2023-12-20 DIAGNOSIS — R222 Localized swelling, mass and lump, trunk: Secondary | ICD-10-CM

## 2023-12-20 DIAGNOSIS — I10 Essential (primary) hypertension: Secondary | ICD-10-CM | POA: Diagnosis not present

## 2023-12-20 NOTE — Patient Instructions (Signed)
    An ultrasound of your chest wall was ordered and someone will call you to schedule an appointment.

## 2023-12-20 NOTE — Assessment & Plan Note (Signed)
 Chronic Blood pressure well controlled Continue hydrochlorothiazide  12.5 mg daily, losartan  25 mg daily

## 2023-12-20 NOTE — Assessment & Plan Note (Signed)
 New Friend noticed it - she did not realize it was there No pain, soft, but not mobile Not bone - sternum around it and ribs feel normal ? Lipoma, ectopic thymus, tumor US  of chest wall asap - further evaluation depending on US  results

## 2023-12-21 ENCOUNTER — Other Ambulatory Visit (HOSPITAL_COMMUNITY): Payer: Self-pay

## 2023-12-21 ENCOUNTER — Other Ambulatory Visit: Payer: Self-pay

## 2023-12-21 NOTE — Progress Notes (Signed)
 Specialty Pharmacy Refill Coordination Note  MyChart Questionnaire Submission  Dana Mccoy is a 68 y.o. female contacted today regarding refills of specialty medication(s) Dupixent .  Doses on hand: (Patient-Rptd) 0   Injection date: (Patient-Rptd) 12/30/23  Patient requested: (Patient-Rptd) Pickup at [facility] date: 12/26/23  Medication will be filled on 12/25/23.

## 2023-12-22 ENCOUNTER — Ambulatory Visit (HOSPITAL_BASED_OUTPATIENT_CLINIC_OR_DEPARTMENT_OTHER)
Admission: RE | Admit: 2023-12-22 | Discharge: 2023-12-22 | Disposition: A | Discharge status: Home / Self Care | Source: Ambulatory Visit | Attending: Internal Medicine | Admitting: Internal Medicine

## 2023-12-22 ENCOUNTER — Ambulatory Visit: Payer: Self-pay | Admitting: Internal Medicine

## 2023-12-22 ENCOUNTER — Ambulatory Visit: Admitting: Nurse Practitioner

## 2023-12-22 DIAGNOSIS — H43812 Vitreous degeneration, left eye: Secondary | ICD-10-CM | POA: Diagnosis not present

## 2023-12-22 DIAGNOSIS — R222 Localized swelling, mass and lump, trunk: Secondary | ICD-10-CM | POA: Insufficient documentation

## 2023-12-22 DIAGNOSIS — G4733 Obstructive sleep apnea (adult) (pediatric): Secondary | ICD-10-CM | POA: Diagnosis not present

## 2023-12-25 ENCOUNTER — Other Ambulatory Visit: Payer: Self-pay

## 2023-12-27 ENCOUNTER — Ambulatory Visit: Admitting: Internal Medicine

## 2023-12-27 ENCOUNTER — Encounter: Payer: Self-pay | Admitting: Internal Medicine

## 2023-12-27 ENCOUNTER — Telehealth: Payer: Self-pay

## 2023-12-27 VITALS — BP 124/84 | HR 68 | Temp 97.7°F | Ht 65.0 in | Wt 197.0 lb

## 2023-12-27 DIAGNOSIS — K219 Gastro-esophageal reflux disease without esophagitis: Secondary | ICD-10-CM | POA: Diagnosis not present

## 2023-12-27 DIAGNOSIS — J4550 Severe persistent asthma, uncomplicated: Secondary | ICD-10-CM

## 2023-12-27 DIAGNOSIS — J310 Chronic rhinitis: Secondary | ICD-10-CM | POA: Diagnosis not present

## 2023-12-27 DIAGNOSIS — G4733 Obstructive sleep apnea (adult) (pediatric): Secondary | ICD-10-CM | POA: Diagnosis not present

## 2023-12-27 DIAGNOSIS — R7689 Other specified abnormal immunological findings in serum: Secondary | ICD-10-CM | POA: Diagnosis not present

## 2023-12-27 NOTE — Progress Notes (Signed)
 Dana Mccoy    [id number]    1955/03/30  Primary Care Physician:Burns, Glade PARAS, MD Date of Appointment: 12/27/2023 Established Patient Visit  Chief complaint:   Chief Complaint  Patient presents with   Asthma    Patient states she had an asthma attack and a viral infection a month ago. Her asthma is somewhat controlled today.     HPI: Dana Mccoy is a 68 y.o. woman with severe persistent asthma with elevated IgE and sensitivity to aeroallergens on xolair  since early 2023. Switch to dupixent  at last visit.  Allergist - Dr. Iva  Interval Updates: Here for follow up 3 month follow up for asthma. Had viral URI in september and saw PCP. Treated with steroids. Did not end up taking antibiotics.   Has started dupixent  injections. No adverse effects. Had some issues with autoinjector and isn't sure she got the medicine each time.   Diagnosed with sleep apnea. Hasn't followed up yet.  Has episodes of choking on food causing coughing. Doesn't have overt heartburn. Longstanding reflux.   Current Regimen: advair  230 2 puffs bid,  with spiriva , dupixent  Asthma Triggers: She does have environmental allergies in the fall - dust, mold, grass. URIs Exacerbations in the last year:  at least 4 exacerbations in the last 12 months.  History of hospitalization or intubation: never intubated Allergy Testing: yes food allergies tested multiple - all negative. Eats nuts fine. Had SPT done for environmental allergies many years ago.  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   Exposures: lives at home alone with dog.  Smoking history: former smoker 15 pack years, quit in 1987.   I have reviewed the patient's family social and past medical history and updated as appropriate.   Past Medical History:  Diagnosis Date   Anemia    Angio-edema    Anxiety    Asthma due to environmental allergies    Asthma exacerbation, mild 02/12/2020   Back pain    Cervical stenosis of spine    Constipation    Dysphonia    Frequent headaches    GERD (gastroesophageal reflux disease)    Hashimoto's disease    Hyperlipidemia    Hypertension    IBS (irritable bowel syndrome)    Joint pain    Lactose intolerance    Metabolic syndrome    Multiple food allergies    Obesity    Osteoarthritis    Osteopenia    Other fatigue    Palpitations    Periodontal disease    Prediabetes    Reactive airway disease    post CAP   Shortness of breath    Shortness of breath on exertion    Swallowing difficulty     Past Surgical History:  Procedure Laterality Date   CARDIAC CATHETERIZATION  1997   negative   COLONOSCOPY  2007   Dr Donnald   DILATION AND CURETTAGE OF UTERUS     UPPER GI ENDOSCOPY     X 2   WISDOM TOOTH EXTRACTION      Family History  Adopted: Yes  Problem Relation Age of Onset   Cancer Mother    Allergic rhinitis Mother    Anxiety disorder Mother    Asthma Neg Hx    Eczema Neg Hx    Urticaria Neg Hx     Social History   Occupational History   Occupation: Copywriter, advertising: [employer]    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  Tobacco Use   Smoking status: Former    Current packs/day: 0.00    Average packs/day: 1.5 packs/day for 12.0 years (18.0 ttl pk-yrs)    Types: Cigarettes    Start date: 03/14/1973    Quit date: 03/14/1985    Years since quitting: 38.8   Smokeless tobacco: Never   Tobacco comments:    smoked 1975-1986, up to 1.5 ppd  Vaping Use   Vaping status: Never Used  Substance and Sexual Activity   Alcohol use: No    Comment: very  rarely   Drug use: No   Sexual activity: Not Currently     Physical Exam: Blood pressure 124/84, pulse 68, temperature 97.7 F (36.5 C), temperature source Oral, height 5' 5 (1.651 m), weight 197 lb (89.4 kg), SpO2 99%.  Gen:     Well appearing, no distress ENT: mmm no thrush +cobblestoning Lungs:   ctab no wheeze CV:         RRR no mrg  Data Reviewed: Imaging: I have personally reviewed the chest xray 11/2020 no acute pulmonary process  PFTs:      No data to display         Feno is 11 ppb today.  Spirometry normal 10/27/20  Labs: IgE 144 in Nov 2022  Immunization status: Immunization History  Administered Date(s) Administered   INFLUENZA, HIGH DOSE SEASONAL PF 12/10/2020, 02/04/2022   Influenza,inj,Quad PF,6+ Mos 11/16/2016   Influenza-Unspecified 12/12/2017   Pfizer Covid-19 Vaccine Bivalent Booster 105yrs & up 12/24/2020   Pfizer(Comirnaty)Fall Seasonal Vaccine 12 years and older 12/08/2022   Pneumococcal Polysaccharide-23 07/31/2012, 01/26/2022   Tdap 07/22/2021    External Records Personally Reviewed: pulmonary, allergy,   Assessment:  Severe persistent asthma not well controlled, recurrent exacerbations requiring steroids Allergic rhinitis with aeroallergen sensitivity, controlled Elevated IGE GERD, controlled OSA  Plan/Recommendations: Continue dupixent  for now. Will have pharmacy reach out for injector educatio.  Continue advair .   Continue albuterol -budesonide  aka airsupra . Every 4 hours as needed for shortness of breath, wheezing, cough, asthma symptoms.  Continue montelukast  with claritin   Continue acid reflux treatment.    Return to Care: Return in about 4 months (around 04/28/2024) for Dr Pleas.    Verdon Gore, MD Pulmonary and Critical Care Medicine [facility] Office:[phone]

## 2023-12-27 NOTE — Telephone Encounter (Signed)
 Received CC chart message from Dr. Meade - requesting assistance with Dupixent  injection.   Called patient - she reports she feels she is missing her injection because a lot of liquid spills with each injection. We talked through some tips on ensure to hold the pen against the skin until she hears the second click, but unable to troubleshoot over the phone.   Scheduled for Dupixent  retraining on Monday, 01/01/24 with pharmacy team.   Aleck Puls, PharmD, BCPS, CPP Clinical Pharmacist  [facility]

## 2023-12-27 NOTE — Patient Instructions (Signed)
 It was a pleasure to see you today!  Please schedule follow up with Dr Pleas in 4 months.  If my schedule is not open yet, we will contact you with a reminder closer to that time. Please call [phone] if you haven't heard from us  a month before, and always call us  sooner if issues or concerns arise. You can also send us  a message through MyChart, but but aware that this is not to be used for urgent issues and it may take up to 5-7 days to receive a reply. Please be aware that you will likely be able to view your results before I have a chance to respond to them. Please give us  5 business days to respond to any non-urgent results.    Continue dupixent  for now. I will have our pharmacy team reach out to you to make sure the injector is working properly.   Continue advair  and spiriva .   Continue  airsupra . Every 4 hours as needed for shortness of breath, wheezing, cough, asthma symptoms.   Continue montelukast  with claritin   Continue acid reflux treatment.  Look into Kouffman Acid reflux diet for more tips.   What is GERD? Gastroesophageal reflux disease (GERD) is gastroesophageal reflux diseasewhich occurs when the lower esophageal sphincter (LES) opens spontaneously, for varying periods of time, or does not close properly and stomach contents rise up into the esophagus. GER is also called acid reflux or acid regurgitation, because digestive juices--called acids--rise up with the food. The esophagus is the tube that carries food from the mouth to the stomach. The LES is a ring of muscle at the bottom of the esophagus that acts like a valve between the esophagus and stomach.  When acid reflux occurs, food or fluid can be tasted in the back of the mouth. When refluxed stomach acid touches the lining of the esophagus it may cause a burning sensation in the chest or throat called heartburn or acid indigestion. Occasional reflux is common. Persistent reflux that occurs more than twice a week is  considered GERD, and it can eventually lead to more serious health problems. People of all ages can have GERD. Studies have shown that GERD may worsen or contribute to asthma, chronic cough, and pulmonary fibrosis.   What are the symptoms of GERD? The main symptom of GERD in adults is frequent heartburn, also called acid indigestion--burning-type pain in the lower part of the mid-chest, behind the breast bone, and in the mid-abdomen.  Not all reflux is acidic in nature, and many patients don't have heart burn at all. Sometimes it feels like a cough (either dry or with mucus), choking sensation, asthma, shortness of breath, waking up at night, frequent throat clearing, or trouble swallowing.    What causes GERD? The reason some people develop GERD is still unclear. However, research shows that in people with GERD, the LES relaxes while the rest of the esophagus is working. Anatomical abnormalities such as a hiatal hernia may also contribute to GERD. A hiatal hernia occurs when the upper part of the stomach and the LES move above the diaphragm, the muscle wall that separates the stomach from the chest. Normally, the diaphragm helps the LES keep acid from rising up into the esophagus. When a hiatal hernia is present, acid reflux can occur more easily. A hiatal hernia can occur in people of any age and is most often a normal finding in otherwise healthy people over age 71. Most of the time, a hiatal hernia produces  no symptoms.   Other factors that may contribute to GERD include - Obesity or recent weight gain - Pregnancy  - Smoking  - Diet - Certain medications  Common foods that can worsen reflux symptoms include: - carbonated beverages - artificial sweeteners - citrus fruits  - chocolate  - drinks with caffeine or alcohol  - fatty and fried foods  - garlic and onions  - mint flavorings  - spicy foods  - tomato-based foods, like spaghetti sauce, salsa, chili, and pizza   Lifestyle  Changes If you smoke, stop.  Avoid foods and beverages that worsen symptoms (see above.) Lose weight if needed.  Eat small, frequent meals.  Wear loose-fitting clothes.  Avoid lying down for 3 hours after a meal.  Raise the head of your bed 6 to 8 inches by securing wood blocks under the bedposts. Just using extra pillows will not help, but using a wedge-shaped pillow may be helpful.  Medications  H2 blockers, such as cimetidine (Tagamet HB), famotidine (Pepcid AC), nizatidine (Axid AR), and ranitidine (Zantac 75), decrease acid production. They are available in prescription strength and over-the-counter strength. These drugs provide short-term relief and are effective for about half of those who have GERD symptoms.  Proton pump inhibitors include omeprazole  (Prilosec, Zegerid), lansoprazole (Prevacid), pantoprazole  (Protonix ), rabeprazole (Aciphex), and esomeprazole (Nexium), which are available by prescription. Prilosec is also available in over-the-counter strength. Proton pump inhibitors are more effective than H2 blockers and can relieve symptoms and heal the esophageal lining in almost everyone who has GERD.  Because drugs work in different ways, combinations of medications may help control symptoms. People who get heartburn after eating may take both antacids and H2 blockers. The antacids work first to neutralize the acid in the stomach, and then the H2 blockers act on acid production. By the time the antacid stops working, the H2 blocker will have stopped acid production. Your health care provider is the best source of information about how to use medications for GERD.   Points to Remember 1. You can have GERD without having heartburn. Your symptoms could include a dry cough, asthma symptoms, or trouble swallowing.  2. Taking medications daily as prescribed is important in controlling you symptoms.  Sometimes it can take up to 8 weeks to fully achieve the effects of the medications  prescribed.  3. Coughing related to GERD can be difficult to treat and is very frustrating!  However, it is important to stick with these medications and lifestyle modifications before pursuing more aggressive or invasive test and treatments.

## 2023-12-28 ENCOUNTER — Encounter (INDEPENDENT_AMBULATORY_CARE_PROVIDER_SITE_OTHER): Payer: Self-pay | Admitting: Family Medicine

## 2023-12-28 ENCOUNTER — Ambulatory Visit (INDEPENDENT_AMBULATORY_CARE_PROVIDER_SITE_OTHER): Admitting: Family Medicine

## 2023-12-28 ENCOUNTER — Other Ambulatory Visit (HOSPITAL_BASED_OUTPATIENT_CLINIC_OR_DEPARTMENT_OTHER): Payer: Self-pay

## 2023-12-28 VITALS — BP 101/62 | HR 64 | Temp 98.0°F | Ht 65.0 in | Wt 193.0 lb

## 2023-12-28 DIAGNOSIS — E669 Obesity, unspecified: Secondary | ICD-10-CM

## 2023-12-28 DIAGNOSIS — G4733 Obstructive sleep apnea (adult) (pediatric): Secondary | ICD-10-CM | POA: Diagnosis not present

## 2023-12-28 DIAGNOSIS — R7303 Prediabetes: Secondary | ICD-10-CM | POA: Diagnosis not present

## 2023-12-28 DIAGNOSIS — I10 Essential (primary) hypertension: Secondary | ICD-10-CM | POA: Diagnosis not present

## 2023-12-28 DIAGNOSIS — Z6832 Body mass index (BMI) 32.0-32.9, adult: Secondary | ICD-10-CM

## 2023-12-28 DIAGNOSIS — E559 Vitamin D deficiency, unspecified: Secondary | ICD-10-CM | POA: Diagnosis not present

## 2023-12-28 MED ORDER — VITAMIN D (ERGOCALCIFEROL) 1.25 MG (50000 UNIT) PO CAPS
50000.0000 [IU] | ORAL_CAPSULE | ORAL | 0 refills | Status: DC
  Filled 2023-12-28: qty 4, 28d supply, fill #0

## 2023-12-28 NOTE — Progress Notes (Signed)
 Dana DOROTHA Mccoy, D.O.  ABFM, ABOM Specializing in Clinical Bariatric Medicine  Office located at: [address] [place], [place]  [address]      A) FOR THE CHRONIC DISEASE OF OBESITY:  Chief complaint: Obesity Dana Mccoy is here to discuss her progress with her obesity treatment plan.   History of present illness / Interval history:  Dana Mccoy is here today for her follow-up office visit.  Since last OV on 12/04/23, pt is down 1 lb. Pt states that she was gone for a week for her highschool reunion in [place] .She endorses eating cooking and drinking 2 drinks a night. She states that she has been measuring her protein.     12/04/23 11:00 12/28/23 11:00   Body Fat % 42.7 % 41.1 %  Muscle Mass (lbs) 106 lbs 108.4 lbs  Fat Mass (lbs) 83 lbs 79.6 lbs  Total Body Water (lbs) 74.4 lbs 74.2 lbs  Visceral Fat Rating  13 12    Counseling done on how various foods will affect these numbers and how to maximize success   Total lbs lost to date: - 4 lbs Total Fat Mass in lbs lost to date: - 11.8 lbs Total weight loss percentage to date: - 2.03 %    Morbid obesity (HCC) Starting BMI 33.8 Obesity (BMI 30-39.9) Current BMI 32.12  Nutrition Therapy She is on CAT 1 MP with B & L options. She also has the option to journal 150-200 calories and 15+ grams protein for breakfast and states she is following her eating plan approximately 80 % of the time.   - Tracking Calories/Macros: yes  - Eating More Whole Foods: yes  - Adequate Protein Intake: yes  - Adequate Water Intake: no   - Skipping Meals: yes  - Sleeping 7-9 Hours/ Night: no    Dana Mccoy is currently in the action stage of change. As such, her goal is to continue weight management plan.  She has agreed to: continue current plan   Physical Activity Dana Mccoy is walking 25 to 30  minutes 5 to 7 days per week   Dana Mccoy has been advised to work up to 300-450 minutes of moderate intensity aerobic activity a week and  strengthening exercises 2-3 times per week for cardiovascular health, weight loss maintenance and preservation of muscle mass.  She has agreed to : Increase physical activity in their day and reduce sedentary time (increase NEAT)., Increase volume of physical activity to a goal of 240 minutes a week, and Combine aerobic and strengthening exercises for efficiency and improved cardiometabolic health.   Behavioral Modifications Evidence-based interventions for health behavior change were utilized today including the discussion of   1) self monitoring techniques:    2) problem-solving barriers:    3) self care:    4) SMART goals for next OV:    - Walk 30 min every day or more   Regarding patient's less desirable eating habits and patterns, we employed the technique of small changes.   We discussed the following today: increasing lean protein intake to established goals, avoiding skipping meals, and continue to work on implementation of reduced calorie nutritional plan Additional resources provided today: Handout on benefits of exercise for wt loss   Medical Interventions/ Pharmacotherapy Previous Bariatric surgery: n/a Pharmacotherapy for weight loss: She is not currently taking medications  for medical weight loss.    We discussed various medication options to help Dana Mccoy with her weight loss efforts and we both agreed to :  Continue with current nutritional and behavioral strategies   B) OBESITY RELATED CONDITIONS ADDRESSED TODAY:  Primary hypertension Assessment & Plan BP Readings from Last 3 Encounters:  12/28/23 101/62  12/27/23 124/84  12/20/23 110/70   The 10-year ASCVD risk score (Arnett DK, et al., 2019) is: 6.2%  Lab Results  Component Value Date   CREATININE 0.81 11/20/2023   On hydrochlorothiazide  12.5 mg daily and Losartan  25 mg daily. BP looks good today- well controlled. Pt endorses that on days that her BP is running low she does not feel good. Pt denies any  lightheadedness or dizziness today. Pt states that in the last 2 weeks she has felt a little dizzy. Explained to pt that she should check her BP at home every other day. Recommended to pt that if her BP is around 100/60 consistently or if she feels dizzy she should reach out to her PCP/specialist about cutting back on her medications. Continue taking medications and monitoring BP.     Prediabetes Assessment & Plan Lab Results  Component Value Date   HGBA1C 5.8 (H) 11/20/2023   HGBA1C 5.7 (H) 07/19/2021   HGBA1C 5.7 (H) 04/20/2021   INSULIN  13.5 11/20/2023   INSULIN  11.4 04/20/2021    Diet and lifestyle controlled. Pt states that she has good control of hunger and cravings. No acute concerns today. Continue following prudent nutritional meal plan and decreasing simple carbs and sugar.     Vitamin D  deficiency Assessment & Plan Lab Results  Component Value Date   VD25OH 26.9 (L) 11/20/2023   VD25OH 76.8 07/19/2021   VD25OH 29.2 (L) 04/20/2021    On ERGO 50K units every week. Good compliance and tolerance. No acute concerns today. Continue supplementation. Will refill today. Will recheck levels in 3 to 4 months.     Mild obstructive sleep apnea in adult Assessment & Plan Pt states that she was diagnosed with mild sleep apnea recently. Her AHI was 7 per hour. She was pup on AutoPap titration trial at home. She endorses that she has slept without waking up more than 3 times and she felt refreshed and better the next day. She will be meeting with specialist next week to talk about machines she's will be needing to use. Continue following up with specialist. Will monitor alongside specialist.      Medications Discontinued During This Encounter  Medication Reason   Vitamin D , Ergocalciferol , (DRISDOL ) 1.25 MG (50000 UNIT) CAPS capsule Reorder     Meds ordered this encounter  Medications   Vitamin D , Ergocalciferol , (DRISDOL ) 1.25 MG (50000 UNIT) CAPS capsule    Sig: Take 1 capsule  (50,000 Units total) by mouth every 7 (seven) days.    Dispense:  4 capsule    Refill:  0      Follow up:   Return 01/25/2024 at 10:20 AM  She was informed of the importance of frequent follow up visits to maximize her success with intensive lifestyle modifications for her multiple health conditions.   Weight Summary and Biometrics   Weight Lost Since Last Visit: 1lb  Weight Gained Since Last Visit: 0lb    Vitals Temp: 98 F (36.7 C) BP: 101/62 Pulse Rate: 64 SpO2: 96 %   Anthropometric Measurements Height: 5' 5 (1.651 m) Weight: 193 lb (87.5 kg) BMI (Calculated): 32.12 Weight at Last Visit: 194lb Weight Lost Since Last Visit: 1lb Weight Gained Since Last Visit: 0lb Starting Weight: 197lb Total Weight Loss (lbs): 4 lb (1.814 kg) Peak Weight: 271lb   Body  Composition  Body Fat %: 41.1 % Fat Mass (lbs): 79.6 lbs Muscle Mass (lbs): 108.4 lbs Total Body Water (lbs): 74.2 lbs Visceral Fat Rating : 12   Other Clinical Data Fasting: no Labs: no Today's Visit #: 3 Starting Date: 11/20/23    Objective:   PHYSICAL EXAM: Blood pressure 101/62, pulse 64, temperature 98 F (36.7 C), height 5' 5 (1.651 m), weight 193 lb (87.5 kg), SpO2 96%. Body mass index is 32.12 kg/m.  General: she is overweight, cooperative and in no acute distress. PSYCH: Has normal mood, affect and thought process.   HEENT: EOMI, sclerae are anicteric. Lungs: Normal breathing effort, no conversational dyspnea. Extremities: Moves * 4 Neurologic: A and O * 3, good insight  DIAGNOSTIC DATA REVIEWED: BMET    Component Value Date/Time   NA 141 11/20/2023 1057   K 4.3 11/20/2023 1057   CL 101 11/20/2023 1057   CO2 24 11/20/2023 1057   GLUCOSE 76 11/20/2023 1057   GLUCOSE 81 11/18/2020 1557   GLUCOSE 77 01/24/2006 1211   BUN 19 11/20/2023 1057   CREATININE 0.81 11/20/2023 1057   CALCIUM  10.1 11/20/2023 1057   GFRNONAA >60 11/18/2020 1557   GFRAA >60 11/10/2019 0032   Lab  Results  Component Value Date   HGBA1C 5.8 (H) 11/20/2023   HGBA1C 5.7 01/24/2006   Lab Results  Component Value Date   INSULIN  13.5 11/20/2023   INSULIN  11.4 04/20/2021   Lab Results  Component Value Date   TSH 3.870 11/20/2023   CBC    Component Value Date/Time   WBC 6.9 11/20/2023 1057   WBC 6.0 01/21/2021 1154   RBC 5.07 11/20/2023 1057   RBC 4.74 01/21/2021 1154   HGB 13.3 11/20/2023 1057   HCT 43.1 11/20/2023 1057   PLT 297 11/20/2023 1057   MCV 85 11/20/2023 1057   MCH 26.2 (L) 11/20/2023 1057   MCH 27.8 11/18/2020 1557   MCHC 30.9 (L) 11/20/2023 1057   MCHC 31.9 01/21/2021 1154   RDW 14.1 11/20/2023 1057   Iron Studies    Component Value Date/Time   IRON 62 09/06/2019 1420   TIBC 401 09/06/2019 1420   FERRITIN 16 09/06/2019 1420   IRONPCTSAT 15 (L) 09/06/2019 1420   Lipid Panel     Component Value Date/Time   CHOL 196 11/20/2023 1057   CHOL 260 (H) 09/05/2014 1026   TRIG 198 (H) 11/20/2023 1057   TRIG 161 (H) 09/05/2014 1026   HDL 61 11/20/2023 1057   HDL 59 09/05/2014 1026   CHOLHDL 3.2 11/20/2023 1057   CHOLHDL 5 09/06/2019 1420   VLDL 47.0 (H) 09/06/2019 1420   LDLCALC 101 (H) 11/20/2023 1057   LDLCALC 169 (H) 09/05/2014 1026   LDLDIRECT 150.0 09/06/2019 1420   Hepatic Function Panel     Component Value Date/Time   PROT 7.0 11/20/2023 1057   ALBUMIN 4.5 11/20/2023 1057   AST 22 11/20/2023 1057   ALT 11 11/20/2023 1057   ALKPHOS 128 (H) 11/20/2023 1057   BILITOT 0.5 11/20/2023 1057   BILIDIR 0.0 09/05/2014 1026      Component Value Date/Time   TSH 3.870 11/20/2023 1057   Nutritional Lab Results  Component Value Date   VD25OH 26.9 (L) 11/20/2023   VD25OH 76.8 07/19/2021   VD25OH 29.2 (L) 04/20/2021    Attestations:   LILLETTE Sonny Laroche, acting as a Stage manager for Dana Jenkins, DO., have compiled all relevant documentation for today's office visit on behalf of Dana Jenkins, DO,  while in the presence of [employer],  DO.   I have reviewed the above documentation for accuracy and completeness, and I agree with the above. Dana Mccoy, D.O.  The 21st Century Cures Act was signed into law in 2016 which includes the topic of electronic health records.  This provides immediate access to information in MyChart.  This includes consultation notes, operative notes, office notes, lab results and pathology reports.  If you have any questions about what you read please let us  know at your next visit so we can discuss your concerns and take corrective action if need be.  We are right here with you.

## 2023-12-29 ENCOUNTER — Other Ambulatory Visit (HOSPITAL_BASED_OUTPATIENT_CLINIC_OR_DEPARTMENT_OTHER): Payer: Self-pay

## 2023-12-30 ENCOUNTER — Other Ambulatory Visit (HOSPITAL_BASED_OUTPATIENT_CLINIC_OR_DEPARTMENT_OTHER): Payer: Self-pay

## 2023-12-30 MED ORDER — FLUZONE HIGH-DOSE 0.5 ML IM SUSY
0.5000 mL | PREFILLED_SYRINGE | Freq: Once | INTRAMUSCULAR | 0 refills | Status: AC
  Filled 2023-12-30: qty 0.5, 1d supply, fill #0

## 2024-01-01 ENCOUNTER — Ambulatory Visit (INDEPENDENT_AMBULATORY_CARE_PROVIDER_SITE_OTHER)

## 2024-01-01 ENCOUNTER — Ambulatory Visit (INDEPENDENT_AMBULATORY_CARE_PROVIDER_SITE_OTHER): Payer: Self-pay | Admitting: Cardiology

## 2024-01-01 ENCOUNTER — Encounter (HOSPITAL_BASED_OUTPATIENT_CLINIC_OR_DEPARTMENT_OTHER): Payer: Self-pay | Admitting: Cardiology

## 2024-01-01 VITALS — BP 112/68 | HR 66 | Ht 65.0 in | Wt 198.5 lb

## 2024-01-01 DIAGNOSIS — Z7189 Other specified counseling: Secondary | ICD-10-CM

## 2024-01-01 DIAGNOSIS — I10 Essential (primary) hypertension: Secondary | ICD-10-CM | POA: Diagnosis not present

## 2024-01-01 DIAGNOSIS — R072 Precordial pain: Secondary | ICD-10-CM

## 2024-01-01 DIAGNOSIS — J4550 Severe persistent asthma, uncomplicated: Secondary | ICD-10-CM | POA: Diagnosis not present

## 2024-01-01 DIAGNOSIS — E782 Mixed hyperlipidemia: Secondary | ICD-10-CM | POA: Diagnosis not present

## 2024-01-01 DIAGNOSIS — Z6833 Body mass index (BMI) 33.0-33.9, adult: Secondary | ICD-10-CM | POA: Diagnosis not present

## 2024-01-01 DIAGNOSIS — E6609 Other obesity due to excess calories: Secondary | ICD-10-CM | POA: Diagnosis not present

## 2024-01-01 DIAGNOSIS — E66811 Obesity, class 1: Secondary | ICD-10-CM

## 2024-01-01 NOTE — Patient Instructions (Signed)

## 2024-01-01 NOTE — Progress Notes (Signed)
 Cardiology Office Note:  .   Date:  01/01/2024  ID:  Dana Mccoy, DOB [birth year], MRN [id number] PCP: Geofm Glade PARAS, MD  [facility] Providers Cardiologist:  Shelda Bruckner, MD {  History of Present Illness: .   Dana Mccoy is a 68 y.o. female with PMH hypertension, hyperlipidemia, severe persistent asthma, OSA, hypothyroidism 2/2 hashimoto's thyroiditis. She is seen as a new patient for evaluation of hypertension at the request of Dr. Geofm. She was previously seen for evaluation of bradycardia but has not been seen by cardiology since 06/2020.  Pertinent CV history: CT coronary 2021 with Ca score 0, no significant disease. ETT 2020 low risk. Monitor 2020 with HR range 44-131, average 63 bpm. No significant arrhythmia. 6 triggered events were sinus.  Today: Moved to [place]  and then recently moved back to [place]. Has been following closely with Dr. Geofm for fatigue and established with Dr. Meade for asthma/sleep apnea.  Last year had horrible chest pain and headache while living in [place]. Admitted to [facility], workup did not show MI. Had CT coronary, echo, nuclear stress per report, all normal. Requested records through Care Everywhere, not available. No respiratory symptoms, BP was elevated. Saw GI, not felt to be GI. Has had intermittent chest pain since then but nothing severe. Happens very rarely, lasts short time, does not limit her.  Discussed diet, exercise. Walks her dog 4-5 times/day. Doesn't tolerate hot/humid weather, but now walking a few days/week at the park for 45 minutes or so at a time. Some days feels short of breath doing nothing, sometimes it is with minimal activity, and she is not sure if this is from asthma or not.  Has had long term chronic fatigue, stable. Just found that she has sleep apnea, starting on CPAP. Notes periodic asymptomatic HR in the 40s especially around sleep, does augment with activity based on her watch.  ROS:  Denies PND, orthopnea, LE edema or unexpected weight gain. No syncope or palpitations. ROS otherwise negative except as noted.   Studies Reviewed: SABRA    EKG:       Physical Exam:   VS:  BP 112/68   Pulse 66   Ht 5' 5 (1.651 m)   Wt 198 lb 8 oz (90 kg)   SpO2 91%   BMI 33.03 kg/m    Wt Readings from Last 3 Encounters:  01/01/24 198 lb 8 oz (90 kg)  12/28/23 193 lb (87.5 kg)  12/27/23 197 lb (89.4 kg)    GEN: Well nourished, well developed in no acute distress HEENT: Normal, moist mucous membranes NECK: No JVD CARDIAC: regular rhythm, normal S1 and S2, no rubs or gallops. No murmur. VASCULAR: Radial and DP pulses 2+ bilaterally. No carotid bruits RESPIRATORY:  Clear to auscultation without rales, wheezing or rhonchi  ABDOMEN: Soft, non-tender, non-distended MUSCULOSKELETAL:  Ambulates independently SKIN: Warm and dry, no edema NEUROLOGIC:  Alert and oriented x 3. No focal neuro deficits noted. PSYCHIATRIC:  Normal affect    ASSESSMENT AND PLAN: .    Hypertension:  -continue hydrochlorothiazide  and losartan  -has OSA, on CPAP -discussed salt and how can contribute to high blood pressure  Chest pain: unclear what triggered event last year in [place] , trying to get records. She reports that workup was normal. Has had mild, brief, intermittent symptoms outside of that event. -reviewed red flag warning signs that need immediate medical attention.  Mixed hyperlipidemia Obesity, BMI 33 -did not tolerate atorvastatin  (cognitive impairment) -tolerating  rosuvastatin  5 mg without issues -unclear when statin/aspirin started, she believes this was started after her hospitalization last year.  -most recent lipids 11/20/23 show Tchol 196, TG 198, HDL 61, LDL 101 (fasting, on statin) -we discussed that if they saw CAD, goal LDL <70. Requesting records as above. -discussed goal TG <150 -has been to healthy weight and wellness in the past, she is interested in lifestyle change for weight  loss over medications -reviewed lifestyle recommendations, below   CV risk counseling and prevention -recommend heart healthy/Mediterranean diet, with whole grains, fruits, vegetable, fish, lean meats, nuts, and olive oil. Limit salt. -recommend moderate walking, 3-5 times/week for 30-50 minutes each session. Aim for at least 150 minutes/week. Goal should be pace of 3 miles/hours, or walking 1.5 miles in 30 minutes -recommend avoidance of tobacco products. Avoid excess alcohol. -ASCVD risk score: The 10-year ASCVD risk score (Arnett DK, et al., 2019) is: 7.6%   Values used to calculate the score:     Age: 33 years     Clincally relevant sex: Female     Is Non-Hispanic African American: No     Diabetic: No     Tobacco smoker: No     Systolic Blood Pressure: 112 mmHg     Is BP treated: Yes     HDL Cholesterol: 61 mg/dL     Total Cholesterol: 196 mg/dL    Dispo: 1 year or sooner as needed  Signed, Shelda Bruckner, MD   Shelda Bruckner, MD, PhD, [facility] [place]  [facility]  [place]  [facility] at [facility] [address], [address] [place], [place] [address] [phone]

## 2024-01-01 NOTE — Progress Notes (Signed)
 HPI Patient presents today to [facility] to see pharmacy team for Dupixent  retraining. At last OV with Dr. Meade, patient reported issues with Dupixent  injection. Patient has been taking Dupixent  since July 2025, but reports lately she has missed about 2-3 doses due to injection error.   She endorses significant burning with the injection when the drug is administered. Due to burning with injection, she tends to pull back the pen before the second click and notices that drug does not get administered.   She brings her injection with her today for observed administration and re-training.   OBJECTIVE Allergies  Allergen Reactions   Penicillins     RASH, amoxicillin   Lipitor [Atorvastatin ] Other (See Comments)    Cognitive impact   Statins     Other Reaction(s): cognitive issues    Outpatient Encounter Medications as of 01/01/2024  Medication Sig   acetaminophen (TYLENOL) 500 MG tablet Take 500 mg by mouth as needed.   Albuterol -Budesonide  (AIRSUPRA ) 90-80 MCG/ACT AERO Inhale 2 puffs into the lungs every 4 (four) hours as needed (wheezing, shortness of breath).   aspirin 81 MG chewable tablet Chew 81 mg by mouth daily.   Calcium  Carbonate-Vit D-Min (CALCIUM  600+D PLUS MINERALS) 600-400 MG-UNIT CHEW Chew 1 each by mouth daily.   Dupilumab  (DUPIXENT ) 300 MG/2ML SOAJ Inject 300 mg into the skin every 14 (fourteen) days.   EPINEPHrine  0.3 mg/0.3 mL IJ SOAJ injection Inject 0.3 mg into the muscle as needed for anaphylaxis.   FLUoxetine  (PROZAC ) 20 MG capsule Take 1 capsule (20 mg total) by mouth daily.   fluticasone  (VERAMYST) 27.5 MCG/SPRAY nasal spray Place 2 sprays into the nose daily.   fluticasone -salmeterol (ADVAIR  HFA) 230-21 MCG/ACT inhaler Inhale 2 puffs into the lungs 2 (two) times daily.   hydrochlorothiazide  (MICROZIDE ) 12.5 MG capsule Take 1 capsule (12.5 mg total) by mouth daily.   LACTASE ENZYME PO Take 1 each by mouth as needed.   levothyroxine  (SYNTHROID ) 25 MCG  tablet Take 1 tablet daily 4 days a week, take 2 tablets daily 3 days a week.  Take 30 minutes prior to breakfast.   loratadine  (CLARITIN ) 10 MG tablet Take 1 tablet (10 mg total) by mouth 2 (two) times daily.   losartan  (COZAAR ) 25 MG tablet Take 1 tablet (25 mg total) by mouth daily.   mometasone  (ELOCON ) 0.1 % cream APPLY A THIN LAYER TO THE AFFECTED AREA OF THE SKIN DAILY   montelukast  (SINGULAIR ) 10 MG tablet Take 1 tablet (10 mg total) by mouth at bedtime.   Multiple Vitamin (MULTIVITAMIN) tablet Take 1 tablet by mouth daily.   pantoprazole  (PROTONIX ) 40 MG tablet Take 1 tablet (40 mg total) by mouth daily.   rosuvastatin  (CRESTOR ) 5 MG tablet Take 1 tablet (5 mg total) by mouth daily.   sucralfate  (CARAFATE ) 1 g tablet Take by mouth 4 (four) times daily as needed.   Tiotropium Bromide  Monohydrate (SPIRIVA  RESPIMAT) 1.25 MCG/ACT AERS Inhale 2 puffs into the lungs daily.   valACYclovir  (VALTREX ) 500 MG tablet Take 500 mg by mouth 2 (two) times daily. (Patient taking differently: Take 500 mg by mouth daily as needed.)   Vitamin D , Ergocalciferol , (DRISDOL ) 1.25 MG (50000 UNIT) CAPS capsule Take 1 capsule (50,000 Units total) by mouth every 7 (seven) days.   [DISCONTINUED] omeprazole  (PRILOSEC) 40 MG capsule TAKE 1 CAPSULE BY MOUTH ONCE DAILY   No facility-administered encounter medications on file as of 01/01/2024.     Immunization History  Administered Date(s) Administered  INFLUENZA, HIGH DOSE SEASONAL PF 12/10/2020, 02/04/2022, 12/30/2023   Influenza,inj,Quad PF,6+ Mos 11/16/2016   Influenza-Unspecified 12/12/2017   Pfizer Covid-19 Vaccine Bivalent Booster 43yrs & up 12/24/2020   Pfizer(Comirnaty)Fall Seasonal Vaccine 12 years and older 12/08/2022   Pneumococcal Polysaccharide-23 07/31/2012, 01/26/2022   Tdap 07/22/2021     PFTs     No data to display            Assessment   dupilumab  (Dupixent ) device re-training   Goals of therapy: - reviewed at initial counseling  visit on 10/03/23, see note  Mechanism: human monoclonal IgG4 antibody that inhibits interleukin-4 and interleukin-13 cytokine-induced responses, including release of proinflammatory cytokines, chemokines, and IgE Reviewed that Dupixent  is add-on medication and patient must continue maintenance inhaler regimen. Response to therapy: may take 4 months to determine efficacy. Discussed that patients generally feel improvement sooner than 4 months.  Side effects: injection site reaction (6-18%), antibody development (5-16%), ophthalmic conjunctivitis (2-16%), transient blood eosinophilia (1-2%)  Administration/Storage: - reviewed at initial counseling visit on 10/03/23, see note  Reviewed administration sites of thigh or abdomen (at least 2-3 inches away from abdomen). Reviewed the upper arm is only appropriate if caregiver is administering injection  Do not shake pen/syringe as this could lead to product foaming or precipitation. Do not use if solution is discolored or contains particulate matter or if window on prefilled pen is yellow (indicates pen has been used).  Reviewed storage of medication in refrigerator. Reviewed that Dupixent  can be stored at room temperature in unopened carton for up to 14 days.  Access: - reviewed at initial counseling visit on 10/03/23, see note  Approval of Dupixent  through: insurance  Patient self-administered Dupixent  300mg /49ml x 1 (total dose 300mg ) in her left upper thigh. She successfully administered the dose, but endorses she almost pulled back pen too soon due to burning with injection.   She denies other adverse reactions with Dupixent .  Discussed options to overcome barrier to Dupixent  injection:  1) Given she is able to verbalize and demonstrate proper administration, may continue with Dupixent  autoinjector pen if patient desires.  2) May try using Dupixent  prefilled syringe with a different injection device.  3) If pain with injection is unbearable, may  discuss alternatives with pulmonologist.  Patient ultimately opts to try using Dupixent  prefilled syringe. She has used a similar device in the past. We watched the manufacturer's video on how to inject Dupixent  prefilled syringe. Provided handout with visual information. Patient is aware we will need to get approval through insurance. She prefers to first use Dupixent  prefilled syringe sample.   Provided sample that patient can take home and use for next dose in 14 days:  Dupixent  300mg /37mL injection pre-filled syringe NDC: 9975-4085-97 Lot: [id number]  Expiration: 2026 08   PLAN Continue Dupixent  300mg  every 14 days.  Next dose is due 11/3/325 and every 14 days thereafter. Patient was given Dupixent  prefilled syringe sample. She will try using prefilled syringe for next dose, then alert pharmacy team if she would like to proceed with getting prefilled syringe through insurance.  Continue all other medications as prescribed.  All questions encouraged and answered.  Instructed patient to reach out with any further questions or concerns.  Thank you for allowing pharmacy to participate in this patient's care.  This appointment required 30 minutes of patient care (this includes precharting, chart review, review of results, face-to-face care, etc.).

## 2024-01-03 ENCOUNTER — Other Ambulatory Visit: Payer: Self-pay

## 2024-01-03 ENCOUNTER — Ambulatory Visit: Attending: Nurse Practitioner

## 2024-01-03 DIAGNOSIS — R1310 Dysphagia, unspecified: Secondary | ICD-10-CM | POA: Insufficient documentation

## 2024-01-03 DIAGNOSIS — R498 Other voice and resonance disorders: Secondary | ICD-10-CM | POA: Diagnosis not present

## 2024-01-03 DIAGNOSIS — J383 Other diseases of vocal cords: Secondary | ICD-10-CM | POA: Diagnosis not present

## 2024-01-03 NOTE — Therapy (Signed)
 OUTPATIENT SPEECH LANGUAGE PATHOLOGY VOICE EVALUATION   Patient Name: Dana Mccoy MRN: [id number] DOB:1956-02-04, 68 y.o., female Today's Date: 01/03/2024  PCP: Geofm Hastings, MD REFERRING PROVIDER: Malachy Crank, MD  END OF SESSION:  End of Session - 01/03/24 1502     Visit Number 1    Number of Visits 13    Date for Recertification  03/22/24   due to pt scheduling needs   SLP Start Time 1024   7 mintues late checking in   SLP Stop Time  1102    SLP Time Calculation (min) 38 min    Activity Tolerance Patient tolerated treatment well          Past Medical History:  Diagnosis Date   Anemia    Angio-edema    Anxiety    Asthma due to environmental allergies    Asthma exacerbation, mild 02/12/2020   Back pain    Cervical stenosis of spine    Constipation    Dysphonia    Frequent headaches    GERD (gastroesophageal reflux disease)    Hashimoto's disease    Hyperlipidemia    Hypertension    IBS (irritable bowel syndrome)    Joint pain    Lactose intolerance    Metabolic syndrome    Multiple food allergies    Obesity    Osteoarthritis    Osteopenia    Other fatigue    Palpitations    Periodontal disease    Prediabetes    Reactive airway disease    post CAP   Shortness of breath    Shortness of breath on exertion    Swallowing difficulty    Past Surgical History:  Procedure Laterality Date   CARDIAC CATHETERIZATION  1997   negative   COLONOSCOPY  2007   Dr Donnald   DILATION AND CURETTAGE OF UTERUS     UPPER GI ENDOSCOPY     X 2   WISDOM TOOTH EXTRACTION     Patient Active Problem List   Diagnosis Date Noted   Mild obstructive sleep apnea in adult 12/28/2023   Vitamin D  deficiency 12/28/2023   Soft tissue swelling of chest wall 12/20/2023   Orthostatic dizziness 11/07/2023   Hypothyroidism due to Hashimoto thyroiditis 10/11/2023   Thyroid  nodule 10/11/2023   Osteopenia 10/11/2023   SOB (shortness of breath) 10/11/2023   Vocal cord  dysfunction 10/11/2023   Soreness of tongue 11/10/2021   Upper respiratory tract infection 05/26/2021   Prediabetes 04/20/2021   Hypertension 04/20/2021   Other irritable bowel syndrome 04/20/2021   Severe asthma without complication 02/12/2020   Adverse food reaction 01/30/2020   Angio-edema 01/17/2020   Frequent headaches 01/17/2020   DDD (degenerative disc disease), cervical 09/24/2019   Numbness and tingling 09/06/2019   Tremor 09/06/2019   Restless leg syndrome 07/27/2018   Excessive daytime sleepiness 08/21/2017   Bradycardia 08/21/2017   Obesity (BMI 30-39.9) 08/02/2013   Other allergic rhinitis 08/02/2013   Cervical stenosis of spine 04/26/2011   Hyperlipidemia 06/05/2007   Reactive airway disease 06/05/2007   Gastroesophageal reflux disease 06/05/2007    Onset date: >12 months ago; script 11/07/23  REFERRING DIAG: J38.3 (ICD-10-CM) - Vocal cord dysfunction R13.10 (ICD-10-CM) - Dysphagia, unspecified type  THERAPY DIAG:  Other voice and resonance disorders  Rationale for Evaluation and Treatment: Rehabilitation  SUBJECTIVE:   SUBJECTIVE STATEMENT: Can this also affect a cough during swallowing? Pt endorses coughing with certain types of foods.  Pt accompanied by: self  PERTINENT HISTORY:  DR.  COBB - 11/07/23 Discussed the use of AI scribe software for clinical note transcription with the patient, who gave verbal consent to proceed.   History of Present Illness Dana Mccoy is a 68 year old female who presents with excessive daytime fatigue. She was referred by her primary doctor for a sleep study due to excessive daytime fatigue.   She experiences excessive daytime fatigue despite being on levothyroxine  for about a year, which has made some difference but not resolved the issue. Her TSH levels have normalized. A sleep study five years ago was negative for sleep apnea. There have been no major changes in her weight since. No drowsy driving, but she avoids  long trips alone. She occasionally wakes up gasping and has been told she snores in the past. No sleepwalking or sleep paralysis, but sometimes wakes up feeling disoriented. She wakes up multiple times a night, varying from three to five times, and sometimes has trouble falling asleep. She takes 5 mg of melatonin, which she feels helps. She experiences restless legs occasionally but has never taken medication for it. Doesn't feel like it causes significant problems. Occasional morning headaches.    She has a history of asthma and VCD. She sees Dr. Meade for follow up next month. She has noticed more frequent and severe asthma problems in the last couple of years. She's been stable since her last visit. She also reports having a hoarse voice chronically, which led to an ENT consultation where vocal cord dysfunction was noted. She briefly saw speech therapy when she lived in [place]. She occasionally has issues with swallowing and feels like it goes down the wrong way. She has not had any recent pneumonias or recurrent infections. She had an upper endoscopy last year which showed gastric polyps but no other significant findings. No changes in bowel habits, weight loss, bloody stools, abdominal pain. No fevers, hemoptysis.    She has a history of a low resting heart rate in the low fifties, which she considers problematic. She sometimes experiences dizziness or lightheadedness with changes in position. She's seeing a cardiologist regarding this. No chest pains, syncope.   PAIN:  Are you having pain? No  FALLS: Has patient fallen in last 6 months? No  LIVING ENVIRONMENT: Lives with: lives with their spouse Lives in: House/apartment  PLOF:Level of assistance: Independent with ADLs, Independent with IADLs Employment: Retired  PATIENT GOALS: Decrease frequency of cough  OBJECTIVE:  Note: Objective measures were completed at Evaluation unless otherwise noted.  COGNITION: Overall cognitive status: Within  functional limits for tasks assessed  SOCIAL HISTORY: Occupation: Retired Counsellor intake: optimal and suboptimal - strives for 64 oz/day Caffeine/alcohol intake: moderate 100mg /day Daily voice use: moderate  PERCEPTUAL VOICE ASSESSMENT: Voice quality: harsh and rough Vocal abuse: abnormal breathing pattern Resonance: normal Respiratory function: thoracic breathing  OBJECTIVE VOICE ASSESSMENT: Maximum phonation time for sustained ah: 12.5 seconds (below WNL) Conversational pitch average: 183 Hz (WNL) Conversational pitch range: 83-347 Hz Conversational loudness average: 73 dB (high-WNL) Conversational loudness range: 53-89 dB S/z ratio: 1,22 (Suggestive of dysfunction >1.0)  PATIENT REPORTED OUTCOME MEASURES (PROM): [place] Cough Questionnaire and VCD questionnairre: provided today and asked to return next session  TREATMENT DATE:   01/03/24: Needs cough suppression strategies, visualization, and AB next session. SLP educated pt about VCD as well as glottic insufficiency. Explained PhoRTE eventually, and also worked with pt re: generating a visual image of relaxation for pt to use when she feels sx VCD begin. Lastly SLP taught pt 'sniff sniff - easy exhale with pursed lips' strategy for cough suppression.   PATIENT EDUCATION: Education details: see Treatment date Person educated: Patient Education method: Explanation, Demonstration, Verbal cues, and Handouts Education comprehension: verbalized understanding, returned demonstration, verbal cues required, and needs further education  HOME EXERCISE PROGRAM: Eventually PhoRTE and AB, and visualization, and cough suppression   GOALS: Goals reviewed with patient? Yes  SHORT TERM GOALS: Target date: 02/09/24 (due to pt scheduling needs)  Pt will tell SLP of two breathing strategies , and two relaxation  strategies to use for VCD in two sessions Baseline: Goal status: INITIAL  2.  Pt will decr caffeine intake to 50mg  to improve vocal hygiene Baseline:  Goal status: INITIAL  3.  Pt will decr throat clears to < 3  Baseline:  Goal status: INITIAL  4.  Pt will tell SLP 4 throat clear alternatives in 2 sessions Baseline:  Goal status: INITIAL  5.  Pt will perform PhoRTE with rare min A in two sessions Baseline:  Goal status: INITIAL   LONG TERM GOALS: Target date: 03/22/24  Pt will improve PROMs Baseline:  Goal status: INITIAL  2.  Pt will decr throat clears to <2 in two sessions Baseline:  Goal status: INITIAL  3.  Pt will decr caffeine intake to 30mg  to improve vocal hygiene Baseline:  Goal status: INITIAL  4.  Pt will perform PhoRTE with modified independence in two sessions Baseline:  Goal status: INITIAL  5.  Pt will report improvement as less frequent s/sx VCD than prior to ST Baseline:  Goal status: INITIAL  ASSESSMENT:  CLINICAL IMPRESSION: Patient is a 68 y.o. F who was seen today for assessment of voice in light of both glottic insufficiency and VCD. SLP educated pt on these (See treatment date for details). Pt cleared throat 10 times in this 38-minute eval today.    OBJECTIVE IMPAIRMENTS: include voice disorder. These impairments are limiting patient from household responsibilities, ADLs/IADLs, and effectively communicating at home and in community. Factors affecting potential to achieve goals and functional outcome are time post onset. Patient will benefit from skilled SLP services to address above impairments and improve overall function.  REHAB POTENTIAL: Good  PLAN:  SLP FREQUENCY: 1-2x/week  SLP DURATION: 8 weeks  PLANNED INTERVENTIONS: Environmental controls, Internal/external aids, Oral motor exercises, Functional tasks, SLP instruction and feedback, Compensatory strategies, Patient/family education, [phone] Treatment of speech (30 or 45 min) ,  and voice exercises    Kleber Crean, CCC-SLP 01/03/2024, 3:05 PM

## 2024-01-03 NOTE — Patient Instructions (Signed)
   Think of your relaxing image when you feel like you are going to cough due to scents, voice use, or exercise  Use the sniff-sniff pursed lip exhale for cough cessation.

## 2024-01-05 ENCOUNTER — Ambulatory Visit

## 2024-01-05 VITALS — Ht 65.0 in | Wt 198.0 lb

## 2024-01-05 DIAGNOSIS — Z0000 Encounter for general adult medical examination without abnormal findings: Secondary | ICD-10-CM | POA: Diagnosis not present

## 2024-01-05 NOTE — Progress Notes (Signed)
 Subjective:   Dana Mccoy is a 68 y.o. who presents for a Medicare Wellness preventive visit.  As a reminder, Annual Wellness Visits don't include a physical exam, and some assessments may be limited, especially if this visit is performed virtually. We may recommend an in-person follow-up visit with your provider if needed.  Visit Complete: Virtual I connected with  Dana Mccoy on 01/05/24 by a audio enabled telemedicine application and verified that I am speaking with the correct person using two identifiers.  Patient Location: Home  Provider Location: Office/Clinic  I discussed the limitations of evaluation and management by telemedicine. The patient expressed understanding and agreed to proceed.  Vital Signs: Because this visit was a virtual/telehealth visit, some criteria may be missing or patient reported. Any vitals not documented were not able to be obtained and vitals that have been documented are patient reported.  VideoDeclined- This patient declined Librarian, academic. Therefore the visit was completed with audio only.  Persons Participating in Visit: Patient.  AWV Questionnaire: Yes: Patient Medicare AWV questionnaire was completed by the patient on 01/03/2024; I have confirmed that all information answered by patient is correct and no changes since this date.  Cardiac Risk Factors include: advanced age (>79men, >42 women);dyslipidemia;hypertension;obesity (BMI >30kg/m2)     Objective:    Today's Vitals   01/05/24 1005  Weight: 198 lb (89.8 kg)  Height: 5' 5 (1.651 m)   Body mass index is 32.95 kg/m.     01/05/2024   10:06 AM 01/03/2024    2:59 PM 11/18/2020   12:47 PM 11/04/2019   10:57 AM 05/31/2018    9:04 PM 05/01/2018   12:35 PM  Advanced Directives  Does Patient Have a Medical Advance Directive? No No No No No  No   Would patient like information on creating a medical advance directive? No - Patient declined    No -  Patient declined  No - Patient declined      Data saved with a previous flowsheet row definition    Current Medications (verified) Outpatient Encounter Medications as of 01/05/2024  Medication Sig   acetaminophen (TYLENOL) 500 MG tablet Take 500 mg by mouth as needed.   Albuterol -Budesonide  (AIRSUPRA ) 90-80 MCG/ACT AERO Inhale 2 puffs into the lungs every 4 (four) hours as needed (wheezing, shortness of breath).   aspirin 81 MG chewable tablet Chew 81 mg by mouth daily.   Calcium  Carbonate-Vit D-Min (CALCIUM  600+D PLUS MINERALS) 600-400 MG-UNIT CHEW Chew 1 each by mouth daily.   Dupilumab  (DUPIXENT ) 300 MG/2ML SOAJ Inject 300 mg into the skin every 14 (fourteen) days.   EPINEPHrine  0.3 mg/0.3 mL IJ SOAJ injection Inject 0.3 mg into the muscle as needed for anaphylaxis.   FLUoxetine  (PROZAC ) 20 MG capsule Take 1 capsule (20 mg total) by mouth daily.   fluticasone  (VERAMYST) 27.5 MCG/SPRAY nasal spray Place 2 sprays into the nose daily.   fluticasone -salmeterol (ADVAIR  HFA) 230-21 MCG/ACT inhaler Inhale 2 puffs into the lungs 2 (two) times daily.   hydrochlorothiazide  (MICROZIDE ) 12.5 MG capsule Take 1 capsule (12.5 mg total) by mouth daily.   LACTASE ENZYME PO Take 1 each by mouth as needed.   levothyroxine  (SYNTHROID ) 25 MCG tablet Take 1 tablet daily 4 days a week, take 2 tablets daily 3 days a week.  Take 30 minutes prior to breakfast.   loratadine  (CLARITIN ) 10 MG tablet Take 1 tablet (10 mg total) by mouth 2 (two) times daily.   losartan  (COZAAR ) 25  MG tablet Take 1 tablet (25 mg total) by mouth daily.   mometasone  (ELOCON ) 0.1 % cream APPLY A THIN LAYER TO THE AFFECTED AREA OF THE SKIN DAILY   montelukast  (SINGULAIR ) 10 MG tablet Take 1 tablet (10 mg total) by mouth at bedtime.   Multiple Vitamin (MULTIVITAMIN) tablet Take 1 tablet by mouth daily.   pantoprazole  (PROTONIX ) 40 MG tablet Take 1 tablet (40 mg total) by mouth daily.   rosuvastatin  (CRESTOR ) 5 MG tablet Take 1 tablet (5 mg  total) by mouth daily.   sucralfate  (CARAFATE ) 1 g tablet Take by mouth 4 (four) times daily as needed.   Tiotropium Bromide  Monohydrate (SPIRIVA  RESPIMAT) 1.25 MCG/ACT AERS Inhale 2 puffs into the lungs daily.   valACYclovir  (VALTREX ) 500 MG tablet Take 500 mg by mouth 2 (two) times daily. (Patient taking differently: Take 500 mg by mouth daily as needed.)   Vitamin D , Ergocalciferol , (DRISDOL ) 1.25 MG (50000 UNIT) CAPS capsule Take 1 capsule (50,000 Units total) by mouth every 7 (seven) days.   [DISCONTINUED] omeprazole  (PRILOSEC) 40 MG capsule TAKE 1 CAPSULE BY MOUTH ONCE DAILY   No facility-administered encounter medications on file as of 01/05/2024.    Allergies (verified) Penicillins, Lipitor [atorvastatin ], and Statins   History: Past Medical History:  Diagnosis Date   Anemia    Angio-edema    Anxiety    Asthma due to environmental allergies    Asthma exacerbation, mild 02/12/2020   Back pain    Cervical stenosis of spine    Constipation    Dysphonia    Frequent headaches    GERD (gastroesophageal reflux disease)    Hashimoto's disease    Hyperlipidemia    Hypertension    IBS (irritable bowel syndrome)    Joint pain    Lactose intolerance    Metabolic syndrome    Multiple food allergies    Obesity    Osteoarthritis    Osteopenia    Other fatigue    Palpitations    Periodontal disease    Prediabetes    Reactive airway disease    post CAP   Shortness of breath    Shortness of breath on exertion    Swallowing difficulty    Past Surgical History:  Procedure Laterality Date   CARDIAC CATHETERIZATION  1997   negative   COLONOSCOPY  2007   Dr Donnald   DILATION AND CURETTAGE OF UTERUS     UPPER GI ENDOSCOPY     X 2   WISDOM TOOTH EXTRACTION     Family History  Adopted: Yes  Problem Relation Age of Onset   Cancer Mother    Allergic rhinitis Mother    Anxiety disorder Mother    Asthma Neg Hx    Eczema Neg Hx    Urticaria Neg Hx    Social History    Socioeconomic History   Marital status: Single    Spouse name: Not on file   Number of children: Not on file   Years of education: Not on file   Highest education level: Bachelor's degree (e.g., BA, AB, BS)  Occupational History   Occupation: Copywriter, advertising: [employer]    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  Tobacco Use   Smoking status: Former    Current packs/day: 0.00    Average packs/day: 1.5 packs/day for 12.0 years (18.0 ttl pk-yrs)    Types: Cigarettes    Start date: 03/14/1973    Quit date: 03/14/1985    Years since  quitting: 38.8   Smokeless tobacco: Never   Tobacco comments:    smoked 1975-1986, up to 1.5 ppd  Vaping Use   Vaping status: Never Used  Substance and Sexual Activity   Alcohol use: No    Comment: very rarely   Drug use: No   Sexual activity: Not Currently  Other Topics Concern   Not on file  Social History Narrative   Exercise: 2-4 times a week   Social Drivers of Health   Financial Resource Strain: Low Risk  (01/05/2024)   Overall Financial Resource Strain (CARDIA)    Difficulty of Paying Living Expenses: Not very hard  Food Insecurity: Food Insecurity Present (01/05/2024)   Hunger Vital Sign    Worried About Running Out of Food in the Last Year: Sometimes true    Ran Out of Food in the Last Year: Never true  Transportation Needs: No Transportation Needs (01/05/2024)   PRAPARE - Administrator, Civil Service (Medical): No    Lack of Transportation (Non-Medical): No  Physical Activity: Sufficiently Active (01/05/2024)   Exercise Vital Sign    Days of Exercise per Week: 6 days    Minutes of Exercise per Session: 40 min  Recent Concern: Physical Activity - Insufficiently Active (10/10/2023)   Exercise Vital Sign    Days of Exercise per Week: 4 days    Minutes of Exercise per Session: 30 min  Stress: No Stress Concern Present (01/05/2024)   [employer] of Occupational Health - Occupational Stress Questionnaire    Feeling  of Stress: Not at all  Recent Concern: Stress - Stress Concern Present (10/10/2023)   [employer] of Occupational Health - Occupational Stress Questionnaire    Feeling of Stress: To some extent  Social Connections: Moderately Integrated (01/05/2024)   Social Connection and Isolation Panel    Frequency of Communication with Friends and Family: Three times a week    Frequency of Social Gatherings with Friends and Family: Once a week    Attends Religious Services: More than 4 times per year    Active Member of [employer] or Organizations: Yes    Attends Engineer, structural: More than 4 times per year    Marital Status: Divorced  Recent Concern: Social Connections - Moderately Isolated (10/10/2023)   Social Connection and Isolation Panel    Frequency of Communication with Friends and Family: Twice a week    Frequency of Social Gatherings with Friends and Family: Twice a week    Attends Religious Services: More than 4 times per year    Active Member of [employer] or Organizations: No    Attends Engineer, structural: Not on file    Marital Status: Divorced    Tobacco Counseling Counseling given: Not Answered Tobacco comments: smoked 1975-1986, up to 1.5 ppd    Clinical Intake:  Pre-visit preparation completed: Yes  Pain : No/denies pain     BMI - recorded: 32.95 Nutritional Status: BMI > 30  Obese Nutritional Risks: None Diabetes: No  Lab Results  Component Value Date   HGBA1C 5.8 (H) 11/20/2023   HGBA1C 5.7 (H) 07/19/2021   HGBA1C 5.7 (H) 04/20/2021     How often do you need to have someone help you when you read instructions, pamphlets, or other written materials from your doctor or pharmacy?: 1 - Never  Interpreter Needed?: No  Information entered by :: Dana Mccoy, CMA   Activities of Daily Living     01/05/2024   10:09 AM 01/04/2024  11:06 AM  In your present state of health, do you have any difficulty performing the following activities:   Hearing? 0 0  Vision? 0 0  Difficulty concentrating or making decisions? 0 0  Walking or climbing stairs? 0 0  Dressing or bathing? 0 0  Doing errands, shopping? 0 0  Preparing Food and eating ? N N  Using the Toilet? N N  In the past six months, have you accidently leaked urine? Dana Mccoy Dana Mccoy  Comment wears a pantyliner   Do you have problems with loss of bowel control? N N  Managing your Medications? N N  Managing your Finances? N N  Housekeeping or managing your Housekeeping? N N    Patient Care Team: Geofm Glade PARAS, MD as PCP - General (Internal Medicine) Lonni Slain, MD as PCP - Cardiology (Cardiology) Cleatus Collar, MD as Consulting Physician (Ophthalmology)  I have updated your Care Teams any recent Medical Services you may have received from other providers in the past year.     Assessment:   This is a routine wellness examination for Dana Mccoy.  Hearing/Vision screen Hearing Screening - Comments:: Denies hearing difficulties   Vision Screening - Comments:: Wears rx glasses - up to date with [facility] eye exams [facility]    Goals Addressed               This Visit's Progress     Patient Stated (pt-stated)        Patient stated she plans to lose weight and manage diet/keep exercising       Depression Screen     01/05/2024   10:10 AM 11/20/2023   11:30 AM 11/20/2023   10:07 AM 10/11/2023    1:56 PM 10/11/2023    1:55 PM 11/10/2021   10:28 AM 07/22/2021    4:09 PM  PHQ 2/9 Scores  PHQ - 2 Score 0 0 0 0 0 0 0  PHQ- 9 Score 0 6 6 6  4      Fall Risk     01/05/2024   10:09 AM 01/04/2024   11:06 AM 10/11/2023    1:55 PM 11/10/2021   10:26 AM 07/22/2021    4:09 PM  Fall Risk   Falls in the past year? 0 0 1 0 1  Number falls in past yr: 0 0 1 0 0  Injury with Fall? 0 0 0 0 0  Risk for fall due to : No Fall Risks  No Fall Risks No Fall Risks No Fall Risks  Follow up Falls evaluation completed;Falls prevention discussed  Falls evaluation completed  Falls evaluation completed  Falls evaluation completed      Data saved with a previous flowsheet row definition    MEDICARE RISK AT HOME:  Medicare Risk at Home Any stairs in or around the home?: No If so, are there any without handrails?: No Home free of loose throw rugs in walkways, pet beds, electrical cords, etc?: Yes Adequate lighting in your home to reduce risk of falls?: Yes Life alert?: No Use of a cane, walker or w/c?: No Grab bars in the bathroom?: Yes Shower chair or bench in shower?: No Elevated toilet seat or a handicapped toilet?: Yes  TIMED UP AND GO:  Was the test performed?  No  Cognitive Function: 6CIT completed        01/05/2024   10:14 AM  6CIT Screen  What Year? 0 points  What month? 0 points  What time? 0 points  Count  back from 20 0 points  Months in reverse 0 points  Repeat phrase 0 points  Total Score 0 points    Immunizations Immunization History  Administered Date(s) Administered   INFLUENZA, HIGH DOSE SEASONAL PF 12/10/2020, 02/04/2022, 12/30/2023   Influenza,inj,Quad PF,6+ Mos 11/16/2016   Influenza-Unspecified 12/12/2017   Pfizer Covid-19 Vaccine Bivalent Booster 11yrs & up 12/24/2020   Pfizer(Comirnaty)Fall Seasonal Vaccine 12 years and older 12/08/2022   Pneumococcal Polysaccharide-23 07/31/2012, 01/26/2022   Tdap 07/22/2021    Screening Tests Health Maintenance  Topic Date Due   Zoster Vaccines- Shingrix (1 of 2) Never done   Pneumococcal Vaccine: 50+ Years (2 of 2 - PCV) 01/27/2023   Mammogram  05/12/2023   DEXA SCAN  07/27/2023   COVID-19 Vaccine (3 - 2025-26 season) 11/13/2023   Medicare Annual Wellness (AWV)  01/04/2025   Colonoscopy  12/17/2026   DTaP/Tdap/Td (2 - Td or Tdap) 07/23/2031   Influenza Vaccine  Completed   Hepatitis C Screening  Completed   Meningococcal B Vaccine  Aged Out    Health Maintenance Items Addressed:  01/05/2024  Additional Screening:  Vision Screening: Recommended annual ophthalmology  exams for early detection of glaucoma and other disorders of the eye. Is the patient up to date with their annual eye exam?  Yes  Who is the provider or what is the name of the office in which the patient attends annual eye exams? [facility] - Dr Fleeta  Dental Screening: Recommended annual dental exams for proper oral hygiene  Community Resource Referral / Chronic Care Management: CRR required this visit?  No   CCM required this visit?  No   Plan:    I have personally reviewed and noted the following in the patient's chart:   Medical and social history Use of alcohol, tobacco or illicit drugs  Current medications and supplements including opioid prescriptions. Patient is not currently taking opioid prescriptions. Functional ability and status Nutritional status Physical activity Advanced directives List of other physicians Hospitalizations, surgeries, and ER visits in previous 12 months Vitals Screenings to include cognitive, depression, and falls Referrals and appointments  In addition, I have reviewed and discussed with patient certain preventive protocols, quality metrics, and best practice recommendations. A written personalized care plan for preventive services as well as general preventive health recommendations were provided to patient.   Dana Dana Mccoy, CMA   01/05/2024   After Visit Summary: (MyChart) Due to this being a telephonic visit, the after visit summary with patients personalized plan was offered to patient via MyChart   Notes: Nothing significant to report at this time.

## 2024-01-05 NOTE — Patient Instructions (Signed)
 Dana Mccoy,  Thank you for taking the time for your Medicare Wellness Visit. I appreciate your continued commitment to your health goals. Please review the care plan we discussed, and feel free to reach out if I can assist you further.  Medicare recommends these wellness visits once per year to help you and your care team stay ahead of potential health issues. These visits are designed to focus on prevention, allowing your provider to concentrate on managing your acute and chronic conditions during your regular appointments.  Please note that Annual Wellness Visits do not include a physical exam. Some assessments may be limited, especially if the visit was conducted virtually. If needed, we may recommend a separate in-person follow-up with your provider.  Ongoing Care Seeing your primary care provider every 3 to 6 months helps us  monitor your health and provide consistent, personalized care.   Referrals If a referral was made during today's visit and you haven't received any updates within two weeks, please contact the referred provider directly to check on the status.  Recommended Screenings:  Health Maintenance  Topic Date Due   Zoster (Shingles) Vaccine (1 of 2) Never done   Pneumococcal Vaccine for age over 66 (2 of 2 - PCV) 01/27/2023   Breast Cancer Screening  05/12/2023   DEXA scan (bone density measurement)  07/27/2023   COVID-19 Vaccine (3 - 2025-26 season) 11/13/2023   Medicare Annual Wellness Visit  01/04/2025   Colon Cancer Screening  12/17/2026   DTaP/Tdap/Td vaccine (2 - Td or Tdap) 07/23/2031   Flu Shot  Completed   Hepatitis C Screening  Completed   Meningitis B Vaccine  Aged Out       01/05/2024   10:06 AM  Advanced Directives  Does Patient Have a Medical Advance Directive? No  Would patient like information on creating a medical advance directive? No - Patient declined   Advance Care Planning is important because it: Ensures you receive medical care that  aligns with your values, goals, and preferences. Provides guidance to your family and loved ones, reducing the emotional burden of decision-making during critical moments.  Vision: Annual vision screenings are recommended for early detection of glaucoma, cataracts, and diabetic retinopathy. These exams can also reveal signs of chronic conditions such as diabetes and high blood pressure.  Dental: Annual dental screenings help detect early signs of oral cancer, gum disease, and other conditions linked to overall health, including heart disease and diabetes.

## 2024-01-06 ENCOUNTER — Other Ambulatory Visit (HOSPITAL_BASED_OUTPATIENT_CLINIC_OR_DEPARTMENT_OTHER): Payer: Self-pay

## 2024-01-08 ENCOUNTER — Ambulatory Visit

## 2024-01-08 ENCOUNTER — Emergency Department (HOSPITAL_BASED_OUTPATIENT_CLINIC_OR_DEPARTMENT_OTHER)
Admission: EM | Admit: 2024-01-08 | Discharge: 2024-01-08 | Disposition: A | Discharge status: Home / Self Care | Attending: Emergency Medicine | Admitting: Emergency Medicine

## 2024-01-08 ENCOUNTER — Other Ambulatory Visit (HOSPITAL_BASED_OUTPATIENT_CLINIC_OR_DEPARTMENT_OTHER): Payer: Self-pay

## 2024-01-08 ENCOUNTER — Other Ambulatory Visit: Payer: Self-pay

## 2024-01-08 ENCOUNTER — Encounter (HOSPITAL_BASED_OUTPATIENT_CLINIC_OR_DEPARTMENT_OTHER): Payer: Self-pay | Admitting: Emergency Medicine

## 2024-01-08 DIAGNOSIS — Z7982 Long term (current) use of aspirin: Secondary | ICD-10-CM | POA: Insufficient documentation

## 2024-01-08 DIAGNOSIS — R040 Epistaxis: Secondary | ICD-10-CM | POA: Diagnosis not present

## 2024-01-08 DIAGNOSIS — R498 Other voice and resonance disorders: Secondary | ICD-10-CM | POA: Diagnosis not present

## 2024-01-08 DIAGNOSIS — Z79899 Other long term (current) drug therapy: Secondary | ICD-10-CM | POA: Diagnosis not present

## 2024-01-08 MED ORDER — OXYMETAZOLINE HCL 0.05 % NA SOLN
1.0000 | Freq: Once | NASAL | Status: AC
  Administered 2024-01-08: 1 via NASAL
  Filled 2024-01-08: qty 30

## 2024-01-08 NOTE — Therapy (Addendum)
 OUTPATIENT SPEECH LANGUAGE PATHOLOGY VOICE TREATMENT   Patient Name: Dana Mccoy MRN: [id number] DOB:10-17-1955, 68 y.o., female Today's Date: 01/08/2024  PCP: Geofm Hastings, MD REFERRING PROVIDER: Malachy Crank, MD  END OF SESSION:  End of Session - 01/08/24 1238     Visit Number 2    Number of Visits 13    Date for Recertification  03/22/24   due to pt scheduling needs   SLP Start Time 0934    SLP Stop Time  1015    SLP Time Calculation (min) 41 min    Activity Tolerance Patient tolerated treatment well           Past Medical History:  Diagnosis Date   Anemia    Angio-edema    Anxiety    Asthma due to environmental allergies    Asthma exacerbation, mild 02/12/2020   Back pain    Cervical stenosis of spine    Constipation    Dysphonia    Frequent headaches    GERD (gastroesophageal reflux disease)    Hashimoto's disease    Hyperlipidemia    Hypertension    IBS (irritable bowel syndrome)    Joint pain    Lactose intolerance    Metabolic syndrome    Multiple food allergies    Obesity    Osteoarthritis    Osteopenia    Other fatigue    Palpitations    Periodontal disease    Prediabetes    Reactive airway disease    post CAP   Shortness of breath    Shortness of breath on exertion    Swallowing difficulty    Past Surgical History:  Procedure Laterality Date   CARDIAC CATHETERIZATION  1997   negative   COLONOSCOPY  2007   Dr Donnald   DILATION AND CURETTAGE OF UTERUS     UPPER GI ENDOSCOPY     X 2   WISDOM TOOTH EXTRACTION     Patient Active Problem List   Diagnosis Date Noted   Mild obstructive sleep apnea in adult 12/28/2023   Vitamin D  deficiency 12/28/2023   Soft tissue swelling of chest wall 12/20/2023   Orthostatic dizziness 11/07/2023   Hypothyroidism due to Hashimoto thyroiditis 10/11/2023   Thyroid  nodule 10/11/2023   Osteopenia 10/11/2023   SOB (shortness of breath) 10/11/2023   Vocal cord dysfunction 10/11/2023    Soreness of tongue 11/10/2021   Upper respiratory tract infection 05/26/2021   Prediabetes 04/20/2021   Hypertension 04/20/2021   Other irritable bowel syndrome 04/20/2021   Severe asthma without complication 02/12/2020   Adverse food reaction 01/30/2020   Angio-edema 01/17/2020   Frequent headaches 01/17/2020   DDD (degenerative disc disease), cervical 09/24/2019   Numbness and tingling 09/06/2019   Tremor 09/06/2019   Restless leg syndrome 07/27/2018   Excessive daytime sleepiness 08/21/2017   Bradycardia 08/21/2017   Obesity (BMI 30-39.9) 08/02/2013   Other allergic rhinitis 08/02/2013   Cervical stenosis of spine 04/26/2011   Hyperlipidemia 06/05/2007   Reactive airway disease 06/05/2007   Gastroesophageal reflux disease 06/05/2007    Onset date: >12 months ago; script 11/07/23  REFERRING DIAG: J38.3 (ICD-10-CM) - Vocal cord dysfunction R13.10 (ICD-10-CM) - Dysphagia, unspecified type  THERAPY DIAG:  Other voice and resonance disorders  Rationale for Evaluation and Treatment: Rehabilitation  SUBJECTIVE:   SUBJECTIVE STATEMENT: I actually tried that sniffing thing the other day at [facility].  Pt accompanied by: self  PERTINENT HISTORY:  DR. MALACHY - 11/07/23 Discussed the use of AI scribe software  for clinical note transcription with the patient, who gave verbal consent to proceed.   History of Present Illness Dana Mccoy is a 68 year old female who presents with excessive daytime fatigue. She was referred by her primary doctor for a sleep study due to excessive daytime fatigue.   She experiences excessive daytime fatigue despite being on levothyroxine  for about a year, which has made some difference but not resolved the issue. Her TSH levels have normalized. A sleep study five years ago was negative for sleep apnea. There have been no major changes in her weight since. No drowsy driving, but she avoids long trips alone. She occasionally wakes up gasping and has  been told she snores in the past. No sleepwalking or sleep paralysis, but sometimes wakes up feeling disoriented. She wakes up multiple times a night, varying from three to five times, and sometimes has trouble falling asleep. She takes 5 mg of melatonin, which she feels helps. She experiences restless legs occasionally but has never taken medication for it. Doesn't feel like it causes significant problems. Occasional morning headaches.    She has a history of asthma and VCD. She sees Dr. Meade for follow up next month. She has noticed more frequent and severe asthma problems in the last couple of years. She's been stable since her last visit. She also reports having a hoarse voice chronically, which led to an ENT consultation where vocal cord dysfunction was noted. She briefly saw speech therapy when she lived in [place]. She occasionally has issues with swallowing and feels like it goes down the wrong way. She has not had any recent pneumonias or recurrent infections. She had an upper endoscopy last year which showed gastric polyps but no other significant findings. No changes in bowel habits, weight loss, bloody stools, abdominal pain. No fevers, hemoptysis.    She has a history of a low resting heart rate in the low fifties, which she considers problematic. She sometimes experiences dizziness or lightheadedness with changes in position. She's seeing a cardiologist regarding this. No chest pains, syncope.   PAIN:  Are you having pain? No  FALLS: Has patient fallen in last 6 months? No   PATIENT GOALS: Decrease frequency of cough  OBJECTIVE:  Note: Objective measures were completed at Evaluation unless otherwise noted.   PATIENT REPORTED OUTCOME MEASURES (PROM): [place] Cough Questionnaire (LCQ): pt returned LCQ and scored 85 with "A good bit of the time" (3) was indicated for "bothered by phlegm production during cough, tired because of your cough, embarrassed by your cough, how frequently have  you had coughing bouts, concerned about other people think something is wrong with you, cough has interrupted conversations or phone calls, and cough has annoyed my family or friends." She answered "most of the time (2) for "my cough has made me feel frustrated, and my cough has made me feel fed up."  TREATMENT DATE:  Abdominal breathing=AB  01/08/24: AB and throat clear alternatives next session. Today SLP educated pt about cough suppression strategies, provided handout. Pt to practice approx 5-10 minutes once/twice a day. SLP shared with pt about other visualizations - melting/evaporating, and larynx expanding like a grapefruit. Handout provided. SLP guided pt through brief practice with AB - will continue this next session.   01/03/24: Needs cough suppression strategies, visualization, and AB next session. SLP educated pt about VCD as well as glottic insufficiency. Explained PhoRTE eventually, and also worked with pt re: generating a visual image of relaxation for pt to use when she feels sx VCD begin. Lastly SLP taught pt 'sniff sniff - easy exhale with pursed lips' strategy for cough suppression.   PATIENT EDUCATION: Education details: see Treatment date Person educated: Patient Education method: Explanation, Demonstration, Verbal cues, and Handouts Education comprehension: verbalized understanding, returned demonstration, verbal cues required, and needs further education  HOME EXERCISE PROGRAM: Eventually PhoRTE and AB, and visualization, and cough suppression   GOALS: Goals reviewed with patient? Yes  SHORT TERM GOALS: Target date: 02/09/24 (due to pt scheduling needs)  Pt will tell SLP of two breathing strategies , and two relaxation strategies to use for VCD in two sessions Baseline: Goal status: INITIAL  2.  Pt will decr caffeine intake to 50mg  to improve  vocal hygiene Baseline:  Goal status: INITIAL  3.  Pt will decr throat clears to < 3  Baseline:  Goal status: INITIAL  4.  Pt will tell SLP 4 throat clear alternatives in 2 sessions Baseline:  Goal status: INITIAL  5.  Pt will perform PhoRTE with rare min A in two sessions Baseline:  Goal status: INITIAL   LONG TERM GOALS: Target date: 03/22/24  Pt will improve PROMs Baseline:  Goal status: INITIAL  2.  Pt will decr throat clears to <2 in two sessions Baseline:  Goal status: INITIAL  3.  Pt will decr caffeine intake to 30mg  to improve vocal hygiene Baseline:  Goal status: INITIAL  4.  Pt will perform PhoRTE with modified independence in two sessions Baseline:  Goal status: INITIAL  5.  Pt will report improvement as less frequent s/sx VCD than prior to ST Baseline:  Goal status: INITIAL  ASSESSMENT:  CLINICAL IMPRESSION: Patient is a 68 y.o. F who was seen today for treatment of voice in light of both glottic insufficiency and VCD. SLP educated pt on these (See treatment date for details). Pt cleared throat 10 times in this 38-minute eval today. She will benefit further from ST by working to meet goals above for VCD and for glottal insufficiency.  OBJECTIVE IMPAIRMENTS: include voice disorder. These impairments are limiting patient from household responsibilities, ADLs/IADLs, and effectively communicating at home and in community. Factors affecting potential to achieve goals and functional outcome are time post onset. Patient will benefit from skilled SLP services to address above impairments and improve overall function.  REHAB POTENTIAL: Good  PLAN:  SLP FREQUENCY: 1-2x/week  SLP DURATION: 8 weeks  PLANNED INTERVENTIONS: Environmental controls, Internal/external aids, Oral motor exercises, Functional tasks, SLP instruction and feedback, Compensatory strategies, Patient/family education, [phone] Treatment of speech (30 or 45 min) , and voice  exercises    Mahoganie Basher, CCC-SLP 01/08/2024, 12:38 PM

## 2024-01-08 NOTE — ED Notes (Signed)
Discharge instructions and follow up care reviewed and explained, pt verbalized understanding and had no further questions on d/c.

## 2024-01-08 NOTE — ED Provider Notes (Signed)
 [place] EMERGENCY DEPARTMENT AT [facility] Provider Note   CSN: [id number] Arrival date & time: 01/08/24  1032     Patient presents with: Epistaxis   Dana Mccoy is a 68 y.o. female.   HPI   68 year old female presents emergency department with nosebleed.  Patient states that it started this morning after blowing her nose.  Has been ongoing approximately 30 minutes prior to arrival, describes the blood is bright red with some clots.  She is on daily aspirin but no other anticoagulation.  Denies any symptoms of acute anemia.  Prior to Admission medications   Medication Sig Start Date End Date Taking? Authorizing Provider  acetaminophen (TYLENOL) 500 MG tablet Take 500 mg by mouth as needed.    [provider]  Albuterol -Budesonide  (AIRSUPRA ) 90-80 MCG/ACT AERO Inhale 2 puffs into the lungs every 4 (four) hours as needed (wheezing, shortness of breath). 09/27/23   Desai, Nikita S, MD  aspirin 81 MG chewable tablet Chew 81 mg by mouth daily.    [provider]  Calcium  Carbonate-Vit D-Min (CALCIUM  600+D PLUS MINERALS) 600-400 MG-UNIT CHEW Chew 1 each by mouth daily.    [provider]  Dupilumab  (DUPIXENT ) 300 MG/2ML SOAJ Inject 300 mg into the skin every 14 (fourteen) days. 10/03/23   Gajera, Devki S, RPH-CPP  EPINEPHrine  0.3 mg/0.3 mL IJ SOAJ injection Inject 0.3 mg into the muscle as needed for anaphylaxis. 10/11/23   Geofm Glade PARAS, MD  FLUoxetine  (PROZAC ) 20 MG capsule Take 1 capsule (20 mg total) by mouth daily. 10/11/23   Geofm Glade PARAS, MD  fluticasone  (VERAMYST) 27.5 MCG/SPRAY nasal spray Place 2 sprays into the nose daily.    [provider]  fluticasone -salmeterol (ADVAIR  HFA) 230-21 MCG/ACT inhaler Inhale 2 puffs into the lungs 2 (two) times daily. 09/27/23   Meade Verdon RAMAN, MD  hydrochlorothiazide  (MICROZIDE ) 12.5 MG capsule Take 1 capsule (12.5 mg total) by mouth daily. 10/11/23   Geofm Glade PARAS, MD  LACTASE ENZYME PO Take 1  each by mouth as needed.    [provider]  levothyroxine  (SYNTHROID ) 25 MCG tablet Take 1 tablet daily 4 days a week, take 2 tablets daily 3 days a week.  Take 30 minutes prior to breakfast. 11/30/23   Burns, Glade PARAS, MD  loratadine  (CLARITIN ) 10 MG tablet Take 1 tablet (10 mg total) by mouth 2 (two) times daily. 10/27/20   Iva Marty Saltness, MD  losartan  (COZAAR ) 25 MG tablet Take 1 tablet (25 mg total) by mouth daily. 10/11/23   Geofm Glade PARAS, MD  mometasone  (ELOCON ) 0.1 % cream APPLY A THIN LAYER TO THE AFFECTED AREA OF THE SKIN DAILY 05/25/21   Geofm Glade PARAS, MD  montelukast  (SINGULAIR ) 10 MG tablet Take 1 tablet (10 mg total) by mouth at bedtime. 09/27/23   Desai, Nikita S, MD  Multiple Vitamin (MULTIVITAMIN) tablet Take 1 tablet by mouth daily.    [provider]  pantoprazole  (PROTONIX ) 40 MG tablet Take 1 tablet (40 mg total) by mouth daily. 10/11/23   Geofm Glade PARAS, MD  rosuvastatin  (CRESTOR ) 5 MG tablet Take 1 tablet (5 mg total) by mouth daily. 10/11/23   Geofm Glade PARAS, MD  sucralfate  (CARAFATE ) 1 g tablet Take by mouth 4 (four) times daily as needed.    [provider]  Tiotropium Bromide  (SPIRIVA  RESPIMAT) 1.25 MCG/ACT AERS Inhale 2 puffs into the lungs daily. 09/27/23   Meade Verdon RAMAN, MD  valACYclovir  (VALTREX ) 500 MG tablet Take 500  mg by mouth 2 (two) times daily. Patient taking differently: Take 500 mg by mouth daily as needed.    [provider]  Vitamin D , Ergocalciferol , (DRISDOL ) 1.25 MG (50000 UNIT) CAPS capsule Take 1 capsule (50,000 Units total) by mouth every 7 (seven) days. 12/28/23   Midge Sober, DO  omeprazole  (PRILOSEC) 40 MG capsule TAKE 1 CAPSULE BY MOUTH ONCE DAILY 10/08/19 01/10/20  Geofm Glade PARAS, MD    Allergies: Penicillins, Lipitor [atorvastatin ], and Statins    Review of Systems  Constitutional:  Negative for fever.  HENT:  Positive for nosebleeds.   Respiratory:  Negative for shortness of breath.    Cardiovascular:  Negative for chest pain.  Gastrointestinal:  Positive for nausea.  Skin:  Negative for rash.  Neurological:  Negative for headaches.    Updated Vital Signs BP (!) 158/97   Pulse (!) 57   Temp (!) 97 F (36.1 C) (Axillary)   Resp 16   SpO2 97%   Physical Exam Vitals and nursing note reviewed.  Constitutional:      General: She is not in acute distress.    Appearance: Normal appearance.  HENT:     Head: Normocephalic.     Nose:     Comments: Blood clots in the left nostril with some mild ongoing bright red bleeding in the posterior oropharynx    Mouth/Throat:     Mouth: Mucous membranes are moist.  Cardiovascular:     Rate and Rhythm: Normal rate.  Pulmonary:     Effort: Pulmonary effort is normal. No respiratory distress.  Abdominal:     Palpations: Abdomen is soft.     Tenderness: There is no abdominal tenderness.  Skin:    General: Skin is warm.  Neurological:     Mental Status: She is alert and oriented to person, place, and time. Mental status is at baseline.  Psychiatric:        Mood and Affect: Mood normal.     (all labs ordered are listed, but only abnormal results are displayed) Labs Reviewed - No data to display  EKG: None  Radiology: No results found.   Epistaxis Management  Date/Time: 01/08/2024 1:19 PM  Performed by: Bari Roxie HERO, DO Authorized by: Bari Roxie HERO, DO   Consent:    Consent obtained:  Verbal   Consent given by:  Patient   Risks discussed:  Bleeding and nasal injury   Alternatives discussed:  No treatment Universal protocol:    Patient identity confirmed:  Verbally with patient Anesthesia:    Anesthesia method:  None Procedure details:    Treatment site:  R anterior and R posterior   Treatment method:  Anterior pack   Treatment complexity:  Limited   Treatment episode: initial   Post-procedure details:    Assessment:  Bleeding decreased Comments:     Packed with Afrin soaked cotton balls and  direct pressure    Medications Ordered in the ED  oxymetazoline (AFRIN) 0.05 % nasal spray 1 spray (1 spray Each Nare Given 01/08/24 1143)                                    Medical Decision Making Risk OTC drugs.   68 year old female presents emergency department epistaxis, started 30 minutes prior to arrival after blowing her nose.  Is having bright red blood and some clots.  On physical exam she has clots in the left nostril  with some mild bright red bleeding down the posterior oropharynx.  No acute symptoms of anemia.  I had the patient blow her nose hard, cleared clots, packed the left nostril with Afrin soaked cotton balls and have the patient hold direct pressure for about 20 minutes.  On reevaluation was able to remove the packing.  There is no active bleeding.  There is no visualized anterior bleeding area for cauterization, presume this is posterior.  Patient was monitored for an additional 20 minutes with no rebleed.  Discussed with the patient symptomatic management as an outpatient and following up with ENT for further preventative management.  Patient at this time appears safe and stable for discharge and close outpatient follow up. Discharge plan and strict return to ED precautions discussed, patient verbalizes understanding and agreement.     Final diagnoses:  Epistaxis    ED Discharge Orders     None          Bari Roxie HERO, DO 01/08/24 1339

## 2024-01-08 NOTE — ED Triage Notes (Signed)
 Reports nose started bleeding after blowing nose this morning. Approx 30 mins pta. Denies thinners.

## 2024-01-08 NOTE — Patient Instructions (Addendum)
  VISUALIZATIONS TO USE if you have a coughing event, or when you feel tightness in your throat:  Think of the feeling of tightness or desire to cough like melting away, or evaporating like steam, or your breath on a cold day.  Another visualization is to think of your throat getting bigger and wider, as large as a grapefruit or a softball. Feel that area expand as you visualize this.  Think of your relaxed scene, you sitting in a recliner with your eyes closed.  Cough suppression strategies

## 2024-01-08 NOTE — Discharge Instructions (Signed)
 You have been seen and discharged from the emergency department.  You experienced a left-sided nosebleed.  This was treated with Afrin soaked cotton balls and pressure.  You may perform these therapies at home.  It is recommended that you use saline therapy, either in the spray or gel form.  AYR has a saline gel that is over-the-counter.  Keep the nose moist and avoid any trauma to prevent any rebleeding.  Establish care with ENT for further evaluation and preventative treatment.  Follow-up with your primary provider for further evaluation and further care. Take home medications as prescribed. If you have any worsening symptoms or further concerns for your health please return to an emergency department for further evaluation.

## 2024-01-10 ENCOUNTER — Telehealth (INDEPENDENT_AMBULATORY_CARE_PROVIDER_SITE_OTHER): Admitting: Primary Care

## 2024-01-10 ENCOUNTER — Encounter: Payer: Self-pay | Admitting: Primary Care

## 2024-01-10 ENCOUNTER — Telehealth: Payer: Self-pay | Admitting: Primary Care

## 2024-01-10 VITALS — Ht 65.0 in | Wt 194.0 lb

## 2024-01-10 DIAGNOSIS — G4733 Obstructive sleep apnea (adult) (pediatric): Secondary | ICD-10-CM | POA: Diagnosis not present

## 2024-01-10 DIAGNOSIS — J45909 Unspecified asthma, uncomplicated: Secondary | ICD-10-CM

## 2024-01-10 DIAGNOSIS — L918 Other hypertrophic disorders of the skin: Secondary | ICD-10-CM | POA: Diagnosis not present

## 2024-01-10 DIAGNOSIS — L821 Other seborrheic keratosis: Secondary | ICD-10-CM | POA: Diagnosis not present

## 2024-01-10 DIAGNOSIS — D1801 Hemangioma of skin and subcutaneous tissue: Secondary | ICD-10-CM | POA: Diagnosis not present

## 2024-01-10 DIAGNOSIS — D171 Benign lipomatous neoplasm of skin and subcutaneous tissue of trunk: Secondary | ICD-10-CM | POA: Diagnosis not present

## 2024-01-10 DIAGNOSIS — L814 Other melanin hyperpigmentation: Secondary | ICD-10-CM | POA: Diagnosis not present

## 2024-01-10 NOTE — Progress Notes (Signed)
 Virtual Visit via Video Note  I connected with Dana Mccoy on 01/10/24 at 10:00 AM EDT by a video enabled telemedicine application and verified that I am speaking with the correct person using two identifiers.  Location: Patient: Home Provider: Office   I discussed the limitations of evaluation and management by telemedicine and the availability of in person appointments. The patient expressed understanding and agreed to proceed.  History of Present Illness: Dana Mccoy is a 68 y.o. woman with severe persistent asthma with elevated IgE and sensitivity to aeroallergens on xolair  since early 2023. Switch to dupixent  at last visit.  Allergist - Dr. Iva. Patient of Dr. Meade.   01/10/2024- Review HST Discussed the use of AI scribe software for clinical note transcription with the patient, who gave verbal consent to proceed.  History of Present Illness Dana Mccoy is a 68 year old female with severe persistent asthma who presents for review of her sleep study results.  She has a history of severe persistent asthma and was referred for a sleep study due to excessive daytime fatigue. Despite being on levothyroxine  for a year, which has normalized her thyroid  levels, the fatigue persists. She occasionally wakes up gasping and has been told she snores.  A previous sleep study conducted in 2020 was negative. However, the recent home sleep study revealed mild sleep apnea, with an average of seven apneic events per hour and a total of 53 events during the study. Her lowest recorded oxygen  saturation was 77%, with an average of 94%.  She experiences frequent nocturnal coughing. She is a mouth breather and has issues with teeth grinding, complicating the use of oral appliances for sleep apnea management.  Observations/Objective:  Appears well without overt respiratory symptoms  Assessment and Plan:  1. Mild obstructive sleep apnea in adult (Primary)  Assessment &  Plan Mild obstructive sleep apnea Mild obstructive sleep apnea confirmed by recent sleep study with an average of seven apneic events per hour. Oxygen  saturation dropped to 77% during apneic events, with an average of 94%. Symptoms include daytime fatigue and occasional waking up gasping. CPAP therapy discussed as a treatment option due to symptomatic nature of the condition. She expressed reluctance towards CPAP but agreed to try it, as oral appliance not suitable due to dental issues. CPAP therapy expected to improve sleep quality and reduce daytime fatigue. Compliance with CPAP is crucial for insurance coverage and effectiveness. - Reviewed sleep study with patient, risks of untreated sleep apnea and treatment options - Order CPAP machine with auto settings 5-15cm h20  - Schedule follow-up in 6-8 weeks to assess compliance and effectiveness of CPAP therapy. - Ensure she understands importance of compliance: 70% of nights, more than 4 hours per night. - Educate her on CPAP maintenance: daily cleaning of mask and tubing, weekly cleaning of water chamber, and regular replacement of supplies.   Follow Up Instructions:   6-8 weeks for CPAP compliance  I discussed the assessment and treatment plan with the patient. The patient was provided an opportunity to ask questions and all were answered. The patient agreed with the plan and demonstrated an understanding of the instructions.   The patient was advised to call back or seek an in-person evaluation if the symptoms worsen or if the condition fails to improve as anticipated.  I provided 30 minutes of non-face-to-face time during this encounter.   Almarie LELON Ferrari, NP

## 2024-01-10 NOTE — Patient Instructions (Signed)
  VISIT SUMMARY: Today, we reviewed the results of your recent sleep study. The study confirmed that you have mild obstructive sleep apnea, which is likely contributing to your daytime fatigue and other symptoms. We discussed treatment options and decided to proceed with CPAP therapy.  YOUR PLAN: -MILD OBSTRUCTIVE SLEEP APNEA: Mild obstructive sleep apnea is a condition where your breathing stops and starts during sleep due to blocked airways. This can lead to poor sleep quality and daytime fatigue. We have decided to start you on CPAP therapy, which involves using a machine that helps keep your airways open while you sleep. You will need to use the CPAP machine every night for at least 4 hours on 70% of nights to see improvement and for insurance coverage. You can choose the type of mask that is most comfortable for you with the help of a medical supply store. It's important to clean the mask and tubing daily, clean the water chamber weekly, and replace supplies regularly. We will follow up in 6-8 weeks to see how you are doing with the therapy.  INSTRUCTIONS: Please follow up in 6-8 weeks to assess your compliance and the effectiveness of the CPAP therapy. Make sure to use the CPAP machine every night for at least 4 hours on 70% of nights. Clean the mask and tubing daily, clean the water chamber weekly, and replace supplies regularly.

## 2024-01-10 NOTE — Telephone Encounter (Signed)
 ATC 1x left vm Sent LTR

## 2024-01-10 NOTE — Telephone Encounter (Signed)
 Needs CPAP compliance check in 6-8 weeks with either Beth NP or Katie, can be virtual

## 2024-01-11 ENCOUNTER — Ambulatory Visit

## 2024-01-11 DIAGNOSIS — R498 Other voice and resonance disorders: Secondary | ICD-10-CM

## 2024-01-11 DIAGNOSIS — R040 Epistaxis: Secondary | ICD-10-CM | POA: Diagnosis not present

## 2024-01-11 NOTE — Therapy (Signed)
 OUTPATIENT SPEECH LANGUAGE PATHOLOGY VOICE TREATMENT   Patient Name: Dana Mccoy MRN: [id number] DOB:1956/01/06, 68 y.o., female Today's Date: 01/11/2024  PCP: Geofm Hastings, MD REFERRING PROVIDER: Malachy Crank, MD  END OF SESSION:  End of Session - 01/11/24 1455     Visit Number 3    Number of Visits 13    Date for Recertification  03/22/24   due to pt scheduling needs   SLP Start Time 1451    SLP Stop Time  1530    SLP Time Calculation (min) 39 min    Activity Tolerance Patient tolerated treatment well           Past Medical History:  Diagnosis Date   Anemia    Angio-edema    Anxiety    Asthma due to environmental allergies    Asthma exacerbation, mild 02/12/2020   Back pain    Cervical stenosis of spine    Constipation    Dysphonia    Frequent headaches    GERD (gastroesophageal reflux disease)    Hashimoto's disease    Hyperlipidemia    Hypertension    IBS (irritable bowel syndrome)    Joint pain    Lactose intolerance    Metabolic syndrome    Multiple food allergies    Obesity    Osteoarthritis    Osteopenia    Other fatigue    Palpitations    Periodontal disease    Prediabetes    Reactive airway disease    post CAP   Shortness of breath    Shortness of breath on exertion    Swallowing difficulty    Past Surgical History:  Procedure Laterality Date   CARDIAC CATHETERIZATION  1997   negative   COLONOSCOPY  2007   Dr Donnald   DILATION AND CURETTAGE OF UTERUS     UPPER GI ENDOSCOPY     X 2   WISDOM TOOTH EXTRACTION     Patient Active Problem List   Diagnosis Date Noted   Mild obstructive sleep apnea in adult 12/28/2023   Vitamin D  deficiency 12/28/2023   Soft tissue swelling of chest wall 12/20/2023   Orthostatic dizziness 11/07/2023   Hypothyroidism due to Hashimoto thyroiditis 10/11/2023   Thyroid  nodule 10/11/2023   Osteopenia 10/11/2023   SOB (shortness of breath) 10/11/2023   Vocal cord dysfunction 10/11/2023    Soreness of tongue 11/10/2021   Upper respiratory tract infection 05/26/2021   Prediabetes 04/20/2021   Hypertension 04/20/2021   Other irritable bowel syndrome 04/20/2021   Severe asthma without complication 02/12/2020   Adverse food reaction 01/30/2020   Angio-edema 01/17/2020   Frequent headaches 01/17/2020   DDD (degenerative disc disease), cervical 09/24/2019   Numbness and tingling 09/06/2019   Tremor 09/06/2019   Restless leg syndrome 07/27/2018   Excessive daytime sleepiness 08/21/2017   Bradycardia 08/21/2017   Obesity (BMI 30-39.9) 08/02/2013   Other allergic rhinitis 08/02/2013   Cervical stenosis of spine 04/26/2011   Hyperlipidemia 06/05/2007   Reactive airway disease 06/05/2007   Gastroesophageal reflux disease 06/05/2007    Onset date: >12 months ago; script 11/07/23  REFERRING DIAG: J38.3 (ICD-10-CM) - Vocal cord dysfunction R13.10 (ICD-10-CM) - Dysphagia, unspecified type  THERAPY DIAG:  Other voice and resonance disorders  Rationale for Evaluation and Treatment: Rehabilitation  SUBJECTIVE:   SUBJECTIVE STATEMENT: I actually tried that sniffing thing the other day at [facility].  Pt accompanied by: self  PERTINENT HISTORY:  DR. MALACHY - 11/07/23 Discussed the use of AI scribe software  for clinical note transcription with the patient, who gave verbal consent to proceed.   History of Present Illness Dana Mccoy is a 68 year old female who presents with excessive daytime fatigue. She was referred by her primary doctor for a sleep study due to excessive daytime fatigue.   She experiences excessive daytime fatigue despite being on levothyroxine  for about a year, which has made some difference but not resolved the issue. Her TSH levels have normalized. A sleep study five years ago was negative for sleep apnea. There have been no major changes in her weight since. No drowsy driving, but she avoids long trips alone. She occasionally wakes up gasping and has  been told she snores in the past. No sleepwalking or sleep paralysis, but sometimes wakes up feeling disoriented. She wakes up multiple times a night, varying from three to five times, and sometimes has trouble falling asleep. She takes 5 mg of melatonin, which she feels helps. She experiences restless legs occasionally but has never taken medication for it. Doesn't feel like it causes significant problems. Occasional morning headaches.    She has a history of asthma and VCD. She sees Dr. Meade for follow up next month. She has noticed more frequent and severe asthma problems in the last couple of years. She's been stable since her last visit. She also reports having a hoarse voice chronically, which led to an ENT consultation where vocal cord dysfunction was noted. She briefly saw speech therapy when she lived in [place]. She occasionally has issues with swallowing and feels like it goes down the wrong way. She has not had any recent pneumonias or recurrent infections. She had an upper endoscopy last year which showed gastric polyps but no other significant findings. No changes in bowel habits, weight loss, bloody stools, abdominal pain. No fevers, hemoptysis.    She has a history of a low resting heart rate in the low fifties, which she considers problematic. She sometimes experiences dizziness or lightheadedness with changes in position. She's seeing a cardiologist regarding this. No chest pains, syncope.   PAIN:  Are you having pain? No  FALLS: Has patient fallen in last 6 months? No   PATIENT GOALS: Decrease frequency of cough  OBJECTIVE:  Note: Objective measures were completed at Evaluation unless otherwise noted.   PATIENT REPORTED OUTCOME MEASURES (PROM): [place] Cough Questionnaire (LCQ): pt returned LCQ and scored 85 with "A good bit of the time" (3) was indicated for "bothered by phlegm production during cough, tired because of your cough, embarrassed by your cough, how frequently have  you had coughing bouts, concerned about other people think something is wrong with you, cough has interrupted conversations or phone calls, and cough has annoyed my family or friends." She answered "most of the time (2) for "my cough has made me feel frustrated, and my cough has made me feel fed up."  TREATMENT DATE:  Abdominal breathing=AB  01/11/24: SLP discussion with pt about caffeine intake - pt is having approx 100 mg/daily using a drink mix. SLP suggested pt decr this due to drying effect on vocal folds. Pt already taking Singulair  and Claritin  daily. SLP told pt goal is 50mg  daily. Three throat clears today.  SLP then worked with AB with pt today at rest. Pt with 95% success with demo by SLP initially. SLP told pt to practice this 10-25 minutes BID. SLP introduced pt to throat clear alternatives and provided handout (see pt instructions). SLP used demonstration and pt return demonstrated.   01/08/24: AB and throat clear alternatives next session. Today SLP educated pt about cough suppression strategies, provided handout. Pt to practice approx 5-10 minutes once/twice a day. SLP shared with pt about other visualizations - melting/evaporating, and larynx expanding like a grapefruit. Handout provided. SLP guided pt through brief practice with AB - will continue this next session.   01/03/24: Needs cough suppression strategies, visualization, and AB next session. SLP educated pt about VCD as well as glottic insufficiency. Explained PhoRTE eventually, and also worked with pt re: generating a visual image of relaxation for pt to use when she feels sx VCD begin. Lastly SLP taught pt 'sniff sniff - easy exhale with pursed lips' strategy for cough suppression.   PATIENT EDUCATION: Education details: see Treatment date Person educated: Patient Education method: Explanation,  Demonstration, Verbal cues, and Handouts Education comprehension: verbalized understanding, returned demonstration, verbal cues required, and needs further education  HOME EXERCISE PROGRAM: Eventually PhoRTE and AB, and visualization, and cough suppression   GOALS: Goals reviewed with patient? Yes  SHORT TERM GOALS: Target date: 02/09/24 (due to pt scheduling needs)  Pt will tell SLP of two breathing strategies, and two relaxation strategies to use for VCD in two sessions Baseline: Goal status: INITIAL  2.  Pt will decr caffeine intake to 50mg  to improve vocal hygiene Baseline:  Goal status: INITIAL  3.  Pt will decr throat clears to < 3/ session x 2 sessions Baseline:  Goal status: INITIAL  4.  Pt will tell SLP 4 throat clear alternatives in 2 sessions Baseline:  Goal status: INITIAL  5.  Pt will perform PhoRTE with rare min A in two sessions Baseline:  Goal status: INITIAL   LONG TERM GOALS: Target date: 03/22/24  Pt will improve PROMs Baseline:  Goal status: INITIAL  2.  Pt will decr throat clears to <2 in two sessions Baseline:  Goal status: INITIAL  3.  Pt will decr caffeine intake to 30mg  to improve vocal hygiene Baseline:  Goal status: INITIAL  4.  Pt will perform PhoRTE with modified independence in two sessions Baseline:  Goal status: INITIAL  5.  Pt will report improvement as less frequent s/sx VCD than prior to ST Baseline:  Goal status: INITIAL  6.  Pt will use AB 80% in 10 minutes conversation, in two sessions Baseline:  Goal status: INITIAL  ASSESSMENT:  CLINICAL IMPRESSION: SLP clarified STG #3. Added AB goal as LTG #6. Patient is a 68 y.o. F who was seen today for treatment of voice in light of both glottic insufficiency and VCD. See treatment date above for today's date for further details on today's session.  Pt cleared throat 10 times in this 38-minute eval today. She will benefit further from ST by working to meet goals above for VCD  and for glottal insufficiency.  OBJECTIVE IMPAIRMENTS: include voice disorder. These impairments are limiting patient  from household responsibilities, ADLs/IADLs, and effectively communicating at home and in community. Factors affecting potential to achieve goals and functional outcome are time post onset. Patient will benefit from skilled SLP services to address above impairments and improve overall function.  REHAB POTENTIAL: Good  PLAN:  SLP FREQUENCY: 1-2x/week  SLP DURATION: 8 weeks  PLANNED INTERVENTIONS: Environmental controls, Internal/external aids, Oral motor exercises, Functional tasks, SLP instruction and feedback, Compensatory strategies, Patient/family education, [phone] Treatment of speech (30 or 45 min) , and voice exercises    Teleshia Lemere, CCC-SLP 01/11/2024, 2:55 PM

## 2024-01-11 NOTE — Patient Instructions (Signed)

## 2024-01-11 NOTE — Telephone Encounter (Signed)
 Called Pt to schedule APT Pt stated they will give a call back later.

## 2024-01-12 ENCOUNTER — Other Ambulatory Visit (HOSPITAL_BASED_OUTPATIENT_CLINIC_OR_DEPARTMENT_OTHER): Payer: Self-pay

## 2024-01-12 MED ORDER — COMIRNATY 30 MCG/0.3ML IM SUSY
0.3000 mL | PREFILLED_SYRINGE | Freq: Once | INTRAMUSCULAR | 0 refills | Status: AC
  Filled 2024-01-12: qty 0.3, 1d supply, fill #0

## 2024-01-15 NOTE — Telephone Encounter (Signed)
 Copied from CRM [id number]. Topic: Clinical - Medical Advice >> Jan 15, 2024  3:23 PM Isabell A wrote: Reason for CRM: Patient leaving a message for [facility] - pharmacist, the Dupixent  glass syringe worked much better and she would like to stay with that.

## 2024-01-16 ENCOUNTER — Other Ambulatory Visit: Payer: Self-pay

## 2024-01-16 ENCOUNTER — Telehealth: Payer: Self-pay

## 2024-01-16 ENCOUNTER — Other Ambulatory Visit (HOSPITAL_COMMUNITY): Payer: Self-pay

## 2024-01-16 DIAGNOSIS — J4550 Severe persistent asthma, uncomplicated: Secondary | ICD-10-CM

## 2024-01-16 MED ORDER — DUPIXENT 300 MG/2ML ~~LOC~~ SOSY
300.0000 mg | PREFILLED_SYRINGE | SUBCUTANEOUS | 3 refills | Status: AC
  Filled 2024-01-16 – 2024-01-18 (×2): qty 4, 28d supply, fill #0
  Filled 2024-02-22 – 2024-02-29 (×2): qty 4, 28d supply, fill #1
  Filled 2024-04-01 – 2024-04-03 (×3): qty 4, 28d supply, fill #2

## 2024-01-16 NOTE — Telephone Encounter (Signed)
 Submitted a Prior Authorization request to [employer] for DUPIXENT  SYRINGES via CoverMyMeds. Will update once we receive a response.  Patient switching to syringes due to greater ease of administration. See  01/01/24 clinical support note and 12/27/23 telephone note  Key: [facility]

## 2024-01-16 NOTE — Telephone Encounter (Signed)
 Opening benefits investigation in new encounter for Dupixent  syringes

## 2024-01-16 NOTE — Telephone Encounter (Signed)
 Received notification from [employer] regarding a prior authorization for DUPIXENT . Authorization has been APPROVED from 01/16/24 to 01/15/25. Approval letter sent to scan center.  Per test claim, copay for 28 days supply is $0  Patient can continue to fill through [facility]: [phone]   Authorization # [id number] Phone # [phone]

## 2024-01-16 NOTE — Telephone Encounter (Signed)
 ATC patient to review. LVM with contact number to return call. She has been on Dupixent  and received training on how to inject syringes at our last contact on 01/01/24.   Rx triaged to [facility].

## 2024-01-18 ENCOUNTER — Other Ambulatory Visit: Payer: Self-pay

## 2024-01-18 ENCOUNTER — Ambulatory Visit

## 2024-01-18 ENCOUNTER — Other Ambulatory Visit (HOSPITAL_COMMUNITY): Payer: Self-pay

## 2024-01-19 ENCOUNTER — Other Ambulatory Visit: Payer: Self-pay | Admitting: Surgery

## 2024-01-19 DIAGNOSIS — R222 Localized swelling, mass and lump, trunk: Secondary | ICD-10-CM

## 2024-01-22 ENCOUNTER — Other Ambulatory Visit: Payer: Self-pay

## 2024-01-23 ENCOUNTER — Encounter (INDEPENDENT_AMBULATORY_CARE_PROVIDER_SITE_OTHER): Payer: Self-pay | Admitting: Family Medicine

## 2024-01-23 ENCOUNTER — Other Ambulatory Visit (HOSPITAL_BASED_OUTPATIENT_CLINIC_OR_DEPARTMENT_OTHER): Payer: Self-pay

## 2024-01-23 ENCOUNTER — Ambulatory Visit (INDEPENDENT_AMBULATORY_CARE_PROVIDER_SITE_OTHER): Payer: Self-pay | Admitting: Family Medicine

## 2024-01-23 DIAGNOSIS — Z6832 Body mass index (BMI) 32.0-32.9, adult: Secondary | ICD-10-CM

## 2024-01-23 DIAGNOSIS — E063 Autoimmune thyroiditis: Secondary | ICD-10-CM

## 2024-01-23 DIAGNOSIS — I10 Essential (primary) hypertension: Secondary | ICD-10-CM | POA: Diagnosis not present

## 2024-01-23 DIAGNOSIS — E559 Vitamin D deficiency, unspecified: Secondary | ICD-10-CM | POA: Diagnosis not present

## 2024-01-23 DIAGNOSIS — E669 Obesity, unspecified: Secondary | ICD-10-CM

## 2024-01-23 DIAGNOSIS — R7303 Prediabetes: Secondary | ICD-10-CM

## 2024-01-23 MED ORDER — VITAMIN D (ERGOCALCIFEROL) 1.25 MG (50000 UNIT) PO CAPS
50000.0000 [IU] | ORAL_CAPSULE | ORAL | 0 refills | Status: DC
  Filled 2024-01-23: qty 4, 28d supply, fill #0

## 2024-01-23 NOTE — Progress Notes (Signed)
 Dana DOROTHA Mccoy, D.O.  ABFM, ABOM Specializing in Clinical Bariatric Medicine  Office located at: [address] [place], [place]  [address]      A) FOR THE CHRONIC DISEASE OF OBESITY:  Chief complaint: Obesity Dana Mccoy is here to discuss her progress with her obesity treatment plan.   History of present illness / Interval history:  Dana Mccoy is here today for her follow-up office visit.  Since last OV on 12/28/23, pt is up 1 lb. Patient states that she has a last minute trip to the beach and ate a little more salt than usual.    12/28/23 11:00 01/23/24 14:00   Body Fat % 41.1 % 42.2 %  Muscle Mass (lbs) 108.4 lbs 106.6 lbs  Fat Mass (lbs) 79.6 lbs 81.8 lbs  Total Body Water (lbs) 74.2 lbs 79 lbs  Visceral Fat Rating  12 12    Counseling done on how various foods will affect these numbers and how to maximize success   Total lbs lost to date: - 3 lbs Total Fat Mass in lbs lost to date:  Total weight loss percentage to date: - 1.52 %    Morbid obesity (HCC) Starting BMI 33.8 Obesity (BMI 30-39.9) Current BMI 32.28  Nutrition Therapy She is on CAT 1 MP with B & L options. She also has the option to journal 150-200 calories and 15+ grams protein for breakfast  and states she is following her eating plan approximately 90 % of the time.   - Tracking Calories/Macros: yes  - Eating More Whole Foods: yes  - Adequate Protein Intake: yes  - Adequate Water Intake: no   - Skipping Meals: no  - Sleeping 7-9 Hours/ Night: no   Dana Mccoy is currently in the action stage of change. As such, her goal is to continue weight management plan.  She has agreed to: continue current plan   Physical Activity Dana Mccoy is walking 60  minutes 7 days per week   Dana Mccoy has been advised to work up to 300-450 minutes of moderate intensity aerobic activity a week and strengthening exercises 2-3 times per week for cardiovascular health, weight loss maintenance and preservation of muscle  mass.  She has agreed to : Combine aerobic and strengthening exercises for efficiency and improved cardiometabolic health.   Behavioral Modifications Evidence-based interventions for health behavior change were utilized today including the discussion of   1) SMART goals for next OV:    - Focus on water intake   - Add resistance training 2 days a week for 30 min  Regarding patient's less desirable eating habits and patterns, we employed the technique of small changes.   We discussed the following today: continue to work on maintaining a reduced calorie state, getting the recommended amount of protein, incorporating whole foods, making healthy choices, staying well hydrated and practicing mindfulness when eating. and focusing on food with a 10:1 ratio of calories: grams of protein Additional resources provided today: None   Medical Interventions/ Pharmacotherapy Previous Bariatric surgery: n/a Pharmacotherapy for weight loss: She is not currently taking medications  for medical weight loss.    We discussed various medication options to help Dana Mccoy with her weight loss efforts and we both agreed to : Continue with current nutritional and behavioral strategies   B) OBESITY RELATED CONDITIONS ADDRESSED TODAY:  Prediabetes Assessment & Plan Lab Results  Component Value Date   HGBA1C 5.8 (H) 11/20/2023   HGBA1C 5.7 (H) 07/19/2021   HGBA1C 5.7 (H)  04/20/2021   INSULIN  13.5 11/20/2023   INSULIN  11.4 04/20/2021    Diet and lifestyle controlled. Patient states that she has good control of hunger and cravings. She endorses that there was one day when she did not eat enough protein and noticed an increase in cravings. Overall her hunger and cravings are well controlled. Continue following prudent meal plan and decreasing simple carbs and sugar.     Primary hypertension Assessment & Plan Last 3 blood pressure readings in our office are as follows: BP Readings from Last 3 Encounters:   01/23/24 107/72  01/08/24 (!) 158/97  01/01/24 112/68   The 10-year ASCVD risk score (Arnett DK, et al., 2019) is: 7%  Lab Results  Component Value Date   CREATININE 0.81 11/20/2023   On hydrochlorothiazide  12.5 mg daily and Losartan  25 mg daily. Patients BP is in low normal range today. Patient denies any dizziness or lightheadedness. She endorses checking her BP at home a couple days a week. Continue medications and decreasing foods high in sodium.    Vitamin D  deficiency Assessment & Plan Lab Results  Component Value Date   VD25OH 26.9 (L) 11/20/2023   VD25OH 76.8 07/19/2021   VD25OH 29.2 (L) 04/20/2021   On ERGO 50k units once weekly. Good compliance and tolerance. No acute concerns today. Will refill today. Continue with supplementation. Will obtain labs as medically necessary.    Hypothyroidism due to [facility] thyroiditis Assessment & Plan Lab Results  Component Value Date   TSH 3.870 11/20/2023   FREET4 0.79 (L) 11/20/2023   On Synthroid . On 9/20 patients Synthroid  dose was increased. Patient reports good compliance and tolerance. Patient denies feeling fatigued or heart palpitations. She endorses feeling like this dose is working better than the previous dose. Continue with medications. Will monitor alongside specialist.      Medications Discontinued During This Encounter  Medication Reason   Vitamin D , Ergocalciferol , (DRISDOL ) 1.25 MG (50000 UNIT) CAPS capsule Reorder     Meds ordered this encounter  Medications   Vitamin D , Ergocalciferol , (DRISDOL ) 1.25 MG (50000 UNIT) CAPS capsule    Sig: Take 1 capsule (50,000 Units total) by mouth every 7 (seven) days.    Dispense:  4 capsule    Refill:  0     Follow up:   Return 02/21/2024 at 12:00 PM  She was informed of the importance of frequent follow up visits to maximize her success with intensive lifestyle modifications for her multiple health conditions.   Weight Summary and Biometrics   Weight Lost  Since Last Visit: 0lb  Weight Gained Since Last Visit: 1lb   Vitals Temp: 97.9 F (36.6 C) BP: 107/72 Pulse Rate: 65 SpO2: 95 %   Anthropometric Measurements Height: 5' 5 (1.651 m) Weight: 194 lb (88 kg) BMI (Calculated): 32.28 Weight at Last Visit: 193lb Weight Lost Since Last Visit: 0lb Weight Gained Since Last Visit: 1lb Starting Weight: 197lb Total Weight Loss (lbs): 3 lb (1.361 kg) Peak Weight: 271lb   Body Composition  Body Fat %: 42.2 % Fat Mass (lbs): 81.8 lbs Muscle Mass (lbs): 106.6 lbs Total Body Water (lbs): 79 lbs Visceral Fat Rating : 12   Other Clinical Data Fasting: no Labs: no Today's Visit #: 4 Starting Date: 11/20/23    Objective:   PHYSICAL EXAM: Blood pressure 107/72, pulse 65, temperature 97.9 F (36.6 C), height 5' 5 (1.651 m), weight 194 lb (88 kg), SpO2 95%. Body mass index is 32.28 kg/m.  General: she is overweight, cooperative and  in no acute distress. PSYCH: Has normal mood, affect and thought process.   HEENT: EOMI, sclerae are anicteric. Lungs: Normal breathing effort, no conversational dyspnea. Extremities: Moves * 4 Neurologic: A and O * 3, good insight  DIAGNOSTIC DATA REVIEWED: BMET    Component Value Date/Time   NA 141 11/20/2023 1057   K 4.3 11/20/2023 1057   CL 101 11/20/2023 1057   CO2 24 11/20/2023 1057   GLUCOSE 76 11/20/2023 1057   GLUCOSE 81 11/18/2020 1557   GLUCOSE 77 01/24/2006 1211   BUN 19 11/20/2023 1057   CREATININE 0.81 11/20/2023 1057   CALCIUM  10.1 11/20/2023 1057   GFRNONAA >60 11/18/2020 1557   GFRAA >60 11/10/2019 0032   Lab Results  Component Value Date   HGBA1C 5.8 (H) 11/20/2023   HGBA1C 5.7 01/24/2006   Lab Results  Component Value Date   INSULIN  13.5 11/20/2023   INSULIN  11.4 04/20/2021   Lab Results  Component Value Date   TSH 3.870 11/20/2023   CBC    Component Value Date/Time   WBC 6.9 11/20/2023 1057   WBC 6.0 01/21/2021 1154   RBC 5.07 11/20/2023 1057   RBC  4.74 01/21/2021 1154   HGB 13.3 11/20/2023 1057   HCT 43.1 11/20/2023 1057   PLT 297 11/20/2023 1057   MCV 85 11/20/2023 1057   MCH 26.2 (L) 11/20/2023 1057   MCH 27.8 11/18/2020 1557   MCHC 30.9 (L) 11/20/2023 1057   MCHC 31.9 01/21/2021 1154   RDW 14.1 11/20/2023 1057   Iron Studies    Component Value Date/Time   IRON 62 09/06/2019 1420   TIBC 401 09/06/2019 1420   FERRITIN 16 09/06/2019 1420   IRONPCTSAT 15 (L) 09/06/2019 1420   Lipid Panel     Component Value Date/Time   CHOL 196 11/20/2023 1057   CHOL 260 (H) 09/05/2014 1026   TRIG 198 (H) 11/20/2023 1057   TRIG 161 (H) 09/05/2014 1026   HDL 61 11/20/2023 1057   HDL 59 09/05/2014 1026   CHOLHDL 3.2 11/20/2023 1057   CHOLHDL 5 09/06/2019 1420   VLDL 47.0 (H) 09/06/2019 1420   LDLCALC 101 (H) 11/20/2023 1057   LDLCALC 169 (H) 09/05/2014 1026   LDLDIRECT 150.0 09/06/2019 1420   Hepatic Function Panel     Component Value Date/Time   PROT 7.0 11/20/2023 1057   ALBUMIN 4.5 11/20/2023 1057   AST 22 11/20/2023 1057   ALT 11 11/20/2023 1057   ALKPHOS 128 (H) 11/20/2023 1057   BILITOT 0.5 11/20/2023 1057   BILIDIR 0.0 09/05/2014 1026      Component Value Date/Time   TSH 3.870 11/20/2023 1057   Nutritional Lab Results  Component Value Date   VD25OH 26.9 (L) 11/20/2023   VD25OH 76.8 07/19/2021   VD25OH 29.2 (L) 04/20/2021    Attestations:   LILLETTE Sonny Laroche, acting as a stage manager for Dana Jenkins, DO., have compiled all relevant documentation for today's office visit on behalf of Dana Jenkins, DO, while in the presence of [employer], DO.   I have reviewed the above documentation for accuracy and completeness, and I agree with the above. Dana Mccoy, D.O.  The 21st Century Cures Act was signed into law in 2016 which includes the topic of electronic health records.  This provides immediate access to information in MyChart.  This includes consultation notes, operative notes, office notes,  lab results and pathology reports.  If you have any questions about what you read please let us   know at your next visit so we can discuss your concerns and take corrective action if need be.  We are right here with you.

## 2024-01-24 ENCOUNTER — Ambulatory Visit

## 2024-01-24 ENCOUNTER — Other Ambulatory Visit: Payer: Self-pay

## 2024-01-24 ENCOUNTER — Other Ambulatory Visit: Payer: Self-pay | Admitting: Pharmacy Technician

## 2024-01-24 DIAGNOSIS — R400 Somnolence: Secondary | ICD-10-CM | POA: Diagnosis not present

## 2024-01-24 NOTE — Progress Notes (Signed)
 Specialty Pharmacy Refill Coordination Note  Dana Mccoy is a 68 y.o. female contacted today regarding refills of specialty medication(s) Dupilumab  (Dupixent )   Patient requested [name] at [facility] at [place] date: 01/31/24   Medication will be filled on: 01/30/24

## 2024-01-25 ENCOUNTER — Encounter: Payer: Self-pay | Admitting: Radiology

## 2024-01-25 ENCOUNTER — Ambulatory Visit (INDEPENDENT_AMBULATORY_CARE_PROVIDER_SITE_OTHER): Admitting: Family Medicine

## 2024-01-25 ENCOUNTER — Ambulatory Visit
Admission: RE | Admit: 2024-01-25 | Discharge: 2024-01-25 | Disposition: A | Discharge status: Home / Self Care | Source: Ambulatory Visit | Attending: Surgery | Admitting: Surgery

## 2024-01-25 DIAGNOSIS — R222 Localized swelling, mass and lump, trunk: Secondary | ICD-10-CM

## 2024-01-25 MED ORDER — IOHEXOL 300 MG/ML  SOLN
75.0000 mL | Freq: Once | INTRAMUSCULAR | Status: AC | PRN
  Administered 2024-01-25: 75 mL via INTRAVENOUS

## 2024-01-26 ENCOUNTER — Other Ambulatory Visit (INDEPENDENT_AMBULATORY_CARE_PROVIDER_SITE_OTHER)

## 2024-01-26 ENCOUNTER — Other Ambulatory Visit (HOSPITAL_BASED_OUTPATIENT_CLINIC_OR_DEPARTMENT_OTHER): Payer: Self-pay

## 2024-01-26 ENCOUNTER — Ambulatory Visit

## 2024-01-26 DIAGNOSIS — E063 Autoimmune thyroiditis: Secondary | ICD-10-CM

## 2024-01-26 LAB — TSH: TSH: 1.67 u[IU]/mL (ref 0.35–5.50)

## 2024-01-26 LAB — T4, FREE: Free T4: 0.61 ng/dL (ref 0.60–1.60)

## 2024-01-27 ENCOUNTER — Ambulatory Visit: Payer: Self-pay | Admitting: Internal Medicine

## 2024-01-30 ENCOUNTER — Ambulatory Visit: Attending: Nurse Practitioner

## 2024-01-30 ENCOUNTER — Other Ambulatory Visit: Payer: Self-pay

## 2024-01-30 DIAGNOSIS — R498 Other voice and resonance disorders: Secondary | ICD-10-CM | POA: Insufficient documentation

## 2024-02-01 ENCOUNTER — Ambulatory Visit

## 2024-02-01 ENCOUNTER — Other Ambulatory Visit (HOSPITAL_BASED_OUTPATIENT_CLINIC_OR_DEPARTMENT_OTHER): Payer: Self-pay

## 2024-02-01 DIAGNOSIS — R498 Other voice and resonance disorders: Secondary | ICD-10-CM

## 2024-02-01 NOTE — Therapy (Signed)
 OUTPATIENT SPEECH LANGUAGE PATHOLOGY VOICE TREATMENT   Patient Name: Dana Mccoy MRN: [id number] DOB:06-17-1955, 68 y.o., female Today's Date: 02/01/2024  PCP: Geofm Hastings, MD REFERRING PROVIDER: Malachy Crank, MD  END OF SESSION:  End of Session - 02/01/24 1651     Visit Number 4    Number of Visits 13    Date for Recertification  03/22/24    SLP Start Time 1539    SLP Stop Time  1620    SLP Time Calculation (min) 41 min    Activity Tolerance Patient tolerated treatment well            Past Medical History:  Diagnosis Date   Anemia    Angio-edema    Anxiety    Asthma due to environmental allergies    Asthma exacerbation, mild 02/12/2020   Back pain    Cervical stenosis of spine    Constipation    Dysphonia    Frequent headaches    GERD (gastroesophageal reflux disease)    Hashimoto's disease    Hyperlipidemia    Hypertension    IBS (irritable bowel syndrome)    Joint pain    Lactose intolerance    Metabolic syndrome    Multiple food allergies    Obesity    Osteoarthritis    Osteopenia    Other fatigue    Palpitations    Periodontal disease    Prediabetes    Reactive airway disease    post CAP   Shortness of breath    Shortness of breath on exertion    Swallowing difficulty    Past Surgical History:  Procedure Laterality Date   CARDIAC CATHETERIZATION  1997   negative   COLONOSCOPY  2007   Dr Donnald   DILATION AND CURETTAGE OF UTERUS     UPPER GI ENDOSCOPY     X 2   WISDOM TOOTH EXTRACTION     Patient Active Problem List   Diagnosis Date Noted   Mild obstructive sleep apnea in adult 12/28/2023   Vitamin D  deficiency 12/28/2023   Soft tissue swelling of chest wall 12/20/2023   Orthostatic dizziness 11/07/2023   Hypothyroidism due to Hashimoto thyroiditis 10/11/2023   Thyroid  nodule 10/11/2023   Osteopenia 10/11/2023   SOB (shortness of breath) 10/11/2023   Vocal cord dysfunction 10/11/2023   Soreness of tongue 11/10/2021    Upper respiratory tract infection 05/26/2021   Prediabetes 04/20/2021   Hypertension 04/20/2021   Other irritable bowel syndrome 04/20/2021   Severe asthma without complication 02/12/2020   Adverse food reaction 01/30/2020   Angio-edema 01/17/2020   Frequent headaches 01/17/2020   DDD (degenerative disc disease), cervical 09/24/2019   Numbness and tingling 09/06/2019   Tremor 09/06/2019   Restless leg syndrome 07/27/2018   Excessive daytime sleepiness 08/21/2017   Bradycardia 08/21/2017   Obesity (BMI 30-39.9) 08/02/2013   Other allergic rhinitis 08/02/2013   Cervical stenosis of spine 04/26/2011   Hyperlipidemia 06/05/2007   Reactive airway disease 06/05/2007   Gastroesophageal reflux disease 06/05/2007    Onset date: >12 months ago; script 11/07/23  REFERRING DIAG: J38.3 (ICD-10-CM) - Vocal cord dysfunction R13.10 (ICD-10-CM) - Dysphagia, unspecified type  THERAPY DIAG:  Other voice and resonance disorders  Rationale for Evaluation and Treatment: Rehabilitation  SUBJECTIVE:   SUBJECTIVE STATEMENT: I think I'm not going to be able to breathe (when I start a coughing spell).  Pt accompanied by: self  PERTINENT HISTORY:  DR. MALACHY - 11/07/23 Discussed the use of AI scribe software  for clinical note transcription with the patient, who gave verbal consent to proceed.   History of Present Illness Dana Mccoy is a 68 year old female who presents with excessive daytime fatigue. She was referred by her primary doctor for a sleep study due to excessive daytime fatigue.   She experiences excessive daytime fatigue despite being on levothyroxine  for about a year, which has made some difference but not resolved the issue. Her TSH levels have normalized. A sleep study five years ago was negative for sleep apnea. There have been no major changes in her weight since. No drowsy driving, but she avoids long trips alone. She occasionally wakes up gasping and has been told she snores  in the past. No sleepwalking or sleep paralysis, but sometimes wakes up feeling disoriented. She wakes up multiple times a night, varying from three to five times, and sometimes has trouble falling asleep. She takes 5 mg of melatonin, which she feels helps. She experiences restless legs occasionally but has never taken medication for it. Doesn't feel like it causes significant problems. Occasional morning headaches.    She has a history of asthma and VCD. She sees Dr. Meade for follow up next month. She has noticed more frequent and severe asthma problems in the last couple of years. She's been stable since her last visit. She also reports having a hoarse voice chronically, which led to an ENT consultation where vocal cord dysfunction was noted. She briefly saw speech therapy when she lived in [place]. She occasionally has issues with swallowing and feels like it goes down the wrong way. She has not had any recent pneumonias or recurrent infections. She had an upper endoscopy last year which showed gastric polyps but no other significant findings. No changes in bowel habits, weight loss, bloody stools, abdominal pain. No fevers, hemoptysis.    She has a history of a low resting heart rate in the low fifties, which she considers problematic. She sometimes experiences dizziness or lightheadedness with changes in position. She's seeing a cardiologist regarding this. No chest pains, syncope.   PAIN:  Are you having pain? No  FALLS: Has patient fallen in last 6 months? No   PATIENT GOALS: Decrease frequency of cough  OBJECTIVE:  Note: Objective measures were completed at Evaluation unless otherwise noted.   PATIENT REPORTED OUTCOME MEASURES (PROM): [place] Cough Questionnaire (LCQ): pt returned LCQ and scored 85 with "A good bit of the time" (3) was indicated for "bothered by phlegm production during cough, tired because of your cough, embarrassed by your cough, how frequently have you had coughing bouts,  concerned about other people think something is wrong with you, cough has interrupted conversations or phone calls, and cough has annoyed my family or friends." She answered "most of the time (2) for "my cough has made me feel frustrated, and my cough has made me feel fed up."  TREATMENT DATE:  Abdominal breathing=AB  02/01/24: SLP worked with pt on AB and extending the breath to 4 seconds. Pt with initial cues necessary but independent with this after initial cues. SLP told pt to practice AB with 4 second inhale and 4 second exhale 12-15 minutes and then periodically throughout the day. Reviewed cough suppression strategies with pt and pt practiced with occasional min A necessary from SLP for procedure. SLP also reviewed relaxation/visualization strategies and reiterated these will assist with cough or throat clear due to VCD. 6 throat clears today, but only 2 the last 34 minutes after SLP reviewed throat clear alternatives with pt 7 minutes into session.  01/11/24: SLP discussion with pt about caffeine intake - pt is having approx 100 mg/daily using a drink mix. SLP suggested pt decr this due to drying effect on vocal folds. Pt already taking Singulair  and Claritin  daily. SLP told pt goal is 50mg  daily. Three throat clears today.  SLP then worked with AB with pt today at rest. Pt with 95% success with demo by SLP initially. SLP told pt to practice this 10-25 minutes BID. SLP introduced pt to throat clear alternatives and provided handout (see pt instructions). SLP used demonstration and pt return demonstrated.   01/08/24: AB and throat clear alternatives next session. Today SLP educated pt about cough suppression strategies, provided handout. Pt to practice approx 5-10 minutes once/twice a day. SLP shared with pt about other visualizations - melting/evaporating, and larynx expanding  like a grapefruit. Handout provided. SLP guided pt through brief practice with AB - will continue this next session.   01/03/24: Needs cough suppression strategies, visualization, and AB next session. SLP educated pt about VCD as well as glottic insufficiency. Explained PhoRTE eventually, and also worked with pt re: generating a visual image of relaxation for pt to use when she feels sx VCD begin. Lastly SLP taught pt 'sniff sniff - easy exhale with pursed lips' strategy for cough suppression.   PATIENT EDUCATION: Education details: see Treatment date Person educated: Patient Education method: Explanation, Demonstration, Verbal cues, and Handouts Education comprehension: verbalized understanding, returned demonstration, verbal cues required, and needs further education  HOME EXERCISE PROGRAM: Eventually PhoRTE and AB, and visualization, and cough suppression   GOALS: Goals reviewed with patient? Yes  SHORT TERM GOALS: Target date: 02/09/24 (due to pt scheduling needs)  Pt will tell SLP of two breathing strategies, and two relaxation strategies to use for VCD in two sessions Baseline: Goal status: INITIAL  2.  Pt will decr caffeine intake to 50mg  to improve vocal hygiene Baseline:  Goal status: INITIAL  3.  Pt will decr throat clears to < 3/ session x 2 sessions Baseline:  Goal status: INITIAL  4.  Pt will tell SLP 4 throat clear alternatives in 2 sessions Baseline:  Goal status: INITIAL  5.  Pt will perform PhoRTE with rare min A in two sessions Baseline:  Goal status: INITIAL   LONG TERM GOALS: Target date: 03/22/24  Pt will improve PROMs Baseline:  Goal status: INITIAL  2.  Pt will decr throat clears to <2 in two sessions Baseline:  Goal status: INITIAL  3.  Pt will decr caffeine intake to 30mg  to improve vocal hygiene Baseline:  Goal status: INITIAL  4.  Pt will perform PhoRTE with modified independence in two sessions Baseline:  Goal status: INITIAL  5.   Pt will report improvement as less frequent s/sx VCD than prior to ST Baseline:  Goal status: INITIAL  6.  Pt will use AB 80% in 10 minutes conversation, in two sessions Baseline:  Goal status: INITIAL  ASSESSMENT:  CLINICAL IMPRESSION: Patient is a 68 y.o. F who was seen today for treatment of voice in light of both glottic insufficiency and VCD. See treatment date above for today's date for further details on today's session.  During eval, pt cleared throat 10 times in 38-minutes. She will benefit further from ST by working to meet goals above for VCD and for glottal insufficiency.  OBJECTIVE IMPAIRMENTS: include voice disorder. These impairments are limiting patient from household responsibilities, ADLs/IADLs, and effectively communicating at home and in community. Factors affecting potential to achieve goals and functional outcome are time post onset. Patient will benefit from skilled SLP services to address above impairments and improve overall function.  REHAB POTENTIAL: Good  PLAN:  SLP FREQUENCY: 1-2x/week  SLP DURATION: 8 weeks  PLANNED INTERVENTIONS: Environmental controls, Internal/external aids, Oral motor exercises, Functional tasks, SLP instruction and feedback, Compensatory strategies, Patient/family education, [phone] Treatment of speech (30 or 45 min) , and voice exercises    Dezerae Freiberger, CCC-SLP 02/01/2024, 4:52 PM

## 2024-02-05 ENCOUNTER — Other Ambulatory Visit: Payer: Self-pay

## 2024-02-05 ENCOUNTER — Other Ambulatory Visit (HOSPITAL_BASED_OUTPATIENT_CLINIC_OR_DEPARTMENT_OTHER): Payer: Self-pay

## 2024-02-06 ENCOUNTER — Telehealth: Payer: Self-pay | Admitting: Internal Medicine

## 2024-02-06 ENCOUNTER — Other Ambulatory Visit (HOSPITAL_BASED_OUTPATIENT_CLINIC_OR_DEPARTMENT_OTHER): Payer: Self-pay

## 2024-02-06 DIAGNOSIS — I10 Essential (primary) hypertension: Secondary | ICD-10-CM

## 2024-02-06 DIAGNOSIS — E782 Mixed hyperlipidemia: Secondary | ICD-10-CM

## 2024-02-06 NOTE — Progress Notes (Signed)
 Pharmacy Quality Measure Review  This patient is appearing on a report for being at risk of failing the adherence measure for hypertension (ACEi/ARB) medications this calendar year.   Medication: Losartan  25mg  Last fill date: 07/30 for 90 day supply  This patient is appearing on a report for being at risk of failing the adherence measure for cholesterol (statin) medications this calendar year.   Medication: Rosuvastatin  5mg  Last fill date: 07/30 for 90 day supply  Insurance report was not up to date. No action needed at this time. Spoke to patient, she is taking both every day with no issues. Does not recall the last time she filled it but has sufficient supply and refills. All questions were answered.  Angela Baalmann, PharmD [facility] Pharmacist

## 2024-02-12 ENCOUNTER — Ambulatory Visit: Attending: Nurse Practitioner

## 2024-02-12 DIAGNOSIS — R498 Other voice and resonance disorders: Secondary | ICD-10-CM | POA: Diagnosis present

## 2024-02-12 NOTE — Therapy (Signed)
 OUTPATIENT SPEECH LANGUAGE PATHOLOGY VOICE TREATMENT   Patient Name: Dana Mccoy MRN: [id number] DOB:1955-05-31, 68 y.o., female Today's Date: 02/12/2024  PCP: Geofm Hastings, MD REFERRING PROVIDER: Malachy Crank, MD  END OF SESSION:  End of Session - 02/12/24 1328     Visit Number 5    Number of Visits 13    Date for Recertification  03/22/24    SLP Start Time 1327   pt 11 minutes late to check in   SLP Stop Time  1400    SLP Time Calculation (min) 33 min    Activity Tolerance Patient tolerated treatment well            Past Medical History:  Diagnosis Date   Anemia    Angio-edema    Anxiety    Asthma due to environmental allergies    Asthma exacerbation, mild 02/12/2020   Back pain    Cervical stenosis of spine    Constipation    Dysphonia    Frequent headaches    GERD (gastroesophageal reflux disease)    Hashimoto's disease    Hyperlipidemia    Hypertension    IBS (irritable bowel syndrome)    Joint pain    Lactose intolerance    Metabolic syndrome    Multiple food allergies    Obesity    Osteoarthritis    Osteopenia    Other fatigue    Palpitations    Periodontal disease    Prediabetes    Reactive airway disease    post CAP   Shortness of breath    Shortness of breath on exertion    Swallowing difficulty    Past Surgical History:  Procedure Laterality Date   CARDIAC CATHETERIZATION  1997   negative   COLONOSCOPY  2007   Dr Donnald   DILATION AND CURETTAGE OF UTERUS     UPPER GI ENDOSCOPY     X 2   WISDOM TOOTH EXTRACTION     Patient Active Problem List   Diagnosis Date Noted   Mild obstructive sleep apnea in adult 12/28/2023   Vitamin D  deficiency 12/28/2023   Soft tissue swelling of chest wall 12/20/2023   Orthostatic dizziness 11/07/2023   Hypothyroidism due to Hashimoto thyroiditis 10/11/2023   Thyroid  nodule 10/11/2023   Osteopenia 10/11/2023   SOB (shortness of breath) 10/11/2023   Vocal cord dysfunction 10/11/2023    Soreness of tongue 11/10/2021   Upper respiratory tract infection 05/26/2021   Prediabetes 04/20/2021   Hypertension 04/20/2021   Other irritable bowel syndrome 04/20/2021   Severe asthma without complication 02/12/2020   Adverse food reaction 01/30/2020   Angio-edema 01/17/2020   Frequent headaches 01/17/2020   DDD (degenerative disc disease), cervical 09/24/2019   Numbness and tingling 09/06/2019   Tremor 09/06/2019   Restless leg syndrome 07/27/2018   Excessive daytime sleepiness 08/21/2017   Bradycardia 08/21/2017   Obesity (BMI 30-39.9) 08/02/2013   Other allergic rhinitis 08/02/2013   Cervical stenosis of spine 04/26/2011   Hyperlipidemia 06/05/2007   Reactive airway disease 06/05/2007   Gastroesophageal reflux disease 06/05/2007    Onset date: >12 months ago; script 11/07/23  REFERRING DIAG: J38.3 (ICD-10-CM) - Vocal cord dysfunction R13.10 (ICD-10-CM) - Dysphagia, unspecified type  THERAPY DIAG:  Other voice and resonance disorders  Rationale for Evaluation and Treatment: Rehabilitation  SUBJECTIVE:   SUBJECTIVE STATEMENT: I had kids around all the time. (Reason for not completing AB at recommended frequency)  Pt accompanied by: self  PERTINENT HISTORY:  DR. MALACHY - 11/07/23  Discussed the use of AI scribe software for clinical note transcription with the patient, who gave verbal consent to proceed.   History of Present Illness Dana Mccoy is a 67 year old female who presents with excessive daytime fatigue. She was referred by her primary doctor for a sleep study due to excessive daytime fatigue.   She experiences excessive daytime fatigue despite being on levothyroxine  for about a year, which has made some difference but not resolved the issue. Her TSH levels have normalized. A sleep study five years ago was negative for sleep apnea. There have been no major changes in her weight since. No drowsy driving, but she avoids long trips alone. She occasionally  wakes up gasping and has been told she snores in the past. No sleepwalking or sleep paralysis, but sometimes wakes up feeling disoriented. She wakes up multiple times a night, varying from three to five times, and sometimes has trouble falling asleep. She takes 5 mg of melatonin, which she feels helps. She experiences restless legs occasionally but has never taken medication for it. Doesn't feel like it causes significant problems. Occasional morning headaches.    She has a history of asthma and VCD. She sees Dr. Meade for follow up next month. She has noticed more frequent and severe asthma problems in the last couple of years. She's been stable since her last visit. She also reports having a hoarse voice chronically, which led to an ENT consultation where vocal cord dysfunction was noted. She briefly saw speech therapy when she lived in [place]. She occasionally has issues with swallowing and feels like it goes down the wrong way. She has not had any recent pneumonias or recurrent infections. She had an upper endoscopy last year which showed gastric polyps but no other significant findings. No changes in bowel habits, weight loss, bloody stools, abdominal pain. No fevers, hemoptysis.    She has a history of a low resting heart rate in the low fifties, which she considers problematic. She sometimes experiences dizziness or lightheadedness with changes in position. She's seeing a cardiologist regarding this. No chest pains, syncope.   PAIN:  Are you having pain? No  FALLS: Has patient fallen in last 6 months? No   PATIENT GOALS: Decrease frequency of cough  OBJECTIVE:  Note: Objective measures were completed at Evaluation unless otherwise noted.   PATIENT REPORTED OUTCOME MEASURES (PROM): [place] Cough Questionnaire (LCQ): pt returned LCQ and scored 85 with "A good bit of the time" (3) was indicated for "bothered by phlegm production during cough, tired because of your cough, embarrassed by your  cough, how frequently have you had coughing bouts, concerned about other people think something is wrong with you, cough has interrupted conversations or phone calls, and cough has annoyed my family or friends." She answered "most of the time (2) for "my cough has made me feel frustrated, and my cough has made me feel fed up."  TREATMENT DATE:  Abdominal breathing=AB. Phonatory Resistance Treatment Exercises=PhoRTE  02/12/24: Consider PhoRTE next session. One throat clear today. Pt with inconsistent practice with AB due to family and holiday schedule in the last two weeks. Tucker feels like throat clear/cough has increased in frequency since last session. SLP educated pt (re-educated pt) on triggers and heightened emotion can be one trigger.  SLP encouraged pt to put some time into practicing AB and historically this has helped patients greatly. SLP reviewed visuatlization strategies and laryngeal control strategies with pt. SLP reiterated the use of written cues for each on a 4x6 card for practice and for cues when pt is in a spell. Pt is confident she can find time to practice AB between now and her next appointment. She thought she could do this from now until December 11th (her appointment next week). SLP agreed.  02/01/24: SLP worked with pt on AB and extending the breath to 4 seconds. Pt with initial cues necessary but independent with this after initial cues. SLP told pt to practice AB with 4 second inhale and 4 second exhale 12-15 minutes and then periodically throughout the day. Reviewed cough suppression strategies with pt and pt practiced with occasional min A necessary from SLP for procedure. SLP also reviewed relaxation/visualization strategies and reiterated these will assist with cough or throat clear due to VCD. 6 throat clears today, but only 2 the last 34 minutes after SLP  reviewed throat clear alternatives with pt 7 minutes into session.  01/11/24: SLP discussion with pt about caffeine intake - pt is having approx 100 mg/daily using a drink mix. SLP suggested pt decr this due to drying effect on vocal folds. Pt already taking Singulair  and Claritin  daily. SLP told pt goal is 50mg  daily. Three throat clears today.  SLP then worked with AB with pt today at rest. Pt with 95% success with demo by SLP initially. SLP told pt to practice this 10-25 minutes BID. SLP introduced pt to throat clear alternatives and provided handout (see pt instructions). SLP used demonstration and pt return demonstrated.   01/08/24: AB and throat clear alternatives next session. Today SLP educated pt about cough suppression strategies, provided handout. Pt to practice approx 5-10 minutes once/twice a day. SLP shared with pt about other visualizations - melting/evaporating, and larynx expanding like a grapefruit. Handout provided. SLP guided pt through brief practice with AB - will continue this next session.   01/03/24: Needs cough suppression strategies, visualization, and AB next session. SLP educated pt about VCD as well as glottic insufficiency. Explained PhoRTE eventually, and also worked with pt re: generating a visual image of relaxation for pt to use when she feels sx VCD begin. Lastly SLP taught pt 'sniff sniff - easy exhale with pursed lips' strategy for cough suppression.   PATIENT EDUCATION: Education details: see Treatment date Person educated: Patient Education method: Explanation, Demonstration, Verbal cues, and Handouts Education comprehension: verbalized understanding, returned demonstration, verbal cues required, and needs further education  HOME EXERCISE PROGRAM: Eventually PhoRTE and AB, and visualization, and cough suppression   GOALS: Goals reviewed with patient? Yes  SHORT TERM GOALS: Target date: 02/09/24 (due to pt scheduling needs)  Pt will tell SLP of two  breathing strategies, and two relaxation strategies to use for VCD in two sessions Baseline:02/12/24 Goal status: partially met  2.  Pt will decr caffeine intake to 50mg  to improve vocal hygiene Baseline:  Goal status: not met  3.  Pt will decr throat clears to <  3/ session x 2 sessions Baseline: 02/12/24 Goal status: partially met  4.  Pt will tell SLP 4 throat clear alternatives in 2 sessions Baseline:  Goal status: not met  5.  Pt will perform PhoRTE with rare min A in two sessions Baseline:  Goal status: deferred - not done yet   LONG TERM GOALS: Target date: 03/22/24  Pt will improve PROMs Baseline:  Goal status: INITIAL  2.  Pt will decr throat clears to <2 in two sessions Baseline:  Goal status: INITIAL  3.  Pt will decr caffeine intake to 30mg  to improve vocal hygiene Baseline:  Goal status: INITIAL  4.  Pt will perform PhoRTE with modified independence in two sessions Baseline:  Goal status: INITIAL  5.  Pt will report improvement as less frequent s/sx VCD than prior to ST Baseline:  Goal status: INITIAL  6.  Pt will use AB 80% in 10 minutes conversation, in two sessions Baseline:  Goal status: INITIAL  ASSESSMENT:  CLINICAL IMPRESSION: STGs checked today. Patient is a 68 y.o. F who was seen today for treatment of voice in light of both glottic insufficiency and VCD. See treatment date above for today's date for further details on today's session. During eval, pt cleared throat 10 times in 38-minutes. She will benefit further from ST by working to meet goals above for VCD and for glottal insufficiency.  OBJECTIVE IMPAIRMENTS: include voice disorder. These impairments are limiting patient from household responsibilities, ADLs/IADLs, and effectively communicating at home and in community. Factors affecting potential to achieve goals and functional outcome are time post onset. Patient will benefit from skilled SLP services to address above impairments and  improve overall function.  REHAB POTENTIAL: Good  PLAN:  SLP FREQUENCY: 1-2x/week  SLP DURATION: 8 weeks  PLANNED INTERVENTIONS: Environmental controls, Internal/external aids, Oral motor exercises, Functional tasks, SLP instruction and feedback, Compensatory strategies, Patient/family education, [phone] Treatment of speech (30 or 45 min) , and voice exercises    Adajah Cocking, CCC-SLP 02/12/2024, 1:28 PM

## 2024-02-13 ENCOUNTER — Other Ambulatory Visit (HOSPITAL_BASED_OUTPATIENT_CLINIC_OR_DEPARTMENT_OTHER): Payer: Self-pay

## 2024-02-14 ENCOUNTER — Ambulatory Visit

## 2024-02-16 DIAGNOSIS — H43812 Vitreous degeneration, left eye: Secondary | ICD-10-CM | POA: Diagnosis not present

## 2024-02-19 ENCOUNTER — Other Ambulatory Visit: Payer: Self-pay

## 2024-02-19 ENCOUNTER — Telehealth: Payer: Self-pay

## 2024-02-19 ENCOUNTER — Telehealth: Admitting: Primary Care

## 2024-02-19 ENCOUNTER — Encounter: Payer: Self-pay | Admitting: Primary Care

## 2024-02-19 ENCOUNTER — Ambulatory Visit: Admitting: Primary Care

## 2024-02-19 ENCOUNTER — Other Ambulatory Visit (HOSPITAL_BASED_OUTPATIENT_CLINIC_OR_DEPARTMENT_OTHER): Payer: Self-pay

## 2024-02-19 DIAGNOSIS — G4733 Obstructive sleep apnea (adult) (pediatric): Secondary | ICD-10-CM | POA: Diagnosis not present

## 2024-02-19 DIAGNOSIS — J45909 Unspecified asthma, uncomplicated: Secondary | ICD-10-CM

## 2024-02-19 MED ORDER — ESZOPICLONE 1 MG PO TABS
1.0000 mg | ORAL_TABLET | Freq: Every evening | ORAL | 0 refills | Status: AC | PRN
  Filled 2024-02-19: qty 30, 30d supply, fill #0

## 2024-02-19 NOTE — Telephone Encounter (Signed)
 I called and spoke to pt. Pt agreed to change her appt to a virtual appt due to the weather. This has been fixed. NFN

## 2024-02-19 NOTE — Progress Notes (Signed)
 Virtual Visit via Video Note  I connected with Dana Mccoy on 02/19/24 at  2:30 PM EST by a video enabled telemedicine application and verified that I am speaking with the correct person using two identifiers.  Location: Patient: Home Provider: Office    I discussed the limitations of evaluation and management by telemedicine and the availability of in person appointments. The patient expressed understanding and agreed to proceed.  History of Present Illness: Dana Mccoy is a 68 y.o. woman with severe persistent asthma with elevated IgE and sensitivity to aeroallergens on xolair  since early 2023. Switch to dupixent  at last visit.  Allergist - Dr. Iva. Patient of Dr. Meade.   Previous [facility] pulmonary encounter: 01/10/2024- Review HST Discussed the use of AI scribe software for clinical note transcription with the patient, who gave verbal consent to proceed.  History of Present Illness Dana Mccoy is a 68 year old female with severe persistent asthma who presents for review of her sleep study results.  She has a history of severe persistent asthma and was referred for a sleep study due to excessive daytime fatigue. Despite being on levothyroxine  for a year, which has normalized her thyroid  levels, the fatigue persists. She occasionally wakes up gasping and has been told she snores.  A previous sleep study conducted in 2020 was negative. However, the recent home sleep study revealed mild sleep apnea, with an average of seven apneic events per hour and a total of 53 events during the study. Her lowest recorded oxygen  saturation was 77%, with an average of 94%.  She experiences frequent nocturnal coughing. She is a mouth breather and has issues with teeth grinding, complicating the use of oral appliances for sleep apnea management.   1. Mild obstructive sleep apnea in adult (Primary)  Assessment & Plan Mild obstructive sleep apnea Mild obstructive sleep apnea  confirmed by recent sleep study with an average of seven apneic events per hour. Oxygen  saturation dropped to 77% during apneic events, with an average of 94%. Symptoms include daytime fatigue and occasional waking up gasping. CPAP therapy discussed as a treatment option due to symptomatic nature of the condition. She expressed reluctance towards CPAP but agreed to try it, as oral appliance not suitable due to dental issues. CPAP therapy expected to improve sleep quality and reduce daytime fatigue. Compliance with CPAP is crucial for insurance coverage and effectiveness. - Reviewed sleep study with patient, risks of untreated sleep apnea and treatment options - Order CPAP machine with auto settings 5-15cm h20  - Schedule follow-up in 6-8 weeks to assess compliance and effectiveness of CPAP therapy. - Ensure she understands importance of compliance: 70% of nights, more than 4 hours per night. - Educate her on CPAP maintenance: daily cleaning of mask and tubing, weekly cleaning of water chamber, and regular replacement of supplies.    02/19/2024- CPAP compliance follow-up  Discussed the use of AI scribe software for clinical note transcription with the patient, who gave verbal consent to proceed.  History of Present Illness Dana Mccoy is a 68 year old female with mild sleep apnea who presents for a follow-up visit regarding CPAP compliance and symptom management.  She reports a diagnosis of mild sleep apnea and recalls a sleep study showing an average of seven apneic events per hour, with oxygen  saturation dropping to 77% during these events and an average oxygen  level of 94%. She experiences daytime fatigue and occasionally wakes up gasping for air.  She uses a CPAP machine  inconsistently, about 14 days in the past month, often for only a couple of hours at a time. She finds it challenging to sleep with the CPAP due to face itching and previous issues with condensation causing gurgling noises.  She attempts to wear it for at least four hours, sometimes splitting the time between evening and morning.  She has an adjustable bed that helps her breathe better by elevating her head. She experienced dry air causing coughing fits, which has improved. She is working on getting more comfortable with the CPAP, noting that it is no longer leaking.  Her current medications include Claritin , montelukast , Advair , and Dupixent . She reports less need for her inhaler and a reduction in fatigue, which she attributes to a combination of using the CPAP and her current asthma medications.  She sleeps on her side and stomach and uses a full face mask with the hose at the front.   Observations/Objective:  Appears well without overt respiratory symptoms   Assessment and Plan:  Assessment and Plan Assessment & Plan Obstructive sleep apnea  Mild obstructive sleep apnea with an average of seven apneic events per hour and oxygen  desaturation to 77% during events. CPAP therapy initiated but compliance is low at 47% usage over the last month, with most sessions lasting only a couple of hours. Reports difficulty sleeping with CPAP due to discomfort and itching from the mask. No issues with pressure settings. Improvement in fatigue and reduced need for inhaler use reported. - Prescribed Lunesta  1 mg at bedtime as needed for sleep, with caution regarding potential dependency and side effects such as daytime grogginess and memory changes. - Recommended CPAP liners to alleviate mask-related itching. - Suggested CPAP pillows for side sleepers to improve comfort. - Advised wearing CPAP during the day to desensitize to the mask. 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 - Encouraged consistent CPAP use, aiming for 70% compliance with at least four hours per night. - FU in 4 weeks for compliance check   Asthma Managed with Advair , Dupixent , and  Singulair . Reports decreased need for albuterol  and improved fatigue, though fatigue is not a common asthma symptom. Improvement may be related to CPAP use rather than asthma treatment. - Continue current asthma medications: Advair , Dupixent , and Singulair .  Recording duration: 18 minutes   Follow Up Instructions:    I discussed the assessment and treatment plan with the patient. The patient was provided an opportunity to ask questions and all were answered. The patient agreed with the plan and demonstrated an understanding of the instructions.   The patient was advised to call back or seek an in-person evaluation if the symptoms worsen or if the condition fails to improve as anticipated.  I provided 30 minutes of non-face-to-face time during this encounter.   Dana LELON Ferrari, NP

## 2024-02-21 ENCOUNTER — Other Ambulatory Visit (HOSPITAL_BASED_OUTPATIENT_CLINIC_OR_DEPARTMENT_OTHER): Payer: Self-pay

## 2024-02-21 ENCOUNTER — Encounter (INDEPENDENT_AMBULATORY_CARE_PROVIDER_SITE_OTHER): Payer: Self-pay | Admitting: Family Medicine

## 2024-02-21 ENCOUNTER — Ambulatory Visit (INDEPENDENT_AMBULATORY_CARE_PROVIDER_SITE_OTHER): Admitting: Family Medicine

## 2024-02-21 VITALS — BP 114/74 | HR 61 | Temp 98.1°F | Ht 64.0 in | Wt 191.0 lb

## 2024-02-21 DIAGNOSIS — E559 Vitamin D deficiency, unspecified: Secondary | ICD-10-CM | POA: Diagnosis not present

## 2024-02-21 DIAGNOSIS — F5089 Other specified eating disorder: Secondary | ICD-10-CM

## 2024-02-21 DIAGNOSIS — Z6832 Body mass index (BMI) 32.0-32.9, adult: Secondary | ICD-10-CM | POA: Diagnosis not present

## 2024-02-21 DIAGNOSIS — F39 Unspecified mood [affective] disorder: Secondary | ICD-10-CM

## 2024-02-21 DIAGNOSIS — I10 Essential (primary) hypertension: Secondary | ICD-10-CM | POA: Diagnosis not present

## 2024-02-21 DIAGNOSIS — E669 Obesity, unspecified: Secondary | ICD-10-CM

## 2024-02-21 MED ORDER — VITAMIN D (ERGOCALCIFEROL) 1.25 MG (50000 UNIT) PO CAPS
50000.0000 [IU] | ORAL_CAPSULE | ORAL | 0 refills | Status: DC
  Filled 2024-02-21: qty 4, 28d supply, fill #0

## 2024-02-21 NOTE — Progress Notes (Signed)
 Dana Mccoy, D.O.  ABFM, ABOM Specializing in Clinical Bariatric Medicine  Office located at: [address] [place], [place]  [address]    FOR THE CHRONIC DISEASE OF OBESITY:   Morbid obesity (HCC) Starting BMI 33.8 Obesity (BMI 30-39.9) Current BMI 32.77  Weight Summary and Body Composition Analysis  Weight Lost Since Last Visit: 3 lb  Weight Gained Since Last Visit: 0    Vitals Temp: 98.1 F (36.7 C) BP: 114/74 Pulse Rate: 61 SpO2: 94 %   Anthropometric Measurements Height: 5' 4 (1.626 m) (Height Rechecked today) Weight: 191 lb (86.6 kg) BMI (Calculated): 32.77 Weight at Last Visit: 194 lb Weight Lost Since Last Visit: 3 lb Weight Gained Since Last Visit: 0 Starting Weight: 197 lb Total Weight Loss (lbs): 6 lb (2.722 kg) Peak Weight: 271 lb   Body Composition  Body Fat %: 43.3 % Fat Mass (lbs): 82.8 lbs Muscle Mass (lbs): 102.8 lbs Total Body Water (lbs): 73 lbs Visceral Fat Rating : 13   Other Clinical Data Fasting: No Labs: No Today's Visit #: 5 Starting Date: 11/20/23 Comments: Height Rechecked today    Chief complaint: Obesity  Interval History Dana Mccoy is here for a follow-up office visit to discuss her progress with her obesity treatment plan. She is on the Category 1 Plan with the option to journal 150 - 200 cal and 15+ grams protein for breakfast and states she is following her eating plan approximately 85 % of the time. She is walking 60  minutes 7 days per week; she also does resistance training, but on an inconsistent basis.   She has experienced a weight loss of 3 lbs since last OV on 01/23/2024.   Her dietary and life habits include:  - Tracking Calories/Macros: no  - Eating More Whole Foods: yes  - Adequate Protein Intake: yes  - Adequate Water Intake: no  - Skipping Meals: no  - Sleeping 7-9 Hours/ Night: yes    01/23/24 14:00 02/21/24 12:00   Body Fat % 42.2 % 43.3 %  Muscle Mass (lbs) 106.6 lbs  102.8 lbs  Fat Mass (lbs) 81.8 lbs 82.8 lbs  Total Body Water (lbs) 79 lbs 73 lbs  Visceral Fat Rating  12 13    Counseling done on how various foods will affect these numbers and how to maximize success  Total Fat mass lost to date: - 8.6 lbs Total lbs lost to date:  - 6 lbs Total weight loss percentage to date: -3.05 %   Recommended Dietary Goals [name] is currently in the action stage of change. As such, her goal is to continue weight management plan.  She has agreed to START journaling with following parameters: 950 - 1050 cal and 80+ grams protein.   Nutritional and Behavioral Counseling:  We discussed the following today: increasing lean protein intake to established goals, increasing fiber rich foods, and work on tracking and journaling calories using tracking application  Additional resources provided today: Physician provided patient with handouts and personalized instruction on tracking and journaling using Apps (or how to handwrite in notebook) and using logs provided  and Handout on December Goals  Evidence-based interventions for health behavior change were utilized today including the discussion of self monitoring techniques, problem-solving barriers and SMART goal setting techniques.   Regarding patient's less desirable eating habits and patterns, we employed the technique of small changes.   SMART Goal(s) created today: bring in food log + maintain weight through holidays  Recommended Physical Activity Goals Parlee has been advised to work up to 300-450 minutes of moderate intensity aerobic activity a week and strengthening exercises 2-3 times per week for cardiovascular health, weight loss maintenance and preservation of muscle mass.   She has agreed to: Continue current level of physical activity    Medical Interventions and Pharmacotherapy Previous Bariatric surgery: n/a Pharmacotherapy: n/a   OBESITY RELATED CONDITIONS ADDRESSED TODAY:    Medications  Discontinued During This Encounter  Medication Reason   Calcium  Carbonate-Vit D-Min (CALCIUM  600+D PLUS MINERALS) 600-400 MG-UNIT CHEW Patient Preference   aspirin 81 MG chewable tablet Patient Preference   Vitamin D , Ergocalciferol , (DRISDOL ) 1.25 MG (50000 UNIT) CAPS capsule Reorder     Meds ordered this encounter  Medications   Vitamin D , Ergocalciferol , (DRISDOL ) 1.25 MG (50000 UNIT) CAPS capsule    Sig: Take 1 capsule (50,000 Units total) by mouth every 7 (seven) days.    Dispense:  4 capsule    Refill:  0     Recheck Vit D, A1c, B12, thyroid  function, etc next OV   Primary hypertension Assessment & Plan: BP Readings from Last 3 Encounters:  02/21/24 114/74  01/23/24 107/72  01/08/24 (!) 158/97   BP controlled today. No acute concerns. Cont hydrochlorothiazide  12.5 mg daily and Losartan  25 mg daily. Continue with low sodium heart healthy meal plan.     Mood disorder - emotional eating Assessment & Plan: On Prozac  20 mg daily with reported good compliance and tolerance. Denies any SI/HI. Mood is stable. Cravings and hunger are well controlled. Continue medication, nutritional strategies, and behavioral modification techniques.     Vitamin D  deficiency Assessment & Plan: Lab Results  Component Value Date   VD25OH 26.9 (L) 11/20/2023   VD25OH 76.8 07/19/2021   VD25OH 29.2 (L) 04/20/2021   Pt is doing well on Ergocalciferol  50,000 units every 7 days. Continue wt loss efforts and supplementation (refill today). Recheck as deemed medically necessary.    Objective:   PHYSICAL EXAM: Blood pressure 114/74, pulse 61, temperature 98.1 F (36.7 C), height 5' 4 (1.626 m), weight 191 lb (86.6 kg), SpO2 94%. Body mass index is 32.79 kg/m.  General: she is overweight, cooperative and in no acute distress. PSYCH: Has normal mood, affect and thought process.   HEENT: EOMI, sclerae are anicteric. Lungs: Normal breathing effort, no conversational dyspnea. Extremities:  Moves * 4 Neurologic: A and O * 3, good insight  DIAGNOSTIC DATA REVIEWED: BMET    Component Value Date/Time   NA 141 11/20/2023 1057   K 4.3 11/20/2023 1057   CL 101 11/20/2023 1057   CO2 24 11/20/2023 1057   GLUCOSE 76 11/20/2023 1057   GLUCOSE 81 11/18/2020 1557   GLUCOSE 77 01/24/2006 1211   BUN 19 11/20/2023 1057   CREATININE 0.81 11/20/2023 1057   CALCIUM  10.1 11/20/2023 1057   GFRNONAA >60 11/18/2020 1557   GFRAA >60 11/10/2019 0032   Lab Results  Component Value Date   HGBA1C 5.8 (H) 11/20/2023   HGBA1C 5.7 01/24/2006   Lab Results  Component Value Date   INSULIN  13.5 11/20/2023   INSULIN  11.4 04/20/2021   Lab Results  Component Value Date   TSH 1.67 01/26/2024   CBC    Component Value Date/Time   WBC 6.9 11/20/2023 1057   WBC 6.0 01/21/2021 1154   RBC 5.07 11/20/2023 1057   RBC 4.74 01/21/2021 1154   HGB 13.3 11/20/2023 1057   HCT 43.1 11/20/2023 1057   PLT 297 11/20/2023  1057   MCV 85 11/20/2023 1057   MCH 26.2 (L) 11/20/2023 1057   MCH 27.8 11/18/2020 1557   MCHC 30.9 (L) 11/20/2023 1057   MCHC 31.9 01/21/2021 1154   RDW 14.1 11/20/2023 1057   Iron Studies    Component Value Date/Time   IRON 62 09/06/2019 1420   TIBC 401 09/06/2019 1420   FERRITIN 16 09/06/2019 1420   IRONPCTSAT 15 (L) 09/06/2019 1420   Lipid Panel     Component Value Date/Time   CHOL 196 11/20/2023 1057   CHOL 260 (H) 09/05/2014 1026   TRIG 198 (H) 11/20/2023 1057   TRIG 161 (H) 09/05/2014 1026   HDL 61 11/20/2023 1057   HDL 59 09/05/2014 1026   CHOLHDL 3.2 11/20/2023 1057   CHOLHDL 5 09/06/2019 1420   VLDL 47.0 (H) 09/06/2019 1420   LDLCALC 101 (H) 11/20/2023 1057   LDLCALC 169 (H) 09/05/2014 1026   LDLDIRECT 150.0 09/06/2019 1420   Hepatic Function Panel     Component Value Date/Time   PROT 7.0 11/20/2023 1057   ALBUMIN 4.5 11/20/2023 1057   AST 22 11/20/2023 1057   ALT 11 11/20/2023 1057   ALKPHOS 128 (H) 11/20/2023 1057   BILITOT 0.5 11/20/2023 1057    BILIDIR 0.0 09/05/2014 1026      Component Value Date/Time   TSH 1.67 01/26/2024 1127   Nutritional Lab Results  Component Value Date   VD25OH 26.9 (L) 11/20/2023   VD25OH 76.8 07/19/2021   VD25OH 29.2 (L) 04/20/2021     Follow up:   Return 03/28/2024 at 11:20 AM.  She was informed of the importance of frequent follow up visits to maximize her success with intensive lifestyle modifications for her multiple health conditions.   Attestations:   I, Special Puri, acting as a stage manager for [employer], DO., have compiled all relevant documentation for today's office visit on behalf of Dana Jenkins, DO, while in the presence of [employer], DO.  Pertinent positives were addressed with patient today. Reviewed by clinician on day of visit: allergies, medications, problem list, medical history, surgical history, family history, social history, and previous encounter notes.  I have reviewed the above documentation for accuracy and completeness, and I agree with the above. Dana JINNY Mccoy, D.O.  The 21st Century Cures Act was signed into law in 2016 which includes the topic of electronic health records.  This provides immediate access to information in MyChart. This includes consultation notes, operative notes, office notes, lab results and pathology reports.  If you have any questions about what you read please let us  know at your next visit so we can discuss your concerns and take corrective action if need be.  We are right here with you.

## 2024-02-22 ENCOUNTER — Other Ambulatory Visit (HOSPITAL_BASED_OUTPATIENT_CLINIC_OR_DEPARTMENT_OTHER): Payer: Self-pay

## 2024-02-22 ENCOUNTER — Ambulatory Visit

## 2024-02-22 ENCOUNTER — Other Ambulatory Visit: Payer: Self-pay

## 2024-02-23 ENCOUNTER — Encounter: Payer: Self-pay | Admitting: Diagnostic Neuroimaging

## 2024-02-23 ENCOUNTER — Ambulatory Visit: Admitting: Diagnostic Neuroimaging

## 2024-02-23 ENCOUNTER — Other Ambulatory Visit (HOSPITAL_BASED_OUTPATIENT_CLINIC_OR_DEPARTMENT_OTHER): Payer: Self-pay

## 2024-02-23 VITALS — BP 104/73 | HR 77 | Wt 194.0 lb

## 2024-02-23 DIAGNOSIS — G250 Essential tremor: Secondary | ICD-10-CM

## 2024-02-23 DIAGNOSIS — R400 Somnolence: Secondary | ICD-10-CM | POA: Diagnosis not present

## 2024-02-23 DIAGNOSIS — G43709 Chronic migraine without aura, not intractable, without status migrainosus: Secondary | ICD-10-CM | POA: Diagnosis not present

## 2024-02-23 MED ORDER — RIZATRIPTAN BENZOATE 10 MG PO TBDP
10.0000 mg | ORAL_TABLET | ORAL | 11 refills | Status: AC | PRN
  Filled 2024-02-23: qty 9, 30d supply, fill #0

## 2024-02-23 MED ORDER — TOPIRAMATE 50 MG PO TABS
50.0000 mg | ORAL_TABLET | Freq: Two times a day (BID) | ORAL | 12 refills | Status: DC
  Filled 2024-02-23: qty 60, 30d supply, fill #0

## 2024-02-23 NOTE — Progress Notes (Unsigned)
 [facility]  PATIENT: Dana Mccoy DOB: Aug 23, 1955  REFERRING CLINICIAN: Geofm Glade PARAS, MD HISTORY FROM: patient  REASON FOR VISIT: follow up    HISTORICAL  CHIEF COMPLAINT:  Chief Complaint  Patient presents with   RM 7     Patient is here alone for headaches and tremors - was seen 11/20/2019 by Dr. SHAUNNA for fatigue, headaches and tremors     HISTORY OF PRESENT ILLNESS:   UPDATE (02/23/24, VRP): 68 year old female here for evaluation follow-up of headaches and tremor. Headaches have worsened lately now up to 4-5 times a week. Only using OTC meds.   UPDATE (06/01/20, VRP): Since last visit, doing better. HA and fatigue symptoms are slightly improved. Tylenol OTC helps. Now has new job since Jan 2022, which may be helping. Never tried the topiramate  (was having other allergy issues, so didn't want to change other variables).   PRIOR HPI: 68 year old female here for evaluation of fatigue, headaches and numbness, tremor, word find difficulties.  Patient has had unexplained chronic fatigue for the past 1 year.  Symptoms mainly affect her when she is at home not doing that much activity.  She is able to do her work and drive without difficulty.  Patient also had intermittent headaches, with frontal, bandlike sensation, throbbing pain, phonophobia.  Headaches can last all day at a time.  No nausea, vision changes or dizziness.  In addition she has lower level daily nagging headaches.  She uses Tylenol 20 days a month.    Patient also has some intermittent numbness in her face.  Intermittent tremors.  Intermittent word find difficulty.  Having more stress.   REVIEW OF SYSTEMS: Full 14 system review of systems performed and negative with exception of: As per HPI.  ALLERGIES: Allergies  Allergen Reactions   Penicillins Dermatitis and Itching    RASH, amoxicillin  RASH  RASH, amoxicillin    RASH, amoxicillin  RASH    RASH  RASH, amoxicillin  RASH,  amoxicillin    RASH  RASH, amoxicillin    RASH, amoxicillin   Lipitor [Atorvastatin ] Other (See Comments)    Cognitive impact   Statins     Other Reaction(s): cognitive issues    HOME MEDICATIONS: Outpatient Medications Prior to Visit  Medication Sig Dispense Refill   acetaminophen (TYLENOL) 500 MG tablet Take 500 mg by mouth as needed.     Albuterol -Budesonide  (AIRSUPRA ) 90-80 MCG/ACT AERO Inhale 2 puffs into the lungs every 4 (four) hours as needed (wheezing, shortness of breath). 10.7 g 5   dupilumab  (DUPIXENT ) 300 MG/2ML prefilled syringe Inject 300 mg into the skin every 14 (fourteen) days. 4 mL 3   EPINEPHrine  0.3 mg/0.3 mL IJ SOAJ injection Inject 0.3 mg into the muscle as needed for anaphylaxis. 2 each 3   eszopiclone  (LUNESTA ) 1 MG TABS tablet Take 1 tablet (1 mg total) by mouth at bedtime as needed for sleep. Take immediately before bedtime 30 tablet 0   FLUoxetine  (PROZAC ) 20 MG capsule Take 1 capsule (20 mg total) by mouth daily. 90 capsule 1   fluticasone  (VERAMYST) 27.5 MCG/SPRAY nasal spray Place 2 sprays into the nose daily.     fluticasone -salmeterol (ADVAIR  HFA) 230-21 MCG/ACT inhaler Inhale 2 puffs into the lungs 2 (two) times daily. 12 g 12   hydrochlorothiazide  (MICROZIDE ) 12.5 MG capsule Take 1 capsule (12.5 mg total) by mouth daily. 90 capsule 2   LACTASE ENZYME PO Take 1 each by mouth as needed.     levothyroxine  (SYNTHROID )  25 MCG tablet Take 1 tablet daily 4 days a week, take 2 tablets daily 3 days a week.  Take 30 minutes prior to breakfast. 120 tablet 2   loratadine  (CLARITIN ) 10 MG tablet Take 1 tablet (10 mg total) by mouth 2 (two) times daily. 60 tablet 5   losartan  (COZAAR ) 25 MG tablet Take 1 tablet (25 mg total) by mouth daily. 90 tablet 2   mometasone  (ELOCON ) 0.1 % cream APPLY A THIN LAYER TO THE AFFECTED AREA OF THE SKIN DAILY 45 g 0   montelukast  (SINGULAIR ) 10 MG tablet Take 1 tablet (10 mg total) by mouth at bedtime. 30 tablet 11   omeprazole   (PRILOSEC) 40 MG capsule TAKE 1 CAPSULE BY MOUTH ONCE DAILY 90 capsule 0   pantoprazole  (PROTONIX ) 40 MG tablet Take 1 tablet (40 mg total) by mouth daily. 90 tablet 2   rosuvastatin  (CRESTOR ) 5 MG tablet Take 1 tablet (5 mg total) by mouth daily. 90 tablet 3   sucralfate  (CARAFATE ) 1 g tablet Take by mouth 4 (four) times daily as needed.     Tiotropium Bromide  (SPIRIVA  RESPIMAT) 1.25 MCG/ACT AERS Inhale 2 puffs into the lungs daily. 12 g 11   valACYclovir  (VALTREX ) 500 MG tablet Take 500 mg by mouth 2 (two) times daily. (Patient taking differently: Take 500 mg by mouth daily as needed.)     Vitamin D , Ergocalciferol , (DRISDOL ) 1.25 MG (50000 UNIT) CAPS capsule Take 1 capsule (50,000 Units total) by mouth every 7 (seven) days. 4 capsule 0   No facility-administered medications prior to visit.    PAST MEDICAL HISTORY: Past Medical History:  Diagnosis Date   Anemia    Angio-edema    Anxiety    Asthma due to environmental allergies    Asthma exacerbation, mild 02/12/2020   Back pain    Cervical stenosis of spine    Constipation    Dysphonia    Frequent headaches    GERD (gastroesophageal reflux disease)    Hashimoto's disease    Hyperlipidemia    Hypertension    IBS (irritable bowel syndrome)    Joint pain    Lactose intolerance    Metabolic syndrome    Multiple food allergies    Obesity    Osteoarthritis    Osteopenia    Other fatigue    Palpitations    Periodontal disease    Prediabetes    Reactive airway disease    post CAP   Shortness of breath    Shortness of breath on exertion    Swallowing difficulty     PAST SURGICAL HISTORY: Past Surgical History:  Procedure Laterality Date   CARDIAC CATHETERIZATION  1997   negative   COLONOSCOPY  2007   Dr Donnald   DILATION AND CURETTAGE OF UTERUS     UPPER GI ENDOSCOPY     X 2   WISDOM TOOTH EXTRACTION      FAMILY HISTORY: Family History  Adopted: Yes  Problem Relation Age of Onset   Cancer Mother    Allergic  rhinitis Mother    Anxiety disorder Mother    Asthma Neg Hx    Eczema Neg Hx    Urticaria Neg Hx    Seizures Neg Hx    Stroke Neg Hx    Migraines Neg Hx     SOCIAL HISTORY: Social History   Socioeconomic History   Marital status: Single    Spouse name: Not on file   Number of children: Not on file  Years of education: Not on file   Highest education level: Bachelor's degree (e.g., BA, AB, BS)  Occupational History   Occupation: Copywriter, Advertising: [employer]    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  Tobacco Use   Smoking status: Former    Current packs/day: 0.00    Average packs/day: 1.5 packs/day for 12.0 years (18.0 ttl pk-yrs)    Types: Cigarettes    Start date: 03/14/1973    Quit date: 03/14/1985    Years since quitting: 38.9   Smokeless tobacco: Never   Tobacco comments:    smoked 1975-1986, up to 1.5 ppd  Vaping Use   Vaping status: Never Used  Substance and Sexual Activity   Alcohol use: No    Comment: very rarely   Drug use: No   Sexual activity: Not Currently  Other Topics Concern   Not on file  Social History Narrative   Exercise: 2-4 times a week   2 cups of caffeine daily ( no more than 100mg  of caffeine daily)    Social Drivers of Health   Tobacco Use: Medium Risk (02/23/2024)   Patient History    Smoking Tobacco Use: Former    Smokeless Tobacco Use: Never    Passive Exposure: Not on Actuary Strain: Low Risk (01/05/2024)   Overall Financial Resource Strain (CARDIA)    Difficulty of Paying Living Expenses: Not very hard  Food Insecurity: Food Insecurity Present (01/05/2024)   Epic    Worried About Programme Researcher, Broadcasting/film/video in the Last Year: Sometimes true    Ran Out of Food in the Last Year: Never true  Transportation Needs: No Transportation Needs (01/05/2024)   Epic    Lack of Transportation (Medical): No    Lack of Transportation (Non-Medical): No  Physical Activity: Sufficiently Active (01/05/2024)   Exercise Vital Sign     Days of Exercise per Week: 6 days    Minutes of Exercise per Session: 40 min  Recent Concern: Physical Activity - Insufficiently Active (10/10/2023)   Exercise Vital Sign    Days of Exercise per Week: 4 days    Minutes of Exercise per Session: 30 min  Stress: No Stress Concern Present (01/05/2024)   [employer] of Occupational Health - Occupational Stress Questionnaire    Feeling of Stress: Not at all  Recent Concern: Stress - Stress Concern Present (10/10/2023)   [employer] of Occupational Health - Occupational Stress Questionnaire    Feeling of Stress: To some extent  Social Connections: Moderately Integrated (01/05/2024)   Social Connection and Isolation Panel    Frequency of Communication with Friends and Family: Three times a week    Frequency of Social Gatherings with Friends and Family: Once a week    Attends Religious Services: More than 4 times per year    Active Member of [employer] or Organizations: Yes    Attends Engineer, Structural: More than 4 times per year    Marital Status: Divorced  Recent Concern: Social Connections - Moderately Isolated (10/10/2023)   Social Connection and Isolation Panel    Frequency of Communication with Friends and Family: Twice a week    Frequency of Social Gatherings with Friends and Family: Twice a week    Attends Religious Services: More than 4 times per year    Active Member of [employer] or Organizations: No    Attends Engineer, Structural: Not on file    Marital Status: Divorced  Intimate Partner Violence: Not At  Risk (01/05/2024)   Epic    Fear of Current or Ex-Partner: No    Emotionally Abused: No    Physically Abused: No    Sexually Abused: No  Depression (PHQ2-9): Low Risk (01/05/2024)   Depression (PHQ2-9)    PHQ-2 Score: 0  Recent Concern: Depression (PHQ2-9) - Medium Risk (11/20/2023)   Depression (PHQ2-9)    PHQ-2 Score: 6  Alcohol Screen: Low Risk (01/05/2024)   Alcohol Screen    Last Alcohol Screening  Score (AUDIT): 1  Housing: Unknown (01/19/2024)   Received from [facility]   Epic    Unable to Pay for Housing in the Last Year: Not on file    Number of Times Moved in the Last Year: Not on file    At any time in the past 12 months, were you homeless or living in a shelter (including now)?: No  Utilities: Not At Risk (01/05/2024)   Epic    Threatened with loss of utilities: No  Health Literacy: Adequate Health Literacy (01/05/2024)   B1300 Health Literacy    Frequency of need for help with medical instructions: Never     PHYSICAL EXAM  GENERAL EXAM/CONSTITUTIONAL: Vitals:  Vitals:   02/23/24 1100  BP: 104/73  Pulse: 77  SpO2: 98%  Weight: 194 lb (88 kg)   Body mass index is 33.3 kg/m. Wt Readings from Last 3 Encounters:  02/23/24 194 lb (88 kg)  02/21/24 191 lb (86.6 kg)  02/19/24 192 lb (87.1 kg)   Patient is in no distress; well developed, nourished and groomed; neck is supple  CARDIOVASCULAR: Examination of carotid arteries is normal; no carotid bruits Regular rate and rhythm, no murmurs Examination of peripheral vascular system by observation and palpation is normal  EYES: Ophthalmoscopic exam of optic discs and posterior segments is normal; no papilledema or hemorrhages No results found.  MUSCULOSKELETAL: Gait, strength, tone, movements noted in Neurologic exam below  NEUROLOGIC: MENTAL STATUS:      No data to display         awake, alert, oriented to person, place and time recent and remote memory intact normal attention and concentration language fluent, comprehension intact, naming intact fund of knowledge appropriate  CRANIAL NERVE:  2nd - no papilledema on fundoscopic exam 2nd, 3rd, 4th, 6th - pupils equal and reactive to light, visual fields full to confrontation, extraocular muscles intact, no nystagmus 5th - facial sensation symmetric 7th - facial strength symmetric 8th - hearing intact 9th - palate elevates  symmetrically, uvula midline 11th - shoulder shrug symmetric 12th - tongue protrusion midline  MOTOR:  POSTURAL AND ACTION TREMOR IN BUE normal bulk and tone, full strength in the BUE, BLE  SENSORY:  normal and symmetric to light touch  COORDINATION:  finger-nose-finger, fine finger movements normal  REFLEXES:  deep tendon reflexes 1+ and symmetric  GAIT/STATION:  narrow based gait     DIAGNOSTIC DATA (LABS, IMAGING, TESTING) - I reviewed patient records, labs, notes, testing and imaging myself where available.  Lab Results  Component Value Date   WBC 6.9 11/20/2023   HGB 13.3 11/20/2023   HCT 43.1 11/20/2023   MCV 85 11/20/2023   PLT 297 11/20/2023      Component Value Date/Time   NA 141 11/20/2023 1057   K 4.3 11/20/2023 1057   CL 101 11/20/2023 1057   CO2 24 11/20/2023 1057   GLUCOSE 76 11/20/2023 1057   GLUCOSE 81 11/18/2020 1557   GLUCOSE 77 01/24/2006 1211  BUN 19 11/20/2023 1057   CREATININE 0.81 11/20/2023 1057   CALCIUM  10.1 11/20/2023 1057   PROT 7.0 11/20/2023 1057   ALBUMIN 4.5 11/20/2023 1057   AST 22 11/20/2023 1057   ALT 11 11/20/2023 1057   ALKPHOS 128 (H) 11/20/2023 1057   BILITOT 0.5 11/20/2023 1057   GFRNONAA >60 11/18/2020 1557   GFRAA >60 11/10/2019 0032   Lab Results  Component Value Date   CHOL 196 11/20/2023   HDL 61 11/20/2023   LDLCALC 101 (H) 11/20/2023   LDLDIRECT 150.0 09/06/2019   TRIG 198 (H) 11/20/2023   CHOLHDL 3.2 11/20/2023   Lab Results  Component Value Date   HGBA1C 5.8 (H) 11/20/2023   Lab Results  Component Value Date   VITAMINB12 1,292 (H) 11/20/2023   Lab Results  Component Value Date   TSH 1.67 01/26/2024    11/27/2019 MRI brain - Unremarkable MRI brain (with and without). No acute findings.   ASSESSMENT AND PLAN  68 y.o. year old female here with:  Meds tried: tylenol, losartan , prozac   Dx:  1. Chronic migraine without aura without status migrainosus, not intractable   2. Essential  tremor     PLAN:  MIGRAINE WITHOUT AURA  MIGRAINE TREATMENT PLAN:  MIGRAINE PREVENTION  -Eat and sleep on a regular schedule -Exercise several times per week - start topiramate  50mg  at bedtime; after 1-2 weeks increase to 50mg  twice a day; drink plenty of water  Consider 2nd line (if 1st line not working or tolerated) - rimegepant (Nurtec) 75mg  every other day - atogepant (Qulipta) 60mg  daily - erenumab (Aimovig) 70mg  monthly (may increase to 140mg  monthly) - fremanezumab (Ajovy) 225mg  monthly (or 675mg  every 3 months) - galazanezumab (Emgality) 240mg  loading dose; then 120mg  monthly   MIGRAINE RESCUE  - ibuprofen, tylenol as needed - rizatriptan  (Maxalt ) 10mg  as needed for breakthrough headache; may repeat x 1 after 2 hours; max 2 tabs per day or 8 per month  Consider CGRP antagonists (if triptans not working or contraindicated) - ubrogepant Deadra) 100mg  as needed for breakthrough headache; may repeat x 1 after 2 hours; max 2 tabs per day or 8 per month - rimegepant (Nurtec) 75mg  as needed for breakthrough headache; max 8 per month   ESSENTIAL TREMOR - consider changing BP meds to beta-blocker (which could also help with migraine prevention)  Meds ordered this encounter  Medications   topiramate  (TOPAMAX ) 50 MG tablet    Sig: Take 1 tablet (50 mg total) by mouth 2 (two) times daily.    Dispense:  60 tablet    Refill:  12   rizatriptan  (MAXALT -MLT) 10 MG disintegrating tablet    Sig: Take 1 tablet (10 mg total) by mouth as needed for migraine. May repeat in 2 hours if needed    Dispense:  9 tablet    Refill:  11   Return in about 4 months (around 06/23/2024) for MyChart visit (15 min).    EDUARD FABIENE HANLON, MD 02/23/2024, 11:57 AM Certified in Neurology, Neurophysiology and Neuroimaging  [facility] [address], [address] [place], [place] [address] [phone]

## 2024-02-23 NOTE — Patient Instructions (Addendum)
°  MIGRAINE PREVENTION  -Eat and sleep on a regular schedule -Exercise several times per week - start topiramate  50mg  at bedtime; after 1-2 weeks increase to 50mg  twice a day; drink plenty of water  MIGRAINE RESCUE  - ibuprofen, tylenol as needed - rizatriptan (Maxalt) 10mg  as needed for breakthrough headache; may repeat x 1 after 2 hours; max 2 tabs per day or 8 per month  ESSENTIAL TREMOR - consider changing BP meds to beta-blocker (which could also help with migraine prevention)

## 2024-02-23 NOTE — Progress Notes (Deleted)
 [facility]  PATIENT: Dana Mccoy DOB: 1955/08/21  REFERRING CLINICIAN: Geofm Glade PARAS, MD HISTORY FROM: *** REASON FOR VISIT: ***   HISTORICAL  CHIEF COMPLAINT:  Chief Complaint  Patient presents with   RM 7     Patient is here alone for headaches and tremors - was seen 11/20/2019 by Dr. SHAUNNA for fatigue, headaches and tremors     HISTORY OF PRESENT ILLNESS:   ***  REVIEW OF SYSTEMS: Full 14 system review of systems performed and negative with exception of: ***  ALLERGIES: Allergies[1]  HOME MEDICATIONS: Outpatient Medications Prior to Visit  Medication Sig Dispense Refill   acetaminophen (TYLENOL) 500 MG tablet Take 500 mg by mouth as needed.     Albuterol -Budesonide  (AIRSUPRA ) 90-80 MCG/ACT AERO Inhale 2 puffs into the lungs every 4 (four) hours as needed (wheezing, shortness of breath). 10.7 g 5   dupilumab  (DUPIXENT ) 300 MG/2ML prefilled syringe Inject 300 mg into the skin every 14 (fourteen) days. 4 mL 3   EPINEPHrine  0.3 mg/0.3 mL IJ SOAJ injection Inject 0.3 mg into the muscle as needed for anaphylaxis. 2 each 3   eszopiclone  (LUNESTA ) 1 MG TABS tablet Take 1 tablet (1 mg total) by mouth at bedtime as needed for sleep. Take immediately before bedtime 30 tablet 0   FLUoxetine  (PROZAC ) 20 MG capsule Take 1 capsule (20 mg total) by mouth daily. 90 capsule 1   fluticasone  (VERAMYST) 27.5 MCG/SPRAY nasal spray Place 2 sprays into the nose daily.     fluticasone -salmeterol (ADVAIR  HFA) 230-21 MCG/ACT inhaler Inhale 2 puffs into the lungs 2 (two) times daily. 12 g 12   hydrochlorothiazide  (MICROZIDE ) 12.5 MG capsule Take 1 capsule (12.5 mg total) by mouth daily. 90 capsule 2   LACTASE ENZYME PO Take 1 each by mouth as needed.     levothyroxine  (SYNTHROID ) 25 MCG tablet Take 1 tablet daily 4 days a week, take 2 tablets daily 3 days a week.  Take 30 minutes prior to breakfast. 120 tablet 2   loratadine  (CLARITIN ) 10 MG tablet Take 1 tablet (10 mg total)  by mouth 2 (two) times daily. 60 tablet 5   losartan  (COZAAR ) 25 MG tablet Take 1 tablet (25 mg total) by mouth daily. 90 tablet 2   mometasone  (ELOCON ) 0.1 % cream APPLY A THIN LAYER TO THE AFFECTED AREA OF THE SKIN DAILY 45 g 0   montelukast  (SINGULAIR ) 10 MG tablet Take 1 tablet (10 mg total) by mouth at bedtime. 30 tablet 11   omeprazole  (PRILOSEC) 40 MG capsule TAKE 1 CAPSULE BY MOUTH ONCE DAILY 90 capsule 0   pantoprazole  (PROTONIX ) 40 MG tablet Take 1 tablet (40 mg total) by mouth daily. 90 tablet 2   rosuvastatin  (CRESTOR ) 5 MG tablet Take 1 tablet (5 mg total) by mouth daily. 90 tablet 3   sucralfate  (CARAFATE ) 1 g tablet Take by mouth 4 (four) times daily as needed.     Tiotropium Bromide  (SPIRIVA  RESPIMAT) 1.25 MCG/ACT AERS Inhale 2 puffs into the lungs daily. 12 g 11   valACYclovir  (VALTREX ) 500 MG tablet Take 500 mg by mouth 2 (two) times daily. (Patient taking differently: Take 500 mg by mouth daily as needed.)     Vitamin D , Ergocalciferol , (DRISDOL ) 1.25 MG (50000 UNIT) CAPS capsule Take 1 capsule (50,000 Units total) by mouth every 7 (seven) days. 4 capsule 0   No facility-administered medications prior to visit.    PAST MEDICAL HISTORY: Past Medical History:  Diagnosis Date  Anemia    Angio-edema    Anxiety    Asthma due to environmental allergies    Asthma exacerbation, mild 02/12/2020   Back pain    Cervical stenosis of spine    Constipation    Dysphonia    Frequent headaches    GERD (gastroesophageal reflux disease)    Hashimoto's disease    Hyperlipidemia    Hypertension    IBS (irritable bowel syndrome)    Joint pain    Lactose intolerance    Metabolic syndrome    Multiple food allergies    Obesity    Osteoarthritis    Osteopenia    Other fatigue    Palpitations    Periodontal disease    Prediabetes    Reactive airway disease    post CAP   Shortness of breath    Shortness of breath on exertion    Swallowing difficulty     PAST SURGICAL  HISTORY: Past Surgical History:  Procedure Laterality Date   CARDIAC CATHETERIZATION  1997   negative   COLONOSCOPY  2007   Dr Donnald   DILATION AND CURETTAGE OF UTERUS     UPPER GI ENDOSCOPY     X 2   WISDOM TOOTH EXTRACTION      FAMILY HISTORY: Family History  Adopted: Yes  Problem Relation Age of Onset   Cancer Mother    Allergic rhinitis Mother    Anxiety disorder Mother    Asthma Neg Hx    Eczema Neg Hx    Urticaria Neg Hx    Seizures Neg Hx    Stroke Neg Hx    Migraines Neg Hx     SOCIAL HISTORY: Social History   Socioeconomic History   Marital status: Single    Spouse name: Not on file   Number of children: Not on file   Years of education: Not on file   Highest education level: Bachelor's degree (e.g., BA, AB, BS)  Occupational History   Occupation: Copywriter, Advertising: [employer]    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  Tobacco Use   Smoking status: Former    Current packs/day: 0.00    Average packs/day: 1.5 packs/day for 12.0 years (18.0 ttl pk-yrs)    Types: Cigarettes    Start date: 03/14/1973    Quit date: 03/14/1985    Years since quitting: 38.9   Smokeless tobacco: Never   Tobacco comments:    smoked 1975-1986, up to 1.5 ppd  Vaping Use   Vaping status: Never Used  Substance and Sexual Activity   Alcohol use: No    Comment: very rarely   Drug use: No   Sexual activity: Not Currently  Other Topics Concern   Not on file  Social History Narrative   Exercise: 2-4 times a week   2 cups of caffeine daily ( no more than 100mg  of caffeine daily)    Social Drivers of Health   Tobacco Use: Medium Risk (02/23/2024)   Patient History    Smoking Tobacco Use: Former    Smokeless Tobacco Use: Never    Passive Exposure: Not on Actuary Strain: Low Risk (01/05/2024)   Overall Financial Resource Strain (CARDIA)    Difficulty of Paying Living Expenses: Not very hard  Food Insecurity: Food Insecurity Present (01/05/2024)   Epic     Worried About Programme Researcher, Broadcasting/film/video in the Last Year: Sometimes true    Ran Out of Food in the Last Year: Never true  Transportation Needs: No Transportation Needs (01/05/2024)   Epic    Lack of Transportation (Medical): No    Lack of Transportation (Non-Medical): No  Physical Activity: Sufficiently Active (01/05/2024)   Exercise Vital Sign    Days of Exercise per Week: 6 days    Minutes of Exercise per Session: 40 min  Recent Concern: Physical Activity - Insufficiently Active (10/10/2023)   Exercise Vital Sign    Days of Exercise per Week: 4 days    Minutes of Exercise per Session: 30 min  Stress: No Stress Concern Present (01/05/2024)   [employer] of Occupational Health - Occupational Stress Questionnaire    Feeling of Stress: Not at all  Recent Concern: Stress - Stress Concern Present (10/10/2023)   [employer] of Occupational Health - Occupational Stress Questionnaire    Feeling of Stress: To some extent  Social Connections: Moderately Integrated (01/05/2024)   Social Connection and Isolation Panel    Frequency of Communication with Friends and Family: Three times a week    Frequency of Social Gatherings with Friends and Family: Once a week    Attends Religious Services: More than 4 times per year    Active Member of [employer] or Organizations: Yes    Attends Engineer, Structural: More than 4 times per year    Marital Status: Divorced  Recent Concern: Social Connections - Moderately Isolated (10/10/2023)   Social Connection and Isolation Panel    Frequency of Communication with Friends and Family: Twice a week    Frequency of Social Gatherings with Friends and Family: Twice a week    Attends Religious Services: More than 4 times per year    Active Member of [employer] or Organizations: No    Attends Engineer, Structural: Not on file    Marital Status: Divorced  Intimate Partner Violence: Not At Risk (01/05/2024)   Epic    Fear of Current or Ex-Partner: No     Emotionally Abused: No    Physically Abused: No    Sexually Abused: No  Depression (PHQ2-9): Low Risk (01/05/2024)   Depression (PHQ2-9)    PHQ-2 Score: 0  Recent Concern: Depression (PHQ2-9) - Medium Risk (11/20/2023)   Depression (PHQ2-9)    PHQ-2 Score: 6  Alcohol Screen: Low Risk (01/05/2024)   Alcohol Screen    Last Alcohol Screening Score (AUDIT): 1  Housing: Unknown (01/19/2024)   Received from [facility]   Epic    Unable to Pay for Housing in the Last Year: Not on file    Number of Times Moved in the Last Year: Not on file    At any time in the past 12 months, were you homeless or living in a shelter (including now)?: No  Utilities: Not At Risk (01/05/2024)   Epic    Threatened with loss of utilities: No  Health Literacy: Adequate Health Literacy (01/05/2024)   B1300 Health Literacy    Frequency of need for help with medical instructions: Never     PHYSICAL EXAM  ***  GENERAL EXAM/CONSTITUTIONAL: Vitals:  Vitals:   02/23/24 1100  BP: 104/73  Pulse: 77  SpO2: 98%  Weight: 194 lb (88 kg)   Body mass index is 33.3 kg/m. Wt Readings from Last 3 Encounters:  02/23/24 194 lb (88 kg)  02/21/24 191 lb (86.6 kg)  02/19/24 192 lb (87.1 kg)   Patient is in no distress; well developed, nourished and groomed; neck is supple  CARDIOVASCULAR: Examination of carotid arteries is normal;  no carotid bruits Regular rate and rhythm, no murmurs Examination of peripheral vascular system by observation and palpation is normal  EYES: Ophthalmoscopic exam of optic discs and posterior segments is normal; no papilledema or hemorrhages No results found.  MUSCULOSKELETAL: Gait, strength, tone, movements noted in Neurologic exam below  NEUROLOGIC: MENTAL STATUS:      No data to display         awake, alert, oriented to person, place and time recent and remote memory intact normal attention and concentration language fluent, comprehension intact,  naming intact fund of knowledge appropriate  CRANIAL NERVE:  2nd - no papilledema on fundoscopic exam 2nd, 3rd, 4th, 6th - pupils equal and reactive to light, visual fields full to confrontation, extraocular muscles intact, no nystagmus 5th - facial sensation symmetric 7th - facial strength symmetric 8th - hearing intact 9th - palate elevates symmetrically, uvula midline 11th - shoulder shrug symmetric 12th - tongue protrusion midline  MOTOR:  normal bulk and tone, full strength in the BUE, BLE  SENSORY:  normal and symmetric to light touch, pinprick, temperature, vibration  COORDINATION:  finger-nose-finger, fine finger movements normal  REFLEXES:  deep tendon reflexes present and symmetric  GAIT/STATION:  narrow based gait; able to walk on toes, heels and tandem; romberg is negative     DIAGNOSTIC DATA (LABS, IMAGING, TESTING) - I reviewed patient records, labs, notes, testing and imaging myself where available.  Lab Results  Component Value Date   WBC 6.9 11/20/2023   HGB 13.3 11/20/2023   HCT 43.1 11/20/2023   MCV 85 11/20/2023   PLT 297 11/20/2023      Component Value Date/Time   NA 141 11/20/2023 1057   K 4.3 11/20/2023 1057   CL 101 11/20/2023 1057   CO2 24 11/20/2023 1057   GLUCOSE 76 11/20/2023 1057   GLUCOSE 81 11/18/2020 1557   GLUCOSE 77 01/24/2006 1211   BUN 19 11/20/2023 1057   CREATININE 0.81 11/20/2023 1057   CALCIUM  10.1 11/20/2023 1057   PROT 7.0 11/20/2023 1057   ALBUMIN 4.5 11/20/2023 1057   AST 22 11/20/2023 1057   ALT 11 11/20/2023 1057   ALKPHOS 128 (H) 11/20/2023 1057   BILITOT 0.5 11/20/2023 1057   GFRNONAA >60 11/18/2020 1557   GFRAA >60 11/10/2019 0032   Lab Results  Component Value Date   CHOL 196 11/20/2023   HDL 61 11/20/2023   LDLCALC 101 (H) 11/20/2023   LDLDIRECT 150.0 09/06/2019   TRIG 198 (H) 11/20/2023   CHOLHDL 3.2 11/20/2023   Lab Results  Component Value Date   HGBA1C 5.8 (H) 11/20/2023   Lab Results   Component Value Date   VITAMINB12 1,292 (H) 11/20/2023   Lab Results  Component Value Date   TSH 1.67 01/26/2024    ***    ASSESSMENT AND PLAN  68 y.o. year old female here with ***   Dx:  No diagnosis found.    PLAN:  ***  No orders of the defined types were placed in this encounter.   No orders of the defined types were placed in this encounter.   No follow-ups on file.    EDUARD FABIENE HANLON, MD 02/23/2024, 11:25 AM Certified in Neurology, Neurophysiology and Neuroimaging  [facility] [address], [address] [place], [place] [address] [phone]     [1]  Allergies Allergen Reactions   Penicillins Dermatitis and Itching    RASH, amoxicillin  RASH  RASH, amoxicillin    RASH, amoxicillin  RASH  RASH  RASH, amoxicillin  RASH, amoxicillin    RASH  RASH, amoxicillin    RASH, amoxicillin   Lipitor [Atorvastatin ] Other (See Comments)    Cognitive impact   Statins     Other Reaction(s): cognitive issues

## 2024-02-26 ENCOUNTER — Other Ambulatory Visit (HOSPITAL_COMMUNITY): Payer: Self-pay

## 2024-02-26 ENCOUNTER — Ambulatory Visit

## 2024-02-29 ENCOUNTER — Other Ambulatory Visit: Payer: Self-pay

## 2024-02-29 NOTE — Progress Notes (Signed)
 Specialty Pharmacy Refill Coordination Note  Dana Mccoy is a 68 y.o. female contacted today regarding refills of specialty medication(s) Dupilumab  (Dupixent )   Patient requested [name] at [facility] [place] date: 03/11/24   Medication will be filled on: 03/08/24

## 2024-03-08 ENCOUNTER — Other Ambulatory Visit: Payer: Self-pay

## 2024-03-09 ENCOUNTER — Other Ambulatory Visit (HOSPITAL_BASED_OUTPATIENT_CLINIC_OR_DEPARTMENT_OTHER): Payer: Self-pay

## 2024-03-12 ENCOUNTER — Ambulatory Visit

## 2024-03-12 DIAGNOSIS — R498 Other voice and resonance disorders: Secondary | ICD-10-CM

## 2024-03-12 NOTE — Patient Instructions (Addendum)
" ° ° ° °  PHoRTE VOICE EXERCISES - DO TWICE EACH DAY  1) Hold AHHHHHHHHHHHHH 10 times   In a strong voice as long as you can - push from your abdominal muscles!   [email or web address]  2) Count 1-10 (skip 7)   Start at as low pitch as you can, glide to as high pitch as you can, then back down as low as you can   [email or web address]  3) Say 10 sentences -two different ways   (a) like you are calling over the fence to your neighbor in a higher-pitch voice  (b) like you are scolding your dog in a LOW authoritative voice   [email or web address]  "

## 2024-03-12 NOTE — Therapy (Addendum)
 " OUTPATIENT SPEECH LANGUAGE PATHOLOGY VOICE TREATMENT   Patient Name: Dana Mccoy MRN: [id number] DOB:1955-07-25, 68 y.o., female Today's Date: 03/12/2024  PCP: Geofm Hastings, MD REFERRING PROVIDER: Malachy Crank, MD  END OF SESSION:  End of Session - 03/12/24 1453     Visit Number 6    Number of Visits 13    Date for Recertification  03/22/24    SLP Start Time 1450    SLP Stop Time  1530    SLP Time Calculation (min) 40 min    Activity Tolerance Patient tolerated treatment well            Past Medical History:  Diagnosis Date   Anemia    Angio-edema    Anxiety    Asthma due to environmental allergies    Asthma exacerbation, mild 02/12/2020   Back pain    Cervical stenosis of spine    Constipation    Dysphonia    Frequent headaches    GERD (gastroesophageal reflux disease)    Hashimoto's disease    Hyperlipidemia    Hypertension    IBS (irritable bowel syndrome)    Joint pain    Lactose intolerance    Metabolic syndrome    Multiple food allergies    Obesity    Osteoarthritis    Osteopenia    Other fatigue    Palpitations    Periodontal disease    Prediabetes    Reactive airway disease    post CAP   Shortness of breath    Shortness of breath on exertion    Swallowing difficulty    Past Surgical History:  Procedure Laterality Date   CARDIAC CATHETERIZATION  1997   negative   COLONOSCOPY  2007   Dr Donnald   DILATION AND CURETTAGE OF UTERUS     UPPER GI ENDOSCOPY     X 2   WISDOM TOOTH EXTRACTION     Patient Active Problem List   Diagnosis Date Noted   Mild obstructive sleep apnea in adult 12/28/2023   Vitamin D  deficiency 12/28/2023   Soft tissue swelling of chest wall 12/20/2023   Orthostatic dizziness 11/07/2023   Hypothyroidism due to Hashimoto thyroiditis 10/11/2023   Thyroid  nodule 10/11/2023   Osteopenia 10/11/2023   SOB (shortness of breath) 10/11/2023   Vocal cord dysfunction 10/11/2023   Soreness of tongue 11/10/2021    Upper respiratory tract infection 05/26/2021   Prediabetes 04/20/2021   Hypertension 04/20/2021   Other irritable bowel syndrome 04/20/2021   Severe asthma without complication 02/12/2020   Adverse food reaction 01/30/2020   Angio-edema 01/17/2020   Frequent headaches 01/17/2020   DDD (degenerative disc disease), cervical 09/24/2019   Numbness and tingling 09/06/2019   Tremor 09/06/2019   Restless leg syndrome 07/27/2018   Excessive daytime sleepiness 08/21/2017   Bradycardia 08/21/2017   Obesity (BMI 30-39.9) 08/02/2013   Other allergic rhinitis 08/02/2013   Cervical stenosis of spine 04/26/2011   Hyperlipidemia 06/05/2007   Reactive airway disease 06/05/2007   Gastroesophageal reflux disease 06/05/2007    Onset date: >12 months ago; script 11/07/23  REFERRING DIAG: J38.3 (ICD-10-CM) - Vocal cord dysfunction R13.10 (ICD-10-CM) - Dysphagia, unspecified type  THERAPY DIAG:  Other voice and resonance disorders  Rationale for Evaluation and Treatment: Rehabilitation  SUBJECTIVE:   SUBJECTIVE STATEMENT: I had kids around all the time. (Reason for not completing AB at recommended frequency)  Pt accompanied by: self  PERTINENT HISTORY:  DR. MALACHY - 11/07/23 Discussed the use of AI scribe software  for clinical note transcription with the patient, who gave verbal consent to proceed.   History of Present Illness Dana Mccoy is a 68 year old female who presents with excessive daytime fatigue. She was referred by her primary doctor for a sleep study due to excessive daytime fatigue.   She experiences excessive daytime fatigue despite being on levothyroxine  for about a year, which has made some difference but not resolved the issue. Her TSH levels have normalized. A sleep study five years ago was negative for sleep apnea. There have been no major changes in her weight since. No drowsy driving, but she avoids long trips alone. She occasionally wakes up gasping and has been told  she snores in the past. No sleepwalking or sleep paralysis, but sometimes wakes up feeling disoriented. She wakes up multiple times a night, varying from three to five times, and sometimes has trouble falling asleep. She takes 5 mg of melatonin, which she feels helps. She experiences restless legs occasionally but has never taken medication for it. Doesn't feel like it causes significant problems. Occasional morning headaches.    She has a history of asthma and VCD. She sees Dr. Meade for follow up next month. She has noticed more frequent and severe asthma problems in the last couple of years. She's been stable since her last visit. She also reports having a hoarse voice chronically, which led to an ENT consultation where vocal cord dysfunction was noted. She briefly saw speech therapy when she lived in [place]. She occasionally has issues with swallowing and feels like it goes down the wrong way. She has not had any recent pneumonias or recurrent infections. She had an upper endoscopy last year which showed gastric polyps but no other significant findings. No changes in bowel habits, weight loss, bloody stools, abdominal pain. No fevers, hemoptysis.    She has a history of a low resting heart rate in the low fifties, which she considers problematic. She sometimes experiences dizziness or lightheadedness with changes in position. She's seeing a cardiologist regarding this. No chest pains, syncope.   PAIN:  Are you having pain? No  FALLS: Has patient fallen in last 6 months? No   PATIENT GOALS: Decrease frequency of cough  OBJECTIVE:  Note: Objective measures were completed at Evaluation unless otherwise noted.   PATIENT REPORTED OUTCOME MEASURES (PROM): [place] Cough Questionnaire (LCQ): pt returned LCQ and scored 85 with A good bit of the time (3) was indicated for bothered by phlegm production during cough, tired because of your cough, embarrassed by your cough, how frequently have you had  coughing bouts, concerned about other people think something is wrong with you, cough has interrupted conversations or phone calls, and cough has annoyed my family or friends. She answered most of the time (2) for my cough has made me feel frustrated, and my cough has made me feel fed up.  TREATMENT DATE:  Abdominal breathing=AB. Phonatory Resistance Treatment Exercises=PhoRTE  03/12/24: One throat clear today in session. SLP used PhoRTE today to strengthen vocal folds and add bulk to pt's vocal folds demonstrating reduced mass/atrophy due to age-related changes. /a/ average 83dB, Pitch glides averaged 84dB, high pitched sentences average 83dB, and low-pitched 81dB with more rough sounding voice.Today SLP worked with pt on AB at rest and she req'd usual min-mod A faded to occasional min A at rest and with vowel vocalization. SLP told pt to cont to practice AB 15 minutes BID, and complete PhoRTE.   03/02/24:  SLP emailed pt links and instructions for PhoRTE. Today SLP educated about rationale for PhoRTE and then demo'd each exercise for pt and ensured she performed correctly prior to moving to next exercise. Pt req'd consistent model for all exercises, and additional usual mod A for numbers (immediate drop off from high to low pitch instead of gradual drop off). SLP stressed pt must complete BID for 6-8 weeks for best chance to reduce glottal gap due to vocal fold atrophy. Pt was encouraged to cont with AB practice, and use of strategies for minimizing effect of cough/throat clear. Pt cleared throat x2 today.   02/12/24: Consider PhoRTE next session. One throat clear today. Pt with inconsistent practice with AB due to family and holiday schedule in the last two weeks. Crystol feels like throat clear/cough has increased in frequency since last session. SLP educated pt (re-educated pt) on  triggers and heightened emotion can be one trigger.  SLP encouraged pt to put some time into practicing AB and historically this has helped patients greatly. SLP reviewed visuatlization strategies and laryngeal control strategies with pt. SLP reiterated the use of written cues for each on a 4x6 card for practice and for cues when pt is in a spell. Pt is confident she can find time to practice AB between now and her next appointment. She thought she could do this from now until December 11th (her appointment next week). SLP agreed.  02/01/24: SLP worked with pt on AB and extending the breath to 4 seconds. Pt with initial cues necessary but independent with this after initial cues. SLP told pt to practice AB with 4 second inhale and 4 second exhale 12-15 minutes and then periodically throughout the day. Reviewed cough suppression strategies with pt and pt practiced with occasional min A necessary from SLP for procedure. SLP also reviewed relaxation/visualization strategies and reiterated these will assist with cough or throat clear due to VCD. 6 throat clears today, but only 2 the last 34 minutes after SLP reviewed throat clear alternatives with pt 7 minutes into session.  01/11/24: SLP discussion with pt about caffeine intake - pt is having approx 100 mg/daily using a drink mix. SLP suggested pt decr this due to drying effect on vocal folds. Pt already taking Singulair  and Claritin  daily. SLP told pt goal is 50mg  daily. Three throat clears today.  SLP then worked with AB with pt today at rest. Pt with 95% success with demo by SLP initially. SLP told pt to practice this 10-25 minutes BID. SLP introduced pt to throat clear alternatives and provided handout (see pt instructions). SLP used demonstration and pt return demonstrated.   01/08/24: AB and throat clear alternatives next session. Today SLP educated pt about cough suppression strategies, provided handout. Pt to practice approx 5-10 minutes once/twice a  day. SLP shared with pt about other visualizations - melting/evaporating, and larynx expanding like a grapefruit. Handout provided.  SLP guided pt through brief practice with AB - will continue this next session.   01/03/24: Needs cough suppression strategies, visualization, and AB next session. SLP educated pt about VCD as well as glottic insufficiency. Explained PhoRTE eventually, and also worked with pt re: generating a visual image of relaxation for pt to use when she feels sx VCD begin. Lastly SLP taught pt 'sniff sniff - easy exhale with pursed lips' strategy for cough suppression.   PATIENT EDUCATION: Education details: see Treatment date Person educated: Patient Education method: Explanation, Demonstration, Verbal cues, and Handouts Education comprehension: verbalized understanding, returned demonstration, verbal cues required, and needs further education  HOME EXERCISE PROGRAM: Eventually PhoRTE and AB, and visualization, and cough suppression   GOALS: Goals reviewed with patient? Yes  SHORT TERM GOALS: Target date: 02/09/24 (due to pt scheduling needs)  Pt will tell SLP of two breathing strategies, and two relaxation strategies to use for VCD in two sessions Baseline:02/12/24 Goal status: partially met  2.  Pt will decr caffeine intake to 50mg  to improve vocal hygiene Baseline:  Goal status: not met  3.  Pt will decr throat clears to < 3/ session x 2 sessions Baseline: 02/12/24 Goal status: partially met  4.  Pt will tell SLP 4 throat clear alternatives in 2 sessions Baseline:  Goal status: not met  5.  Pt will perform PhoRTE with rare min A in two sessions Baseline:  Goal status: deferred - not done yet   LONG TERM GOALS: Target date: 03/22/24  Pt will improve PROMs Baseline:  Goal status: INITIAL  2.  Pt will decr throat clears to <2 in two sessions Baseline:  Goal status: INITIAL  3.  Pt will decr caffeine intake to 30mg  to improve vocal  hygiene Baseline:  Goal status: INITIAL  4.  Pt will perform PhoRTE with modified independence in two sessions Baseline:  Goal status: INITIAL  5.  Pt will report improvement as less frequent s/sx VCD than prior to ST Baseline:  Goal status: INITIAL  6.  Pt will use AB 80% in 10 minutes conversation, in two sessions Baseline:  Goal status: INITIAL  ASSESSMENT:  CLINICAL IMPRESSION: STGs checked today. Patient is a 68 y.o. F who was seen today for treatment of voice in light of both glottic insufficiency and VCD. See treatment date above for today's date for further details on today's session. During eval, pt cleared throat 10 times in 38-minutes. She will benefit further from ST by working to meet goals above for VCD and for glottal insufficiency.  OBJECTIVE IMPAIRMENTS: include voice disorder. These impairments are limiting patient from household responsibilities, ADLs/IADLs, and effectively communicating at home and in community. Factors affecting potential to achieve goals and functional outcome are time post onset. Patient will benefit from skilled SLP services to address above impairments and improve overall function.  REHAB POTENTIAL: Good  PLAN:  SLP FREQUENCY: 1-2x/week  SLP DURATION: 8 weeks  PLANNED INTERVENTIONS: Environmental controls, Internal/external aids, Oral motor exercises, Functional tasks, SLP instruction and feedback, Compensatory strategies, Patient/family education, [phone] Treatment of speech (30 or 45 min) , and voice exercises    Tannah Dreyfuss, CCC-SLP 03/12/2024, 2:53 PM      "

## 2024-03-20 ENCOUNTER — Ambulatory Visit: Payer: Self-pay

## 2024-03-21 ENCOUNTER — Other Ambulatory Visit: Payer: Self-pay

## 2024-03-23 ENCOUNTER — Other Ambulatory Visit (HOSPITAL_BASED_OUTPATIENT_CLINIC_OR_DEPARTMENT_OTHER): Payer: Self-pay

## 2024-03-27 ENCOUNTER — Ambulatory Visit: Attending: Nurse Practitioner

## 2024-03-27 DIAGNOSIS — R498 Other voice and resonance disorders: Secondary | ICD-10-CM | POA: Insufficient documentation

## 2024-03-27 NOTE — Therapy (Signed)
 " OUTPATIENT SPEECH LANGUAGE PATHOLOGY VOICE TREATMENT   Patient Name: Dana Mccoy MRN: [id number] DOB:[birth year], 69 y.o., female Today's Date: 03/27/2024  PCP: Geofm Hastings, MD REFERRING PROVIDER: Malachy Crank, MD  END OF SESSION:  End of Session - 03/27/24 2344     Visit Number 7    Number of Visits 19    Date for Recertification  05/17/24    SLP Start Time 1534    SLP Stop Time  1615    SLP Time Calculation (min) 41 min    Activity Tolerance Patient tolerated treatment well             Past Medical History:  Diagnosis Date   Anemia    Angio-edema    Anxiety    Asthma due to environmental allergies    Asthma exacerbation, mild 02/12/2020   Back pain    Cervical stenosis of spine    Constipation    Dysphonia    Frequent headaches    GERD (gastroesophageal reflux disease)    Hashimoto's disease    Hyperlipidemia    Hypertension    IBS (irritable bowel syndrome)    Joint pain    Lactose intolerance    Metabolic syndrome    Multiple food allergies    Obesity    Osteoarthritis    Osteopenia    Other fatigue    Palpitations    Periodontal disease    Prediabetes    Reactive airway disease    post CAP   Shortness of breath    Shortness of breath on exertion    Swallowing difficulty    Past Surgical History:  Procedure Laterality Date   CARDIAC CATHETERIZATION  1997   negative   COLONOSCOPY  2007   Dr Donnald   DILATION AND CURETTAGE OF UTERUS     UPPER GI ENDOSCOPY     X 2   WISDOM TOOTH EXTRACTION     Patient Active Problem List   Diagnosis Date Noted   Mild obstructive sleep apnea in adult 12/28/2023   Vitamin D  deficiency 12/28/2023   Soft tissue swelling of chest wall 12/20/2023   Orthostatic dizziness 11/07/2023   Hypothyroidism due to Hashimoto thyroiditis 10/11/2023   Thyroid  nodule 10/11/2023   Osteopenia 10/11/2023   SOB (shortness of breath) 10/11/2023   Vocal cord dysfunction 10/11/2023   Soreness of tongue 11/10/2021    Upper respiratory tract infection 05/26/2021   Prediabetes 04/20/2021   Hypertension 04/20/2021   Other irritable bowel syndrome 04/20/2021   Severe asthma without complication 02/12/2020   Adverse food reaction 01/30/2020   Angio-edema 01/17/2020   Frequent headaches 01/17/2020   DDD (degenerative disc disease), cervical 09/24/2019   Numbness and tingling 09/06/2019   Tremor 09/06/2019   Restless leg syndrome 07/27/2018   Excessive daytime sleepiness 08/21/2017   Bradycardia 08/21/2017   Obesity (BMI 30-39.9) 08/02/2013   Other allergic rhinitis 08/02/2013   Cervical stenosis of spine 04/26/2011   Hyperlipidemia 06/05/2007   Reactive airway disease 06/05/2007   Gastroesophageal reflux disease 06/05/2007   Speech Therapy Progress Note  Dates of Reporting Period: 01/03/24 to present  Subjective Statement: Pt has been seen for 7 voice therapy sessions focusing on VCD sx, and glottal insufficiency.  Objective: See below.  Goal Update: See below  Plan: See pt for approx 12 more sessions. Due to illness and holidays she has not been seen consistently.   Reason Skilled Services are Required: Rehab potential not reached.    Onset date: >12 months ago;  script 11/07/23  REFERRING DIAG: J38.3 (ICD-10-CM) - Vocal cord dysfunction R13.10 (ICD-10-CM) - Dysphagia, unspecified type  THERAPY DIAG:  Other voice and resonance disorders  Rationale for Evaluation and Treatment: Rehabilitation  SUBJECTIVE:   SUBJECTIVE STATEMENT: I just got back and was in NICU so I didn't think about the (PhoRTE) sheet for about a week.  Pt accompanied by: self  PERTINENT HISTORY:  DR. MALACHY - 11/07/23 Discussed the use of AI scribe software for clinical note transcription with the patient, who gave verbal consent to proceed.   History of Present Illness Dana Mccoy is a 69 year old female who presents with excessive daytime fatigue. She was referred by her primary doctor for a sleep study  due to excessive daytime fatigue.   She experiences excessive daytime fatigue despite being on levothyroxine  for about a year, which has made some difference but not resolved the issue. Her TSH levels have normalized. A sleep study five years ago was negative for sleep apnea. There have been no major changes in her weight since. No drowsy driving, but she avoids long trips alone. She occasionally wakes up gasping and has been told she snores in the past. No sleepwalking or sleep paralysis, but sometimes wakes up feeling disoriented. She wakes up multiple times a night, varying from three to five times, and sometimes has trouble falling asleep. She takes 5 mg of melatonin, which she feels helps. She experiences restless legs occasionally but has never taken medication for it. Doesn't feel like it causes significant problems. Occasional morning headaches.    She has a history of asthma and VCD. She sees Dr. Meade for follow up next month. She has noticed more frequent and severe asthma problems in the last couple of years. She's been stable since her last visit. She also reports having a hoarse voice chronically, which led to an ENT consultation where vocal cord dysfunction was noted. She briefly saw speech therapy when she lived in [place]. She occasionally has issues with swallowing and feels like it goes down the wrong way. She has not had any recent pneumonias or recurrent infections. She had an upper endoscopy last year which showed gastric polyps but no other significant findings. No changes in bowel habits, weight loss, bloody stools, abdominal pain. No fevers, hemoptysis.    She has a history of a low resting heart rate in the low fifties, which she considers problematic. She sometimes experiences dizziness or lightheadedness with changes in position. She's seeing a cardiologist regarding this. No chest pains, syncope.   PAIN:  Are you having pain? No  FALLS: Has patient fallen in last 6 months?  No   PATIENT GOALS: Decrease frequency of cough  OBJECTIVE:  Note: Objective measures were completed at Evaluation unless otherwise noted.   PATIENT REPORTED OUTCOME MEASURES (PROM): [place] Cough Questionnaire (LCQ): pt returned LCQ and scored 85 with A good bit of the time (3) was indicated for bothered by phlegm production during cough, tired because of your cough, embarrassed by your cough, how frequently have you had coughing bouts, concerned about other people think something is wrong with you, cough has interrupted conversations or phone calls, and cough has annoyed my family or friends. She answered most of the time (2) for my cough has made me feel frustrated, and my cough has made me feel fed up.  TREATMENT DATE:  Abdominal breathing=AB. Phonatory Resistance Treatment Exercises=PhoRTE  03/27/24: One throat clear. SLP used PhoRTE today to strengthen vocal folds and add bulk to pt's vocal folds demonstrating reduced mass/atrophy due to age-related changes. /a/ average 81dB with initial cue to add volume. Pitch glides averaged 82dB, high pitched sentences average 82dB, and low-pitched 80dB with more rough sounding voice in lower pitches.Today SLP worked with pt on AB and she req'd mod-max A with question and answer tasks using AB - success was 30%. SLP told pt to cont to practice AB 15 minutes every day until next session.   03/12/24: One throat clear today in session. SLP used PhoRTE today to strengthen vocal folds and add bulk to pt's vocal folds demonstrating reduced mass/atrophy due to age-related changes. /a/ average 83dB, Pitch glides averaged 84dB, high pitched sentences average 83dB, and low-pitched 81dB with more rough sounding voice.Today SLP worked with pt on AB at rest and she req'd usual min-mod A faded to occasional min A at rest and with vowel  vocalization. SLP told pt to cont to practice AB 15 minutes BID, and complete PhoRTE.   03/02/24:  SLP emailed pt links and instructions for PhoRTE. Today SLP educated about rationale for PhoRTE and then demo'd each exercise for pt and ensured she performed correctly prior to moving to next exercise. Pt req'd consistent model for all exercises, and additional usual mod A for numbers (immediate drop off from high to low pitch instead of gradual drop off). SLP stressed pt must complete BID for 6-8 weeks for best chance to reduce glottal gap due to vocal fold atrophy. Pt was encouraged to cont with AB practice, and use of strategies for minimizing effect of cough/throat clear. Pt cleared throat x2 today.   02/12/24: Consider PhoRTE next session. One throat clear today. Pt with inconsistent practice with AB due to family and holiday schedule in the last two weeks. Pandora feels like throat clear/cough has increased in frequency since last session. SLP educated pt (re-educated pt) on triggers and heightened emotion can be one trigger.  SLP encouraged pt to put some time into practicing AB and historically this has helped patients greatly. SLP reviewed visuatlization strategies and laryngeal control strategies with pt. SLP reiterated the use of written cues for each on a 4x6 card for practice and for cues when pt is in a spell. Pt is confident she can find time to practice AB between now and her next appointment. She thought she could do this from now until December 11th (her appointment next week). SLP agreed.  02/01/24: SLP worked with pt on AB and extending the breath to 4 seconds. Pt with initial cues necessary but independent with this after initial cues. SLP told pt to practice AB with 4 second inhale and 4 second exhale 12-15 minutes and then periodically throughout the day. Reviewed cough suppression strategies with pt and pt practiced with occasional min A necessary from SLP for procedure. SLP also reviewed  relaxation/visualization strategies and reiterated these will assist with cough or throat clear due to VCD. 6 throat clears today, but only 2 the last 34 minutes after SLP reviewed throat clear alternatives with pt 7 minutes into session.  01/11/24: SLP discussion with pt about caffeine intake - pt is having approx 100 mg/daily using a drink mix. SLP suggested pt decr this due to drying effect on vocal folds. Pt already taking Singulair  and Claritin  daily. SLP told pt goal is 50mg  daily. Three throat clears today.  SLP then worked with AB with pt today at rest. Pt with 95% success with demo by SLP initially. SLP told pt to practice this 10-25 minutes BID. SLP introduced pt to throat clear alternatives and provided handout (see pt instructions). SLP used demonstration and pt return demonstrated.   01/08/24: AB and throat clear alternatives next session. Today SLP educated pt about cough suppression strategies, provided handout. Pt to practice approx 5-10 minutes once/twice a day. SLP shared with pt about other visualizations - melting/evaporating, and larynx expanding like a grapefruit. Handout provided. SLP guided pt through brief practice with AB - will continue this next session.   01/03/24: Needs cough suppression strategies, visualization, and AB next session. SLP educated pt about VCD as well as glottic insufficiency. Explained PhoRTE eventually, and also worked with pt re: generating a visual image of relaxation for pt to use when she feels sx VCD begin. Lastly SLP taught pt 'sniff sniff - easy exhale with pursed lips' strategy for cough suppression.   PATIENT EDUCATION: Education details: see Treatment date Person educated: Patient Education method: Explanation, Demonstration, Verbal cues, and Handouts Education comprehension: verbalized understanding, returned demonstration, verbal cues required, and needs further education  HOME EXERCISE PROGRAM: Eventually PhoRTE and AB, and  visualization, and cough suppression   GOALS: Goals reviewed with patient? Yes  SHORT TERM GOALS: Target date: 02/09/24 (due to pt scheduling needs)  Pt will tell SLP of two breathing strategies, and two relaxation strategies to use for VCD in two sessions Baseline:02/12/24 Goal status: partially met  2.  Pt will decr caffeine intake to 50mg  to improve vocal hygiene Baseline:  Goal status: not met  3.  Pt will decr throat clears to < 3/ session x 2 sessions Baseline: 02/12/24 Goal status: partially met  4.  Pt will tell SLP 4 throat clear alternatives in 2 sessions Baseline:  Goal status: not met  5.  Pt will perform PhoRTE with rare min A in two sessions Baseline:  Goal status: deferred - not done yet   LONG TERM GOALS: Target date:  05/17/24  Pt will improve PROMs Baseline:  Goal status: INITIAL  2.  Pt will decr throat clears to <2 in two sessions Baseline:  Goal status: INITIAL  3.  Pt will decr caffeine intake to 30mg  to improve vocal hygiene Baseline: 03/27/24 Goal status: INITIAL  4.  Pt will perform PhoRTE with modified independence in two sessions Baseline:  Goal status: INITIAL  5.  Pt will report improvement as less frequent s/sx VCD than prior to ST Baseline:  Goal status: INITIAL  6.  Pt will use AB 80% in 10 minutes conversation, in two sessions Baseline:  Goal status: INITIAL  ASSESSMENT:  CLINICAL IMPRESSION: RECERT TODAY. Patient is a 69 y.o. F who was seen today for treatment of voice in light of both glottic insufficiency and VCD. Pt has had limited visits in 8 calendar weeks from evaluation date. See treatment date above for today's date for further details on today's session. During eval, pt cleared throat 10 times in 38-minutes. She will benefit further from ST by working to meet goals above for VCD and for glottal insufficiency.  OBJECTIVE IMPAIRMENTS: include voice disorder. These impairments are limiting patient from household  responsibilities, ADLs/IADLs, and effectively communicating at home and in community. Factors affecting potential to achieve goals and functional outcome are time post onset. Patient will benefit from skilled SLP services to address above impairments and improve overall function.  REHAB POTENTIAL: Good  PLAN:  SLP FREQUENCY: 1-2x/week  SLP DURATION: 19 total sessions  PLANNED INTERVENTIONS: Environmental controls, Internal/external aids, Oral motor exercises, Functional tasks, SLP instruction and feedback, Compensatory strategies, Patient/family education, [phone] Treatment of speech (30 or 45 min) , and voice exercises    Rendell Thivierge, CCC-SLP 03/27/2024, 11:45 PM      "

## 2024-03-28 ENCOUNTER — Encounter (INDEPENDENT_AMBULATORY_CARE_PROVIDER_SITE_OTHER): Payer: Self-pay | Admitting: Family Medicine

## 2024-03-28 ENCOUNTER — Other Ambulatory Visit (HOSPITAL_BASED_OUTPATIENT_CLINIC_OR_DEPARTMENT_OTHER): Payer: Self-pay

## 2024-03-28 ENCOUNTER — Encounter: Payer: Self-pay | Admitting: Diagnostic Neuroimaging

## 2024-03-28 ENCOUNTER — Ambulatory Visit (INDEPENDENT_AMBULATORY_CARE_PROVIDER_SITE_OTHER): Admitting: Family Medicine

## 2024-03-28 DIAGNOSIS — E669 Obesity, unspecified: Secondary | ICD-10-CM

## 2024-03-28 DIAGNOSIS — R519 Headache, unspecified: Secondary | ICD-10-CM

## 2024-03-28 DIAGNOSIS — I10 Essential (primary) hypertension: Secondary | ICD-10-CM | POA: Diagnosis not present

## 2024-03-28 DIAGNOSIS — R7303 Prediabetes: Secondary | ICD-10-CM

## 2024-03-28 DIAGNOSIS — Z6832 Body mass index (BMI) 32.0-32.9, adult: Secondary | ICD-10-CM

## 2024-03-28 DIAGNOSIS — E559 Vitamin D deficiency, unspecified: Secondary | ICD-10-CM

## 2024-03-28 MED ORDER — VITAMIN D (ERGOCALCIFEROL) 1.25 MG (50000 UNIT) PO CAPS
50000.0000 [IU] | ORAL_CAPSULE | ORAL | 1 refills | Status: AC
  Filled 2024-03-28: qty 4, 28d supply, fill #0

## 2024-03-28 NOTE — Progress Notes (Signed)
 "  Dana DOROTHA Mccoy, D.O.  ABFM, ABOM Specializing in Clinical Bariatric Medicine  Office located at: [address] [place], [place]  [address]    Review labs at next OV.   Orders Placed This Encounter  Procedures   VITAMIN D  25 Hydroxy (Vit-D Deficiency, Fractures)   Hemoglobin A1c   Insulin , random    Medications Discontinued During This Encounter  Medication Reason   Vitamin D , Ergocalciferol , (DRISDOL ) 1.25 MG (50000 UNIT) CAPS capsule Reorder     Meds ordered this encounter  Medications   Vitamin D , Ergocalciferol , (DRISDOL ) 1.25 MG (50000 UNIT) CAPS capsule    Sig: Take 1 capsule (50,000 Units total) by mouth every 7 (seven) days.    Dispense:  4 capsule    Refill:  1      A) FOR THE CHRONIC DISEASE OF OBESITY:  Chief complaint: Obesity Dana Mccoy is here to discuss her progress with her obesity treatment plan.   History of present illness / Interval history:  Dana Mccoy is here today for her follow-up office visit.  Since last OV on 02/21/24, pt is down 2 lbs. Patient states that the holiday foods were very rich for her. She has been busy with her son and his wife and their NICU baby in [place].    02/21/24 12:00 03/28/24 11:00   Body Fat % 43.3 % 43.9 %  Muscle Mass (lbs) 102.8 lbs 101 lbs  Fat Mass (lbs) 82.8 lbs 83.4 lbs  Total Body Water (lbs) 73 lbs 73.2 lbs  Visceral Fat Rating  13 13   Counseling done on how various foods will affect these numbers and how to maximize success.  Total lbs lost to date: - 8 lbs Total Fat Mass in lbs lost to date: - 8 lbs Total weight loss percentage to date: - 4.06 %    Morbid obesity (HCC) Starting BMI 33.8 Obesity (BMI 30-39.9) Current BMI 32.43 Nutrition Therapy She is Journaling 950 - 1050 cal and 80+ grams protein  and states she is following her eating plan approximately 60 % of the time.   - Tracking Calories/Macros: yes  - Eating More Whole Foods: yes  - Adequate Protein Intake: yes  - Adequate  Water Intake: yes  - Skipping Meals: yes  - Sleeping 7-9 Hours/ Night: yes   Dana Mccoy is currently in the action stage of change. As such, her goal is to continue weight management plan.  She has agreed to: continue current plan   Physical Activity Dana Mccoy is walking 45  minutes 6 days per week   Dana Mccoy has been advised to work up to 300-450 minutes of moderate intensity aerobic activity a week and strengthening exercises 2-3 times per week for cardiovascular health, weight loss maintenance and preservation of muscle mass.  She has agreed to : Increase volume of physical activity to a goal of 240 minutes a week and Combine aerobic and strengthening exercises for efficiency and improved cardiometabolic health.   Behavioral Modifications Evidence-based interventions for health behavior change were utilized today including the discussion of  1) self monitoring techniques:    - Continue journaling  Regarding patient's less desirable eating habits and patterns, we employed the technique of small changes.   We discussed the following today: continue to work on maintaining a reduced calorie state, getting the recommended amount of protein, incorporating whole foods, making healthy choices, staying well hydrated and practicing mindfulness when eating. Additional resources provided today: Handout on Daily Food Journaling Log  Medical Interventions/ Pharmacotherapy Previous Bariatric surgery: n/a Pharmacotherapy for weight loss: She is not currently taking medications  for medical weight loss.    We discussed various medication options to help Dana Mccoy with her weight loss efforts and we both agreed to : Continue with current nutritional and behavioral strategies   B) OBESITY RELATED CONDITIONS ADDRESSED TODAY:  Prediabetes Assessment & Plan Lab Results  Component Value Date   HGBA1C 5.8 (H) 11/20/2023   HGBA1C 5.7 (H) 07/19/2021   HGBA1C 5.7 (H) 04/20/2021   INSULIN  13.5 11/20/2023    INSULIN  11.4 04/20/2021    Diet and life style controlled. Patient states that her hunger and cravings are well controlled. Since patients last A1C labs on 11/20/23 her body fat percentage has increased and it is anticipated that her A1C will increase due to this. Will obtain labs today. Stressed to patient the importance of decreasing simple carbs and sugars. Will go over labs at next OV.     Primary hypertension Assessment & Plan BP Readings from Last 3 Encounters:  03/28/24 99/65  02/23/24 104/73  02/21/24 114/74   The 10-year ASCVD risk score (Arnett DK, et al., 2019) is: 6%  Lab Results  Component Value Date   CREATININE 0.81 11/20/2023   On hydrochlorothiazide  12.5 mg daily and Losartan  25 mg daily. With reported good compliance and tolerance. Patient BP today is at low normal. Patient is asx and with no concerns. Continue with medications. Reminded patient that as she loses weight her BP will lower and stabilize further.      Vitamin D  deficiency Assessment & Plan Lab Results  Component Value Date   VD25OH 26.9 (L) 11/20/2023   VD25OH 76.8 07/19/2021   VD25OH 29.2 (L) 04/20/2021   Taking ERGO 50K units once daily with reported good compliance and tolerance. Patient states that she has no issue remembering to take the supplement. She denies having any side effects. Will refill today. Will obtain labs today and review at next OV.     Frequent headaches Assessment & Plan Patient states that she was placed on Topiramate  by Dr.Penumalli. She reports taking 50 mg once at night since she feels very loopy and cannot keep her thoughts together. Since patient states that she is not tolerating medication well recommended that she take half a pill at night and if tolerating well increase to half a tab at night and half a tab in the morning. If she does not tolerate well recommended that she reach out to the provider that prescribed the medication for her. Will follow alongside.       Medications Discontinued During This Encounter  Medication Reason   Vitamin D , Ergocalciferol , (DRISDOL ) 1.25 MG (50000 UNIT) CAPS capsule Reorder     Meds ordered this encounter  Medications   Vitamin D , Ergocalciferol , (DRISDOL ) 1.25 MG (50000 UNIT) CAPS capsule    Sig: Take 1 capsule (50,000 Units total) by mouth every 7 (seven) days.    Dispense:  4 capsule    Refill:  1      Follow up:   Return 04/24/2024 at 12:20 PM  She was informed of the importance of frequent follow up visits to maximize her success with intensive lifestyle modifications for her multiple health conditions.   Weight Summary and Biometrics   Weight Lost Since Last Visit: 2lb  Weight Gained Since Last Visit: 0lb   Vitals Temp: 98.4 F (36.9 C) BP: 99/65 Pulse Rate: (!) 52 SpO2: 99 %   Anthropometric Measurements Height: 5'  4 (1.626 m) Weight: 189 lb (85.7 kg) BMI (Calculated): 32.43 Weight at Last Visit: 191lb Weight Lost Since Last Visit: 2lb Weight Gained Since Last Visit: 0lb Starting Weight: 197lb Total Weight Loss (lbs): 8 lb (3.629 kg) Peak Weight: 271lb   Body Composition  Body Fat %: 43.9 % Fat Mass (lbs): 83.4 lbs Muscle Mass (lbs): 101 lbs Total Body Water (lbs): 73.2 lbs Visceral Fat Rating : 13   Other Clinical Data Fasting: yes Labs: yes Today's Visit #: 6 Starting Date: 11/20/23    Objective:   PHYSICAL EXAM: Blood pressure 99/65, pulse (!) 52, temperature 98.4 F (36.9 C), height 5' 4 (1.626 m), weight 189 lb (85.7 kg), SpO2 99%. Body mass index is 32.44 kg/m.  General: she is overweight, cooperative and in no acute distress. PSYCH: Has normal mood, affect and thought process.   HEENT: EOMI, sclerae are anicteric. Lungs: Normal breathing effort, no conversational dyspnea. Extremities: Moves * 4 Neurologic: A and O * 3, good insight  DIAGNOSTIC DATA REVIEWED: BMET    Component Value Date/Time   NA 141 11/20/2023 1057   K 4.3 11/20/2023 1057    CL 101 11/20/2023 1057   CO2 24 11/20/2023 1057   GLUCOSE 76 11/20/2023 1057   GLUCOSE 81 11/18/2020 1557   GLUCOSE 77 01/24/2006 1211   BUN 19 11/20/2023 1057   CREATININE 0.81 11/20/2023 1057   CALCIUM  10.1 11/20/2023 1057   GFRNONAA >60 11/18/2020 1557   GFRAA >60 11/10/2019 0032   Lab Results  Component Value Date   HGBA1C 5.8 (H) 11/20/2023   HGBA1C 5.7 01/24/2006   Lab Results  Component Value Date   INSULIN  13.5 11/20/2023   INSULIN  11.4 04/20/2021   Lab Results  Component Value Date   TSH 1.67 01/26/2024   CBC    Component Value Date/Time   WBC 6.9 11/20/2023 1057   WBC 6.0 01/21/2021 1154   RBC 5.07 11/20/2023 1057   RBC 4.74 01/21/2021 1154   HGB 13.3 11/20/2023 1057   HCT 43.1 11/20/2023 1057   PLT 297 11/20/2023 1057   MCV 85 11/20/2023 1057   MCH 26.2 (L) 11/20/2023 1057   MCH 27.8 11/18/2020 1557   MCHC 30.9 (L) 11/20/2023 1057   MCHC 31.9 01/21/2021 1154   RDW 14.1 11/20/2023 1057   Iron Studies    Component Value Date/Time   IRON 62 09/06/2019 1420   TIBC 401 09/06/2019 1420   FERRITIN 16 09/06/2019 1420   IRONPCTSAT 15 (L) 09/06/2019 1420   Lipid Panel     Component Value Date/Time   CHOL 196 11/20/2023 1057   CHOL 260 (H) 09/05/2014 1026   TRIG 198 (H) 11/20/2023 1057   TRIG 161 (H) 09/05/2014 1026   HDL 61 11/20/2023 1057   HDL 59 09/05/2014 1026   CHOLHDL 3.2 11/20/2023 1057   CHOLHDL 5 09/06/2019 1420   VLDL 47.0 (H) 09/06/2019 1420   LDLCALC 101 (H) 11/20/2023 1057   LDLCALC 169 (H) 09/05/2014 1026   LDLDIRECT 150.0 09/06/2019 1420   Hepatic Function Panel     Component Value Date/Time   PROT 7.0 11/20/2023 1057   ALBUMIN 4.5 11/20/2023 1057   AST 22 11/20/2023 1057   ALT 11 11/20/2023 1057   ALKPHOS 128 (H) 11/20/2023 1057   BILITOT 0.5 11/20/2023 1057   BILIDIR 0.0 09/05/2014 1026      Component Value Date/Time   TSH 1.67 01/26/2024 1127   Nutritional Lab Results  Component Value Date   VD25OH 26.9 (  L)  11/20/2023   VD25OH 76.8 07/19/2021   VD25OH 29.2 (L) 04/20/2021    Attestations:   LILLETTE Dana Mccoy, acting as a medical scribe for Dana Jenkins, DO., have compiled all relevant documentation for today's office visit on behalf of Dana Jenkins, DO, while in the presence of Dana & Mclennan, DO.  I have reviewed the above documentation for accuracy and completeness, and I agree with the above. Dana Mccoy, D.O.  The 21st Century Cures Act was signed into law in 2016 which includes the topic of electronic health records.  This provides immediate access to information in MyChart.  This includes consultation notes, operative notes, office notes, lab results and pathology reports.  If you have any questions about what you read please let us  know at your next visit so we can discuss your concerns and take corrective action if need be.  We are right here with you.  "

## 2024-03-29 ENCOUNTER — Other Ambulatory Visit (HOSPITAL_BASED_OUTPATIENT_CLINIC_OR_DEPARTMENT_OTHER): Payer: Self-pay

## 2024-03-29 ENCOUNTER — Other Ambulatory Visit: Payer: Self-pay

## 2024-03-29 LAB — HEMOGLOBIN A1C
Est. average glucose Bld gHb Est-mCnc: 117 mg/dL
Hgb A1c MFr Bld: 5.7 % — ABNORMAL HIGH (ref 4.8–5.6)

## 2024-03-29 LAB — VITAMIN D 25 HYDROXY (VIT D DEFICIENCY, FRACTURES): Vit D, 25-Hydroxy: 48.1 ng/mL (ref 30.0–100.0)

## 2024-03-29 LAB — INSULIN, RANDOM: INSULIN: 14.7 u[IU]/mL (ref 2.6–24.9)

## 2024-03-29 MED ORDER — QULIPTA 60 MG PO TABS
60.0000 mg | ORAL_TABLET | Freq: Every day | ORAL | 6 refills | Status: AC
  Filled 2024-03-29: qty 30, 30d supply, fill #0

## 2024-03-29 NOTE — Telephone Encounter (Signed)
 Please advise on medication decrease

## 2024-04-01 ENCOUNTER — Other Ambulatory Visit: Payer: Self-pay

## 2024-04-01 ENCOUNTER — Other Ambulatory Visit (HOSPITAL_COMMUNITY): Payer: Self-pay

## 2024-04-01 NOTE — Progress Notes (Signed)
 Benefits Investigation Started  Reason: Copay is now $1,206.16  Routed to: Tiffany

## 2024-04-03 ENCOUNTER — Other Ambulatory Visit: Payer: Self-pay

## 2024-04-03 ENCOUNTER — Other Ambulatory Visit (HOSPITAL_BASED_OUTPATIENT_CLINIC_OR_DEPARTMENT_OTHER): Payer: Self-pay

## 2024-04-03 NOTE — Progress Notes (Signed)
 Specialty Pharmacy Refill Coordination Note  Dana Mccoy is a 69 y.o. female contacted today regarding refills of specialty medication(s) Dupilumab  (Dupixent )   Patient requested [name] at [facility] date: 04/04/24   Medication will be filled on: 04/03/24

## 2024-04-04 ENCOUNTER — Other Ambulatory Visit: Payer: Self-pay

## 2024-04-04 ENCOUNTER — Other Ambulatory Visit (HOSPITAL_BASED_OUTPATIENT_CLINIC_OR_DEPARTMENT_OTHER): Payer: Self-pay

## 2024-04-04 ENCOUNTER — Ambulatory Visit

## 2024-04-04 ENCOUNTER — Encounter (INDEPENDENT_AMBULATORY_CARE_PROVIDER_SITE_OTHER): Payer: Self-pay

## 2024-04-04 DIAGNOSIS — R498 Other voice and resonance disorders: Secondary | ICD-10-CM

## 2024-04-04 MED ORDER — FLUTICASONE-SALMETEROL 230-21 MCG/ACT IN AERO
2.0000 | INHALATION_SPRAY | Freq: Two times a day (BID) | RESPIRATORY_TRACT | 12 refills | Status: AC
  Filled 2024-04-04: qty 12, 30d supply, fill #0

## 2024-04-04 NOTE — Therapy (Signed)
 " OUTPATIENT SPEECH LANGUAGE PATHOLOGY VOICE TREATMENT   Patient Name: Dana Mccoy MRN: [id number] DOB:[birth year], 69 y.o., female Today's Date: 04/04/2024  PCP: Geofm Hastings, MD REFERRING PROVIDER: Malachy Crank, MD  END OF SESSION:  End of Session - 04/04/24 1017     Visit Number 8    Number of Visits 19    Date for Recertification  05/17/24    SLP Start Time 0940    SLP Stop Time  1015    SLP Time Calculation (min) 35 min    Activity Tolerance Patient tolerated treatment well              Past Medical History:  Diagnosis Date   Anemia    Angio-edema    Anxiety    Asthma due to environmental allergies    Asthma exacerbation, mild 02/12/2020   Back pain    Cervical stenosis of spine    Constipation    Dysphonia    Frequent headaches    GERD (gastroesophageal reflux disease)    Hashimoto's disease    Hyperlipidemia    Hypertension    IBS (irritable bowel syndrome)    Joint pain    Lactose intolerance    Metabolic syndrome    Multiple food allergies    Obesity    Osteoarthritis    Osteopenia    Other fatigue    Palpitations    Periodontal disease    Prediabetes    Reactive airway disease    post CAP   Shortness of breath    Shortness of breath on exertion    Swallowing difficulty    Past Surgical History:  Procedure Laterality Date   CARDIAC CATHETERIZATION  1997   negative   COLONOSCOPY  2007   Dr Donnald   DILATION AND CURETTAGE OF UTERUS     UPPER GI ENDOSCOPY     X 2   WISDOM TOOTH EXTRACTION     Patient Active Problem List   Diagnosis Date Noted   Mild obstructive sleep apnea in adult 12/28/2023   Vitamin D  deficiency 12/28/2023   Soft tissue swelling of chest wall 12/20/2023   Orthostatic dizziness 11/07/2023   Hypothyroidism due to Hashimoto thyroiditis 10/11/2023   Thyroid  nodule 10/11/2023   Osteopenia 10/11/2023   SOB (shortness of breath) 10/11/2023   Vocal cord dysfunction 10/11/2023   Soreness of tongue 11/10/2021    Upper respiratory tract infection 05/26/2021   Prediabetes 04/20/2021   Hypertension 04/20/2021   Other irritable bowel syndrome 04/20/2021   Severe asthma without complication 02/12/2020   Adverse food reaction 01/30/2020   Angio-edema 01/17/2020   Frequent headaches 01/17/2020   DDD (degenerative disc disease), cervical 09/24/2019   Numbness and tingling 09/06/2019   Tremor 09/06/2019   Restless leg syndrome 07/27/2018   Excessive daytime sleepiness 08/21/2017   Bradycardia 08/21/2017   Obesity (BMI 30-39.9) 08/02/2013   Other allergic rhinitis 08/02/2013   Cervical stenosis of spine 04/26/2011   Hyperlipidemia 06/05/2007   Reactive airway disease 06/05/2007   Gastroesophageal reflux disease 06/05/2007   Speech Therapy Progress Note  Dates of Reporting Period: 01/03/24 to present  Subjective Statement: Pt has been seen for 7 voice therapy sessions focusing on VCD sx, and glottal insufficiency.  Objective: See below.  Goal Update: See below  Plan: See pt for approx 12 more sessions. Due to illness and holidays she has not been seen consistently.   Reason Skilled Services are Required: Rehab potential not reached.    Onset date: >12 months  ago; script 11/07/23  REFERRING DIAG: J38.3 (ICD-10-CM) - Vocal cord dysfunction R13.10 (ICD-10-CM) - Dysphagia, unspecified type  THERAPY DIAG:  Other voice and resonance disorders  Rationale for Evaluation and Treatment: Rehabilitation  SUBJECTIVE:   SUBJECTIVE STATEMENT: I just got back and was in NICU so I didn't think about the (PhoRTE) sheet for about a week.  Pt accompanied by: self  PERTINENT HISTORY:  DR. MALACHY - 11/07/23 Discussed the use of AI scribe software for clinical note transcription with the patient, who gave verbal consent to proceed.   History of Present Illness Dana Mccoy is a 69 year old female who presents with excessive daytime fatigue. She was referred by her primary doctor for a sleep  study due to excessive daytime fatigue.   She experiences excessive daytime fatigue despite being on levothyroxine  for about a year, which has made some difference but not resolved the issue. Her TSH levels have normalized. A sleep study five years ago was negative for sleep apnea. There have been no major changes in her weight since. No drowsy driving, but she avoids long trips alone. She occasionally wakes up gasping and has been told she snores in the past. No sleepwalking or sleep paralysis, but sometimes wakes up feeling disoriented. She wakes up multiple times a night, varying from three to five times, and sometimes has trouble falling asleep. She takes 5 mg of melatonin, which she feels helps. She experiences restless legs occasionally but has never taken medication for it. Doesn't feel like it causes significant problems. Occasional morning headaches.    She has a history of asthma and VCD. She sees Dr. Meade for follow up next month. She has noticed more frequent and severe asthma problems in the last couple of years. She's been stable since her last visit. She also reports having a hoarse voice chronically, which led to an ENT consultation where vocal cord dysfunction was noted. She briefly saw speech therapy when she lived in [place]. She occasionally has issues with swallowing and feels like it goes down the wrong way. She has not had any recent pneumonias or recurrent infections. She had an upper endoscopy last year which showed gastric polyps but no other significant findings. No changes in bowel habits, weight loss, bloody stools, abdominal pain. No fevers, hemoptysis.    She has a history of a low resting heart rate in the low fifties, which she considers problematic. She sometimes experiences dizziness or lightheadedness with changes in position. She's seeing a cardiologist regarding this. No chest pains, syncope.   PAIN:  Are you having pain? No  FALLS: Has patient fallen in last 6 months?  No   PATIENT GOALS: Decrease frequency of cough  OBJECTIVE:  Note: Objective measures were completed at Evaluation unless otherwise noted.   PATIENT REPORTED OUTCOME MEASURES (PROM): [place] Cough Questionnaire (LCQ): pt returned LCQ and scored 85 with A good bit of the time (3) was indicated for bothered by phlegm production during cough, tired because of your cough, embarrassed by your cough, how frequently have you had coughing bouts, concerned about other people think something is wrong with you, cough has interrupted conversations or phone calls, and cough has annoyed my family or friends. She answered most of the time (2) for my cough has made me feel frustrated, and my cough has made me feel fed up.  TREATMENT DATE:  Abdominal breathing=AB. Phonatory Resistance Treatment Exercises=PhoRTE. Resonant Voice therapy (RVT)  04/04/24: No throat clears. SLP reviewed /ah/ and vocal glide for strengthening vocal folds and add bulk to pt's vocal folds. Pt averaged ~79 dB during sustained /ah/ given occasional min A for adding volume. Pitch glides averaged 81 dB given occasional min A. SLP introduced Modified Resonant Voice Therapy (RVT) to increase pt's forward-focused resonance to improve balanced phonation. SLP guided pt through humming on /m/ and bringing awareness to front vs. Back resonance. Pt was required to say /m/ words, phrases, and sentences with her PhoRTE voice and forward resonance. Pt achieved WNL voice quality in 80% of opportunities during /m/ word, phrase, and sentence readings given frequent min A. Pt benefits from cues focused on increasing abdominal breathing. Pt notes that she continues to have difficulty with being consistent with AB throughout the day. Pt was requires to read initial voiceless and voiced words to optimize breath engagement during onset  of phonation. Pt was 90% consistent with achieving balanced vocal onset during voiced vs. Voiceless words given rare min A. SLP provided RVT phrases for pt to add to HEP for minimizing sx of vocal fold attropy/glottic insufficiency. Plan is to continue PhoRTE and other prevalent voice strategies for optimizing pt's balanced phonation.   03/27/24: One throat clear. SLP used PhoRTE today to strengthen vocal folds and add bulk to pt's vocal folds demonstrating reduced mass/atrophy due to age-related changes. /a/ average 81dB with initial cue to add volume. Pitch glides averaged 82dB, high pitched sentences average 82dB, and low-pitched 80dB with more rough sounding voice in lower pitches.Today SLP worked with pt on AB and she req'd mod-max A with question and answer tasks using AB - success was 30%. SLP told pt to cont to practice AB 15 minutes every day until next session.   03/12/24: One throat clear today in session. SLP used PhoRTE today to strengthen vocal folds and add bulk to pt's vocal folds demonstrating reduced mass/atrophy due to age-related changes. /a/ average 83dB, Pitch glides averaged 84dB, high pitched sentences average 83dB, and low-pitched 81dB with more rough sounding voice.Today SLP worked with pt on AB at rest and she req'd usual min-mod A faded to occasional min A at rest and with vowel vocalization. SLP told pt to cont to practice AB 15 minutes BID, and complete PhoRTE.   03/02/24:  SLP emailed pt links and instructions for PhoRTE. Today SLP educated about rationale for PhoRTE and then demo'd each exercise for pt and ensured she performed correctly prior to moving to next exercise. Pt req'd consistent model for all exercises, and additional usual mod A for numbers (immediate drop off from high to low pitch instead of gradual drop off). SLP stressed pt must complete BID for 6-8 weeks for best chance to reduce glottal gap due to vocal fold atrophy. Pt was encouraged to cont with AB practice,  and use of strategies for minimizing effect of cough/throat clear. Pt cleared throat x2 today.   02/12/24: Consider PhoRTE next session. One throat clear today. Pt with inconsistent practice with AB due to family and holiday schedule in the last two weeks. Brendi feels like throat clear/cough has increased in frequency since last session. SLP educated pt (re-educated pt) on triggers and heightened emotion can be one trigger.  SLP encouraged pt to put some time into practicing AB and historically this has helped patients greatly. SLP reviewed visuatlization strategies and laryngeal control strategies with pt. SLP reiterated the use  of written cues for each on a 4x6 card for practice and for cues when pt is in a spell. Pt is confident she can find time to practice AB between now and her next appointment. She thought she could do this from now until December 11th (her appointment next week). SLP agreed.  02/01/24: SLP worked with pt on AB and extending the breath to 4 seconds. Pt with initial cues necessary but independent with this after initial cues. SLP told pt to practice AB with 4 second inhale and 4 second exhale 12-15 minutes and then periodically throughout the day. Reviewed cough suppression strategies with pt and pt practiced with occasional min A necessary from SLP for procedure. SLP also reviewed relaxation/visualization strategies and reiterated these will assist with cough or throat clear due to VCD. 6 throat clears today, but only 2 the last 34 minutes after SLP reviewed throat clear alternatives with pt 7 minutes into session.  01/11/24: SLP discussion with pt about caffeine intake - pt is having approx 100 mg/daily using a drink mix. SLP suggested pt decr this due to drying effect on vocal folds. Pt already taking Singulair  and Claritin  daily. SLP told pt goal is 50mg  daily. Three throat clears today.  SLP then worked with AB with pt today at rest. Pt with 95% success with demo by SLP initially.  SLP told pt to practice this 10-25 minutes BID. SLP introduced pt to throat clear alternatives and provided handout (see pt instructions). SLP used demonstration and pt return demonstrated.   01/08/24: AB and throat clear alternatives next session. Today SLP educated pt about cough suppression strategies, provided handout. Pt to practice approx 5-10 minutes once/twice a day. SLP shared with pt about other visualizations - melting/evaporating, and larynx expanding like a grapefruit. Handout provided. SLP guided pt through brief practice with AB - will continue this next session.   01/03/24: Needs cough suppression strategies, visualization, and AB next session. SLP educated pt about VCD as well as glottic insufficiency. Explained PhoRTE eventually, and also worked with pt re: generating a visual image of relaxation for pt to use when she feels sx VCD begin. Lastly SLP taught pt 'sniff sniff - easy exhale with pursed lips' strategy for cough suppression.   PATIENT EDUCATION: Education details: see Treatment date Person educated: Patient Education method: Explanation, Demonstration, Verbal cues, and Handouts Education comprehension: verbalized understanding, returned demonstration, verbal cues required, and needs further education  HOME EXERCISE PROGRAM: Eventually PhoRTE and AB, and visualization, and cough suppression   GOALS: Goals reviewed with patient? Yes  SHORT TERM GOALS: Target date: 02/09/24 (due to pt scheduling needs)  Pt will tell SLP of two breathing strategies, and two relaxation strategies to use for VCD in two sessions Baseline:02/12/24 Goal status: partially met  2.  Pt will decr caffeine intake to 50mg  to improve vocal hygiene Baseline:  Goal status: not met  3.  Pt will decr throat clears to < 3/ session x 2 sessions Baseline: 02/12/24 Goal status: partially met  4.  Pt will tell SLP 4 throat clear alternatives in 2 sessions Baseline:  Goal status: not met  5.  Pt  will perform PhoRTE with rare min A in two sessions Baseline:  Goal status: deferred - not done yet   LONG TERM GOALS: Target date:  05/17/24  Pt will improve PROMs Baseline:  Goal status: INITIAL  2.  Pt will decr throat clears to <2 in two sessions Baseline:  Goal status: INITIAL  3.  Pt will decr caffeine intake to 30mg  to improve vocal hygiene Baseline: 03/27/24 Goal status: INITIAL  4.  Pt will perform PhoRTE with modified independence in two sessions Baseline:  Goal status: INITIAL  5.  Pt will report improvement as less frequent s/sx VCD than prior to ST Baseline:  Goal status: INITIAL  6.  Pt will use AB 80% in 10 minutes conversation, in two sessions Baseline:  Goal status: INITIAL  ASSESSMENT:  CLINICAL IMPRESSION: RECERT TODAY. Patient is a 69 y.o. F who was seen today for treatment of voice in light of both glottic insufficiency and VCD. Pt has had limited visits in 8 calendar weeks from evaluation date. See treatment date above for today's date for further details on today's session. During eval, pt cleared throat 10 times in 38-minutes. She will benefit further from ST by working to meet goals above for VCD and for glottal insufficiency.  OBJECTIVE IMPAIRMENTS: include voice disorder. These impairments are limiting patient from household responsibilities, ADLs/IADLs, and effectively communicating at home and in community. Factors affecting potential to achieve goals and functional outcome are time post onset. Patient will benefit from skilled SLP services to address above impairments and improve overall function.  REHAB POTENTIAL: Good  PLAN:  SLP FREQUENCY: 1-2x/week  SLP DURATION: 19 total sessions  PLANNED INTERVENTIONS: Environmental controls, Internal/external aids, Oral motor exercises, Functional tasks, SLP instruction and feedback, Compensatory strategies, Patient/family education, [phone] Treatment of speech (30 or 45 min) , and voice  exercises    Waddell [employer], M.A., CF-SLP 04/04/2024, 10:25 AM      "

## 2024-04-05 ENCOUNTER — Other Ambulatory Visit: Payer: Self-pay

## 2024-04-06 ENCOUNTER — Other Ambulatory Visit (HOSPITAL_BASED_OUTPATIENT_CLINIC_OR_DEPARTMENT_OTHER): Payer: Self-pay

## 2024-04-08 ENCOUNTER — Ambulatory Visit

## 2024-04-12 ENCOUNTER — Ambulatory Visit

## 2024-04-15 ENCOUNTER — Encounter: Payer: Self-pay | Admitting: Internal Medicine

## 2024-04-15 ENCOUNTER — Ambulatory Visit: Admitting: Internal Medicine

## 2024-04-15 ENCOUNTER — Telehealth: Payer: Self-pay

## 2024-04-15 NOTE — Telephone Encounter (Signed)
 Called and left voicemail letting Pt know to give the office a callback to reschedule her appointment.

## 2024-04-15 NOTE — Patient Instructions (Addendum)
" ° ° ° ° °  Blood work was ordered.       Medications changes include :   None    A referral was ordered and someone will call you to schedule an appointment.     Return in about 6 months (around 10/14/2024) for Physical Exam.  "

## 2024-04-16 ENCOUNTER — Ambulatory Visit: Admitting: Internal Medicine

## 2024-04-16 ENCOUNTER — Ambulatory Visit: Payer: Self-pay | Admitting: Internal Medicine

## 2024-04-16 ENCOUNTER — Ambulatory Visit

## 2024-04-16 VITALS — BP 110/60 | HR 63 | Temp 98.2°F | Ht 64.0 in | Wt 194.0 lb

## 2024-04-16 DIAGNOSIS — R7303 Prediabetes: Secondary | ICD-10-CM

## 2024-04-16 DIAGNOSIS — J4550 Severe persistent asthma, uncomplicated: Secondary | ICD-10-CM

## 2024-04-16 DIAGNOSIS — F418 Other specified anxiety disorders: Secondary | ICD-10-CM

## 2024-04-16 DIAGNOSIS — G4733 Obstructive sleep apnea (adult) (pediatric): Secondary | ICD-10-CM

## 2024-04-16 DIAGNOSIS — E559 Vitamin D deficiency, unspecified: Secondary | ICD-10-CM

## 2024-04-16 DIAGNOSIS — E669 Obesity, unspecified: Secondary | ICD-10-CM

## 2024-04-16 DIAGNOSIS — E7800 Pure hypercholesterolemia, unspecified: Secondary | ICD-10-CM

## 2024-04-16 DIAGNOSIS — E063 Autoimmune thyroiditis: Secondary | ICD-10-CM

## 2024-04-16 DIAGNOSIS — K219 Gastro-esophageal reflux disease without esophagitis: Secondary | ICD-10-CM

## 2024-04-16 DIAGNOSIS — E2839 Other primary ovarian failure: Secondary | ICD-10-CM

## 2024-04-16 DIAGNOSIS — I10 Essential (primary) hypertension: Secondary | ICD-10-CM

## 2024-04-16 DIAGNOSIS — M85851 Other specified disorders of bone density and structure, right thigh: Secondary | ICD-10-CM

## 2024-04-16 LAB — COMPREHENSIVE METABOLIC PANEL WITH GFR
ALT: 13 U/L (ref 3–35)
AST: 20 U/L (ref 5–37)
Albumin: 4.3 g/dL (ref 3.5–5.2)
Alkaline Phosphatase: 89 U/L (ref 39–117)
BUN: 25 mg/dL — ABNORMAL HIGH (ref 6–23)
CO2: 30 meq/L (ref 19–32)
Calcium: 9.8 mg/dL (ref 8.4–10.5)
Chloride: 103 meq/L (ref 96–112)
Creatinine, Ser: 0.76 mg/dL (ref 0.40–1.20)
GFR: 80.25 mL/min
Glucose, Bld: 83 mg/dL (ref 70–99)
Potassium: 3.5 meq/L (ref 3.5–5.1)
Sodium: 142 meq/L (ref 135–145)
Total Bilirubin: 0.5 mg/dL (ref 0.2–1.2)
Total Protein: 7 g/dL (ref 6.0–8.3)

## 2024-04-16 LAB — CBC
HCT: 38.7 % (ref 36.0–46.0)
Hemoglobin: 12.2 g/dL (ref 12.0–15.0)
MCHC: 31.7 g/dL (ref 30.0–36.0)
MCV: 77.5 fl — ABNORMAL LOW (ref 78.0–100.0)
Platelets: 271 10*3/uL (ref 150.0–400.0)
RBC: 4.99 Mil/uL (ref 3.87–5.11)
RDW: 15.2 % (ref 11.5–15.5)
WBC: 6.7 10*3/uL (ref 4.0–10.5)

## 2024-04-16 LAB — LIPID PANEL
Cholesterol: 146 mg/dL (ref 28–200)
HDL: 50.3 mg/dL
LDL Cholesterol: 56 mg/dL (ref 10–99)
NonHDL: 95.25
Total CHOL/HDL Ratio: 3
Triglycerides: 198 mg/dL — ABNORMAL HIGH (ref 10.0–149.0)
VLDL: 39.6 mg/dL (ref 0.0–40.0)

## 2024-04-16 NOTE — Assessment & Plan Note (Addendum)
 Chronic Lab Results  Component Value Date   LDLCALC 101 (H) 11/20/2023    Check CMP, lipid Continue Crestor  5 mg daily

## 2024-04-16 NOTE — Assessment & Plan Note (Addendum)
 Chronic Controlled Following with pulmonary On Advair  HFA 2 puffs twice daily, Spiriva  2 puffs daily, Airsupra  as needed On Dupixent , Flonase , Claritin , Singulair 

## 2024-04-16 NOTE — Assessment & Plan Note (Signed)
Chronic Controlled, Stable Continue fluoxetine 20 mg daily 

## 2024-04-16 NOTE — Assessment & Plan Note (Addendum)
 Chronic Following with pulmonary Using cpap nightly - still having some leaks - may need a new maske

## 2024-04-18 ENCOUNTER — Ambulatory Visit: Attending: Nurse Practitioner

## 2024-04-18 DIAGNOSIS — R498 Other voice and resonance disorders: Secondary | ICD-10-CM

## 2024-04-18 NOTE — Therapy (Unsigned)
 " OUTPATIENT SPEECH LANGUAGE PATHOLOGY VOICE TREATMENT   Patient Name: Dana Mccoy MRN: [id number] DOB:November 13, 1955, 69 y.o., female Today's Date: 04/19/2024   PCP: Dana Hastings, MD REFERRING PROVIDER: Malachy Crank, MD  END OF SESSION:  End of Session - 04/18/24 1446     Visit Number 9    Number of Visits 19    Date for Recertification  05/17/24    SLP Start Time 1404    SLP Stop Time  1444    SLP Time Calculation (min) 40 min    Activity Tolerance Patient tolerated treatment well               Past Medical History:  Diagnosis Date   Anemia    Angio-edema    Anxiety    Asthma due to environmental allergies    Asthma exacerbation, mild 02/12/2020   Back pain    Cervical stenosis of spine    Constipation    Dysphonia    Frequent headaches    GERD (gastroesophageal reflux disease)    Hashimoto's disease    Hyperlipidemia    Hypertension    IBS (irritable bowel syndrome)    Joint pain    Lactose intolerance    Metabolic syndrome    Multiple food allergies    Obesity    Osteoarthritis    Osteopenia    Other fatigue    Palpitations    Periodontal disease    Prediabetes    Reactive airway disease    post CAP   Shortness of breath    Shortness of breath on exertion    Swallowing difficulty    Past Surgical History:  Procedure Laterality Date   CARDIAC CATHETERIZATION  1997   negative   COLONOSCOPY  2007   Dr Donnald   DILATION AND CURETTAGE OF UTERUS     UPPER GI ENDOSCOPY     X 2   WISDOM TOOTH EXTRACTION     Patient Active Problem List   Diagnosis Date Noted   Mild obstructive sleep apnea in adult 12/28/2023   Vitamin D  deficiency 12/28/2023   Soft tissue swelling of chest wall 12/20/2023   Orthostatic dizziness 11/07/2023   Hypothyroidism due to Hashimoto thyroiditis 10/11/2023   Thyroid  nodule 10/11/2023   Osteopenia 10/11/2023   SOB (shortness of breath) 10/11/2023   Vocal cord dysfunction 10/11/2023   Anxiety disorder,  unspecified 03/28/2023   Prediabetes 04/20/2021   Hypertension 04/20/2021   Other irritable bowel syndrome 04/20/2021   Severe asthma without complication 02/12/2020   Adverse food reaction 01/30/2020   Angio-edema 01/17/2020   Frequent headaches 01/17/2020   DDD (degenerative disc disease), cervical 09/24/2019   Tremor 09/06/2019   Restless leg syndrome 07/27/2018   Excessive daytime sleepiness 08/21/2017   Bradycardia 08/21/2017   Obesity (BMI 30-39.9) 08/02/2013   Other allergic rhinitis 08/02/2013   Cervical stenosis of spine 04/26/2011   Hypercholesterolemia 06/05/2007   Gastroesophageal reflux disease 06/05/2007   Speech Therapy Progress Note  Dates of Reporting Period: 01/03/24 to present  Subjective Statement: Pt has been seen for 7 voice therapy sessions focusing on VCD sx, and glottal insufficiency.  Objective: See below.  Goal Update: See below  Plan: See pt for approx 12 more sessions. Due to illness and holidays she has not been seen consistently.   Reason Skilled Services are Required: Rehab potential not reached.    Onset date: >12 months ago; script 11/07/23  REFERRING DIAG: J38.3 (ICD-10-CM) - Vocal cord dysfunction R13.10 (ICD-10-CM) - Dysphagia, unspecified  type  THERAPY DIAG:  Other voice and resonance disorders  Rationale for Evaluation and Treatment: Rehabilitation  SUBJECTIVE:   SUBJECTIVE STATEMENT: I just got back and was in NICU so I didn't think about the (PhoRTE) sheet for about a week.  Pt accompanied by: self  PERTINENT HISTORY:  DR. MALACHY - 11/07/23 Discussed the use of AI scribe software for clinical note transcription with the patient, who gave verbal consent to proceed.   History of Present Illness Dana Mccoy is a 69 year old female who presents with excessive daytime fatigue. She was referred by her primary doctor for a sleep study due to excessive daytime fatigue.   She experiences excessive daytime fatigue despite  being on levothyroxine  for about a year, which has made some difference but not resolved the issue. Her TSH levels have normalized. A sleep study five years ago was negative for sleep apnea. There have been no major changes in her weight since. No drowsy driving, but she avoids long trips alone. She occasionally wakes up gasping and has been told she snores in the past. No sleepwalking or sleep paralysis, but sometimes wakes up feeling disoriented. She wakes up multiple times a night, varying from three to five times, and sometimes has trouble falling asleep. She takes 5 mg of melatonin, which she feels helps. She experiences restless legs occasionally but has never taken medication for it. Doesn't feel like it causes significant problems. Occasional morning headaches.    She has a history of asthma and VCD. She sees Dr. Meade for follow up next month. She has noticed more frequent and severe asthma problems in the last couple of years. She's been stable since her last visit. She also reports having a hoarse voice chronically, which led to an ENT consultation where vocal cord dysfunction was noted. She briefly saw speech therapy when she lived in [place]. She occasionally has issues with swallowing and feels like it goes down the wrong way. She has not had any recent pneumonias or recurrent infections. She had an upper endoscopy last year which showed gastric polyps but no other significant findings. No changes in bowel habits, weight loss, bloody stools, abdominal pain. No fevers, hemoptysis.    She has a history of a low resting heart rate in the low fifties, which she considers problematic. She sometimes experiences dizziness or lightheadedness with changes in position. She's seeing a cardiologist regarding this. No chest pains, syncope.   PAIN:  Are you having pain? No  FALLS: Has patient fallen in last 6 months? No   PATIENT GOALS: Decrease frequency of cough  OBJECTIVE:  Note: Objective measures were  completed at Evaluation unless otherwise noted.   PATIENT REPORTED OUTCOME MEASURES (PROM): [place] Cough Questionnaire (LCQ): pt returned LCQ and scored 85 with A good bit of the time (3) was indicated for bothered by phlegm production during cough, tired because of your cough, embarrassed by your cough, how frequently have you had coughing bouts, concerned about other people think something is wrong with you, cough has interrupted conversations or phone calls, and cough has annoyed my family or friends. She answered most of the time (2) for my cough has made me feel frustrated, and my cough has made me feel fed up.  TREATMENT DATE:  Abdominal breathing=AB. Phonatory Resistance Treatment Exercises=PhoRTE. Resonant Voice therapy (RVT)  04/18/24: Pt notes that her voice practice has been consistent; however, she lost her practice sheet recently. SLP reviewed /ah/ for strengthening vocal folds and add bulk to pt's vocal folds. Pt averaged ~79 dB during sustained /ah/ given rare min A. SLP reviewed Modified Resonant Voice Therapy (RVT) to increase pt's forward-focused resonance to improve balanced phonation. Pt was required to say /m/ words, phrases, and sentences with her PhoRTE voice and forward resonance. Pt achieved WNL voice quality in 80% of opportunities during /m/ phrases and sentence readings given frequent min A. Pt benefits from cues focused on increasing abdominal breathing. Pt notes that she continues to have difficulty with being consistent with AB throughout the day. Conversation Training Therapy (CTT) was utilized to integrate pt's voice strategies for glottic insufficiency into the conversation level. During unstructured/structured conversation, pt achieved balanced phonation in 75% of opportunities given frequent min A. SLP provided cues for kinesthetic awareness  to optimize pt's self-awareness of balanced phonation vs. unbalanced phonation. Plan is to continue PhoRTE and other prevalent voice strategies for optimizing pt's balanced phonation.   04/04/24: No throat clears. SLP reviewed /ah/ and vocal glide for strengthening vocal folds and add bulk to pt's vocal folds. Pt averaged ~79 dB during sustained /ah/ given occasional min A for adding volume. Pitch glides averaged 81 dB given occasional min A. SLP introduced Modified Resonant Voice Therapy (RVT) to increase pt's forward-focused resonance to improve balanced phonation. SLP guided pt through humming on /m/ and bringing awareness to front vs. Back resonance. Pt was required to say /m/ words, phrases, and sentences with her PhoRTE voice and forward resonance. Pt achieved WNL voice quality in 80% of opportunities during /m/ word, phrase, and sentence readings given frequent min A. Pt benefits from cues focused on increasing abdominal breathing. Pt notes that she continues to have difficulty with being consistent with AB throughout the day. Pt was requires to read initial voiceless and voiced words to optimize breath engagement during onset of phonation. Pt was 90% consistent with achieving balanced vocal onset during voiced vs. Voiceless words given rare min A. SLP provided RVT phrases for pt to add to HEP for minimizing sx of vocal fold attropy/glottic insufficiency. Plan is to continue PhoRTE and other prevalent voice strategies for optimizing pt's balanced phonation.   03/27/24: One throat clear. SLP used PhoRTE today to strengthen vocal folds and add bulk to pt's vocal folds demonstrating reduced mass/atrophy due to age-related changes. /a/ average 81dB with initial cue to add volume. Pitch glides averaged 82dB, high pitched sentences average 82dB, and low-pitched 80dB with more rough sounding voice in lower pitches.Today SLP worked with pt on AB and she req'd mod-max A with question and answer tasks using AB -  success was 30%. SLP told pt to cont to practice AB 15 minutes every day until next session.   03/12/24: One throat clear today in session. SLP used PhoRTE today to strengthen vocal folds and add bulk to pt's vocal folds demonstrating reduced mass/atrophy due to age-related changes. /a/ average 83dB, Pitch glides averaged 84dB, high pitched sentences average 83dB, and low-pitched 81dB with more rough sounding voice.Today SLP worked with pt on AB at rest and she req'd usual min-mod A faded to occasional min A at rest and with vowel vocalization. SLP told pt to cont to practice AB 15 minutes BID, and complete PhoRTE.   03/02/24:  SLP emailed pt links and  instructions for PhoRTE. Today SLP educated about rationale for PhoRTE and then demo'd each exercise for pt and ensured she performed correctly prior to moving to next exercise. Pt req'd consistent model for all exercises, and additional usual mod A for numbers (immediate drop off from high to low pitch instead of gradual drop off). SLP stressed pt must complete BID for 6-8 weeks for best chance to reduce glottal gap due to vocal fold atrophy. Pt was encouraged to cont with AB practice, and use of strategies for minimizing effect of cough/throat clear. Pt cleared throat x2 today.   02/12/24: Consider PhoRTE next session. One throat clear today. Pt with inconsistent practice with AB due to family and holiday schedule in the last two weeks. Chanette feels like throat clear/cough has increased in frequency since last session. SLP educated pt (re-educated pt) on triggers and heightened emotion can be one trigger.  SLP encouraged pt to put some time into practicing AB and historically this has helped patients greatly. SLP reviewed visuatlization strategies and laryngeal control strategies with pt. SLP reiterated the use of written cues for each on a 4x6 card for practice and for cues when pt is in a spell. Pt is confident she can find time to practice AB between now and  her next appointment. She thought she could do this from now until December 11th (her appointment next week). SLP agreed.  02/01/24: SLP worked with pt on AB and extending the breath to 4 seconds. Pt with initial cues necessary but independent with this after initial cues. SLP told pt to practice AB with 4 second inhale and 4 second exhale 12-15 minutes and then periodically throughout the day. Reviewed cough suppression strategies with pt and pt practiced with occasional min A necessary from SLP for procedure. SLP also reviewed relaxation/visualization strategies and reiterated these will assist with cough or throat clear due to VCD. 6 throat clears today, but only 2 the last 34 minutes after SLP reviewed throat clear alternatives with pt 7 minutes into session.  01/11/24: SLP discussion with pt about caffeine intake - pt is having approx 100 mg/daily using a drink mix. SLP suggested pt decr this due to drying effect on vocal folds. Pt already taking Singulair  and Claritin  daily. SLP told pt goal is 50mg  daily. Three throat clears today.  SLP then worked with AB with pt today at rest. Pt with 95% success with demo by SLP initially. SLP told pt to practice this 10-25 minutes BID. SLP introduced pt to throat clear alternatives and provided handout (see pt instructions). SLP used demonstration and pt return demonstrated.   01/08/24: AB and throat clear alternatives next session. Today SLP educated pt about cough suppression strategies, provided handout. Pt to practice approx 5-10 minutes once/twice a day. SLP shared with pt about other visualizations - melting/evaporating, and larynx expanding like a grapefruit. Handout provided. SLP guided pt through brief practice with AB - will continue this next session.   01/03/24: Needs cough suppression strategies, visualization, and AB next session. SLP educated pt about VCD as well as glottic insufficiency. Explained PhoRTE eventually, and also worked with pt re:  generating a visual image of relaxation for pt to use when she feels sx VCD begin. Lastly SLP taught pt 'sniff sniff - easy exhale with pursed lips' strategy for cough suppression.   PATIENT EDUCATION: Education details: see Treatment date Person educated: Patient Education method: Explanation, Demonstration, Verbal cues, and Handouts Education comprehension: verbalized understanding, returned demonstration, verbal cues required, and  needs further education  HOME EXERCISE PROGRAM: Eventually PhoRTE and AB, and visualization, and cough suppression   GOALS: Goals reviewed with patient? Yes  SHORT TERM GOALS: Target date: 02/09/24 (due to pt scheduling needs)  Pt will tell SLP of two breathing strategies, and two relaxation strategies to use for VCD in two sessions Baseline:02/12/24 Goal status: partially met  2.  Pt will decr caffeine intake to 50mg  to improve vocal hygiene Baseline:  Goal status: not met  3.  Pt will decr throat clears to < 3/ session x 2 sessions Baseline: 02/12/24 Goal status: partially met  4.  Pt will tell SLP 4 throat clear alternatives in 2 sessions Baseline:  Goal status: not met  5.  Pt will perform PhoRTE with rare min A in two sessions Baseline:  Goal status: deferred - not done yet   LONG TERM GOALS: Target date:  05/17/24  Pt will improve PROMs Baseline:  Goal status: INITIAL  2.  Pt will decr throat clears to <2 in two sessions Baseline:  Goal status: INITIAL  3.  Pt will decr caffeine intake to 30mg  to improve vocal hygiene Baseline: 03/27/24 Goal status: INITIAL  4.  Pt will perform PhoRTE with modified independence in two sessions Baseline:  Goal status: INITIAL  5.  Pt will report improvement as less frequent s/sx VCD than prior to ST Baseline:  Goal status: INITIAL  6.  Pt will use AB 80% in 10 minutes conversation, in two sessions Baseline:  Goal status: INITIAL  ASSESSMENT:  CLINICAL IMPRESSION: RECERT TODAY.  Patient is a 69 y.o. F who was seen today for treatment of voice in light of both glottic insufficiency and VCD. Pt has had limited visits in 8 calendar weeks from evaluation date. See treatment date above for today's date for further details on today's session. During eval, pt cleared throat 10 times in 38-minutes. She will benefit further from ST by working to meet goals above for VCD and for glottal insufficiency.  OBJECTIVE IMPAIRMENTS: include voice disorder. These impairments are limiting patient from household responsibilities, ADLs/IADLs, and effectively communicating at home and in community. Factors affecting potential to achieve goals and functional outcome are time post onset. Patient will benefit from skilled SLP services to address above impairments and improve overall function.  REHAB POTENTIAL: Good  PLAN:  SLP FREQUENCY: 1-2x/week  SLP DURATION: 19 total sessions  PLANNED INTERVENTIONS: Environmental controls, Internal/external aids, Oral motor exercises, Functional tasks, SLP instruction and feedback, Compensatory strategies, Patient/family education, [phone] Treatment of speech (30 or 45 min) , and voice exercises    [employer], M.A., CF-SLP 04/19/2024, 12:22 PM      "

## 2024-04-19 ENCOUNTER — Other Ambulatory Visit: Payer: Self-pay

## 2024-04-22 ENCOUNTER — Encounter

## 2024-04-22 ENCOUNTER — Ambulatory Visit

## 2024-04-24 ENCOUNTER — Ambulatory Visit: Admitting: Primary Care

## 2024-04-24 ENCOUNTER — Ambulatory Visit (INDEPENDENT_AMBULATORY_CARE_PROVIDER_SITE_OTHER): Admitting: Family Medicine

## 2024-04-25 ENCOUNTER — Ambulatory Visit

## 2024-04-29 ENCOUNTER — Ambulatory Visit

## 2024-05-02 ENCOUNTER — Ambulatory Visit

## 2024-05-06 ENCOUNTER — Ambulatory Visit

## 2024-05-09 ENCOUNTER — Ambulatory Visit

## 2024-06-24 ENCOUNTER — Telehealth: Admitting: Diagnostic Neuroimaging

## 2025-01-10 ENCOUNTER — Encounter: Admitting: Internal Medicine

## 2025-01-10 ENCOUNTER — Ambulatory Visit
